# Patient Record
Sex: Male | Born: 1935
Health system: Southern US, Community
[De-identification: ages and names within clinical notes are randomized; demographics above are authoritative.]

## PROBLEM LIST (undated history)

## (undated) DIAGNOSIS — E119 Type 2 diabetes mellitus without complications: Secondary | ICD-10-CM

## (undated) DIAGNOSIS — N400 Enlarged prostate without lower urinary tract symptoms: Secondary | ICD-10-CM

## (undated) DIAGNOSIS — Z8719 Personal history of other diseases of the digestive system: Secondary | ICD-10-CM

## (undated) DIAGNOSIS — K5790 Diverticulosis of intestine, part unspecified, without perforation or abscess without bleeding: Secondary | ICD-10-CM

## (undated) DIAGNOSIS — E1140 Type 2 diabetes mellitus with diabetic neuropathy, unspecified: Secondary | ICD-10-CM

## (undated) DIAGNOSIS — E785 Hyperlipidemia, unspecified: Secondary | ICD-10-CM

## (undated) DIAGNOSIS — T7840XA Allergy, unspecified, initial encounter: Secondary | ICD-10-CM

## (undated) DIAGNOSIS — N189 Chronic kidney disease, unspecified: Secondary | ICD-10-CM

## (undated) DIAGNOSIS — K219 Gastro-esophageal reflux disease without esophagitis: Secondary | ICD-10-CM

## (undated) DIAGNOSIS — K56609 Unspecified intestinal obstruction, unspecified as to partial versus complete obstruction: Secondary | ICD-10-CM

## (undated) DIAGNOSIS — B351 Tinea unguium: Secondary | ICD-10-CM

## (undated) DIAGNOSIS — I10 Essential (primary) hypertension: Secondary | ICD-10-CM

## (undated) DIAGNOSIS — N399 Disorder of urinary system, unspecified: Secondary | ICD-10-CM

## (undated) DIAGNOSIS — K5730 Diverticulosis of large intestine without perforation or abscess without bleeding: Secondary | ICD-10-CM

## (undated) DIAGNOSIS — H409 Unspecified glaucoma: Secondary | ICD-10-CM

## (undated) DIAGNOSIS — R569 Unspecified convulsions: Secondary | ICD-10-CM

## (undated) DIAGNOSIS — K649 Unspecified hemorrhoids: Secondary | ICD-10-CM

## (undated) DIAGNOSIS — E7800 Pure hypercholesterolemia, unspecified: Secondary | ICD-10-CM

## (undated) DIAGNOSIS — I219 Acute myocardial infarction, unspecified: Secondary | ICD-10-CM

## (undated) DIAGNOSIS — E669 Obesity, unspecified: Secondary | ICD-10-CM

## (undated) HISTORY — DX: Pure hypercholesterolemia, unspecified: E78.00

## (undated) HISTORY — PX: HERNIA REPAIR: SHX51

## (undated) HISTORY — DX: Disorder of urinary system, unspecified: N39.9

## (undated) HISTORY — DX: Allergy, unspecified, initial encounter: T78.40XA

## (undated) HISTORY — DX: Type 2 diabetes mellitus without complications: E11.9

## (undated) HISTORY — PX: APPENDECTOMY: SHX54

## (undated) HISTORY — PX: EYE SURGERY: SHX253

## (undated) HISTORY — PX: COLONOSCOPY W/ POLYPECTOMY: SHX1380

## (undated) HISTORY — PX: INGUINAL HERNIA REPAIR: SHX194

## (undated) HISTORY — PX: GLAUCOMA SURGERY: SHX656

## (undated) HISTORY — PX: COLON SURGERY: SHX602

## (undated) HISTORY — PX: HEMORROIDECTOMY: SUR656

## (undated) HISTORY — PX: RETINAL DETACHMENT SURGERY: SHX105

## (undated) HISTORY — DX: Essential (primary) hypertension: I10

## (undated) HISTORY — DX: Diverticulosis of large intestine without perforation or abscess without bleeding: K57.30

## (undated) HISTORY — DX: Unspecified intestinal obstruction, unspecified as to partial versus complete obstruction: K56.609

## (undated) HISTORY — PX: REPLACEMENT TOTAL KNEE: SUR1224

## (undated) HISTORY — PX: TONSILLECTOMY: SUR1361

---

## 1979-02-14 DIAGNOSIS — K5730 Diverticulosis of large intestine without perforation or abscess without bleeding: Secondary | ICD-10-CM

## 1979-02-14 HISTORY — DX: Diverticulosis of large intestine without perforation or abscess without bleeding: K57.30

## 1988-06-15 HISTORY — PX: LARYNX SURGERY: SHX692

## 1989-02-13 DIAGNOSIS — K56609 Unspecified intestinal obstruction, unspecified as to partial versus complete obstruction: Secondary | ICD-10-CM

## 1989-02-13 HISTORY — DX: Unspecified intestinal obstruction, unspecified as to partial versus complete obstruction: K56.609

## 2004-06-27 ENCOUNTER — Ambulatory Visit: Payer: Self-pay

## 2005-07-09 ENCOUNTER — Inpatient Hospital Stay: Payer: Self-pay | Admitting: Gastroenterology

## 2005-08-01 ENCOUNTER — Ambulatory Visit: Payer: Self-pay | Admitting: Gastroenterology

## 2007-02-03 DIAGNOSIS — I10 Essential (primary) hypertension: Secondary | ICD-10-CM | POA: Insufficient documentation

## 2007-02-03 DIAGNOSIS — E1159 Type 2 diabetes mellitus with other circulatory complications: Secondary | ICD-10-CM | POA: Insufficient documentation

## 2007-09-13 ENCOUNTER — Ambulatory Visit: Payer: Self-pay | Admitting: Family Medicine

## 2008-02-07 DIAGNOSIS — K2100 Gastro-esophageal reflux disease with esophagitis, without bleeding: Secondary | ICD-10-CM | POA: Insufficient documentation

## 2008-09-24 ENCOUNTER — Ambulatory Visit: Payer: Self-pay | Admitting: Gastroenterology

## 2010-03-18 DIAGNOSIS — M8580 Other specified disorders of bone density and structure, unspecified site: Secondary | ICD-10-CM | POA: Insufficient documentation

## 2010-03-19 ENCOUNTER — Ambulatory Visit: Payer: Self-pay | Admitting: Family Medicine

## 2011-06-16 HISTORY — PX: OTHER SURGICAL HISTORY: SHX169

## 2011-06-16 HISTORY — PX: URETER SURGERY: SHX823

## 2011-06-16 HISTORY — PX: BLADDER DIVERTICULECTOMY: SHX1235

## 2011-08-31 DIAGNOSIS — M461 Sacroiliitis, not elsewhere classified: Secondary | ICD-10-CM | POA: Diagnosis not present

## 2011-08-31 DIAGNOSIS — IMO0002 Reserved for concepts with insufficient information to code with codable children: Secondary | ICD-10-CM | POA: Diagnosis not present

## 2011-09-07 DIAGNOSIS — N138 Other obstructive and reflux uropathy: Secondary | ICD-10-CM | POA: Diagnosis not present

## 2011-09-07 DIAGNOSIS — N323 Diverticulum of bladder: Secondary | ICD-10-CM | POA: Diagnosis not present

## 2011-09-07 DIAGNOSIS — N401 Enlarged prostate with lower urinary tract symptoms: Secondary | ICD-10-CM | POA: Diagnosis not present

## 2011-09-07 DIAGNOSIS — R338 Other retention of urine: Secondary | ICD-10-CM | POA: Diagnosis not present

## 2011-09-18 ENCOUNTER — Ambulatory Visit: Payer: Self-pay | Admitting: Urology

## 2011-09-18 DIAGNOSIS — R935 Abnormal findings on diagnostic imaging of other abdominal regions, including retroperitoneum: Secondary | ICD-10-CM | POA: Diagnosis not present

## 2011-09-18 DIAGNOSIS — N323 Diverticulum of bladder: Secondary | ICD-10-CM | POA: Diagnosis not present

## 2011-09-18 DIAGNOSIS — N210 Calculus in bladder: Secondary | ICD-10-CM | POA: Diagnosis not present

## 2011-09-21 DIAGNOSIS — N138 Other obstructive and reflux uropathy: Secondary | ICD-10-CM | POA: Diagnosis not present

## 2011-09-21 DIAGNOSIS — R338 Other retention of urine: Secondary | ICD-10-CM | POA: Diagnosis not present

## 2011-09-21 DIAGNOSIS — N323 Diverticulum of bladder: Secondary | ICD-10-CM | POA: Diagnosis not present

## 2011-09-21 DIAGNOSIS — N401 Enlarged prostate with lower urinary tract symptoms: Secondary | ICD-10-CM | POA: Diagnosis not present

## 2011-09-24 ENCOUNTER — Ambulatory Visit: Payer: Self-pay | Admitting: Urology

## 2011-09-24 DIAGNOSIS — Z01810 Encounter for preprocedural cardiovascular examination: Secondary | ICD-10-CM | POA: Diagnosis not present

## 2011-09-24 DIAGNOSIS — I119 Hypertensive heart disease without heart failure: Secondary | ICD-10-CM | POA: Diagnosis not present

## 2011-09-24 DIAGNOSIS — N323 Diverticulum of bladder: Secondary | ICD-10-CM | POA: Diagnosis not present

## 2011-09-24 DIAGNOSIS — Z01812 Encounter for preprocedural laboratory examination: Secondary | ICD-10-CM | POA: Diagnosis not present

## 2011-09-24 LAB — BASIC METABOLIC PANEL
Anion Gap: 10 (ref 7–16)
Chloride: 97 mmol/L — ABNORMAL LOW (ref 98–107)
Co2: 27 mmol/L (ref 21–32)
Creatinine: 0.72 mg/dL (ref 0.60–1.30)
EGFR (African American): >60
Glucose: 474 mg/dL — ABNORMAL HIGH (ref 65–99)

## 2011-11-04 LAB — HM COLONOSCOPY: HM Colonoscopy: ABNORMAL

## 2011-11-10 ENCOUNTER — Ambulatory Visit: Payer: Self-pay | Admitting: Gastroenterology

## 2011-11-10 DIAGNOSIS — Z860100 Personal history of colon polyps, unspecified: Secondary | ICD-10-CM | POA: Diagnosis not present

## 2011-11-10 DIAGNOSIS — E119 Type 2 diabetes mellitus without complications: Secondary | ICD-10-CM | POA: Diagnosis not present

## 2011-11-10 DIAGNOSIS — Z79899 Other long term (current) drug therapy: Secondary | ICD-10-CM | POA: Diagnosis not present

## 2011-11-10 DIAGNOSIS — K5730 Diverticulosis of large intestine without perforation or abscess without bleeding: Secondary | ICD-10-CM | POA: Diagnosis not present

## 2011-11-10 DIAGNOSIS — Z980 Intestinal bypass and anastomosis status: Secondary | ICD-10-CM | POA: Diagnosis not present

## 2011-11-10 DIAGNOSIS — K219 Gastro-esophageal reflux disease without esophagitis: Secondary | ICD-10-CM | POA: Diagnosis not present

## 2011-11-10 DIAGNOSIS — H409 Unspecified glaucoma: Secondary | ICD-10-CM | POA: Diagnosis not present

## 2011-11-10 DIAGNOSIS — Z800 Family history of malignant neoplasm of digestive organs: Secondary | ICD-10-CM | POA: Diagnosis not present

## 2011-11-10 DIAGNOSIS — Z09 Encounter for follow-up examination after completed treatment for conditions other than malignant neoplasm: Secondary | ICD-10-CM | POA: Diagnosis not present

## 2011-11-10 DIAGNOSIS — I10 Essential (primary) hypertension: Secondary | ICD-10-CM | POA: Diagnosis not present

## 2011-11-10 DIAGNOSIS — E7800 Pure hypercholesterolemia, unspecified: Secondary | ICD-10-CM | POA: Diagnosis not present

## 2011-11-20 DIAGNOSIS — IMO0001 Reserved for inherently not codable concepts without codable children: Secondary | ICD-10-CM | POA: Diagnosis not present

## 2011-11-20 DIAGNOSIS — I10 Essential (primary) hypertension: Secondary | ICD-10-CM | POA: Diagnosis not present

## 2011-11-20 DIAGNOSIS — E7800 Pure hypercholesterolemia, unspecified: Secondary | ICD-10-CM | POA: Diagnosis not present

## 2011-12-04 DIAGNOSIS — E119 Type 2 diabetes mellitus without complications: Secondary | ICD-10-CM | POA: Diagnosis not present

## 2012-01-13 DIAGNOSIS — H2700 Aphakia, unspecified eye: Secondary | ICD-10-CM | POA: Diagnosis not present

## 2012-01-13 DIAGNOSIS — H4052X3 Glaucoma secondary to other eye disorders, left eye, severe stage: Secondary | ICD-10-CM | POA: Insufficient documentation

## 2012-01-13 DIAGNOSIS — H4050X0 Glaucoma secondary to other eye disorders, unspecified eye, stage unspecified: Secondary | ICD-10-CM | POA: Diagnosis not present

## 2012-02-08 DIAGNOSIS — R490 Dysphonia: Secondary | ICD-10-CM | POA: Insufficient documentation

## 2012-02-08 DIAGNOSIS — J387 Other diseases of larynx: Secondary | ICD-10-CM | POA: Diagnosis not present

## 2012-02-08 DIAGNOSIS — K219 Gastro-esophageal reflux disease without esophagitis: Secondary | ICD-10-CM | POA: Diagnosis not present

## 2012-02-10 ENCOUNTER — Ambulatory Visit: Payer: Self-pay | Admitting: Urology

## 2012-02-12 DIAGNOSIS — N323 Diverticulum of bladder: Secondary | ICD-10-CM | POA: Insufficient documentation

## 2012-02-16 ENCOUNTER — Inpatient Hospital Stay: Payer: Self-pay | Admitting: Urology

## 2012-02-16 DIAGNOSIS — I10 Essential (primary) hypertension: Secondary | ICD-10-CM | POA: Diagnosis not present

## 2012-02-16 DIAGNOSIS — Z79899 Other long term (current) drug therapy: Secondary | ICD-10-CM | POA: Diagnosis not present

## 2012-02-16 DIAGNOSIS — H409 Unspecified glaucoma: Secondary | ICD-10-CM | POA: Diagnosis present

## 2012-02-16 DIAGNOSIS — K219 Gastro-esophageal reflux disease without esophagitis: Secondary | ICD-10-CM | POA: Diagnosis present

## 2012-02-16 DIAGNOSIS — Z9049 Acquired absence of other specified parts of digestive tract: Secondary | ICD-10-CM | POA: Diagnosis not present

## 2012-02-16 DIAGNOSIS — Z881 Allergy status to other antibiotic agents status: Secondary | ICD-10-CM | POA: Diagnosis not present

## 2012-02-16 DIAGNOSIS — N323 Diverticulum of bladder: Secondary | ICD-10-CM | POA: Diagnosis not present

## 2012-02-16 DIAGNOSIS — Z880 Allergy status to penicillin: Secondary | ICD-10-CM | POA: Diagnosis not present

## 2012-02-16 DIAGNOSIS — E119 Type 2 diabetes mellitus without complications: Secondary | ICD-10-CM | POA: Diagnosis not present

## 2012-02-16 DIAGNOSIS — Z885 Allergy status to narcotic agent status: Secondary | ICD-10-CM | POA: Diagnosis not present

## 2012-02-16 DIAGNOSIS — N400 Enlarged prostate without lower urinary tract symptoms: Secondary | ICD-10-CM | POA: Diagnosis present

## 2012-02-17 LAB — CBC WITH DIFFERENTIAL/PLATELET
Basophil #: 0 10*3/uL (ref 0.0–0.1)
Eosinophil %: 2.3 %
HCT: 35.8 % — ABNORMAL LOW (ref 40.0–52.0)
HGB: 12.2 g/dL — ABNORMAL LOW (ref 13.0–18.0)
Lymphocyte #: 1.1 10*3/uL (ref 1.0–3.6)
MCH: 35 pg — ABNORMAL HIGH (ref 26.0–34.0)
MCV: 103 fL — ABNORMAL HIGH (ref 80–100)
Monocyte #: 0.7 x10 3/mm (ref 0.2–1.0)
Monocyte %: 11 %
Neutrophil #: 4.8 10*3/uL (ref 1.4–6.5)
Neutrophil %: 70.7 %
Platelet: 124 10*3/uL — ABNORMAL LOW (ref 150–440)
RDW: 12.7 % (ref 11.5–14.5)

## 2012-02-17 LAB — MAGNESIUM: Magnesium: 1.6 mg/dL — ABNORMAL LOW

## 2012-02-17 LAB — BASIC METABOLIC PANEL
Calcium, Total: 7.6 mg/dL — ABNORMAL LOW (ref 8.5–10.1)
Chloride: 109 mmol/L — ABNORMAL HIGH (ref 98–107)
Co2: 30 mmol/L (ref 21–32)
Creatinine: 0.82 mg/dL (ref 0.60–1.30)
Potassium: 3.8 mmol/L (ref 3.5–5.1)
Sodium: 143 mmol/L (ref 136–145)

## 2012-02-19 LAB — PATHOLOGY REPORT

## 2012-03-03 DIAGNOSIS — Z23 Encounter for immunization: Secondary | ICD-10-CM | POA: Diagnosis not present

## 2012-03-08 DIAGNOSIS — G5600 Carpal tunnel syndrome, unspecified upper limb: Secondary | ICD-10-CM | POA: Diagnosis not present

## 2012-03-08 DIAGNOSIS — IMO0002 Reserved for concepts with insufficient information to code with codable children: Secondary | ICD-10-CM | POA: Diagnosis not present

## 2012-03-16 DIAGNOSIS — R338 Other retention of urine: Secondary | ICD-10-CM | POA: Diagnosis not present

## 2012-03-16 DIAGNOSIS — N390 Urinary tract infection, site not specified: Secondary | ICD-10-CM | POA: Diagnosis not present

## 2012-03-16 DIAGNOSIS — N138 Other obstructive and reflux uropathy: Secondary | ICD-10-CM | POA: Diagnosis not present

## 2012-03-22 DIAGNOSIS — G5600 Carpal tunnel syndrome, unspecified upper limb: Secondary | ICD-10-CM | POA: Diagnosis not present

## 2012-03-22 DIAGNOSIS — IMO0002 Reserved for concepts with insufficient information to code with codable children: Secondary | ICD-10-CM | POA: Diagnosis not present

## 2012-04-04 DIAGNOSIS — E119 Type 2 diabetes mellitus without complications: Secondary | ICD-10-CM | POA: Diagnosis not present

## 2012-04-04 DIAGNOSIS — T190XXA Foreign body in urethra, initial encounter: Secondary | ICD-10-CM | POA: Diagnosis not present

## 2012-04-04 DIAGNOSIS — N323 Diverticulum of bladder: Secondary | ICD-10-CM | POA: Diagnosis not present

## 2012-04-04 DIAGNOSIS — E7800 Pure hypercholesterolemia, unspecified: Secondary | ICD-10-CM | POA: Diagnosis not present

## 2012-04-04 DIAGNOSIS — N390 Urinary tract infection, site not specified: Secondary | ICD-10-CM | POA: Diagnosis not present

## 2012-04-04 DIAGNOSIS — Z23 Encounter for immunization: Secondary | ICD-10-CM | POA: Diagnosis not present

## 2012-04-08 DIAGNOSIS — Z8669 Personal history of other diseases of the nervous system and sense organs: Secondary | ICD-10-CM | POA: Insufficient documentation

## 2012-04-08 DIAGNOSIS — H31019 Macula scars of posterior pole (postinflammatory) (post-traumatic), unspecified eye: Secondary | ICD-10-CM | POA: Diagnosis not present

## 2012-04-08 DIAGNOSIS — Z961 Presence of intraocular lens: Secondary | ICD-10-CM | POA: Insufficient documentation

## 2012-04-08 DIAGNOSIS — H4011X0 Primary open-angle glaucoma, stage unspecified: Secondary | ICD-10-CM | POA: Diagnosis not present

## 2012-04-08 DIAGNOSIS — H5231 Anisometropia: Secondary | ICD-10-CM | POA: Diagnosis not present

## 2012-04-08 DIAGNOSIS — H2640 Unspecified secondary cataract: Secondary | ICD-10-CM | POA: Diagnosis not present

## 2012-04-08 DIAGNOSIS — H4050X0 Glaucoma secondary to other eye disorders, unspecified eye, stage unspecified: Secondary | ICD-10-CM | POA: Diagnosis not present

## 2012-04-08 DIAGNOSIS — H2700 Aphakia, unspecified eye: Secondary | ICD-10-CM | POA: Diagnosis not present

## 2012-04-08 DIAGNOSIS — H11449 Conjunctival cysts, unspecified eye: Secondary | ICD-10-CM | POA: Diagnosis not present

## 2012-04-08 DIAGNOSIS — H409 Unspecified glaucoma: Secondary | ICD-10-CM | POA: Diagnosis not present

## 2012-04-08 HISTORY — DX: Personal history of other diseases of the nervous system and sense organs: Z86.69

## 2012-04-11 ENCOUNTER — Ambulatory Visit: Payer: Self-pay | Admitting: Specialist

## 2012-04-11 DIAGNOSIS — G5600 Carpal tunnel syndrome, unspecified upper limb: Secondary | ICD-10-CM | POA: Diagnosis not present

## 2012-04-11 DIAGNOSIS — Z01812 Encounter for preprocedural laboratory examination: Secondary | ICD-10-CM | POA: Diagnosis not present

## 2012-04-11 LAB — BASIC METABOLIC PANEL
Anion Gap: 7 (ref 7–16)
BUN: 17 mg/dL (ref 7–18)
Calcium, Total: 8.7 mg/dL (ref 8.5–10.1)
Creatinine: 0.58 mg/dL — ABNORMAL LOW (ref 0.60–1.30)
EGFR (African American): >60
EGFR (Non-African Amer.): >60
Glucose: 87 mg/dL (ref 65–99)
Potassium: 3.7 mmol/L (ref 3.5–5.1)
Sodium: 142 mmol/L (ref 136–145)

## 2012-04-21 ENCOUNTER — Ambulatory Visit: Payer: Self-pay | Admitting: Specialist

## 2012-04-21 DIAGNOSIS — Z885 Allergy status to narcotic agent status: Secondary | ICD-10-CM | POA: Diagnosis not present

## 2012-04-21 DIAGNOSIS — M129 Arthropathy, unspecified: Secondary | ICD-10-CM | POA: Diagnosis not present

## 2012-04-21 DIAGNOSIS — K219 Gastro-esophageal reflux disease without esophagitis: Secondary | ICD-10-CM | POA: Diagnosis not present

## 2012-04-21 DIAGNOSIS — E119 Type 2 diabetes mellitus without complications: Secondary | ICD-10-CM | POA: Diagnosis not present

## 2012-04-21 DIAGNOSIS — H4089 Other specified glaucoma: Secondary | ICD-10-CM | POA: Diagnosis not present

## 2012-04-21 DIAGNOSIS — Z9049 Acquired absence of other specified parts of digestive tract: Secondary | ICD-10-CM | POA: Diagnosis not present

## 2012-04-21 DIAGNOSIS — Z880 Allergy status to penicillin: Secondary | ICD-10-CM | POA: Diagnosis not present

## 2012-04-21 DIAGNOSIS — G5600 Carpal tunnel syndrome, unspecified upper limb: Secondary | ICD-10-CM | POA: Diagnosis not present

## 2012-04-21 DIAGNOSIS — Z79899 Other long term (current) drug therapy: Secondary | ICD-10-CM | POA: Diagnosis not present

## 2012-04-21 DIAGNOSIS — Z888 Allergy status to other drugs, medicaments and biological substances status: Secondary | ICD-10-CM | POA: Diagnosis not present

## 2012-04-21 DIAGNOSIS — I10 Essential (primary) hypertension: Secondary | ICD-10-CM | POA: Diagnosis not present

## 2012-04-21 DIAGNOSIS — D649 Anemia, unspecified: Secondary | ICD-10-CM | POA: Diagnosis not present

## 2012-04-28 ENCOUNTER — Ambulatory Visit: Payer: Self-pay | Admitting: Urology

## 2012-04-28 DIAGNOSIS — N323 Diverticulum of bladder: Secondary | ICD-10-CM | POA: Diagnosis not present

## 2012-04-28 DIAGNOSIS — N401 Enlarged prostate with lower urinary tract symptoms: Secondary | ICD-10-CM | POA: Diagnosis not present

## 2012-04-28 DIAGNOSIS — E119 Type 2 diabetes mellitus without complications: Secondary | ICD-10-CM | POA: Diagnosis not present

## 2012-04-28 DIAGNOSIS — N138 Other obstructive and reflux uropathy: Secondary | ICD-10-CM | POA: Diagnosis not present

## 2012-04-28 DIAGNOSIS — R338 Other retention of urine: Secondary | ICD-10-CM | POA: Diagnosis not present

## 2012-04-28 LAB — CREATININE, SERUM: EGFR (African American): >60

## 2012-04-28 LAB — BUN: BUN: 15 mg/dL (ref 7–18)

## 2012-05-02 DIAGNOSIS — R339 Retention of urine, unspecified: Secondary | ICD-10-CM | POA: Diagnosis not present

## 2012-05-02 DIAGNOSIS — G5600 Carpal tunnel syndrome, unspecified upper limb: Secondary | ICD-10-CM | POA: Diagnosis not present

## 2012-05-05 ENCOUNTER — Ambulatory Visit: Payer: Self-pay | Admitting: Specialist

## 2012-05-05 DIAGNOSIS — Z888 Allergy status to other drugs, medicaments and biological substances status: Secondary | ICD-10-CM | POA: Diagnosis not present

## 2012-05-05 DIAGNOSIS — D649 Anemia, unspecified: Secondary | ICD-10-CM | POA: Diagnosis not present

## 2012-05-05 DIAGNOSIS — Z881 Allergy status to other antibiotic agents status: Secondary | ICD-10-CM | POA: Diagnosis not present

## 2012-05-05 DIAGNOSIS — I10 Essential (primary) hypertension: Secondary | ICD-10-CM | POA: Diagnosis not present

## 2012-05-05 DIAGNOSIS — K219 Gastro-esophageal reflux disease without esophagitis: Secondary | ICD-10-CM | POA: Diagnosis not present

## 2012-05-05 DIAGNOSIS — M129 Arthropathy, unspecified: Secondary | ICD-10-CM | POA: Diagnosis not present

## 2012-05-05 DIAGNOSIS — Z885 Allergy status to narcotic agent status: Secondary | ICD-10-CM | POA: Diagnosis not present

## 2012-05-05 DIAGNOSIS — G5600 Carpal tunnel syndrome, unspecified upper limb: Secondary | ICD-10-CM | POA: Diagnosis not present

## 2012-05-05 DIAGNOSIS — E119 Type 2 diabetes mellitus without complications: Secondary | ICD-10-CM | POA: Diagnosis not present

## 2012-05-05 DIAGNOSIS — Z880 Allergy status to penicillin: Secondary | ICD-10-CM | POA: Diagnosis not present

## 2012-05-05 DIAGNOSIS — H409 Unspecified glaucoma: Secondary | ICD-10-CM | POA: Diagnosis not present

## 2012-05-06 DIAGNOSIS — G5600 Carpal tunnel syndrome, unspecified upper limb: Secondary | ICD-10-CM | POA: Diagnosis not present

## 2012-05-19 DIAGNOSIS — G5600 Carpal tunnel syndrome, unspecified upper limb: Secondary | ICD-10-CM | POA: Diagnosis not present

## 2012-07-15 DIAGNOSIS — H4050X0 Glaucoma secondary to other eye disorders, unspecified eye, stage unspecified: Secondary | ICD-10-CM | POA: Diagnosis not present

## 2012-07-15 DIAGNOSIS — H353190 Nonexudative age-related macular degeneration, unspecified eye, stage unspecified: Secondary | ICD-10-CM | POA: Insufficient documentation

## 2012-07-15 DIAGNOSIS — Z8669 Personal history of other diseases of the nervous system and sense organs: Secondary | ICD-10-CM | POA: Diagnosis not present

## 2012-07-15 DIAGNOSIS — H2700 Aphakia, unspecified eye: Secondary | ICD-10-CM | POA: Diagnosis not present

## 2012-07-15 DIAGNOSIS — H409 Unspecified glaucoma: Secondary | ICD-10-CM | POA: Diagnosis not present

## 2012-08-09 DIAGNOSIS — E7800 Pure hypercholesterolemia, unspecified: Secondary | ICD-10-CM | POA: Diagnosis not present

## 2012-08-09 DIAGNOSIS — I10 Essential (primary) hypertension: Secondary | ICD-10-CM | POA: Diagnosis not present

## 2012-08-09 DIAGNOSIS — E119 Type 2 diabetes mellitus without complications: Secondary | ICD-10-CM | POA: Diagnosis not present

## 2012-08-12 DIAGNOSIS — H3530 Unspecified macular degeneration: Secondary | ICD-10-CM | POA: Diagnosis not present

## 2012-08-12 DIAGNOSIS — Z9889 Other specified postprocedural states: Secondary | ICD-10-CM | POA: Diagnosis not present

## 2012-08-12 DIAGNOSIS — H4420 Degenerative myopia, unspecified eye: Secondary | ICD-10-CM | POA: Diagnosis not present

## 2012-08-12 DIAGNOSIS — Z961 Presence of intraocular lens: Secondary | ICD-10-CM | POA: Diagnosis not present

## 2012-08-12 DIAGNOSIS — H4423 Degenerative myopia, bilateral: Secondary | ICD-10-CM | POA: Insufficient documentation

## 2012-08-12 DIAGNOSIS — H409 Unspecified glaucoma: Secondary | ICD-10-CM | POA: Diagnosis not present

## 2012-08-12 DIAGNOSIS — H2700 Aphakia, unspecified eye: Secondary | ICD-10-CM | POA: Diagnosis not present

## 2012-08-12 DIAGNOSIS — H4011X0 Primary open-angle glaucoma, stage unspecified: Secondary | ICD-10-CM | POA: Diagnosis not present

## 2012-08-24 DIAGNOSIS — N401 Enlarged prostate with lower urinary tract symptoms: Secondary | ICD-10-CM | POA: Diagnosis not present

## 2012-08-24 DIAGNOSIS — N323 Diverticulum of bladder: Secondary | ICD-10-CM | POA: Diagnosis not present

## 2012-08-24 DIAGNOSIS — R338 Other retention of urine: Secondary | ICD-10-CM | POA: Diagnosis not present

## 2012-09-19 DIAGNOSIS — R338 Other retention of urine: Secondary | ICD-10-CM | POA: Diagnosis not present

## 2012-10-14 ENCOUNTER — Ambulatory Visit: Payer: Self-pay | Admitting: Family Medicine

## 2012-10-14 DIAGNOSIS — J84112 Idiopathic pulmonary fibrosis: Secondary | ICD-10-CM | POA: Diagnosis not present

## 2012-10-14 DIAGNOSIS — J8410 Pulmonary fibrosis, unspecified: Secondary | ICD-10-CM | POA: Diagnosis not present

## 2012-10-25 DIAGNOSIS — H4420 Degenerative myopia, unspecified eye: Secondary | ICD-10-CM | POA: Diagnosis not present

## 2012-10-25 DIAGNOSIS — Z961 Presence of intraocular lens: Secondary | ICD-10-CM | POA: Diagnosis not present

## 2012-10-25 DIAGNOSIS — H5231 Anisometropia: Secondary | ICD-10-CM | POA: Diagnosis not present

## 2012-10-25 DIAGNOSIS — Z8669 Personal history of other diseases of the nervous system and sense organs: Secondary | ICD-10-CM | POA: Diagnosis not present

## 2012-10-25 DIAGNOSIS — H2700 Aphakia, unspecified eye: Secondary | ICD-10-CM | POA: Diagnosis not present

## 2012-10-25 DIAGNOSIS — H4050X0 Glaucoma secondary to other eye disorders, unspecified eye, stage unspecified: Secondary | ICD-10-CM | POA: Diagnosis not present

## 2012-11-03 DIAGNOSIS — R338 Other retention of urine: Secondary | ICD-10-CM | POA: Diagnosis not present

## 2012-11-03 DIAGNOSIS — N401 Enlarged prostate with lower urinary tract symptoms: Secondary | ICD-10-CM | POA: Insufficient documentation

## 2012-11-03 DIAGNOSIS — N400 Enlarged prostate without lower urinary tract symptoms: Secondary | ICD-10-CM | POA: Insufficient documentation

## 2012-12-12 DIAGNOSIS — I10 Essential (primary) hypertension: Secondary | ICD-10-CM | POA: Diagnosis not present

## 2012-12-12 DIAGNOSIS — E119 Type 2 diabetes mellitus without complications: Secondary | ICD-10-CM | POA: Diagnosis not present

## 2012-12-12 DIAGNOSIS — E7800 Pure hypercholesterolemia, unspecified: Secondary | ICD-10-CM | POA: Diagnosis not present

## 2013-01-12 ENCOUNTER — Encounter: Payer: Self-pay | Admitting: General Surgery

## 2013-01-12 ENCOUNTER — Ambulatory Visit (INDEPENDENT_AMBULATORY_CARE_PROVIDER_SITE_OTHER): Payer: Medicare Other | Admitting: General Surgery

## 2013-01-12 VITALS — BP 144/82 | HR 58 | Resp 12 | Ht 75.0 in | Wt 231.0 lb

## 2013-01-12 DIAGNOSIS — K439 Ventral hernia without obstruction or gangrene: Secondary | ICD-10-CM

## 2013-01-12 NOTE — Patient Instructions (Addendum)
Hernia Repair with Laparoscope A hernia occurs when an internal organ pushes out through a weak spot in the belly (abdominal) wall muscles. Hernias most commonly occur in the groin and around the navel. Hernias can also occur through a cut by the surgeon (incision) after an abdominal operation. A hernia may be caused by:  Lifting heavy objects.  Prolonged coughing.  Straining to move your bowels. Hernias can often be pushed back into place (reduced). Most hernias tend to get worse over time. Problems occur when abdominal contents get stuck in the opening and the blood supply is blocked or impaired (incarcerated hernia). Because of these risks, you require surgery to repair the hernia. Your hernia will be repaired using a laparoscope. Laparoscopic surgery is a type of minimally invasive surgery. It does not involve making a typical surgical cut (incision) in the skin. A laparoscope is a telescope-like rod and lens system. It is usually connected to a video camera and a light source so your caregiver can clearly see the operative area. The instruments are inserted through  to  inch (5 mm or 10 mm) openings in the skin at specific locations. A working and viewing space is created by blowing a small amount of carbon dioxide gas into the abdominal cavity. The abdomen is essentially blown up like a balloon (insufflated). This elevates the abdominal wall above the internal organs like a dome. The carbon dioxide gas is common to the human body and can be absorbed by tissue and removed by the respiratory system. Once the repair is completed, the small incisions will be closed with either stitches (sutures) or staples (just like a paper stapler only this staple holds the skin together). LET YOUR CAREGIVERS KNOW ABOUT:  Allergies.  Medications taken including herbs, eye drops, over the counter medications, and creams.  Use of steroids (by mouth or creams).  Previous problems with anesthetics or  Novocaine.  Possibility of pregnancy, if this applies.  History of blood clots (thrombophlebitis).  History of bleeding or blood problems.  Previous surgery.  Other health problems. BEFORE THE PROCEDURE  Laparoscopy can be done either in a hospital or out-patient clinic. You may be given a mild sedative to help you relax before the procedure. Once in the operating room, you will be given a general anesthesia to make you sleep (unless you and your caregiver choose a different anesthetic).  AFTER THE PROCEDURE  After the procedure you will be watched in a recovery area. Depending on what type of hernia was repaired, you might be admitted to the hospital or you might go home the same day. With this procedure you may have less pain and scarring. This usually results in a quicker recovery and less risk of infection. HOME CARE INSTRUCTIONS   Bed rest is not required. You may continue your normal activities but avoid heavy lifting (more than 10 pounds) or straining.  Cough gently. If you are a smoker it is best to stop, as even the best hernia repair can break down with the continual strain of coughing.  Avoid driving until given the OK by your surgeon.  There are no dietary restrictions unless given otherwise.  TAKE ALL MEDICATIONS AS DIRECTED.  Only take over-the-counter or prescription medicines for pain, discomfort, or fever as directed by your caregiver. SEEK MEDICAL CARE IF:   There is increasing abdominal pain or pain in your incisions.  There is more bleeding from incisions, other than minimal spotting.  You feel light headed or faint.  You   develop an unexplained fever, chills, and/or an oral temperature above 102 F (38.9 C).  You have redness, swelling, or increasing pain in the wound.  Pus coming from wound.  A foul smell coming from the wound or dressings. SEEK IMMEDIATE MEDICAL CARE IF:   You develop a rash.  You have difficulty breathing.  You have any  allergic problems. MAKE SURE YOU:   Understand these instructions.  Will watch your condition.  Will get help right away if you are not doing well or get worse. Document Released: 06/01/2005 Document Revised: 08/24/2011 Document Reviewed: 05/01/2009 [facility] Patient Information 2014 [place], [place].  Patient has been scheduled for a CT abdomen without contrast (oral contrast only) at [facility] for 01-18-13 at 10:30 am (arrive 10:15 am). Prep: no solids 4 hours prior but patient may have clear liquids up until exam time, pick up prep kit, and take medication list. Patient verbalizes understanding.

## 2013-01-12 NOTE — Progress Notes (Signed)
Patient ID: Jeremy [name], male   DOB: [birth year], 77 y.o.   MRN: [id number]  Chief Complaint  Patient presents with  . Other    ventral hernia    HPI Jeremy [name] is a 77 y.o. male  Here for evaluation of ventral hernia. He had previously had a ventral repair done in the 1990's. This current area is above the repair, and has been there 6 months. He reports no pain, but he can feel the bowel under the skin.  HPI  Past Medical History  Diagnosis Date  . Small bowel obstruction 1990's  . Diabetes mellitus without complication   . Hypertension   . Diverticula, colon 1980's  . High cholesterol   . Urination disorder     Past Surgical History  Procedure Laterality Date  . Glaucoma surgery Left 1990's    [facility]  . Colon surgery Left 1980's  . Colonoscopy w/ polypectomy  1990's    Dr Okey Dupre  . Ureter surgery  2013    Dr Achilles Dunk  . Bladder diverticulectomy  2013  . Hemorroidectomy  1980's    [facility]  . Inguinal hernia repair Bilateral 1960's  . Larynx surgery  1990    3 times  . Hernia repair      [facility], Dr Okey Dupre    Family History  Problem Relation Age of Onset  . Diabetes Mother   . Cataracts Father   . Cataracts Mother   . Glaucoma Father   . Cancer Sister     gallbladder    Social History History  Substance Use Topics  . Smoking status: Never Smoker   . Smokeless tobacco: Never Used  . Alcohol Use: No    Allergies  Allergen Reactions  . Demerol (Meperidine) Other (See Comments)    hypotension  . Penicillins Rash    Current Outpatient Prescriptions  Medication Sig Dispense Refill  . Brimonidine Tartrate-Timolol (COMBIGAN OP) Apply to eye.      . brinzolamide (AZOPT) 1 % ophthalmic suspension 1 drop 2 (two) times daily.      Marland Kitchen esomeprazole (NEXIUM) 20 MG capsule Take 20 mg by mouth daily before breakfast.      . glimepiride (AMARYL) 2 MG tablet Take 2 mg by mouth daily before breakfast.      . metFORMIN (GLUCOPHAGE) 1000 MG tablet Take  1,000 mg by mouth 2 (two) times daily with a meal.      . ranitidine (ZANTAC) 150 MG tablet Take 150 mg by mouth at bedtime.      . rosuvastatin (CRESTOR) 20 MG tablet Take 20 mg by mouth daily.      . saxagliptin HCl (ONGLYZA) 5 MG TABS tablet Take 5 mg by mouth daily.      . tamsulosin (FLOMAX) 0.4 MG CAPS Take 0.4 mg by mouth 2 (two) times daily.       No current facility-administered medications for this visit.    Review of Systems Review of Systems  Constitutional: Negative.   Respiratory: Negative.   Cardiovascular: Negative.     Blood pressure 144/82, pulse 58, resp. rate 12, height 6\' 3"  (1.905 m), weight 231 lb (104.781 kg).  Physical Exam Physical Exam  Constitutional: He is oriented to person, place, and time. He appears well-developed and well-nourished.  Eyes: Conjunctivae are normal. No scleral icterus.  Cardiovascular: Normal rate, regular rhythm and normal heart sounds.   Pulmonary/Chest: Effort normal and breath sounds normal.  Abdominal: Soft. There is no tenderness. A hernia is  present. Hernia confirmed positive in the ventral area.    Neurological: He is alert and oriented to person, place, and time.    Data Reviewed none  Assessment    Ventral hernia. He has had prior repair in lower part with mesh. CT may help delineate extent of hernia.     Plan    Repair of hernia  electively.  Discussed with him.    Patient has been scheduled for a CT abdomen without contrast (oral contrast only) at [facility] for 01-18-13 at 10:30 am (arrive 10:15 am). Prep: no solids 4 hours prior but patient may have clear liquids up until exam time, pick up prep kit, and take medication list. Patient verbalizes understanding.   Kamylle Axelson G 01/13/2013, 6:01 AM

## 2013-01-13 ENCOUNTER — Encounter: Payer: Self-pay | Admitting: General Surgery

## 2013-01-18 ENCOUNTER — Ambulatory Visit: Payer: Self-pay | Admitting: General Surgery

## 2013-01-18 DIAGNOSIS — K469 Unspecified abdominal hernia without obstruction or gangrene: Secondary | ICD-10-CM | POA: Diagnosis not present

## 2013-01-18 DIAGNOSIS — J984 Other disorders of lung: Secondary | ICD-10-CM | POA: Diagnosis not present

## 2013-01-18 DIAGNOSIS — N281 Cyst of kidney, acquired: Secondary | ICD-10-CM | POA: Diagnosis not present

## 2013-01-18 DIAGNOSIS — K439 Ventral hernia without obstruction or gangrene: Secondary | ICD-10-CM | POA: Diagnosis not present

## 2013-01-18 DIAGNOSIS — K449 Diaphragmatic hernia without obstruction or gangrene: Secondary | ICD-10-CM | POA: Diagnosis not present

## 2013-01-26 ENCOUNTER — Telehealth: Payer: Self-pay | Admitting: *Deleted

## 2013-01-26 NOTE — Telephone Encounter (Signed)
Message copied by Nicholes Mango on Thu Jan 26, 2013 10:27 AM ------      Message from: Kieth Brightly      Created: Fri Jan 20, 2013  7:52 AM       CT reviewed and shows 2-3cm fasial defect with fatty herniation. Pt advised by telephone. He will return  sometime in the fall to schedule surgery. Aware to call if he has any pain,tenderness, n/v or other abd symptoms. ------

## 2013-01-26 NOTE — Telephone Encounter (Signed)
Message for patient to call the office.  Patient has been scheduled for surgery on 04-07-13. Pre-admit appointment has been scheduled for 03-14-13 at 9 am (per patient's request to have done prior to 03-15-13). He will also need a pre-op visit scheduled one to two weeks prior to surgery date per Dr. Evette Cristal.

## 2013-01-26 NOTE — Telephone Encounter (Signed)
Patient notified of surgery plans. He will call the office if he has further questions.

## 2013-02-07 DIAGNOSIS — J387 Other diseases of larynx: Secondary | ICD-10-CM | POA: Diagnosis not present

## 2013-02-07 DIAGNOSIS — K219 Gastro-esophageal reflux disease without esophagitis: Secondary | ICD-10-CM | POA: Diagnosis not present

## 2013-03-09 ENCOUNTER — Other Ambulatory Visit: Payer: Self-pay | Admitting: General Surgery

## 2013-03-09 DIAGNOSIS — K432 Incisional hernia without obstruction or gangrene: Secondary | ICD-10-CM

## 2013-03-14 ENCOUNTER — Telehealth: Payer: Self-pay | Admitting: General Surgery

## 2013-03-14 ENCOUNTER — Ambulatory Visit: Payer: Self-pay | Admitting: General Surgery

## 2013-03-14 DIAGNOSIS — Z01812 Encounter for preprocedural laboratory examination: Secondary | ICD-10-CM | POA: Diagnosis not present

## 2013-03-14 DIAGNOSIS — I10 Essential (primary) hypertension: Secondary | ICD-10-CM | POA: Diagnosis not present

## 2013-03-14 DIAGNOSIS — Z01810 Encounter for preprocedural cardiovascular examination: Secondary | ICD-10-CM | POA: Diagnosis not present

## 2013-03-14 DIAGNOSIS — K432 Incisional hernia without obstruction or gangrene: Secondary | ICD-10-CM | POA: Diagnosis not present

## 2013-03-14 LAB — BASIC METABOLIC PANEL
Anion Gap: 6 — ABNORMAL LOW (ref 7–16)
BUN: 17 mg/dL (ref 7–18)
EGFR (African American): >60
EGFR (Non-African Amer.): >60
Glucose: 180 mg/dL — ABNORMAL HIGH (ref 65–99)
Osmolality: 282 (ref 275–301)
Sodium: 138 mmol/L (ref 136–145)

## 2013-03-14 LAB — CBC WITH DIFFERENTIAL/PLATELET
Basophil #: 0 10*3/uL (ref 0.0–0.1)
Eosinophil %: 3.8 %
HGB: 14.1 g/dL (ref 13.0–18.0)
Lymphocyte #: 1.2 10*3/uL (ref 1.0–3.6)
Lymphocyte %: 29.1 %
MCH: 34.8 pg — ABNORMAL HIGH (ref 26.0–34.0)
MCHC: 35.3 g/dL (ref 32.0–36.0)
MCV: 99 fL (ref 80–100)
Monocyte #: 0.4 x10 3/mm (ref 0.2–1.0)
Monocyte %: 10.3 %
Neutrophil #: 2.4 10*3/uL (ref 1.4–6.5)
Neutrophil %: 56.2 %
Platelet: 136 10*3/uL — ABNORMAL LOW (ref 150–440)
RDW: 12.9 % (ref 11.5–14.5)
WBC: 4.3 10*3/uL (ref 3.8–10.6)

## 2013-03-14 NOTE — Telephone Encounter (Signed)
DIANE CALLED FROM PRE-ADMIT.SHE WANTED TO KNOW IF DR Evette Cristal STILL WANTED PT TO HAVE KEFZOL 2GRAMS,SINCE HE IS ALLERGIC TO PENICILLIN.HE INFORMED TO GO AHEAD WITH KEFZOL 2GRAMS.I CALLED DIANE BACK & L/M WITH PAT/MTH

## 2013-03-15 ENCOUNTER — Encounter: Payer: Self-pay | Admitting: General Surgery

## 2013-03-15 DIAGNOSIS — Z23 Encounter for immunization: Secondary | ICD-10-CM | POA: Diagnosis not present

## 2013-03-23 ENCOUNTER — Encounter: Payer: Self-pay | Admitting: General Surgery

## 2013-03-23 ENCOUNTER — Ambulatory Visit (INDEPENDENT_AMBULATORY_CARE_PROVIDER_SITE_OTHER): Payer: Medicare Other | Admitting: General Surgery

## 2013-03-23 VITALS — BP 118/70 | HR 78 | Resp 12 | Ht 75.0 in | Wt 238.0 lb

## 2013-03-23 DIAGNOSIS — K432 Incisional hernia without obstruction or gangrene: Secondary | ICD-10-CM | POA: Insufficient documentation

## 2013-03-23 HISTORY — DX: Incisional hernia without obstruction or gangrene: K43.2

## 2013-03-23 NOTE — Progress Notes (Signed)
Patient ID: Jeremy [name], male   DOB: [birth year], 77 y.o.   MRN: [id number]  Chief Complaint  Patient presents with  . Pre-op Exam    incisional hernia repair    HPI Jeremy [name] is a 77 y.o. male who presents for a pre op evaluation of an incisional hernia. The patient denies any new problems at this time.   HPI  Past Medical History  Diagnosis Date  . Small bowel obstruction 1990's  . Diabetes mellitus without complication   . Hypertension   . Diverticula, colon 1980's  . High cholesterol   . Urination disorder     Past Surgical History  Procedure Laterality Date  . Glaucoma surgery Left 1990's    [facility]  . Colon surgery Left 1980's  . Colonoscopy w/ polypectomy  1990's    Dr Jeremy [name]  . Ureter surgery  2013    Dr Jeremy [name]  . Bladder diverticulectomy  2013  . Hemorroidectomy  1980's    [facility]  . Inguinal hernia repair Bilateral 1960's  . Larynx surgery  1990    3 times  . Hernia repair      [facility], Dr Jeremy [name]    Family History  Problem Relation Age of Onset  . Diabetes Mother   . Cataracts Father   . Cataracts Mother   . Glaucoma Father   . Cancer Sister     gallbladder    Social History History  Substance Use Topics  . Smoking status: Never Smoker   . Smokeless tobacco: Never Used  . Alcohol Use: No    Allergies  Allergen Reactions  . Demerol [Meperidine] Other (See Comments)    hypotension  . Penicillins Rash    Current Outpatient Prescriptions  Medication Sig Dispense Refill  . Brimonidine Tartrate-Timolol (COMBIGAN OP) Apply to eye.      . brinzolamide (AZOPT) 1 % ophthalmic suspension 1 drop 2 (two) times daily.      Marland Kitchen esomeprazole (NEXIUM) 20 MG capsule Take 20 mg by mouth daily before breakfast.      . glimepiride (AMARYL) 2 MG tablet Take 2 mg by mouth daily before breakfast.      . metFORMIN (GLUCOPHAGE) 1000 MG tablet Take 1,000 mg by mouth 2 (two) times daily with a meal.      . ranitidine (ZANTAC) 150 MG tablet Take  150 mg by mouth at bedtime.      . rosuvastatin (CRESTOR) 20 MG tablet Take 20 mg by mouth daily.      . saxagliptin HCl (ONGLYZA) 5 MG TABS tablet Take 5 mg by mouth daily.      . tamsulosin (FLOMAX) 0.4 MG CAPS Take 0.4 mg by mouth 2 (two) times daily.       No current facility-administered medications for this visit.    Review of Systems Review of Systems  Constitutional: Negative.   Respiratory: Negative.   Cardiovascular: Negative.   Gastrointestinal: Negative.     Blood pressure 118/70, pulse 78, resp. rate 12, height 6\' 3"  (1.905 m), weight 238 lb (107.956 kg).  Physical Exam Physical Exam  Constitutional: He is oriented to person, place, and time. He appears well-developed and well-nourished.  Eyes: Conjunctivae are normal. No scleral icterus.  Neck: No thyromegaly present.  Cardiovascular: Normal rate, regular rhythm and normal heart sounds.   No murmur heard. Pulmonary/Chest: Effort normal and breath sounds normal.  Abdominal: Soft. Bowel sounds are normal. There is no tenderness. A hernia is present. Hernia confirmed positive in  the ventral area.    Lymphadenopathy:    He has no cervical adenopathy.  Neurological: He is alert and oriented to person, place, and time.  Skin: Skin is warm and dry.    Data Reviewed Prop labs-bs 180, platelets 136k. Will check prior labs from Jeremy [name] office  Assessment    Incisional hernia. Ok to proceed with lap/ repair as previously discussed.      Plan    Surgery scheduled for 10/24, Dr. Achilles [name] to do TUR at same time.        Aneesah Hernan G 03/23/2013, 6:43 PM

## 2013-03-23 NOTE — Patient Instructions (Signed)
Hernia, Surgical Repair A hernia occurs when an internal organ pushes out through a weak spot in the belly (abdominal) wall muscles. Hernias commonly occur in the groin and around the navel. Hernias often can be pushed back into place (reduced). Most hernias tend to get worse over time. Problems occur when abdominal contents get stuck in the opening (incarcerated hernia). The blood supply gets cut off (strangulated hernia). This is an emergency and needs surgery. Otherwise, hernia repair can be an elective procedure. This means you can schedule this at your convenience when an emergency is not present. Because complications can occur, if you decide to repair the hernia, it is best to do it soon. When it becomes an emergency procedure, there is increased risk of complications after surgery. CAUSES   Heavy lifting.  Obesity.  Prolonged coughing.  Straining to move your bowels.  Hernias can also occur through a cut (incision) by a surgeonafter an abdominal operation. HOME CARE INSTRUCTIONS Before the repair:  Bed rest is not required. You may continue your normal activities, but avoid heavy lifting (more than 10 pounds) or straining. Cough gently. If you are a smoker, it is best to stop. Even the best hernia repair can break down with the continual strain of coughing.  Do not wear anything tight over your hernia. Do not try to keep it in with an outside bandage or truss. These can damage abdominal contents if they are trapped in the hernia sac.  Eat a normal diet. Avoid constipation. Straining over long periods of time to have a bowel movement will increase hernia size. It also can breakdown repairs. If you cannot do this with diet alone, laxatives or stool softeners may be used. PRIOR TO SURGERY, SEEK IMMEDIATE MEDICAL CARE IF: You have problems (symptoms) of a trapped (incarcerated) hernia. Symptoms include:  An oral temperature above 102 F (38.9 C) develops, or as your caregiver  suggests.  Increasing abdominal pain.  Feeling sick to your stomach(nausea) and vomiting.  You stop passing gas or stool.  The hernia is stuck outside the abdomen, looks discolored, feels hard, or is tender.  You have any changes in your bowel habits or in the hernia that is unusual for you. LET YOUR CAREGIVERS KNOW ABOUT THE FOLLOWING:  Allergies.  Medications taken including herbs, eye drops, over the counter medications, and creams.  Use of steroids (by mouth or creams).  Family or personal history of problems with anesthetics or Novocaine.  Possibility of pregnancy, if this applies.  Personal history of blood clots (thrombophlebitis).  Family or personal history of bleeding or blood problems.  Previous surgery.  Other health problems. BEFORE THE PROCEDURE You should be present 1 hour prior to your procedure, or as directed by your caregiver.  AFTER THE PROCEDURE After surgery, you will be taken to the recovery area. A nurse will watch and check your progress there. Once you are awake, stable, and taking fluids well, you will be allowed to go home as long as there are no problems. Once home, an ice pack (wrapped in a light towel) applied to your operative site may help with discomfort. It may also keep the swelling down. Do not lift anything heavier than 10 pounds (4.55 kilograms). Take showers not baths. Do not drive while taking narcotics. Follow instructions as suggested by your caregiver.  SEEK IMMEDIATE MEDICAL CARE IF: After surgery:  There is redness, swelling, or increasing pain in the wound.  There is pus coming from the wound.  There is   drainage from a wound lasting longer than 1 day.  An unexplained oral temperature above 102 F (38.9 C) develops.  You notice a foul smell coming from the wound or dressing.  There is a breaking open of a wound (edged not staying together) after the sutures have been removed.  You notice increasing pain in the shoulders  (shoulder strap areas).  You develop dizzy episodes or fainting while standing.  You develop persistent nausea or vomiting.  You develop a rash.  You have difficulty breathing.  You develop any reaction or side effects to medications given. MAKE SURE YOU:   Understand these instructions.  Will watch your condition.  Will get help right away if you are not doing well or get worse. Document Released: 11/25/2000 Document Revised: 08/24/2011 Document Reviewed: 10/18/2007 [employer] Patient Information 2014 [employer].  

## 2013-03-24 DIAGNOSIS — N401 Enlarged prostate with lower urinary tract symptoms: Secondary | ICD-10-CM | POA: Diagnosis not present

## 2013-03-24 DIAGNOSIS — R338 Other retention of urine: Secondary | ICD-10-CM | POA: Diagnosis not present

## 2013-03-24 DIAGNOSIS — K439 Ventral hernia without obstruction or gangrene: Secondary | ICD-10-CM | POA: Insufficient documentation

## 2013-03-27 ENCOUNTER — Ambulatory Visit: Payer: Medicare Other | Admitting: General Surgery

## 2013-03-29 DIAGNOSIS — H4050X0 Glaucoma secondary to other eye disorders, unspecified eye, stage unspecified: Secondary | ICD-10-CM | POA: Diagnosis not present

## 2013-03-29 DIAGNOSIS — H353190 Nonexudative age-related macular degeneration, unspecified eye, stage unspecified: Secondary | ICD-10-CM | POA: Diagnosis not present

## 2013-03-29 DIAGNOSIS — H409 Unspecified glaucoma: Secondary | ICD-10-CM | POA: Diagnosis not present

## 2013-03-29 DIAGNOSIS — H2700 Aphakia, unspecified eye: Secondary | ICD-10-CM | POA: Diagnosis not present

## 2013-04-04 ENCOUNTER — Ambulatory Visit: Payer: Medicare Other | Admitting: General Surgery

## 2013-04-06 ENCOUNTER — Telehealth: Payer: Self-pay

## 2013-04-06 NOTE — Telephone Encounter (Signed)
Patient's surgery has been rescheduled to 04/13/13 at 2:00 pm at [facility] French Ana informed patient and he is agreeable to the plan. French Ana in OR notified of date change.

## 2013-04-13 ENCOUNTER — Ambulatory Visit: Payer: Self-pay | Admitting: Urology

## 2013-04-13 DIAGNOSIS — Z833 Family history of diabetes mellitus: Secondary | ICD-10-CM | POA: Diagnosis not present

## 2013-04-13 DIAGNOSIS — Z880 Allergy status to penicillin: Secondary | ICD-10-CM | POA: Diagnosis not present

## 2013-04-13 DIAGNOSIS — K660 Peritoneal adhesions (postprocedural) (postinfection): Secondary | ICD-10-CM | POA: Diagnosis not present

## 2013-04-13 DIAGNOSIS — R338 Other retention of urine: Secondary | ICD-10-CM | POA: Diagnosis not present

## 2013-04-13 DIAGNOSIS — N138 Other obstructive and reflux uropathy: Secondary | ICD-10-CM | POA: Diagnosis not present

## 2013-04-13 DIAGNOSIS — K219 Gastro-esophageal reflux disease without esophagitis: Secondary | ICD-10-CM | POA: Diagnosis not present

## 2013-04-13 DIAGNOSIS — M129 Arthropathy, unspecified: Secondary | ICD-10-CM | POA: Diagnosis not present

## 2013-04-13 DIAGNOSIS — Z885 Allergy status to narcotic agent status: Secondary | ICD-10-CM | POA: Diagnosis not present

## 2013-04-13 DIAGNOSIS — Z86010 Personal history of colonic polyps: Secondary | ICD-10-CM | POA: Diagnosis not present

## 2013-04-13 DIAGNOSIS — Z888 Allergy status to other drugs, medicaments and biological substances status: Secondary | ICD-10-CM | POA: Diagnosis not present

## 2013-04-13 DIAGNOSIS — H409 Unspecified glaucoma: Secondary | ICD-10-CM | POA: Diagnosis not present

## 2013-04-13 DIAGNOSIS — Z809 Family history of malignant neoplasm, unspecified: Secondary | ICD-10-CM | POA: Diagnosis not present

## 2013-04-13 DIAGNOSIS — Z79899 Other long term (current) drug therapy: Secondary | ICD-10-CM | POA: Diagnosis not present

## 2013-04-13 DIAGNOSIS — I10 Essential (primary) hypertension: Secondary | ICD-10-CM | POA: Diagnosis not present

## 2013-04-13 DIAGNOSIS — N401 Enlarged prostate with lower urinary tract symptoms: Secondary | ICD-10-CM | POA: Diagnosis not present

## 2013-04-13 DIAGNOSIS — K432 Incisional hernia without obstruction or gangrene: Secondary | ICD-10-CM | POA: Diagnosis not present

## 2013-04-13 DIAGNOSIS — E119 Type 2 diabetes mellitus without complications: Secondary | ICD-10-CM | POA: Diagnosis not present

## 2013-04-13 DIAGNOSIS — K439 Ventral hernia without obstruction or gangrene: Secondary | ICD-10-CM | POA: Diagnosis not present

## 2013-04-13 DIAGNOSIS — Z8249 Family history of ischemic heart disease and other diseases of the circulatory system: Secondary | ICD-10-CM | POA: Diagnosis not present

## 2013-04-14 ENCOUNTER — Encounter: Payer: Self-pay | Admitting: General Surgery

## 2013-04-24 ENCOUNTER — Ambulatory Visit (INDEPENDENT_AMBULATORY_CARE_PROVIDER_SITE_OTHER): Payer: Medicare Other | Admitting: General Surgery

## 2013-04-24 ENCOUNTER — Encounter: Payer: Self-pay | Admitting: General Surgery

## 2013-04-24 VITALS — BP 132/68 | HR 72 | Resp 14 | Ht 75.0 in | Wt 237.0 lb

## 2013-04-24 DIAGNOSIS — K432 Incisional hernia without obstruction or gangrene: Secondary | ICD-10-CM

## 2013-04-24 NOTE — Progress Notes (Signed)
This is a 77 year old male here today for his post op ventral hernia repair done on 04/13/13. This was a laparoscopic repair with use of Physio mesh.  Patient states he is doing well. Port sites healing well. No sign of infection. Abdomen is soft, non tender. Mild ecchymosis noted around port sites.  At hernia site in epigastrium palpable seroma noted in hernia sac. This should resolve in time.

## 2013-04-24 NOTE — Patient Instructions (Addendum)
Patient to return in one month. May increase activity slowly as tolerated. 

## 2013-04-26 DIAGNOSIS — R338 Other retention of urine: Secondary | ICD-10-CM | POA: Diagnosis not present

## 2013-05-04 DIAGNOSIS — I10 Essential (primary) hypertension: Secondary | ICD-10-CM | POA: Diagnosis not present

## 2013-05-04 DIAGNOSIS — E119 Type 2 diabetes mellitus without complications: Secondary | ICD-10-CM | POA: Diagnosis not present

## 2013-05-04 DIAGNOSIS — K439 Ventral hernia without obstruction or gangrene: Secondary | ICD-10-CM | POA: Diagnosis not present

## 2013-05-04 DIAGNOSIS — E7800 Pure hypercholesterolemia, unspecified: Secondary | ICD-10-CM | POA: Diagnosis not present

## 2013-05-05 DIAGNOSIS — E119 Type 2 diabetes mellitus without complications: Secondary | ICD-10-CM | POA: Diagnosis not present

## 2013-05-05 DIAGNOSIS — I10 Essential (primary) hypertension: Secondary | ICD-10-CM | POA: Diagnosis not present

## 2013-05-05 DIAGNOSIS — E7800 Pure hypercholesterolemia, unspecified: Secondary | ICD-10-CM | POA: Diagnosis not present

## 2013-05-16 DIAGNOSIS — E1149 Type 2 diabetes mellitus with other diabetic neurological complication: Secondary | ICD-10-CM | POA: Diagnosis not present

## 2013-05-16 DIAGNOSIS — E1142 Type 2 diabetes mellitus with diabetic polyneuropathy: Secondary | ICD-10-CM | POA: Diagnosis not present

## 2013-05-16 DIAGNOSIS — L851 Acquired keratosis [keratoderma] palmaris et plantaris: Secondary | ICD-10-CM | POA: Diagnosis not present

## 2013-05-16 DIAGNOSIS — B351 Tinea unguium: Secondary | ICD-10-CM | POA: Diagnosis not present

## 2013-05-30 ENCOUNTER — Ambulatory Visit: Payer: Medicare Other | Admitting: General Surgery

## 2013-06-01 ENCOUNTER — Ambulatory Visit (INDEPENDENT_AMBULATORY_CARE_PROVIDER_SITE_OTHER): Payer: Medicare Other | Admitting: General Surgery

## 2013-06-01 ENCOUNTER — Encounter: Payer: Self-pay | Admitting: General Surgery

## 2013-06-01 VITALS — BP 132/78 | HR 76 | Resp 14 | Ht 72.0 in | Wt 241.0 lb

## 2013-06-01 DIAGNOSIS — K432 Incisional hernia without obstruction or gangrene: Secondary | ICD-10-CM

## 2013-06-01 NOTE — Progress Notes (Signed)
This is a 76 year old male here today for his post op incisional hernia repair done on 04/13/13.   The port sites are well healed. Abdomen soft. no reoccurrence of hernia.

## 2013-06-01 NOTE — Patient Instructions (Signed)
Patient knows to call the office for new questions or concerns.

## 2013-06-02 ENCOUNTER — Encounter: Payer: Self-pay | Admitting: General Surgery

## 2013-08-07 DIAGNOSIS — Z0000 Encounter for general adult medical examination without abnormal findings: Secondary | ICD-10-CM | POA: Diagnosis not present

## 2013-08-07 DIAGNOSIS — Z1331 Encounter for screening for depression: Secondary | ICD-10-CM | POA: Diagnosis not present

## 2013-08-07 DIAGNOSIS — Z1211 Encounter for screening for malignant neoplasm of colon: Secondary | ICD-10-CM | POA: Diagnosis not present

## 2013-08-07 DIAGNOSIS — Z23 Encounter for immunization: Secondary | ICD-10-CM | POA: Diagnosis not present

## 2013-08-07 DIAGNOSIS — Z125 Encounter for screening for malignant neoplasm of prostate: Secondary | ICD-10-CM | POA: Diagnosis not present

## 2013-08-15 DIAGNOSIS — IMO0001 Reserved for inherently not codable concepts without codable children: Secondary | ICD-10-CM | POA: Diagnosis not present

## 2013-08-15 DIAGNOSIS — L851 Acquired keratosis [keratoderma] palmaris et plantaris: Secondary | ICD-10-CM | POA: Diagnosis not present

## 2013-08-15 DIAGNOSIS — G909 Disorder of the autonomic nervous system, unspecified: Secondary | ICD-10-CM | POA: Diagnosis not present

## 2013-08-15 DIAGNOSIS — E1149 Type 2 diabetes mellitus with other diabetic neurological complication: Secondary | ICD-10-CM | POA: Diagnosis not present

## 2013-08-15 DIAGNOSIS — B351 Tinea unguium: Secondary | ICD-10-CM | POA: Diagnosis not present

## 2013-09-04 DIAGNOSIS — I10 Essential (primary) hypertension: Secondary | ICD-10-CM | POA: Diagnosis not present

## 2013-09-04 DIAGNOSIS — E119 Type 2 diabetes mellitus without complications: Secondary | ICD-10-CM | POA: Diagnosis not present

## 2013-09-04 DIAGNOSIS — E7800 Pure hypercholesterolemia, unspecified: Secondary | ICD-10-CM | POA: Diagnosis not present

## 2013-09-14 DIAGNOSIS — R338 Other retention of urine: Secondary | ICD-10-CM | POA: Diagnosis not present

## 2013-09-14 DIAGNOSIS — N401 Enlarged prostate with lower urinary tract symptoms: Secondary | ICD-10-CM | POA: Diagnosis not present

## 2013-09-14 DIAGNOSIS — N323 Diverticulum of bladder: Secondary | ICD-10-CM | POA: Diagnosis not present

## 2013-09-27 DIAGNOSIS — H353190 Nonexudative age-related macular degeneration, unspecified eye, stage unspecified: Secondary | ICD-10-CM | POA: Diagnosis not present

## 2013-09-27 DIAGNOSIS — H4050X0 Glaucoma secondary to other eye disorders, unspecified eye, stage unspecified: Secondary | ICD-10-CM | POA: Diagnosis not present

## 2013-09-27 DIAGNOSIS — H3530 Unspecified macular degeneration: Secondary | ICD-10-CM | POA: Diagnosis not present

## 2013-09-27 DIAGNOSIS — H2700 Aphakia, unspecified eye: Secondary | ICD-10-CM | POA: Diagnosis not present

## 2013-11-20 DIAGNOSIS — B351 Tinea unguium: Secondary | ICD-10-CM | POA: Diagnosis not present

## 2013-11-20 DIAGNOSIS — L851 Acquired keratosis [keratoderma] palmaris et plantaris: Secondary | ICD-10-CM | POA: Diagnosis not present

## 2013-11-20 DIAGNOSIS — R609 Edema, unspecified: Secondary | ICD-10-CM | POA: Diagnosis not present

## 2013-11-20 DIAGNOSIS — M19079 Primary osteoarthritis, unspecified ankle and foot: Secondary | ICD-10-CM | POA: Diagnosis not present

## 2013-11-22 DIAGNOSIS — Z961 Presence of intraocular lens: Secondary | ICD-10-CM | POA: Diagnosis not present

## 2013-11-22 DIAGNOSIS — H3530 Unspecified macular degeneration: Secondary | ICD-10-CM | POA: Diagnosis not present

## 2013-11-22 DIAGNOSIS — Z8669 Personal history of other diseases of the nervous system and sense organs: Secondary | ICD-10-CM | POA: Diagnosis not present

## 2013-11-22 DIAGNOSIS — H2700 Aphakia, unspecified eye: Secondary | ICD-10-CM | POA: Diagnosis not present

## 2013-11-22 DIAGNOSIS — H4050X0 Glaucoma secondary to other eye disorders, unspecified eye, stage unspecified: Secondary | ICD-10-CM | POA: Diagnosis not present

## 2013-11-22 DIAGNOSIS — H353190 Nonexudative age-related macular degeneration, unspecified eye, stage unspecified: Secondary | ICD-10-CM | POA: Diagnosis not present

## 2014-01-05 DIAGNOSIS — IMO0001 Reserved for inherently not codable concepts without codable children: Secondary | ICD-10-CM | POA: Diagnosis not present

## 2014-01-16 DIAGNOSIS — A6920 Lyme disease, unspecified: Secondary | ICD-10-CM | POA: Diagnosis not present

## 2014-01-16 DIAGNOSIS — IMO0001 Reserved for inherently not codable concepts without codable children: Secondary | ICD-10-CM | POA: Diagnosis not present

## 2014-01-16 DIAGNOSIS — I10 Essential (primary) hypertension: Secondary | ICD-10-CM | POA: Diagnosis not present

## 2014-01-16 DIAGNOSIS — E7800 Pure hypercholesterolemia, unspecified: Secondary | ICD-10-CM | POA: Diagnosis not present

## 2014-01-18 DIAGNOSIS — E7800 Pure hypercholesterolemia, unspecified: Secondary | ICD-10-CM | POA: Diagnosis not present

## 2014-01-18 DIAGNOSIS — E119 Type 2 diabetes mellitus without complications: Secondary | ICD-10-CM | POA: Diagnosis not present

## 2014-01-18 DIAGNOSIS — A6920 Lyme disease, unspecified: Secondary | ICD-10-CM | POA: Diagnosis not present

## 2014-01-18 LAB — LIPID PANEL
CHOLESTEROL: 130 mg/dL (ref 0–200)
HDL: 40 mg/dL (ref 35–70)
LDL Cholesterol: 44 mg/dL
Triglycerides: 229 mg/dL — AB (ref 40–160)

## 2014-02-07 DIAGNOSIS — J383 Other diseases of vocal cords: Secondary | ICD-10-CM | POA: Insufficient documentation

## 2014-02-07 DIAGNOSIS — J387 Other diseases of larynx: Secondary | ICD-10-CM | POA: Diagnosis not present

## 2014-02-23 DIAGNOSIS — L851 Acquired keratosis [keratoderma] palmaris et plantaris: Secondary | ICD-10-CM | POA: Diagnosis not present

## 2014-02-23 DIAGNOSIS — E1149 Type 2 diabetes mellitus with other diabetic neurological complication: Secondary | ICD-10-CM | POA: Diagnosis not present

## 2014-02-23 DIAGNOSIS — B351 Tinea unguium: Secondary | ICD-10-CM | POA: Diagnosis not present

## 2014-02-23 DIAGNOSIS — E1142 Type 2 diabetes mellitus with diabetic polyneuropathy: Secondary | ICD-10-CM | POA: Diagnosis not present

## 2014-03-22 DIAGNOSIS — M7521 Bicipital tendinitis, right shoulder: Secondary | ICD-10-CM | POA: Diagnosis not present

## 2014-03-22 DIAGNOSIS — M7541 Impingement syndrome of right shoulder: Secondary | ICD-10-CM | POA: Diagnosis not present

## 2014-03-23 DIAGNOSIS — G500 Trigeminal neuralgia: Secondary | ICD-10-CM | POA: Diagnosis not present

## 2014-04-02 ENCOUNTER — Ambulatory Visit: Payer: Self-pay | Admitting: Otolaryngology

## 2014-04-02 DIAGNOSIS — R6884 Jaw pain: Secondary | ICD-10-CM | POA: Diagnosis not present

## 2014-04-02 DIAGNOSIS — R51 Headache: Secondary | ICD-10-CM | POA: Diagnosis not present

## 2014-04-04 DIAGNOSIS — H3530 Unspecified macular degeneration: Secondary | ICD-10-CM | POA: Diagnosis not present

## 2014-04-04 DIAGNOSIS — H4052X3 Glaucoma secondary to other eye disorders, left eye, severe stage: Secondary | ICD-10-CM | POA: Diagnosis not present

## 2014-04-04 DIAGNOSIS — H2702 Aphakia, left eye: Secondary | ICD-10-CM | POA: Diagnosis not present

## 2014-04-05 DIAGNOSIS — Z23 Encounter for immunization: Secondary | ICD-10-CM | POA: Diagnosis not present

## 2014-05-16 DIAGNOSIS — H3530 Unspecified macular degeneration: Secondary | ICD-10-CM | POA: Diagnosis not present

## 2014-05-16 DIAGNOSIS — H2702 Aphakia, left eye: Secondary | ICD-10-CM | POA: Diagnosis not present

## 2014-05-16 DIAGNOSIS — H4052X3 Glaucoma secondary to other eye disorders, left eye, severe stage: Secondary | ICD-10-CM | POA: Diagnosis not present

## 2014-05-24 DIAGNOSIS — I10 Essential (primary) hypertension: Secondary | ICD-10-CM | POA: Diagnosis not present

## 2014-05-24 DIAGNOSIS — E1129 Type 2 diabetes mellitus with other diabetic kidney complication: Secondary | ICD-10-CM | POA: Diagnosis not present

## 2014-05-24 DIAGNOSIS — E1165 Type 2 diabetes mellitus with hyperglycemia: Secondary | ICD-10-CM | POA: Diagnosis not present

## 2014-05-24 DIAGNOSIS — R809 Proteinuria, unspecified: Secondary | ICD-10-CM | POA: Diagnosis not present

## 2014-05-24 DIAGNOSIS — E780 Pure hypercholesterolemia: Secondary | ICD-10-CM | POA: Diagnosis not present

## 2014-05-24 DIAGNOSIS — Z8719 Personal history of other diseases of the digestive system: Secondary | ICD-10-CM | POA: Diagnosis not present

## 2014-05-24 LAB — HEMOGLOBIN A1C: Hgb A1c MFr Bld: 8.3 % — AB (ref 4.0–6.0)

## 2014-05-28 DIAGNOSIS — B351 Tinea unguium: Secondary | ICD-10-CM | POA: Diagnosis not present

## 2014-05-28 DIAGNOSIS — E1142 Type 2 diabetes mellitus with diabetic polyneuropathy: Secondary | ICD-10-CM | POA: Diagnosis not present

## 2014-05-28 DIAGNOSIS — L851 Acquired keratosis [keratoderma] palmaris et plantaris: Secondary | ICD-10-CM | POA: Diagnosis not present

## 2014-07-05 ENCOUNTER — Ambulatory Visit (INDEPENDENT_AMBULATORY_CARE_PROVIDER_SITE_OTHER): Payer: Medicare Other | Admitting: General Surgery

## 2014-07-05 ENCOUNTER — Encounter: Payer: Self-pay | Admitting: General Surgery

## 2014-07-05 VITALS — Ht 72.0 in | Wt 230.0 lb

## 2014-07-05 DIAGNOSIS — L820 Inflamed seborrheic keratosis: Secondary | ICD-10-CM | POA: Diagnosis not present

## 2014-07-05 DIAGNOSIS — L989 Disorder of the skin and subcutaneous tissue, unspecified: Secondary | ICD-10-CM | POA: Diagnosis not present

## 2014-07-05 DIAGNOSIS — L821 Other seborrheic keratosis: Secondary | ICD-10-CM | POA: Diagnosis not present

## 2014-07-05 NOTE — Patient Instructions (Signed)
Keep area clean 

## 2014-07-05 NOTE — Progress Notes (Signed)
Patient ID: Jeremy [name], male   DOB: July 30, 1935, 79 y.o.   MRN: [id number]  Chief Complaint  Patient presents with  . Other    right scalp lesion    HPI Jeremy [name] is a 79 y.o. male.  Here today for evaluation of a lesion at the hair line right scalp. He states the area bleeds occasionally.He also wants his left carotid checked. He hears a "swish" sound early in the mornings for about a week.   HPI  Past Medical History  Diagnosis Date  . Small bowel obstruction 1990's  . Diabetes mellitus without complication   . Hypertension   . Diverticula, colon 1980's  . High cholesterol   . Urination disorder     Past Surgical History  Procedure Laterality Date  . Glaucoma surgery Left [place]  . Colon surgery Left 1980's  . Colonoscopy w/ polypectomy  1990's    Dr Sharlet Salina  . Ureter surgery  2013    Dr Jacqlyn Larsen  . Bladder diverticulectomy  2013  . Hemorroidectomy  1980's    [facility]  . Inguinal hernia repair Bilateral 1960's  . Larynx surgery  1990    3 times  . Hernia repair      [facility], Dr Sharlet Salina    Family History  Problem Relation Age of Onset  . Diabetes Mother   . Cataracts Father   . Cataracts Mother   . Glaucoma Father   . Cancer Sister     gallbladder    Social History History  Substance Use Topics  . Smoking status: Never Smoker   . Smokeless tobacco: Never Used  . Alcohol Use: No    Allergies  Allergen Reactions  . Demerol [Meperidine] Other (See Comments)    hypotension  . Penicillins Rash    Current Outpatient Prescriptions  Medication Sig Dispense Refill  . dorzolamide (TRUSOPT) 2 % ophthalmic solution Place 1 drop into the left eye 2 (two) times daily.    Marland Kitchen esomeprazole (NEXIUM) 20 MG capsule Take 20 mg by mouth daily before breakfast.    . glimepiride (AMARYL) 2 MG tablet Take 2 mg by mouth daily before breakfast.    . metFORMIN (GLUCOPHAGE) 1000 MG tablet Take 1,000 mg by mouth 2 (two) times daily with a meal.     . ranitidine (ZANTAC) 150 MG tablet Take 150 mg by mouth at bedtime.    . rosuvastatin (CRESTOR) 20 MG tablet Take 20 mg by mouth daily.    . saxagliptin HCl (ONGLYZA) 5 MG TABS tablet Take 5 mg by mouth daily.    . tamsulosin (FLOMAX) 0.4 MG CAPS Take 0.4 mg by mouth 2 (two) times daily.     No current facility-administered medications for this visit.    Review of Systems Review of Systems  Constitutional: Negative.   Respiratory: Negative.   Cardiovascular: Negative.   Neurological: Negative for light-headedness and headaches.    Height 6' (1.829 m), weight 230 lb (104.327 kg).  Physical Exam Physical Exam  Constitutional: He is oriented to person, place, and time. He appears well-developed and well-nourished.  Eyes: Conjunctivae are normal. No scleral icterus.  Neck: Neck supple.  Cardiovascular: Normal rate, regular rhythm and normal heart sounds.   Lymphadenopathy:    He has no cervical adenopathy.  Neurological: He is alert and oriented to person, place, and time.  Skin: Skin is warm and dry.     8 mm raised red skin lesion poorly marginated on the  right forehead.    Data Reviewed    Assessment    Suspicious skin lesion right side forehead.     Plan    With consent excision perfprmed       Procedure: 5 ml of 1% xylocaine mixed with 0.5% marcaine instilled. Prepped with chloro Prep. Elliptical excision of the 65mm skin lesion performed with at least 64mm margin. Bleeding controlled with cautery. Skin closed with 5-0 Prolene vertical suture.Dressed with neosporin and gauze.  No immediate problems from procedure.  SANKAR,SEEPLAPUTHUR G 07/06/2014, 3:50 PM

## 2014-07-06 ENCOUNTER — Encounter: Payer: Self-pay | Admitting: General Surgery

## 2014-07-10 ENCOUNTER — Telehealth: Payer: Self-pay | Admitting: *Deleted

## 2014-07-10 NOTE — Telephone Encounter (Signed)
Notified patient as instructed, benign pathology seborrheic keratosis per Dr Jamal Collin, patient pleased. Discussed follow-up appointments, patient agrees

## 2014-07-12 ENCOUNTER — Ambulatory Visit (INDEPENDENT_AMBULATORY_CARE_PROVIDER_SITE_OTHER): Payer: Self-pay | Admitting: *Deleted

## 2014-07-12 DIAGNOSIS — L989 Disorder of the skin and subcutaneous tissue, unspecified: Secondary | ICD-10-CM

## 2014-07-12 NOTE — Patient Instructions (Signed)
Patient to return as needed. The patient is aware to call back for any questions or concerns. 

## 2014-07-12 NOTE — Progress Notes (Signed)
The sutures were removed and steri strips applied. Patient is aware of Path.

## 2014-09-19 DIAGNOSIS — H2702 Aphakia, left eye: Secondary | ICD-10-CM | POA: Diagnosis not present

## 2014-09-19 DIAGNOSIS — H4052X3 Glaucoma secondary to other eye disorders, left eye, severe stage: Secondary | ICD-10-CM | POA: Diagnosis not present

## 2014-09-19 DIAGNOSIS — H3531 Nonexudative age-related macular degeneration: Secondary | ICD-10-CM | POA: Diagnosis not present

## 2014-09-21 DIAGNOSIS — N323 Diverticulum of bladder: Secondary | ICD-10-CM | POA: Diagnosis not present

## 2014-09-21 DIAGNOSIS — Z6828 Body mass index (BMI) 28.0-28.9, adult: Secondary | ICD-10-CM | POA: Diagnosis not present

## 2014-09-21 DIAGNOSIS — N401 Enlarged prostate with lower urinary tract symptoms: Secondary | ICD-10-CM | POA: Diagnosis not present

## 2014-09-21 DIAGNOSIS — R339 Retention of urine, unspecified: Secondary | ICD-10-CM | POA: Diagnosis not present

## 2014-09-26 DIAGNOSIS — B351 Tinea unguium: Secondary | ICD-10-CM | POA: Diagnosis not present

## 2014-09-26 DIAGNOSIS — E1142 Type 2 diabetes mellitus with diabetic polyneuropathy: Secondary | ICD-10-CM | POA: Diagnosis not present

## 2014-09-26 DIAGNOSIS — L851 Acquired keratosis [keratoderma] palmaris et plantaris: Secondary | ICD-10-CM | POA: Diagnosis not present

## 2014-10-02 NOTE — Op Note (Signed)
PATIENT NAME:  Jeremy [name], Jeremy [name] MR#:  [id number] DATE OF BIRTH:  [birth year]  DATE OF PROCEDURE:  02/16/2012  PREOPERATIVE DIAGNOSIS: Bladder diverticulum.   POSTOPERATIVE DIAGNOSIS: Bladder diverticulum.  PROCEDURE: Bladder diverticulectomy, right ureteral reimplantation.   SURGEON: Edrick Oh, M.D.   ANESTHESIA: General endotracheal anesthesia.   INDICATIONS: The patient is a 79 year old gentleman with a long history of bladder outlet obstruction. He has a known large bladder diverticulum which has progressively enlarged making emptying of the bladder much more difficult. He presents for bladder diverticulectomy with possible ureteral reimplantation.   DESCRIPTION OF PROCEDURE: After informed consent was obtained, the patient was taken to the Operating Room and placed in the supine position on the operating room table. He was then prepped and draped in the standard fashion. A midline infraumbilical incision was made from the symphysis to the umbilicus. The incision was continued down to the external fascia. The fascia was then entered. A large amount of scar was encountered related to a previous lower abdominal incision. The superior vesicle space was entered. The peritoneal reflection was entered. The peritoneum was dissected free from the overlying abdominal wall. The bladder was identified with a Foley catheter in place. A 22 French Foley catheter was placed at the beginning of the procedure. The bladder diverticulum was noted to occupy a significant portion of the right pelvis making delineation of the actual bladder more difficult. A lateral incision was made at the approximate site of the Foley catheter balloon. As the bladder was entered, a large amount of urine was drained with some debris noted. The bladder was then opened approaching the dome. Packing was placed into the anterior portion of the bladder. The Bookwalter retractor was placed. A large 2 cm opening bladder diverticulum was noted  off the right lateral wall. A 22 French Foley catheter was inserted with approximately 100 mL placed into the catheter balloon. The ureteral orifice was not able to be visualized. On the right-hand side, a significant portion of the bladder mucosa was pulled into the diverticulum. The left ureteral orifice was easily identified with clear efflux noted. The decision was made to proceed with encircling of the bladder diverticulum. The muscle was dissected off of the diverticulum. Multiple attempts at visualization of the ureter were made during this process. This could not be identified. A plane was then developed lateral to the bladder on the right. It was extended down to the opening of the diverticulum. The opening was completely encircled and brought to the superior aspect. A plane was developed around the diverticulum. A significant amount of adherent tissue was initially encountered once the proper plane was identified surrounding the diverticulum. A peanut was utilized to help facilitate separation of the diverticulum from the surrounding tissue. This was taken down to the base. As the diverticulum was dissected, it was entered in several locations. A moist lap pad was placed into the diverticulum. After removal of the Foley catheter, this made better manipulation of the diverticulum. The diverticulum was taken down to its base. A right angle clamp was utilized to help facilitate separation. Several small vessels were encountered. These were sealed utilizing the Harmonic scalpel. No overall significant bleeding was encountered. The diverticulum was then removed in its entirety. The ureter was never identified during the entire portion of the dissection. The decision was made to identify the ureter at the level of the crossing vessels. A plane was developed in the retroperitoneal space. A significant amount of adherent tissue was encountered from previous surgery.  The iliac artery was easily identified. It was  noted to be very tortuous. The ureter was identified just medial. It was dissected free and dissected as distal as possible. Care was taken to maintain adequate tissue surrounding the ureter. The ureter was then transected. It was oversewn utilizing a 2-0 chromic suture LigaSure. The bladder wall was repaired at the site of the diverticulum in a two layer fashion using 2-0 Vicryl sutures. There was adequate length of the ureter for reimplantation. Due to the site of the diverticulum, the decision was made to place the reanastomosis below the repair site. The ureter was positioned in the retroperitoneum approaching the bladder. A right angle clamp was then utilized to facilitate an opening at the desired location for reimplantation. A 4-0 Vicryl suture was placed into the periureteral tissue. The ureter was brought through the bladder wall into the anterior. The ureter was then spatulated approximately 8 mm. A second 4-0 Vicryl suture was placed at the apex of the ureter. It was secured to the distal most portion of the new bladder opening. The posterior aspect of the spatulation was secured to the more proximal portion of the bladder opening. Reapproximation of the spatulated ureteral edge to the bladder was then performed in the standard fashion. A guidewire was then placed into the ureter. A 7 French x 24 cm double-J ureteral stent was advanced over the guidewire into the upper pole collecting system. The stent was easily identified within the ureter. The bladder was then closed in the anterior aspect utilizing a two layer closure of 2-0 Vicryl sutures. A small opening in the peritoneal compartment was closed utilizing a 2-0 chromic suture. The abdominal wall was then       closed utilizing a #1 Prolene suture in a two-segment fashion. The skin was then closed utilizing staples. A Telfa Tegaderm dressing was applied. The patient tolerated the procedure well. There were no problems or other complications.  Estimated blood loss was approximately 200 mL. ____________________________ Denice Bors. Jacqlyn Larsen, MD bsc:slb D: 02/16/2012 13:09:11 ET T: 02/16/2012 13:26:04 ET JOB#: [id number]  cc: Denice Bors. Jacqlyn Larsen, MD, <Dictator> Denice Bors Makaio Mach MD ELECTRONICALLY SIGNED 02/18/2012 11:54

## 2014-10-02 NOTE — Op Note (Signed)
PATIENT NAME:  Jeremy [name], Jeremy [name] MR#:  [id number] DATE OF BIRTH:  09-Jul-1935  DATE OF PROCEDURE:  04/21/2012  PREOPERATIVE DIAGNOSIS:  Right carpal tunnel syndrome.  POSTOPERATIVE DIAGNOSIS:  Right carpal tunnel syndrome.  OPERATION: Right carpal tunnel release.   SURGEON: Park Breed, M.D.   ANESTHESIA: General LMA.   COMPLICATIONS: None.   DRAINS: None.   OPERATIVE PROCEDURE: The patient was brought to the operating room where he underwent satisfactory general LMA anesthesia in the supine position. The right arm was prepped and draped in sterile fashion. An Esmarch was applied. The tourniquet was inflated to 250 mmHg. Tourniquet time was 14 minutes. A longitudinal incision was made in the palm and dissection carried out sharply through subcutaneous tissue using loupe magnification. The distal aspect of the volar carpal ligament was identified and a Kelly clamp passed beneath this. The volar aspect of the ligament was freed up of soft tissue adhesions. The volar ligament was then divided under direct vision with a mini blade knife over the Kelly clamp. The carpal tunnel scissors were used to finish this proximally. The nerve was pale and flattened. It was freed up from adhesions with a mosquito clamp. The motor branch was seen to arise more volarly than normal but was intact and was spared. The ulnar nerve and artery were freed up in the palm as well. The wound was then irrigated and closed with running 4-0 nylon suture.  0.5% Marcaine was placed in the wound and a dry sterile compression hand dressing was applied. The tourniquet was deflated with good return of blood flow to the hand. The patient was awakened and taken to recovery in good condition.    ____________________________ Park Breed, MD hem:bjt D:  04/21/2012 09:36:10 ET           T: 04/21/2012 12:00:39 ET          JOB#: [id number] Park Breed MD ELECTRONICALLY SIGNED 04/21/2012 18:39

## 2014-10-02 NOTE — Op Note (Signed)
PATIENT NAME:  Jeremy [name], Jeremy [name] MR#:  [id number] DATE OF BIRTH:  [birth year]  DATE OF PROCEDURE:  05/05/2012  PREOPERATIVE DIAGNOSIS: Left carpal tunnel syndrome.   POSTOPERATIVE DIAGNOSIS: Left carpal tunnel syndrome.   OPERATION: Left carpal tunnel release.   SURGEON: Park Breed, M.D.   ANESTHESIA: General LMA.   COMPLICATIONS: None.   DRAINS: None.   DESCRIPTION OF PROCEDURE: The patient was brought to the Operating Room where he underwent satisfactory general LMA anesthesia in the supine position. The left arm was prepped and draped in sterile fashion and an Esmarch was applied. The tourniquet was inflated to 250 mmHg. Tourniquet time was 22 minutes. A longitudinal incision was made in the base of the palm just to the ulnar side of midline. Dissection was carried out bluntly through subcutaneous tissue. The motor branch of the median nerve was seen to exit directly volarly and curl around the distal edge of the volar carpal ligament. This was carefully protected and dissected around. The Kelly clamp was passed beneath the volar carpal ligament which had been stripped of soft tissue adhesions and a mini blade knife was used to release the ligament. Carpal tunnel scissors were used proximally to release the proximal portion. The motor branch was protected throughout. The median nerve was pale and flattened. It was freed up from adhesions with a mosquito clamp. The ulnar nerve and artery were unroofed in the palm as well. When I was satisfied that the nerve was free of any pressure and that there was no damage to the branches, the wound was irrigated and closed with running 4-0 nylon suture. 0.5% Marcaine was placed in the wound and a dry sterile compression hand dressing with volar splint was applied. The      tourniquet was deflated with good return of blood flow to the hand. The patient was awakened and taken to recovery in good condition.  ____________________________ Park Breed, MD hem:ap D: 05/05/2012 08:27:49 ET T: 05/05/2012 10:23:43 ET JOB#: [id number]  cc: Park Breed, MD, <Dictator> Park Breed MD ELECTRONICALLY SIGNED 05/05/2012 12:47

## 2014-10-02 NOTE — Discharge Summary (Signed)
PATIENT NAME:  Jeremy [name], Jeremy [name] MR#:  [id number] DATE OF BIRTH:  1936-05-13  DATE OF ADMISSION:  02/16/2012 DATE OF DISCHARGE:  02/18/2012  PRINCIPLE DIAGNOSIS: Bladder diverticulum.   PROCEDURE: Bladder diverticulectomy with right ureteral reimplantation.   INDICATIONS: The patient is a 79 year old gentleman with a long history of a known bladder diverticulum. He has had progressive worsening of urinary symptoms with progressive growth of the diverticulum. He presented for diverticulectomy.   HOSPITAL COURSE: Dr. Oswaldo Milian was taken to the operating room on 02/16/2012 at which time he underwent a bladder diverticulectomy. The right ureteral orifice was felt to be pulled into the diverticulum as it was never visualized within the bladder. The diverticulum was excised with repair of the bladder defect. The right ureter was reimplanted into the base of the bladder utilizing standard fashion. His postoperative course was without significant problems or complications. He remained afebrile, vital signs stable throughout his hospitalization. His pain was initially controlled on PCA morphine. He developed significant hiccups, however, related to the medication. The morphine was discontinued on postoperative day one. He was placed on Dilaudid for oral pain control. He did not tolerate the medication well due to side effects. He was controlled further with Percocet and Toradol. He was noted to ambulate promptly. He maintained good urine output. The urine was initially moderate blood-tinged with gradual clearing to only mild blood-tinged. The incision site remained clean, dry, and intact. He was ambulating without difficulty. He was tolerating a general diet. He did have return promptly of bowel function. He continued to do well with good pain control. He was discharged on the morning of postoperative day two with instructions to follow-up in 7 to 10 days for Foley catheter removal and staple removal. He was instructed  to do no heavy lifting or straining for at least six weeks. He was discharged on Percocet one every 4 to 6 hours as needed for pain. He was also discharged on Cipro 500 mg for a seven day course. He is to, otherwise, restart his other medications. He is to notify us if there are any further problems or questions in the interim.  ____________________________ Denice Bors Jacqlyn Larsen, MD bsc:drc D: 02/18/2012 21:59:44 ET T: 02/21/2012 15:44:54 ET JOB#: [id number] cc: Denice Bors. Jacqlyn Larsen, MD, <Dictator> Denice Bors Batina Dougan MD ELECTRONICALLY SIGNED 02/23/2012 7:08

## 2014-10-05 NOTE — Op Note (Signed)
PATIENT NAME:  Jeremy [name], Jeremy [name] MR#:  [id number] DATE OF BIRTH:  Dec 24, 1935  DATE OF PROCEDURE:  04/14/2013  PRINCIPAL DIAGNOSES: Benign prostatic hypertrophy, bladder outlet obstruction, chronic urinary retention.   POSTOPERATIVE DIAGNOSES: Benign prostatic hypertrophy, bladder outlet obstruction, chronic urinary retention.   PROCEDURE: KTP laser transurethral resection of the prostate.   SURGEON: Edrick Oh, M.D.   ANESTHESIA: General endotracheal anesthesia.   INDICATIONS: The patient is a 79 year old gentleman with a long history of bladder outlet obstruction and large bladder diverticulum. He underwent bladder diverticulectomy several years prior with continued high bladder residuals. He has a relatively small prostate but high-riding bladder neck consistent with persistent bladder outlet obstruction. He presents today for ventral hernia repair as well as laser prostatectomy.   DESCRIPTION OF PROCEDURE: After informed consent was obtained, the patient was taken to the operating room and placed in the supine position on the operating table. He underwent the ventral hernia repair under the direction of Dr. Jamal Collin. This will be dictated under a separate operative note.   Upon completion of the ventral hernia repair, he was repositioned into the dorsal lithotomy position. He was then reprepped and draped in the usual standard fashion. The saline resectoscope sheath was placed under direct vision with no urethral abnormalities noted. Upon entering the prostatic fossa, the verumontanum was easily identified. Minimal to moderate bilobar hypertrophy was noted with a high-riding bladder neck. The prostate fossa was noted to be relatively short. Upon entering the bladder, 2+ trabeculation was noted throughout. The left ureteral orifice was easily identified. No abnormalities were noted. The right ureteral orifice was identified as well as the reimplant orifice on the right posterior bladder wall. The site  of the previous diverticulectomy demonstrates no evidence of recurrence. The bladder was noted to be fairly full at the time of evaluation. The bladder was drained.   The KTP laser fiber was then placed through the scope. An incision was made at the level of the bladder neck to the verumontanum utilizing the KTP laser fiber. There was a significant distance of at least 1 to 1.5 cm from the level of the bladder neck to the bladder base. The lateral lobe tissue was then taken down utilizing the laser fiber. The initial watts was set at 80. It was increased to 120 watts after the bulk of the channel was developed. Some anterior tissue was present. This was also taken down utilizing the laser. One small area of bleeding was encountered near the left apex. This was cauterized utilizing the laser fiber. No other bleeding was encountered.   Once a more-than-adequate channel was developed, re-examination of the bladder base was undertaken with no abnormalities appreciated. The ureteral orifices were once again identified. The bladder was drained. The resectoscope was removed. An 18-French Foley catheter was placed with moderate blood return. This was irrigated for several moments with some persistent blood. The 18-French 10 mL balloon Foley catheter was removed. An 18-French Foley catheter with a 30 mL balloon was inserted without difficulty utilizing the catheter guide and 60 mL was placed into the balloon. Pressure was applied to the prostate. Irrigation was performed through  the catheter with complete resolution of the bleeding. The catheter was placed to gravity drainage. The patient was awakened from general endotracheal anesthesia and was taken to the recovery room in stable condition. There were no problems or complications. The patient tolerated the procedure well.   ESTIMATED BLOOD LOSS: Less than 100 mL.  ____________________________ Denice Bors. Jacqlyn Larsen, MD bsc:np  D: 04/14/2013 16:37:35 ET T: 04/14/2013  16:50:43 ET JOB#: [id number]  cc: Denice Bors. Jacqlyn Larsen, MD, <Dictator> Denice Bors Lequisha Cammack MD ELECTRONICALLY SIGNED 04/17/2013 9:56

## 2014-10-05 NOTE — Op Note (Signed)
PATIENT NAME:  Jeremy [name], Jeremy [name] MR#:  [id number] DATE OF BIRTH:  02-Sep-1935  DATE OF PROCEDURE:  04/13/2013  PREOPERATIVE DIAGNOSIS: Incisional hernia.   POSTOPERATIVE DIAGNOSIS: Incisional hernia with adhesions of omentum.   OPERATION PERFORMED: Laparoscopy, partial omentectomy and repair of incisional hernia with Physiomesh.   SURGEON: S.G. Jamal Collin, MD  ANESTHESIA: General.   COMPLICATIONS: None.   ESTIMATED BLOOD LOSS: Less than 25 mL.   DRAINS: None.   DESCRIPTION OF PROCEDURE: The patient was put to sleep in the supine position on the operating table. The abdomen was prepped and draped out as a sterile field. Timeout procedure was performed. The initial entry was made in the left upper quadrant just below the costal margin. A small incision was made, and a Veress needle with the InnerDyne sleeve was positioned in the peritoneal cavity and verified with the hanging drop method. Pneumoperitoneum was obtained, followed by placement of a 10 mm port. An angled scope was then introduced and showed the patient had omental adhesion to the mid epigastric region, which is where the hernia was located. The patient also, inferior to this, had small bowel adhesions in the right lower quadrant and lower abdominal area. Along the left side, 2 additional ports were placed, one 12 mm and one 5 mm port. Initially, the adhesions of the omentum to the anterior abdominal wall were taken down. Some of these were filmy adhesions that were taken down easily, and then at the site of the hernia, there was some dense adhesion of the omentum to the peritoneal surface around the hernia. With careful exposure and traction and cautery, these adhesions were taken down. The herniated portion of the omentum was then gently retracted back, and some of the adhesions of this to the peritoneal sac were taken down. This was a sizable portion of the omentum that was incarcerated into the hernia and appeared to be somewhat scarred and  less than viable. Accordingly, this portion of the omentum that was incarcerated was removed. Using Endo GIA white loads, this was taken down. The excised omentum was then placed in a retrieval bag and brought out through the 12-mm port site. Following this, the hernial opening was identified, and this appeared to be about a 2 to 2.5 cm sized circular opening in the mid epigastric region. Some of the remaining adhesions of the falciform ligament were all taken down to allow for placement of a mesh with a 5 cm margin all around. A 15 x 10 mm oval Physiomesh was brought up to the field. It was then placed within the peritoneal cavity and placed up against the anterior abdominal wall. SecureStrap was then used to tack it down all the way circumferentially. Four sutures of 0 Prolene were then placed through tiny stab incisions superior, inferior and two lateral to secure the graft in place. The repair was noted to be adequate, with good coverage of the fascial defect. The small bowel adhesions which were somewhat inferior and to the right in the abdomen were left alone since they did not appear to be causing any problems. The pneumoperitoneum was released, and the ports were removed, and the skin incisions were closed with subcuticular 4-0 Vicryl, covered with Dermabond. The tiny stab incisions for the Prolene stitches were also closed with Dermalon. At this point, the procedure was turned over to Dr. Edrick Oh, who performed a TUR for his prostate. This portion will be dictated by him.   ____________________________ S.Robinette Haines, MD sgs:lb D: 04/14/2013 09:01:26  ET T: 04/14/2013 09:15:38 ET JOB#: [id number]  cc: S.G. Jamal Collin, MD, <Dictator> [facility] Robinette Haines MD ELECTRONICALLY SIGNED 04/17/2013 19:02

## 2014-11-14 ENCOUNTER — Encounter: Payer: Self-pay | Admitting: Family Medicine

## 2014-11-16 ENCOUNTER — Other Ambulatory Visit: Payer: Self-pay | Admitting: Gastroenterology

## 2014-11-16 DIAGNOSIS — Z86010 Personal history of colonic polyps: Secondary | ICD-10-CM | POA: Diagnosis not present

## 2014-11-16 DIAGNOSIS — Z800 Family history of malignant neoplasm of digestive organs: Secondary | ICD-10-CM | POA: Diagnosis not present

## 2014-11-23 ENCOUNTER — Ambulatory Visit
Admission: RE | Admit: 2014-11-23 | Discharge: 2014-11-23 | Disposition: A | Discharge status: Home / Self Care | Payer: Medicare Other | Source: Ambulatory Visit | Attending: Gastroenterology | Admitting: Gastroenterology

## 2014-11-23 DIAGNOSIS — Z800 Family history of malignant neoplasm of digestive organs: Secondary | ICD-10-CM | POA: Insufficient documentation

## 2014-11-23 DIAGNOSIS — Z8551 Personal history of malignant neoplasm of bladder: Secondary | ICD-10-CM | POA: Diagnosis not present

## 2014-12-20 ENCOUNTER — Encounter: Payer: Self-pay | Admitting: *Deleted

## 2014-12-21 ENCOUNTER — Ambulatory Visit: Payer: Medicare Other | Admitting: Anesthesiology

## 2014-12-21 ENCOUNTER — Ambulatory Visit
Admission: RE | Admit: 2014-12-21 | Discharge: 2014-12-21 | Disposition: A | Discharge status: Home / Self Care | Payer: Medicare Other | Source: Ambulatory Visit | Attending: Gastroenterology | Admitting: Gastroenterology

## 2014-12-21 ENCOUNTER — Encounter: Payer: Self-pay | Admitting: Anesthesiology

## 2014-12-21 ENCOUNTER — Encounter
Admission: RE | Disposition: A | Discharge status: Home / Self Care | Payer: Self-pay | Source: Ambulatory Visit | Attending: Gastroenterology

## 2014-12-21 DIAGNOSIS — Z79899 Other long term (current) drug therapy: Secondary | ICD-10-CM | POA: Insufficient documentation

## 2014-12-21 DIAGNOSIS — Z09 Encounter for follow-up examination after completed treatment for conditions other than malignant neoplasm: Secondary | ICD-10-CM | POA: Insufficient documentation

## 2014-12-21 DIAGNOSIS — E785 Hyperlipidemia, unspecified: Secondary | ICD-10-CM | POA: Diagnosis not present

## 2014-12-21 DIAGNOSIS — I10 Essential (primary) hypertension: Secondary | ICD-10-CM | POA: Diagnosis not present

## 2014-12-21 DIAGNOSIS — Z980 Intestinal bypass and anastomosis status: Secondary | ICD-10-CM | POA: Insufficient documentation

## 2014-12-21 DIAGNOSIS — K649 Unspecified hemorrhoids: Secondary | ICD-10-CM | POA: Diagnosis not present

## 2014-12-21 DIAGNOSIS — H409 Unspecified glaucoma: Secondary | ICD-10-CM | POA: Insufficient documentation

## 2014-12-21 DIAGNOSIS — Z86010 Personal history of colonic polyps: Secondary | ICD-10-CM | POA: Diagnosis not present

## 2014-12-21 DIAGNOSIS — K5730 Diverticulosis of large intestine without perforation or abscess without bleeding: Secondary | ICD-10-CM | POA: Diagnosis not present

## 2014-12-21 DIAGNOSIS — Z1211 Encounter for screening for malignant neoplasm of colon: Secondary | ICD-10-CM | POA: Diagnosis not present

## 2014-12-21 DIAGNOSIS — K449 Diaphragmatic hernia without obstruction or gangrene: Secondary | ICD-10-CM | POA: Insufficient documentation

## 2014-12-21 DIAGNOSIS — Z800 Family history of malignant neoplasm of digestive organs: Secondary | ICD-10-CM | POA: Diagnosis not present

## 2014-12-21 DIAGNOSIS — D126 Benign neoplasm of colon, unspecified: Secondary | ICD-10-CM | POA: Diagnosis not present

## 2014-12-21 DIAGNOSIS — Z880 Allergy status to penicillin: Secondary | ICD-10-CM | POA: Diagnosis not present

## 2014-12-21 DIAGNOSIS — E1140 Type 2 diabetes mellitus with diabetic neuropathy, unspecified: Secondary | ICD-10-CM | POA: Insufficient documentation

## 2014-12-21 DIAGNOSIS — K5790 Diverticulosis of intestine, part unspecified, without perforation or abscess without bleeding: Secondary | ICD-10-CM | POA: Diagnosis not present

## 2014-12-21 DIAGNOSIS — E780 Pure hypercholesterolemia: Secondary | ICD-10-CM | POA: Insufficient documentation

## 2014-12-21 DIAGNOSIS — Z888 Allergy status to other drugs, medicaments and biological substances status: Secondary | ICD-10-CM | POA: Insufficient documentation

## 2014-12-21 HISTORY — DX: Diverticulosis of intestine, part unspecified, without perforation or abscess without bleeding: K57.90

## 2014-12-21 HISTORY — DX: Unspecified hemorrhoids: K64.9

## 2014-12-21 HISTORY — DX: Personal history of other diseases of the digestive system: Z87.19

## 2014-12-21 HISTORY — DX: Unspecified glaucoma: H40.9

## 2014-12-21 HISTORY — DX: Type 2 diabetes mellitus with diabetic neuropathy, unspecified: E11.40

## 2014-12-21 HISTORY — PX: COLONOSCOPY WITH PROPOFOL: SHX5780

## 2014-12-21 HISTORY — DX: Tinea unguium: B35.1

## 2014-12-21 HISTORY — DX: Hyperlipidemia, unspecified: E78.5

## 2014-12-21 LAB — GLUCOSE, CAPILLARY: Glucose-Capillary: 194 mg/dL — ABNORMAL HIGH (ref 65–99)

## 2014-12-21 SURGERY — COLONOSCOPY WITH PROPOFOL
Anesthesia: General

## 2014-12-21 MED ORDER — PROPOFOL INFUSION 10 MG/ML OPTIME
INTRAVENOUS | Status: DC | PRN
  Administered 2014-12-21: 120 ug/kg/min via INTRAVENOUS

## 2014-12-21 MED ORDER — PROPOFOL 10 MG/ML IV BOLUS
INTRAVENOUS | Status: DC | PRN
  Administered 2014-12-21: 50 mg via INTRAVENOUS

## 2014-12-21 MED ORDER — SODIUM CHLORIDE 0.9 % IV SOLN
INTRAVENOUS | Status: DC
  Administered 2014-12-21: 1000 mL via INTRAVENOUS

## 2014-12-21 MED ORDER — FENTANYL CITRATE (PF) 100 MCG/2ML IJ SOLN
INTRAMUSCULAR | Status: DC | PRN
  Administered 2014-12-21 (×2): 25 ug via INTRAVENOUS

## 2014-12-21 NOTE — Anesthesia Postprocedure Evaluation (Signed)
  Anesthesia Post-op Note  Patient: Jeremy [name]  Procedure(s) Performed: Procedure(s): COLONOSCOPY WITH PROPOFOL (N/A)  Anesthesia type:General  Patient location: PACU  Post pain: Pain level controlled  Post assessment: Post-op Vital signs reviewed, Patient's Cardiovascular Status Stable, Respiratory Function Stable, Patent Airway and No signs of Nausea or vomiting  Post vital signs: Reviewed and stable  Last Vitals:  Filed Vitals:   12/21/14 1030  BP: 112/86  Pulse: 62  Temp:   Resp: 20    Level of consciousness: awake, alert  and patient cooperative  Complications: No apparent anesthesia complications

## 2014-12-21 NOTE — H&P (Signed)
Outpatient short stay form Pre-procedure 12/21/2014 9:25 AM Lollie Sails MD  Primary Physician: Dr. Evans Lance  Reason for visit:  Colonoscopy  History of present illness:  Personal history colon polyps. Patient is a 79 year old male with a personal history of colon polyps. He had a segmental colectomy of the sigmoid colon due to a large polyp noted in 2000. Last colonoscopy was in 2013. There was no polyps at that time. He tolerated his prep well. Takes no aspirin or blood thinning products.    Current facility-administered medications:  .  0.9 %  sodium chloride infusion, , Intravenous, Continuous, Lollie Sails, MD, Last Rate: 20 mL/hr at 12/21/14 0844, 1,000 mL at 12/21/14 0844  Prescriptions prior to admission  Medication Sig Dispense Refill Last Dose  . bisoprolol-hydrochlorothiazide (ZIAC) 10-6.25 MG per tablet Take 1 tablet by mouth daily.   12/21/2014 at 700amnown time  . canagliflozin (INVOKANA) 300 MG TABS tablet Take 300 mg by mouth daily before breakfast.   Past Week at 700am time  . dorzolamide (TRUSOPT) 2 % ophthalmic solution Place 1 drop into the left eye 2 (two) times daily.   12/21/2014 at 700amn time  . esomeprazole (NEXIUM) 20 MG capsule Take 20 mg by mouth daily before breakfast.   12/21/2014 at 700amtime  . finasteride (PROSCAR) 5 MG tablet Take 5 mg by mouth daily.   12/21/2014 at 700am  . glimepiride (AMARYL) 2 MG tablet Take 4 mg by mouth daily before breakfast.    Past Week at Unknown time  . loratadine (CLARITIN) 10 MG tablet Take 10 mg by mouth daily.   Past Week at Unknown time  . meloxicam (MOBIC) 15 MG tablet Take 15 mg by mouth daily.   Past Week at Unknown time  . metFORMIN (GLUCOPHAGE) 1000 MG tablet Take 1,000 mg by mouth 2 (two) times daily with a meal.   Past Week at Unknown time  . ranitidine (ZANTAC) 150 MG tablet Take 150 mg by mouth at bedtime.   12/21/2014 at Unknown time  . rosuvastatin (CRESTOR) 20 MG tablet Take 20 mg by mouth daily.    12/20/2014 at 37mn  . saxagliptin HCl (ONGLYZA) 5 MG TABS tablet Take 5 mg by mouth daily.   Past Week at Unknown time  . valsartan (DIOVAN) 160 MG tablet Take 160 mg by mouth daily.   12/21/2014 at 700amnknown time  . tamsulosin (FLOMAX) 0.4 MG CAPS Take 0.4 mg by mouth 2 (two) times daily.   Completed Course at Unknown time     Allergies  Allergen Reactions  . Actos [Pioglitazone] Other (See Comments)  . Demerol [Meperidine] Other (See Comments)    hypotension  . Dilaudid [Hydromorphone Hcl]   . Penicillins Rash     Past Medical History  Diagnosis Date  . Small bowel obstruction 1990's  . Diabetes mellitus without complication   . Hypertension   . Diverticula, colon 1980's  . High cholesterol   . Urination disorder   . History of hiatal hernia   . Hyperlipidemia   . Glaucoma   . Diabetic neuropathy   . Onychomycosis   . Diverticulosis   . Hemorrhoids     Review of systems:      Physical Exam    Heart and lungs: Regular rate and rhythm without rub or gallop lungs bilaterally clear    HEENT: Normocephalic atraumatic eyes are anicteric    Other:     Pertinant exam for procedure: Soft nontender nondistended bowel sounds positive normoactive  Planned proceedures: Colonoscopy and indicated procedures. I have discussed the risks benefits and complications of procedures to include not limited to bleeding, infection, perforation and the risk of sedation and the patient wishes to proceed.    Lollie Sails, MD Gastroenterology 12/21/2014  9:25 AM

## 2014-12-21 NOTE — Anesthesia Preprocedure Evaluation (Signed)
Anesthesia Evaluation  Patient identified by MRN, date of birth, ID band Patient awake    Reviewed: Allergy & Precautions, H&P , NPO status , Patient's Chart, lab work & pertinent test results, reviewed documented beta blocker date and time   Airway Mallampati: II  TM Distance: >3 FB Neck ROM: full    Dental no notable dental hx. (+) Teeth Intact   Pulmonary neg pulmonary ROS,  breath sounds clear to auscultation  Pulmonary exam normal       Cardiovascular hypertension, - Past MI Normal cardiovascular examRhythm:regular Rate:Normal     Neuro/Psych negative neurological ROS  negative psych ROS   GI/Hepatic Neg liver ROS, hiatal hernia,   Endo/Other  diabetes, Well Controlled, Type 2  Renal/GU negative Renal ROS  negative genitourinary   Musculoskeletal   Abdominal   Peds  Hematology negative hematology ROS (+)   Anesthesia Other Findings Past Medical History:   Small bowel obstruction                         1990's       Diabetes mellitus without complication                       Hypertension                                                 Diverticula, colon                              1980's       High cholesterol                                             Urination disorder                                           History of hiatal hernia                                     Hyperlipidemia                                               Glaucoma                                                     Diabetic neuropathy                                          Onychomycosis  Diverticulosis                                               Hemorrhoids                                                  Reproductive/Obstetrics negative OB ROS                             Anesthesia Physical Anesthesia Plan  ASA: III  Anesthesia Plan: General    Post-op Pain Management:    Induction:   Airway Management Planned:   Additional Equipment:   Intra-op Plan:   Post-operative Plan:   Informed Consent: I have reviewed the patients History and Physical, chart, labs and discussed the procedure including the risks, benefits and alternatives for the proposed anesthesia with the patient or authorized representative who has indicated his/her understanding and acceptance.   Dental Advisory Given  Plan Discussed with: Anesthesiologist, CRNA and Surgeon  Anesthesia Plan Comments:         Anesthesia Quick Evaluation

## 2014-12-21 NOTE — Transfer of Care (Signed)
Immediate Anesthesia Transfer of Care Note  Patient: Jeremy [name]  Procedure(s) Performed: Procedure(s): COLONOSCOPY WITH PROPOFOL (N/A)  Patient Location: PACU and Endoscopy Unit  Anesthesia Type:General  Level of Consciousness: awake, alert  and oriented  Airway & Oxygen Therapy: Patient Spontanous Breathing and Patient connected to nasal cannula oxygen  Post-op Assessment: Report given to RN and Post -op Vital signs reviewed and stable  Post vital signs: Reviewed and stable  Last Vitals:  Filed Vitals:   12/21/14 1003  BP:   Pulse:   Temp: 36 C  Resp:     Complications: No apparent anesthesia complications

## 2014-12-21 NOTE — Anesthesia Procedure Notes (Signed)
Date/Time: 12/21/2014 9:41 AM Performed by: Kennon Holter Oxygen Delivery Method: Nasal cannula Preoxygenation: Pre-oxygenation with 100% oxygen Intubation Type: IV induction

## 2014-12-21 NOTE — Op Note (Signed)
[facility] Gastroenterology Patient Name: Jeremy [name] Procedure Date: 12/21/2014 9:18 AM MRN: [id number] Account #: [id number] Date of Birth: August 29, 1935 Admit Type: Outpatient Age: 79 Room: [facility] ENDO ROOM 3 Gender: Male Note Status: Finalized Procedure:         Colonoscopy Indications:       Family history of colon cancer in a first-degree relative,                     Personal history of colonic polyps Providers:         Lollie Sails, MD Referring MD:      Ashok Norris, MD (Referring MD) Medicines:         Monitored Anesthesia Care Complications:     No immediate complications. Procedure:         Pre-Anesthesia Assessment:                    - ASA Grade Assessment: III - A patient with severe                     systemic disease.                    After obtaining informed consent, the colonoscope was                     passed under direct vision. Throughout the procedure, the                     patient's blood pressure, pulse, and oxygen saturations                     were monitored continuously. The Colonoscope was                     introduced through the anus and advanced to the the cecum,                     identified by appendiceal orifice and ileocecal valve. The                     colonoscopy was performed without difficulty. Findings:      There was evidence of a prior functional end-to-end colo-colonic       anastomosis at 26 cm proximal to the anus. This was patent. This was       characterized by healthy appearing mucosa.      Multiple small and large-mouthed diverticula were found in the entire       colon.      The digital rectal exam was normal. Impression:        - Patent functional end-to-end colo-colonic anastomosis,                     characterized by healthy appearing mucosa.                    - Diverticulosis in the entire examined colon.                    - No specimens collected. Recommendation:    - Repeat  colonoscopy in 3 - 5 years for surveillance. Procedure Code(s): --- Professional ---                    [phone], Colonoscopy, flexible; diagnostic, including  collection of specimen(s) by brushing or washing, when                     performed (separate procedure) Diagnosis Code(s): --- Professional ---                    V16.0, Family history of malignant neoplasm of                     gastrointestinal tract                    V12.72, Personal history of colonic polyps                    V45.3, Intestinal bypass or anastomosis status                    562.10, Diverticulosis of colon (without mention of                     hemorrhage) CPT copyright 2014 [employer]. All rights reserved. The codes documented in this report are preliminary and upon coder review may  be revised to meet current compliance requirements. Lollie Sails, MD 12/21/2014 10:02:12 AM This report has been signed electronically. Number of Addenda: 0 Note Initiated On: 12/21/2014 9:18 AM Scope Withdrawal Time: 0 hours 9 minutes 18 seconds  Total Procedure Duration: 0 hours 13 minutes 1 second       [facility]

## 2014-12-24 ENCOUNTER — Encounter: Payer: Self-pay | Admitting: Gastroenterology

## 2014-12-26 DIAGNOSIS — E1142 Type 2 diabetes mellitus with diabetic polyneuropathy: Secondary | ICD-10-CM | POA: Diagnosis not present

## 2014-12-26 DIAGNOSIS — L851 Acquired keratosis [keratoderma] palmaris et plantaris: Secondary | ICD-10-CM | POA: Diagnosis not present

## 2014-12-26 DIAGNOSIS — B351 Tinea unguium: Secondary | ICD-10-CM | POA: Diagnosis not present

## 2014-12-31 ENCOUNTER — Ambulatory Visit: Payer: Self-pay | Admitting: Family Medicine

## 2015-01-08 ENCOUNTER — Ambulatory Visit (INDEPENDENT_AMBULATORY_CARE_PROVIDER_SITE_OTHER): Payer: Medicare Other | Admitting: Family Medicine

## 2015-01-08 ENCOUNTER — Encounter: Payer: Self-pay | Admitting: Family Medicine

## 2015-01-08 VITALS — BP 112/64 | HR 70 | Temp 98.1°F | Resp 16 | Ht 76.0 in | Wt 222.4 lb

## 2015-01-08 DIAGNOSIS — IMO0002 Reserved for concepts with insufficient information to code with codable children: Secondary | ICD-10-CM | POA: Insufficient documentation

## 2015-01-08 DIAGNOSIS — R809 Proteinuria, unspecified: Secondary | ICD-10-CM | POA: Diagnosis not present

## 2015-01-08 DIAGNOSIS — E1165 Type 2 diabetes mellitus with hyperglycemia: Secondary | ICD-10-CM | POA: Diagnosis not present

## 2015-01-08 DIAGNOSIS — E1140 Type 2 diabetes mellitus with diabetic neuropathy, unspecified: Secondary | ICD-10-CM | POA: Diagnosis not present

## 2015-01-08 DIAGNOSIS — N181 Chronic kidney disease, stage 1: Secondary | ICD-10-CM | POA: Diagnosis not present

## 2015-01-08 DIAGNOSIS — I10 Essential (primary) hypertension: Secondary | ICD-10-CM

## 2015-01-08 DIAGNOSIS — E1129 Type 2 diabetes mellitus with other diabetic kidney complication: Secondary | ICD-10-CM | POA: Diagnosis not present

## 2015-01-08 DIAGNOSIS — E669 Obesity, unspecified: Secondary | ICD-10-CM

## 2015-01-08 LAB — GLUCOSE, POCT (MANUAL RESULT ENTRY): POC Glucose: 212 mg/dl — AB (ref 70–99)

## 2015-01-08 LAB — POCT GLYCOSYLATED HEMOGLOBIN (HGB A1C): HEMOGLOBIN A1C: 8.8

## 2015-01-08 MED ORDER — EXENATIDE ER 2 MG ~~LOC~~ PEN: 2.0000 mg | PEN_INJECTOR | SUBCUTANEOUS | Status: DC

## 2015-01-08 NOTE — Progress Notes (Signed)
Name: Jeremy [name]   MRN: [id number]    DOB: March 11, 1936   Date:01/08/2015       Progress Note  Subjective  Chief Complaint  Chief Complaint  Patient presents with  . Diabetes  . Hypertension  . Hyperlipidemia    Diabetes He presents for his follow-up diabetic visit. He has type 2 diabetes mellitus. His disease course has been worsening. There are no hypoglycemic associated symptoms. Pertinent negatives for hypoglycemia include no dizziness, headaches, nervousness/anxiousness, seizures or tremors. Associated symptoms include blurred vision, polydipsia and visual change. Pertinent negatives for diabetes include no chest pain, no weakness and no weight loss. Symptoms are worsening. Diabetic complications include nephropathy. Risk factors for coronary artery disease include diabetes mellitus, dyslipidemia, hypertension, male sex, obesity, sedentary lifestyle and stress. Current diabetic treatment includes oral agent (triple therapy). He is compliant with treatment all of the time. His weight is increasing steadily. He is following a generally unhealthy diet. Prior visit with dietitian: Refuses. He rarely participates in exercise. His home blood glucose trend is increasing steadily. (Refuses to check)  Hypertension This is a chronic problem. The current episode started more than 1 year ago. The problem is unchanged. The problem is controlled. Associated symptoms include blurred vision. Pertinent negatives include no chest pain, headaches, neck pain, orthopnea, palpitations or shortness of breath. Risk factors for coronary artery disease include diabetes mellitus, dyslipidemia, male gender, obesity and sedentary lifestyle. Past treatments include ACE inhibitors, diuretics and angiotensin blockers. The current treatment provides moderate improvement. Compliance problems include diet and exercise.  Hypertensive end-organ damage includes kidney disease.  Hyperlipidemia This is a chronic problem. The  current episode started more than 1 year ago. The problem is controlled. Recent lipid tests were reviewed and are normal. Exacerbating diseases include obesity. Factors aggravating his hyperlipidemia include fatty foods. Pertinent negatives include no chest pain, focal weakness, myalgias or shortness of breath. Current antihyperlipidemic treatment includes statins. The current treatment provides moderate improvement of lipids. Compliance problems include adherence to diet.    Obesity  Patient full well knows  diabetic diet but has not been following it nor has he been exercising with any regularity.   Past Medical History  Diagnosis Date  . Small bowel obstruction 1990's  . Diabetes mellitus without complication   . Hypertension   . Diverticula, colon 1980's  . High cholesterol   . Urination disorder   . History of hiatal hernia   . Hyperlipidemia   . Glaucoma   . Diabetic neuropathy   . Onychomycosis   . Diverticulosis   . Hemorrhoids     History  Substance Use Topics  . Smoking status: Never Smoker   . Smokeless tobacco: Never Used  . Alcohol Use: No     Current outpatient prescriptions:  .  olopatadine (PATANOL) 0.1 % ophthalmic solution, PATANOL, 0.1% (Ophthalmic Solution)  INSTILL 1 DROP INTO BOTH EYES TWICE DAILY AS NEEDED AT 6-8 HOUR INTERVALS. for 30 days  Quantity: 5.00;  Refills: 11   Ordered :22-Jul-2010  Ashok Norris MD;  Started 20-Sep-2008 Active Comments: Medication taken as needed. , Disp: , Rfl:  .  timolol (TIMOPTIC-XR) 0.25 % ophthalmic gel-forming, , Disp: , Rfl:  .  bisoprolol-hydrochlorothiazide (ZIAC) 10-6.25 MG per tablet, Take 1 tablet by mouth daily., Disp: , Rfl:  .  canagliflozin (INVOKANA) 300 MG TABS tablet, Take 300 mg by mouth daily before breakfast., Disp: , Rfl:  .  dorzolamide (TRUSOPT) 2 % ophthalmic solution, Place 1 drop into the left eye  2 (two) times daily., Disp: , Rfl:  .  esomeprazole (NEXIUM) 20 MG capsule, Take 20 mg by mouth daily  before breakfast., Disp: , Rfl:  .  finasteride (PROSCAR) 5 MG tablet, Take 5 mg by mouth daily., Disp: , Rfl:  .  glimepiride (AMARYL) 2 MG tablet, Take 4 mg by mouth daily before breakfast. , Disp: , Rfl:  .  loratadine (CLARITIN) 10 MG tablet, Take 10 mg by mouth daily., Disp: , Rfl:  .  meloxicam (MOBIC) 15 MG tablet, Take 15 mg by mouth daily., Disp: , Rfl:  .  metFORMIN (GLUCOPHAGE) 1000 MG tablet, Take 1,000 mg by mouth 2 (two) times daily with a meal., Disp: , Rfl:  .  ranitidine (ZANTAC) 150 MG tablet, Take 150 mg by mouth at bedtime., Disp: , Rfl:  .  rosuvastatin (CRESTOR) 20 MG tablet, Take 20 mg by mouth daily., Disp: , Rfl:  .  saxagliptin HCl (ONGLYZA) 5 MG TABS tablet, Take 5 mg by mouth daily., Disp: , Rfl:  .  tamsulosin (FLOMAX) 0.4 MG CAPS, Take 0.4 mg by mouth 2 (two) times daily., Disp: , Rfl:  .  valsartan (DIOVAN) 160 MG tablet, Take 160 mg by mouth daily., Disp: , Rfl:   Allergies  Allergen Reactions  . Actos [Pioglitazone] Other (See Comments)  . Demerol [Meperidine] Other (See Comments)    hypotension  . Dilaudid [Hydromorphone Hcl]   . Penicillins Rash    Review of Systems  Constitutional: Negative for fever, chills and weight loss.  HENT: Negative for congestion, hearing loss, sore throat and tinnitus.   Eyes: Positive for blurred vision. Negative for double vision and redness.       Glaucoma  Respiratory: Negative for cough, hemoptysis and shortness of breath.   Cardiovascular: Positive for leg swelling. Negative for chest pain, palpitations, orthopnea and claudication.  Gastrointestinal: Negative for heartburn, nausea, vomiting, diarrhea, constipation and blood in stool.  Genitourinary: Positive for urgency and frequency. Negative for dysuria and hematuria.  Musculoskeletal: Positive for joint pain. Negative for myalgias, back pain, falls and neck pain.  Skin: Negative for itching.  Neurological: Negative for dizziness, tingling, tremors, focal weakness,  seizures, loss of consciousness, weakness and headaches.  Endo/Heme/Allergies: Positive for polydipsia. Does not bruise/bleed easily.  Psychiatric/Behavioral: Negative for depression and substance abuse. The patient is not nervous/anxious and does not have insomnia.      Objective  Filed Vitals:   01/08/15 0936  BP: 112/64  Pulse: 70  Temp: 98.1 F (36.7 C)  Resp: 16  Height: 6\' 4"  (1.93 m)  Weight: 222 lb 6 oz (100.869 kg)  SpO2: 96%     Physical Exam  Constitutional: He is oriented to person, place, and time.  Obese  HENT:  Head: Normocephalic.  Eyes: EOM are normal. Pupils are equal, round, and reactive to light.  Neck: Normal range of motion. Neck supple. No thyromegaly present.  Cardiovascular: Normal rate, regular rhythm, normal heart sounds and intact distal pulses.   No murmur heard. Pulmonary/Chest: Effort normal and breath sounds normal. No respiratory distress. He has no wheezes.  Abdominal: Soft. Bowel sounds are normal.  Musculoskeletal: Normal range of motion. He exhibits edema.  Lymphadenopathy:    He has no cervical adenopathy.  Neurological: He is alert and oriented to person, place, and time. No cranial nerve deficit. Gait normal. Coordination normal.  Skin: Skin is warm and dry. No rash noted.  Psychiatric: Mood, memory, affect and judgment normal.      Assessment &  Plan  1. Uncontrolled type 2 diabetes with renal manifestation Start Bydureon 2 mg percutaneously once weekly with first given in the office today - POCT HgB A1C - POCT Glucose (CBG) - Exenatide ER (BYDUREON) 2 MG PEN; Inject 2 mg into the skin once a week.  Dispense: 4 each; Refill: 11  2. Type 2 diabetes, uncontrolled, with neuropathy Again encourage diabetic control   3. Chronic kidney disease (CKD), stage I encourage diabetes control and medication compliance and dietary compliance  4. Obesity Obesity handout  5. Abnormal presence of protein in urine Better glucose control  well-controlled  6. Essential hypertension Well-controlled

## 2015-01-08 NOTE — Patient Instructions (Signed)
Start once weekly Bydurion

## 2015-01-26 ENCOUNTER — Other Ambulatory Visit: Payer: Self-pay | Admitting: Family Medicine

## 2015-02-04 DIAGNOSIS — K229 Disease of esophagus, unspecified: Secondary | ICD-10-CM | POA: Diagnosis not present

## 2015-02-04 DIAGNOSIS — Z8709 Personal history of other diseases of the respiratory system: Secondary | ICD-10-CM | POA: Diagnosis not present

## 2015-02-04 DIAGNOSIS — Z48813 Encounter for surgical aftercare following surgery on the respiratory system: Secondary | ICD-10-CM | POA: Diagnosis not present

## 2015-02-04 DIAGNOSIS — J383 Other diseases of vocal cords: Secondary | ICD-10-CM | POA: Diagnosis not present

## 2015-02-08 DIAGNOSIS — H2702 Aphakia, left eye: Secondary | ICD-10-CM | POA: Diagnosis not present

## 2015-02-08 DIAGNOSIS — Z8669 Personal history of other diseases of the nervous system and sense organs: Secondary | ICD-10-CM | POA: Diagnosis not present

## 2015-02-08 DIAGNOSIS — H3531 Nonexudative age-related macular degeneration: Secondary | ICD-10-CM | POA: Diagnosis not present

## 2015-02-08 DIAGNOSIS — Z961 Presence of intraocular lens: Secondary | ICD-10-CM | POA: Diagnosis not present

## 2015-02-08 DIAGNOSIS — H4052X3 Glaucoma secondary to other eye disorders, left eye, severe stage: Secondary | ICD-10-CM | POA: Diagnosis not present

## 2015-02-13 ENCOUNTER — Encounter: Payer: Self-pay | Admitting: Family Medicine

## 2015-02-14 ENCOUNTER — Encounter: Payer: Self-pay | Admitting: *Deleted

## 2015-02-15 ENCOUNTER — Other Ambulatory Visit: Payer: Self-pay | Admitting: Gastroenterology

## 2015-02-15 ENCOUNTER — Encounter
Admission: RE | Disposition: A | Discharge status: Home / Self Care | Payer: Self-pay | Source: Ambulatory Visit | Attending: Gastroenterology

## 2015-02-15 ENCOUNTER — Ambulatory Visit
Admission: RE | Admit: 2015-02-15 | Discharge: 2015-02-15 | Disposition: A | Discharge status: Home / Self Care | Payer: Medicare Other | Source: Ambulatory Visit | Attending: Gastroenterology | Admitting: Gastroenterology

## 2015-02-15 ENCOUNTER — Ambulatory Visit: Payer: Medicare Other | Admitting: Anesthesiology

## 2015-02-15 ENCOUNTER — Encounter: Payer: Self-pay | Admitting: Anesthesiology

## 2015-02-15 DIAGNOSIS — H409 Unspecified glaucoma: Secondary | ICD-10-CM | POA: Diagnosis not present

## 2015-02-15 DIAGNOSIS — I8500 Esophageal varices without bleeding: Secondary | ICD-10-CM | POA: Insufficient documentation

## 2015-02-15 DIAGNOSIS — E785 Hyperlipidemia, unspecified: Secondary | ICD-10-CM | POA: Diagnosis not present

## 2015-02-15 DIAGNOSIS — R1310 Dysphagia, unspecified: Secondary | ICD-10-CM | POA: Insufficient documentation

## 2015-02-15 DIAGNOSIS — E1140 Type 2 diabetes mellitus with diabetic neuropathy, unspecified: Secondary | ICD-10-CM | POA: Diagnosis not present

## 2015-02-15 DIAGNOSIS — K2970 Gastritis, unspecified, without bleeding: Secondary | ICD-10-CM | POA: Insufficient documentation

## 2015-02-15 DIAGNOSIS — Z79899 Other long term (current) drug therapy: Secondary | ICD-10-CM | POA: Insufficient documentation

## 2015-02-15 DIAGNOSIS — I10 Essential (primary) hypertension: Secondary | ICD-10-CM | POA: Diagnosis not present

## 2015-02-15 DIAGNOSIS — Z880 Allergy status to penicillin: Secondary | ICD-10-CM | POA: Insufficient documentation

## 2015-02-15 DIAGNOSIS — K449 Diaphragmatic hernia without obstruction or gangrene: Secondary | ICD-10-CM | POA: Diagnosis not present

## 2015-02-15 DIAGNOSIS — K229 Disease of esophagus, unspecified: Secondary | ICD-10-CM | POA: Diagnosis present

## 2015-02-15 DIAGNOSIS — E164 Increased secretion of gastrin: Secondary | ICD-10-CM | POA: Diagnosis not present

## 2015-02-15 DIAGNOSIS — K3189 Other diseases of stomach and duodenum: Secondary | ICD-10-CM | POA: Diagnosis not present

## 2015-02-15 DIAGNOSIS — Z885 Allergy status to narcotic agent status: Secondary | ICD-10-CM | POA: Diagnosis not present

## 2015-02-15 DIAGNOSIS — K2270 Barrett's esophagus without dysplasia: Secondary | ICD-10-CM | POA: Diagnosis not present

## 2015-02-15 DIAGNOSIS — K2950 Unspecified chronic gastritis without bleeding: Secondary | ICD-10-CM | POA: Diagnosis not present

## 2015-02-15 DIAGNOSIS — E780 Pure hypercholesterolemia: Secondary | ICD-10-CM | POA: Diagnosis not present

## 2015-02-15 DIAGNOSIS — K766 Portal hypertension: Secondary | ICD-10-CM

## 2015-02-15 HISTORY — PX: ESOPHAGOGASTRODUODENOSCOPY (EGD) WITH PROPOFOL: SHX5813

## 2015-02-15 LAB — GLUCOSE, CAPILLARY: GLUCOSE-CAPILLARY: 168 mg/dL — AB (ref 65–99)

## 2015-02-15 SURGERY — ESOPHAGOGASTRODUODENOSCOPY (EGD) WITH PROPOFOL
Anesthesia: General

## 2015-02-15 MED ORDER — PROPOFOL INFUSION 10 MG/ML OPTIME
INTRAVENOUS | Status: DC | PRN
Start: 2015-02-15 — End: 2015-02-15
  Administered 2015-02-15: 120 ug/kg/min via INTRAVENOUS

## 2015-02-15 MED ORDER — LIDOCAINE HCL (CARDIAC) 20 MG/ML IV SOLN
INTRAVENOUS | Status: DC | PRN
Start: 2015-02-15 — End: 2015-02-15
  Administered 2015-02-15: 50 mg via INTRAVENOUS

## 2015-02-15 MED ORDER — SODIUM CHLORIDE 0.9 % IV SOLN
INTRAVENOUS | Status: DC
  Administered 2015-02-15: 1000 mL via INTRAVENOUS

## 2015-02-15 MED ORDER — SODIUM CHLORIDE 0.9 % IV SOLN: INTRAVENOUS | Status: DC

## 2015-02-15 MED ORDER — PROPOFOL 10 MG/ML IV BOLUS
INTRAVENOUS | Status: DC | PRN
  Administered 2015-02-15 (×2): 50 mg via INTRAVENOUS

## 2015-02-15 NOTE — H&P (Signed)
Outpatient short stay form Pre-procedure 02/15/2015 10:57 AM Jeremy Sails MD  Primary Physician: Dr. Lucita Lora  Reason for visit:  EGD  History of present illness:  Patient is a 79 year old male presenting for an EGD today. He recently underwent colonoscopy due to his personal history of colon cancer. 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 At that time the scope was taken to the bottom of the esophagus and an atypical-appearing lesion was noted. He is advised to have an EGD. He takes no aspirin or attending products. Take meloxicam 15 mg daily. He is also taking Nexium 20 mg daily.    Current facility-administered medications:  .  0.9 %  sodium chloride infusion, , Intravenous, Continuous, Jeremy Sails, MD, Last Rate: 20 mL/hr at 02/15/15 0936, 1,000 mL at 02/15/15 6712  Prescriptions prior to admission  Medication Sig Dispense Refill Last Dose  . bisoprolol-hydrochlorothiazide (ZIAC) 10-6.25 MG per tablet Take 1 tablet by mouth daily.   02/15/2015 at 0600  . canagliflozin (INVOKANA) 300 MG TABS tablet Take 300 mg by mouth daily before breakfast.   02/14/2015 at Unknown time  . dorzolamide (TRUSOPT) 2 % ophthalmic solution Place 1 drop into the left eye 2 (two) times daily.   02/14/2015 at Unknown time  . esomeprazole (NEXIUM) 20 MG capsule Take 20 mg by mouth daily before breakfast.   02/14/2015 at Unknown time  . Exenatide ER (BYDUREON) 2 MG PEN Inject 2 mg into the skin once a week. 4 each 11 02/14/2015 at Unknown time  . finasteride (PROSCAR) 5 MG tablet Take 5 mg by mouth daily.   02/15/2015 at 0600  . glimepiride (AMARYL) 2 MG tablet Take 4 mg by mouth daily before breakfast.    02/14/2015 at Unknown time  . loratadine (CLARITIN) 10 MG tablet Take 10 mg by mouth daily.   02/14/2015 at Unknown time  . meloxicam (MOBIC) 15 MG tablet Take 15 mg by mouth daily.   02/14/2015 at Unknown time  . metFORMIN  (GLUCOPHAGE) 1000 MG tablet TAKE ONE TABLET BY MOUTH TWICE DAILY 180 tablet 0 02/14/2015 at Unknown time  . olopatadine (PATANOL) 0.1 % ophthalmic solution PATANOL, 0.1% (Ophthalmic Solution)  INSTILL 1 DROP INTO BOTH EYES TWICE DAILY AS NEEDED AT 6-8 HOUR INTERVALS. for 30 days  Quantity: 5.00;  Refills: 11   Ordered :22-Jul-2010  Ashok Norris MD;  Started 20-Sep-2008 Active Comments: Medication taken as needed.    02/14/2015 at Unknown time  . ranitidine (ZANTAC) 150 MG tablet Take 150 mg by mouth at bedtime.   02/15/2015 at 0600  . rosuvastatin (CRESTOR) 20 MG tablet Take 20 mg by mouth daily.   02/15/2015 at 0600  . saxagliptin HCl (ONGLYZA) 5 MG TABS tablet Take 5 mg by mouth daily.   02/14/2015 at Unknown time  . tamsulosin (FLOMAX) 0.4 MG CAPS Take 0.4 mg by mouth 2 (two) times daily.   02/14/2015 at Unknown time  . timolol (TIMOPTIC-XR) 0.25 % ophthalmic gel-forming    02/14/2015 at Unknown time  . valsartan (DIOVAN) 160 MG tablet Take 160 mg by mouth daily.   02/14/2015 at 0600     Allergies  Allergen Reactions  . Actos [Pioglitazone] Other (See Comments)  . Demerol [Meperidine] Other (See Comments)    hypotension  . Dilaudid [Hydromorphone Hcl]   . Penicillins Rash     Past Medical History  Diagnosis Date  . Small bowel  obstruction 1990's  . Diabetes mellitus without complication   . Hypertension   . Diverticula, colon 1980's  . High cholesterol   . Urination disorder   . History of hiatal hernia   . Hyperlipidemia   . Glaucoma   . Diabetic neuropathy   . Onychomycosis   . Diverticulosis   . Hemorrhoids     Review of systems:      Physical Exam    Heart and lungs: Regular rate and rhythm without rub or gallop, lungs are bilaterally clear    HEENT: Normocephalic atraumatic eyes are anicteric    Other:     Pertinant exam for procedure: Soft nontender nondistended bowel sounds positive normoactive    Planned proceedures: EGD and indicated procedures I have  discussed the risks benefits and complications of procedures to include not limited to bleeding, infection, perforation and the risk of sedation and the patient wishes to proceed.    Jeremy Sails, MD Gastroenterology 02/15/2015  10:57 AM

## 2015-02-15 NOTE — Transfer of Care (Signed)
Immediate Anesthesia Transfer of Care Note  Patient: Jeremy [name]  Procedure(s) Performed: Procedure(s): ESOPHAGOGASTRODUODENOSCOPY (EGD) WITH PROPOFOL (N/A)  Patient Location: Endoscopy Unit  Anesthesia Type:General  Level of Consciousness: awake  Airway & Oxygen Therapy: Patient Spontanous Breathing and Patient connected to nasal cannula oxygen  Post-op Assessment: Report given to RN  Post vital signs: Reviewed  Last Vitals:  Filed Vitals:   02/15/15 1130  BP: 104/77  Pulse: 77  Temp: 36 C  Resp: 16    Complications: No apparent anesthesia complications

## 2015-02-15 NOTE — Op Note (Signed)
[facility] Gastroenterology Patient Name: Jeremy [name] Procedure Date: 02/15/2015 10:51 AM MRN: [id number] Account #: [id number] Date of Birth: [birth year] Admit Type: Outpatient Age: 79 Room: [facility] ENDO ROOM 3 Gender: Male Note Status: Finalized Procedure:         Upper GI endoscopy Indications:       Diagnostic procedure, abnormal transnasal esophagoscopy Providers:         Lollie Sails, MD Referring MD:      Ashok Norris, MD (Referring MD) Medicines:         Monitored Anesthesia Care Complications:     No immediate complications. Procedure:         Pre-Anesthesia Assessment:                    - ASA Grade Assessment: III - A patient with severe                     systemic disease.                    After obtaining informed consent, the endoscope was passed                     under direct vision. Throughout the procedure, the                     patient's blood pressure, pulse, and oxygen saturations                     were monitored continuously. The Olympus GIF-160 endoscope                     (S#. [id number]) was introduced through the mouth, and                     advanced to the fourth part of duodenum. The upper GI                     endoscopy was accomplished without difficulty. The patient                     tolerated the procedure well. Findings:      Grade I varices were found in the lower third of the esophagus and at       the gastroesophageal junction. They were 3 mm in largest diameter. There       were no red wale marks.      A small hiatus hernia was found. The Z-line was a variable distance from       incisors; the hiatal hernia was sliding.      Mild portal hypertensive gastropathy versus gastritis was found in the       gastric body.      Patchy minimal inflammation characterized by erythema was found in the       gastric antrum. Biopsies were taken with a cold forceps for histology.       Biopsies were taken with a cold  forceps for Helicobacter pylori testing.      The cardia and gastric fundus were normal on retroflexion otherwise.      The examined duodenum was normal.      -of note, all biopsy sites had normal hemostasis Impression:        - Grade I esophageal varices. possible esophageal venous  lake noted at the [place] junction.                    - Small hiatus hernia.                    - Portal hypertensive gastropathy versus gastritis.                    - Gastritis. Biopsied.                    - Normal examined duodenum. Recommendation:    - Perform an abdominal ultrasound with dopplers of the                     portal splenic and hepatic veins at appointment to be                     scheduled.                    - Return to GI clinic in 3 weeks. Procedure Code(s): --- Professional ---                    [phone], Esophagogastroduodenoscopy, flexible, transoral;                     with biopsy, single or multiple Diagnosis Code(s): --- Professional ---                    456.1, Esophageal varices without mention of bleeding                    572.3, Portal hypertension                    537.89, Other specified disorders of stomach and duodenum                    535.50, Unspecified gastritis and gastroduodenitis,                     without mention of hemorrhage                    553.3, Diaphragmatic hernia without mention of obstruction                     or gangrene CPT copyright 2014 American [employer]. All rights reserved. The codes documented in this report are preliminary and upon coder review may  be revised to meet current compliance requirements. Lollie Sails, MD 02/15/2015 11:36:00 AM This report has been signed electronically. Number of Addenda: 0 Note Initiated On: 02/15/2015 10:51 AM      [facility]

## 2015-02-15 NOTE — Anesthesia Preprocedure Evaluation (Signed)
Anesthesia Evaluation  Patient identified by MRN, date of birth, ID band Patient awake    Reviewed: Allergy & Precautions, NPO status , Patient's Chart, lab work & pertinent test results  Airway Mallampati: II       Dental   Pulmonary neg pulmonary ROS,  breath sounds clear to auscultation  Pulmonary exam normal       Cardiovascular hypertension, Pt. on medications Normal cardiovascular exam    Neuro/Psych Diabetic neuropathy negative psych ROS   GI/Hepatic hiatal hernia, GERD-  Medicated and Controlled,diverticulosis   Endo/Other  diabetes, Well Controlled, Type 2, Oral Hypoglycemic Agents  Renal/GU Renal InsufficiencyRenal disease  negative genitourinary   Musculoskeletal negative musculoskeletal ROS (+)   Abdominal   Peds negative pediatric ROS (+)  Hematology negative hematology ROS (+)   Anesthesia Other Findings   Reproductive/Obstetrics                             Anesthesia Physical Anesthesia Plan  ASA: III  Anesthesia Plan: General   Post-op Pain Management:    Induction: Intravenous  Airway Management Planned: Nasal Cannula  Additional Equipment:   Intra-op Plan:   Post-operative Plan:   Informed Consent: I have reviewed the patients History and Physical, chart, labs and discussed the procedure including the risks, benefits and alternatives for the proposed anesthesia with the patient or authorized representative who has indicated his/her understanding and acceptance.   Dental advisory given  Plan Discussed with: CRNA and Surgeon  Anesthesia Plan Comments:         Anesthesia Quick Evaluation

## 2015-02-19 NOTE — Anesthesia Postprocedure Evaluation (Signed)
  Anesthesia Post-op Note  Patient: Jeremy [name]  Procedure(s) Performed: Procedure(s): ESOPHAGOGASTRODUODENOSCOPY (EGD) WITH PROPOFOL (N/A)  Anesthesia type:General  Patient location: PACU  Post pain: Pain level controlled  Post assessment: Post-op Vital signs reviewed, Patient's Cardiovascular Status Stable, Respiratory Function Stable, Patent Airway and No signs of Nausea or vomiting  Post vital signs: Reviewed and stable  Last Vitals:  Filed Vitals:   02/15/15 1200  BP: 118/63  Pulse: 65  Temp:   Resp: 16    Level of consciousness: awake, alert  and patient cooperative  Complications: No apparent anesthesia complications

## 2015-02-20 DIAGNOSIS — H11823 Conjunctivochalasis, bilateral: Secondary | ICD-10-CM | POA: Diagnosis not present

## 2015-02-20 DIAGNOSIS — Z79899 Other long term (current) drug therapy: Secondary | ICD-10-CM | POA: Diagnosis not present

## 2015-02-20 DIAGNOSIS — H4052X3 Glaucoma secondary to other eye disorders, left eye, severe stage: Secondary | ICD-10-CM | POA: Diagnosis not present

## 2015-02-20 LAB — SURGICAL PATHOLOGY

## 2015-02-22 ENCOUNTER — Ambulatory Visit
Admission: RE | Admit: 2015-02-22 | Discharge: 2015-02-22 | Disposition: A | Discharge status: Home / Self Care | Payer: Medicare Other | Source: Ambulatory Visit | Attending: Gastroenterology | Admitting: Gastroenterology

## 2015-02-22 DIAGNOSIS — K766 Portal hypertension: Secondary | ICD-10-CM | POA: Diagnosis not present

## 2015-02-22 DIAGNOSIS — N281 Cyst of kidney, acquired: Secondary | ICD-10-CM | POA: Diagnosis not present

## 2015-02-22 DIAGNOSIS — K868 Other specified diseases of pancreas: Secondary | ICD-10-CM | POA: Diagnosis not present

## 2015-02-22 DIAGNOSIS — K449 Diaphragmatic hernia without obstruction or gangrene: Secondary | ICD-10-CM | POA: Diagnosis not present

## 2015-02-26 ENCOUNTER — Telehealth: Payer: Self-pay | Admitting: Family Medicine

## 2015-02-26 NOTE — Telephone Encounter (Signed)
Patient states that a form was faxed over on 02-26-15 for him to be able to get [facility]. They will not refill it until it is signed and faxed.

## 2015-02-28 NOTE — Telephone Encounter (Signed)
Paperwork faxed to pharmacy 

## 2015-03-06 DIAGNOSIS — I8500 Esophageal varices without bleeding: Secondary | ICD-10-CM | POA: Diagnosis not present

## 2015-03-28 ENCOUNTER — Ambulatory Visit (INDEPENDENT_AMBULATORY_CARE_PROVIDER_SITE_OTHER): Payer: Medicare Other

## 2015-03-28 DIAGNOSIS — Z23 Encounter for immunization: Secondary | ICD-10-CM | POA: Diagnosis not present

## 2015-03-29 ENCOUNTER — Other Ambulatory Visit: Payer: Self-pay | Admitting: Family Medicine

## 2015-04-05 DIAGNOSIS — H4423 Degenerative myopia, bilateral: Secondary | ICD-10-CM | POA: Diagnosis not present

## 2015-04-05 DIAGNOSIS — H4052X3 Glaucoma secondary to other eye disorders, left eye, severe stage: Secondary | ICD-10-CM | POA: Diagnosis not present

## 2015-04-05 DIAGNOSIS — H2702 Aphakia, left eye: Secondary | ICD-10-CM | POA: Diagnosis not present

## 2015-04-10 ENCOUNTER — Other Ambulatory Visit: Payer: Self-pay | Admitting: Gastroenterology

## 2015-04-10 DIAGNOSIS — I8500 Esophageal varices without bleeding: Secondary | ICD-10-CM

## 2015-04-10 DIAGNOSIS — R7989 Other specified abnormal findings of blood chemistry: Secondary | ICD-10-CM

## 2015-04-10 DIAGNOSIS — R945 Abnormal results of liver function studies: Secondary | ICD-10-CM

## 2015-04-10 DIAGNOSIS — K2950 Unspecified chronic gastritis without bleeding: Secondary | ICD-10-CM | POA: Diagnosis not present

## 2015-04-10 DIAGNOSIS — K2970 Gastritis, unspecified, without bleeding: Secondary | ICD-10-CM

## 2015-04-15 DIAGNOSIS — L851 Acquired keratosis [keratoderma] palmaris et plantaris: Secondary | ICD-10-CM | POA: Diagnosis not present

## 2015-04-15 DIAGNOSIS — E1142 Type 2 diabetes mellitus with diabetic polyneuropathy: Secondary | ICD-10-CM | POA: Diagnosis not present

## 2015-04-15 DIAGNOSIS — B351 Tinea unguium: Secondary | ICD-10-CM | POA: Diagnosis not present

## 2015-04-16 ENCOUNTER — Ambulatory Visit
Admission: RE | Admit: 2015-04-16 | Discharge: 2015-04-16 | Disposition: A | Discharge status: Home / Self Care | Payer: Medicare Other | Source: Ambulatory Visit | Attending: Gastroenterology | Admitting: Gastroenterology

## 2015-04-16 DIAGNOSIS — K2970 Gastritis, unspecified, without bleeding: Secondary | ICD-10-CM | POA: Diagnosis not present

## 2015-04-16 DIAGNOSIS — Z01812 Encounter for preprocedural laboratory examination: Secondary | ICD-10-CM | POA: Diagnosis not present

## 2015-04-16 DIAGNOSIS — Z9889 Other specified postprocedural states: Secondary | ICD-10-CM | POA: Insufficient documentation

## 2015-04-16 DIAGNOSIS — R918 Other nonspecific abnormal finding of lung field: Secondary | ICD-10-CM | POA: Diagnosis not present

## 2015-04-16 DIAGNOSIS — K449 Diaphragmatic hernia without obstruction or gangrene: Secondary | ICD-10-CM | POA: Insufficient documentation

## 2015-04-16 DIAGNOSIS — I864 Gastric varices: Secondary | ICD-10-CM | POA: Insufficient documentation

## 2015-04-16 DIAGNOSIS — R7989 Other specified abnormal findings of blood chemistry: Secondary | ICD-10-CM | POA: Diagnosis present

## 2015-04-16 DIAGNOSIS — K769 Liver disease, unspecified: Secondary | ICD-10-CM | POA: Insufficient documentation

## 2015-04-16 LAB — POCT I-STAT CREATININE: CREATININE: 0.8 mg/dL (ref 0.61–1.24)

## 2015-04-16 MED ORDER — IOHEXOL 300 MG/ML  SOLN
100.0000 mL | Freq: Once | INTRAMUSCULAR | Status: AC | PRN
  Administered 2015-04-16: 100 mL via INTRAVENOUS

## 2015-04-25 ENCOUNTER — Other Ambulatory Visit: Payer: Self-pay | Admitting: Family Medicine

## 2015-05-15 ENCOUNTER — Ambulatory Visit
Admission: RE | Admit: 2015-05-15 | Discharge: 2015-05-15 | Disposition: A | Discharge status: Home / Self Care | Payer: Medicare Other | Source: Ambulatory Visit | Attending: Family Medicine | Admitting: Family Medicine

## 2015-05-15 ENCOUNTER — Encounter: Payer: Self-pay | Admitting: Family Medicine

## 2015-05-15 ENCOUNTER — Telehealth: Payer: Self-pay | Admitting: Emergency Medicine

## 2015-05-15 ENCOUNTER — Ambulatory Visit (INDEPENDENT_AMBULATORY_CARE_PROVIDER_SITE_OTHER): Payer: Medicare Other | Admitting: Family Medicine

## 2015-05-15 VITALS — BP 124/70 | HR 85 | Temp 99.2°F | Resp 18 | Ht 76.0 in | Wt 221.1 lb

## 2015-05-15 DIAGNOSIS — J40 Bronchitis, not specified as acute or chronic: Secondary | ICD-10-CM | POA: Diagnosis not present

## 2015-05-15 DIAGNOSIS — R9389 Abnormal findings on diagnostic imaging of other specified body structures: Secondary | ICD-10-CM | POA: Insufficient documentation

## 2015-05-15 DIAGNOSIS — R938 Abnormal findings on diagnostic imaging of other specified body structures: Secondary | ICD-10-CM

## 2015-05-15 DIAGNOSIS — J0100 Acute maxillary sinusitis, unspecified: Secondary | ICD-10-CM | POA: Diagnosis not present

## 2015-05-15 DIAGNOSIS — R935 Abnormal findings on diagnostic imaging of other abdominal regions, including retroperitoneum: Secondary | ICD-10-CM | POA: Diagnosis not present

## 2015-05-15 DIAGNOSIS — R05 Cough: Secondary | ICD-10-CM | POA: Diagnosis not present

## 2015-05-15 MED ORDER — HYDROCOD POLST-CPM POLST ER 10-8 MG/5ML PO SUER: 5.0000 mL | Freq: Every evening | ORAL | Status: DC | PRN

## 2015-05-15 MED ORDER — LEVOFLOXACIN 500 MG PO TABS: 500.0000 mg | ORAL_TABLET | Freq: Every day | ORAL | Status: DC

## 2015-05-15 NOTE — Telephone Encounter (Signed)
Patient notified of CXR results. Patient to call back in 2 weeks for new order to be sent

## 2015-05-15 NOTE — Progress Notes (Signed)
Name: Jeremy [name]   MRN: [id number]    DOB: 06-08-1936   Date:05/15/2015       Progress Note  Subjective  Chief Complaint  Chief Complaint  Patient presents with  . URI    cough, congested, SOB, low grade fever, sweats for 10 days    HPI  Sinusitis  Patient presents with greater than 7 day history of nasal congestion and drainage which is purulent in color. There is tenderness over the sinuses. There has been fever to 99.2 along with some associated chills on occasion. Usage of over-the-counter medications is not been affected. There is also accompanying cough productive of purulent sputum.  Bronchitis  Patient presents with a greater than 1 week history of cough productive of purulent sputum. The cough is irritating and keep the patient awake at night. There has no fever or chills.  Over-the-counter meds And completely effective.    Diabetes  Patient has a 5 year history of diabetes. Is currently on a regimen of metformin and glimepiride along with Onglyza. Blood sugars since his infection are running  Abnormal CT scan  During workup with his gastroenterologist patient had a CT scan of the abdomen performed. There were nonspecific echogenic areas noted in the liver. On the endoscopy was noted to have some mild varices. The CT scan showed changes that were perhaps suggestive of early cirrhosis. The patient has never been a consumer of alcohol and the CT did not suggest [place].  Discussed with patient and his wife that these are probably only incidental findings. However they warrant repeat in the future if the GI or hematology systems just any other workup prior to that  Past Medical History  Diagnosis Date  . Small bowel obstruction ([place]) 1990's  . Diabetes mellitus without complication ([place])   . Hypertension   . Diverticula, colon 1980's  . High cholesterol   . Urination disorder   . History of hiatal hernia   . Hyperlipidemia   . Glaucoma   . Diabetic neuropathy ([place])    . Onychomycosis   . Diverticulosis   . Hemorrhoids     Social History  Substance Use Topics  . Smoking status: Never Smoker   . Smokeless tobacco: Never Used  . Alcohol Use: No     Current outpatient prescriptions:  .  brimonidine (ALPHAGAN P) 0.1 % SOLN, , Disp: , Rfl:  .  bisoprolol-hydrochlorothiazide (ZIAC) 10-6.25 MG tablet, TAKE ONE TABLET BY MOUTH TWICE DAILY AS NEEDED, Disp: 180 tablet, Rfl: 0 .  canagliflozin (INVOKANA) 300 MG TABS tablet, Take 300 mg by mouth daily before breakfast., Disp: , Rfl:  .  dorzolamide (TRUSOPT) 2 % ophthalmic solution, Place 1 drop into the left eye 2 (two) times daily., Disp: , Rfl:  .  esomeprazole (NEXIUM) 20 MG capsule, Take 20 mg by mouth daily before breakfast., Disp: , Rfl:  .  Exenatide ER (BYDUREON) 2 MG PEN, Inject 2 mg into the skin once a week., Disp: 4 each, Rfl: 11 .  finasteride (PROSCAR) 5 MG tablet, Take 5 mg by mouth daily., Disp: , Rfl:  .  glimepiride (AMARYL) 2 MG tablet, Take 4 mg by mouth daily before breakfast. , Disp: , Rfl:  .  loratadine (CLARITIN) 10 MG tablet, Take 10 mg by mouth daily., Disp: , Rfl:  .  meloxicam (MOBIC) 15 MG tablet, Take 15 mg by mouth daily., Disp: , Rfl:  .  metFORMIN (GLUCOPHAGE) 1000 MG tablet, TAKE ONE TABLET BY MOUTH TWICE DAILY, Disp:  180 tablet, Rfl: 0 .  olopatadine (PATANOL) 0.1 % ophthalmic solution, PATANOL, 0.1% (Ophthalmic Solution)  INSTILL 1 DROP INTO BOTH EYES TWICE DAILY AS NEEDED AT 6-8 HOUR INTERVALS. for 30 days  Quantity: 5.00;  Refills: 11   Ordered :22-Jul-2010  Ashok Norris MD;  Started 20-Sep-2008 Active Comments: Medication taken as needed. , Disp: , Rfl:  .  ranitidine (ZANTAC) 150 MG tablet, Take 150 mg by mouth at bedtime., Disp: , Rfl:  .  rosuvastatin (CRESTOR) 20 MG tablet, Take 20 mg by mouth daily., Disp: , Rfl:  .  saxagliptin HCl (ONGLYZA) 5 MG TABS tablet, Take 5 mg by mouth daily., Disp: , Rfl:  .  tamsulosin (FLOMAX) 0.4 MG CAPS, Take 0.4 mg by mouth 2 (two)  times daily., Disp: , Rfl:  .  timolol (TIMOPTIC-XR) 0.25 % ophthalmic gel-forming, , Disp: , Rfl:  .  valsartan (DIOVAN) 160 MG tablet, Take 160 mg by mouth daily., Disp: , Rfl:   Allergies  Allergen Reactions  . Actos [Pioglitazone] Other (See Comments)  . Demerol [Meperidine] Other (See Comments)    hypotension  . Dilaudid [Hydromorphone Hcl]   . Penicillins Rash    Review of Systems  Constitutional: Negative for fever, chills and weight loss.  HENT: Positive for congestion. Negative for hearing loss, sore throat and tinnitus.   Eyes: Negative for blurred vision, double vision and redness.  Respiratory: Positive for cough and sputum production. Negative for hemoptysis and shortness of breath.   Cardiovascular: Negative for chest pain, palpitations, orthopnea, claudication and leg swelling.       Chest wall pain from coughing  Gastrointestinal: Negative for heartburn, nausea, vomiting, diarrhea, constipation and blood in stool.  Genitourinary: Negative for dysuria, urgency, frequency and hematuria.  Musculoskeletal: Negative for myalgias, back pain, joint pain, falls and neck pain.  Skin: Negative for itching.  Neurological: Negative for dizziness, tingling, tremors, focal weakness, seizures, loss of consciousness, weakness and headaches.  Endo/Heme/Allergies: Does not bruise/bleed easily.  Psychiatric/Behavioral: Negative for depression and substance abuse. The patient is not nervous/anxious and does not have insomnia.      Objective  Filed Vitals:   05/15/15 0915  BP: 124/70  Pulse: 85  Temp: 99.2 F (37.3 C)  TempSrc: Oral  Resp: 18  Height: 6\' 4"  (1.93 m)  Weight: 221 lb 1.6 oz (100.29 kg)  SpO2: 94%     Physical Exam  Constitutional: He is oriented to person, place, and time.  Obese male with chronic cough  HENT:  Head: Normocephalic.  Eyes: EOM are normal. Pupils are equal, round, and reactive to light.  Neck: Normal range of motion. Neck supple. No  thyromegaly present.  Cardiovascular: Normal rate, regular rhythm and normal heart sounds.   No murmur heard. Pulmonary/Chest: Effort normal. No respiratory distress. He has no wheezes.  Few scattered rhonchi Mild costochondral tenderness to palpation particularly on the left  Abdominal: Soft. Bowel sounds are normal.  Musculoskeletal: Normal range of motion. He exhibits no edema.  Lymphadenopathy:    He has no cervical adenopathy.  Neurological: He is alert and oriented to person, place, and time. No cranial nerve deficit. Gait normal. Coordination normal.  Skin: Skin is warm and dry. No rash noted.  Psychiatric: Affect and judgment normal.      Assessment & Plan  1. Subacute maxillary sinusitis  - levofloxacin (LEVAQUIN) 500 MG tablet; Take 1 tablet (500 mg total) by mouth daily.  Dispense: 7 tablet; Refill: 0  2. Bronchitis  - levofloxacin (  LEVAQUIN) 500 MG tablet; Take 1 tablet (500 mg total) by mouth daily.  Dispense: 7 tablet; Refill: 0 - chlorpheniramine-HYDROcodone (TUSSIONEX PENNKINETIC ER) 10-8 MG/5ML SUER; Take 5 mLs by mouth at bedtime as needed for cough.  Dispense: 140 mL; Refill: 0 - DG Chest 2 View; Future  3. Abnormal CT of the chest History discussion with his GI. Would at the minimum consider repeat in 6 months  4. Abnormal abdominal CT scan History discussion with his GI. Would at the minimum consider repeat in 6 months

## 2015-05-21 ENCOUNTER — Ambulatory Visit: Payer: Medicare Other | Admitting: Family Medicine

## 2015-05-30 ENCOUNTER — Ambulatory Visit (INDEPENDENT_AMBULATORY_CARE_PROVIDER_SITE_OTHER): Payer: Medicare Other | Admitting: Family Medicine

## 2015-05-30 ENCOUNTER — Encounter: Payer: Self-pay | Admitting: Family Medicine

## 2015-05-30 VITALS — BP 122/68 | HR 65 | Temp 97.8°F | Resp 18 | Ht 76.0 in | Wt 227.1 lb

## 2015-05-30 DIAGNOSIS — K635 Polyp of colon: Secondary | ICD-10-CM

## 2015-05-30 DIAGNOSIS — H353190 Nonexudative age-related macular degeneration, unspecified eye, stage unspecified: Secondary | ICD-10-CM | POA: Diagnosis not present

## 2015-05-30 DIAGNOSIS — N182 Chronic kidney disease, stage 2 (mild): Secondary | ICD-10-CM

## 2015-05-30 DIAGNOSIS — J387 Other diseases of larynx: Secondary | ICD-10-CM | POA: Diagnosis not present

## 2015-05-30 DIAGNOSIS — Z87438 Personal history of other diseases of male genital organs: Secondary | ICD-10-CM | POA: Diagnosis not present

## 2015-05-30 DIAGNOSIS — E1169 Type 2 diabetes mellitus with other specified complication: Secondary | ICD-10-CM

## 2015-05-30 DIAGNOSIS — E1165 Type 2 diabetes mellitus with hyperglycemia: Secondary | ICD-10-CM

## 2015-05-30 DIAGNOSIS — J383 Other diseases of vocal cords: Secondary | ICD-10-CM

## 2015-05-30 DIAGNOSIS — I10 Essential (primary) hypertension: Secondary | ICD-10-CM

## 2015-05-30 DIAGNOSIS — K2100 Gastro-esophageal reflux disease with esophagitis, without bleeding: Secondary | ICD-10-CM

## 2015-05-30 DIAGNOSIS — E669 Obesity, unspecified: Secondary | ICD-10-CM | POA: Diagnosis not present

## 2015-05-30 DIAGNOSIS — Z8719 Personal history of other diseases of the digestive system: Secondary | ICD-10-CM | POA: Diagnosis not present

## 2015-05-30 DIAGNOSIS — H409 Unspecified glaucoma: Secondary | ICD-10-CM

## 2015-05-30 DIAGNOSIS — K219 Gastro-esophageal reflux disease without esophagitis: Secondary | ICD-10-CM

## 2015-05-30 DIAGNOSIS — E785 Hyperlipidemia, unspecified: Secondary | ICD-10-CM | POA: Insufficient documentation

## 2015-05-30 DIAGNOSIS — E1122 Type 2 diabetes mellitus with diabetic chronic kidney disease: Secondary | ICD-10-CM | POA: Diagnosis not present

## 2015-05-30 DIAGNOSIS — IMO0002 Reserved for concepts with insufficient information to code with codable children: Secondary | ICD-10-CM

## 2015-05-30 LAB — POCT GLYCOSYLATED HEMOGLOBIN (HGB A1C): Hemoglobin A1C: 7.8

## 2015-05-30 LAB — GLUCOSE, POCT (MANUAL RESULT ENTRY): POC Glucose: 157 mg/dl — AB (ref 70–99)

## 2015-05-30 NOTE — Progress Notes (Signed)
Name: Jeremy [name]   MRN: [id number]    DOB: September 23, 1935   Date:05/30/2015       Progress Note  Subjective  Chief Complaint  Chief Complaint  Patient presents with  . Hypertension    pt here for 4 month follow up  . Hyperlipidemia  . Diabetes    HPI  Diabetes  Patient presents for follow-up of diabetes which is present for over 5 years. Is currently on a regimen of by durian 2 mg weekly in the, 300 mg per day and glimepiride 2 mg daily and metformin thousand milligrams twice a day . Patient states he is not compliant with their diet and exercise. There's been no hypoglycemic episodes and there no polyuria polydipsia polyphagia. His average fasting glucoses been in the low around - with a high around - . There is no end organ disease.  Last diabetic eye exam was earlier this year.   Last visit with dietitian was last year. Last microalbumin was obtained                                                                                                                                                                                                                      .   Hypertension   Patient presents for follow-up of hypertension. It has been present for over 5 years.  Patient states that there is compliance with medical regimen which consists of Diovan 160 mg daily Ziac 10-6 0.25 mg daily . There is no end organ disease. Cardiac risk factors include hypertension hyperlipidemia and diabetes.  Exercise regimen consist of minimal walking .  Diet consist of some salt restriction .  Hyperlipidemia  Patient has a history of hyperlipidemia for over 5 years.  Current medical regimen consist of Crestor 20 mg daily at bedtime .  Compliance is good .  Diet and exercise are currently followed poorly .  Risk factors for cardiovascular disease include hyperlipidemia obesity sedentary lifestyle diabetes .   There have been no side effects from the medication.    Obesity  Patient has a long-standing  history of obesity for over 5 years. He has been referred to dietitian in the past and encouraged exercise but has done neither with consistency. Since last visit here he has gained an additional 6 pounds.  Past Medical History  Diagnosis Date  . Small bowel obstruction ([place]) 1990's  . Diabetes mellitus without complication ([place])   . Hypertension   . Diverticula, colon 1980's  .  High cholesterol   . Urination disorder   . History of hiatal hernia   . Hyperlipidemia   . Glaucoma   . Diabetic neuropathy ([place])   . Onychomycosis   . Diverticulosis   . Hemorrhoids     Social History  Substance Use Topics  . Smoking status: Never Smoker   . Smokeless tobacco: Never Used  . Alcohol Use: No     Current outpatient prescriptions:  .  dorzolamide-timolol (COSOPT) 22.3-6.8 MG/ML ophthalmic solution, 1 drop., Disp: , Rfl:  .  Alcaftadine (LASTACAFT) 0.25 % SOLN, Apply to eye., Disp: , Rfl:  .  bisoprolol-hydrochlorothiazide (ZIAC) 10-6.25 MG tablet, TAKE ONE TABLET BY MOUTH TWICE DAILY AS NEEDED, Disp: 180 tablet, Rfl: 0 .  brimonidine (ALPHAGAN P) 0.1 % SOLN, , Disp: , Rfl:  .  canagliflozin (INVOKANA) 300 MG TABS tablet, Take 300 mg by mouth daily before breakfast., Disp: , Rfl:  .  chlorpheniramine-HYDROcodone (TUSSIONEX PENNKINETIC ER) 10-8 MG/5ML SUER, Take 5 mLs by mouth at bedtime as needed for cough., Disp: 140 mL, Rfl: 0 .  dorzolamide (TRUSOPT) 2 % ophthalmic solution, Place 1 drop into the left eye 2 (two) times daily., Disp: , Rfl:  .  esomeprazole (NEXIUM) 20 MG capsule, Take 20 mg by mouth daily before breakfast., Disp: , Rfl:  .  Exenatide ER (BYDUREON) 2 MG PEN, Inject 2 mg into the skin once a week., Disp: 4 each, Rfl: 11 .  finasteride (PROSCAR) 5 MG tablet, Take 5 mg by mouth daily., Disp: , Rfl:  .  glimepiride (AMARYL) 2 MG tablet, Take 4 mg by mouth daily before breakfast. , Disp: , Rfl:  .  ketotifen (ZADITOR) 0.025 % ophthalmic solution, 1 drop., Disp: , Rfl:  .   levofloxacin (LEVAQUIN) 500 MG tablet, Take 1 tablet (500 mg total) by mouth daily., Disp: 7 tablet, Rfl: 0 .  loratadine (CLARITIN) 10 MG tablet, Take 10 mg by mouth daily., Disp: , Rfl:  .  meloxicam (MOBIC) 15 MG tablet, Take 15 mg by mouth daily., Disp: , Rfl:  .  metFORMIN (GLUCOPHAGE) 1000 MG tablet, TAKE ONE TABLET BY MOUTH TWICE DAILY, Disp: 180 tablet, Rfl: 0 .  olopatadine (PATANOL) 0.1 % ophthalmic solution, PATANOL, 0.1% (Ophthalmic Solution)  INSTILL 1 DROP INTO BOTH EYES TWICE DAILY AS NEEDED AT 6-8 HOUR INTERVALS. for 30 days  Quantity: 5.00;  Refills: 11   Ordered :22-Jul-2010  Ashok Norris MD;  Started 20-Sep-2008 Active Comments: Medication taken as needed. , Disp: , Rfl:  .  ranitidine (ZANTAC) 150 MG tablet, Take 150 mg by mouth at bedtime., Disp: , Rfl:  .  rosuvastatin (CRESTOR) 20 MG tablet, Take 20 mg by mouth daily., Disp: , Rfl:  .  saxagliptin HCl (ONGLYZA) 5 MG TABS tablet, Take 5 mg by mouth daily., Disp: , Rfl:  .  tamsulosin (FLOMAX) 0.4 MG CAPS, Take 0.4 mg by mouth 2 (two) times daily., Disp: , Rfl:  .  timolol (TIMOPTIC-XR) 0.25 % ophthalmic gel-forming, , Disp: , Rfl:  .  valsartan (DIOVAN) 160 MG tablet, Take 160 mg by mouth daily., Disp: , Rfl:   Allergies  Allergen Reactions  . Actos [Pioglitazone] Other (See Comments)  . Demerol [Meperidine] Other (See Comments)    hypotension  . Dilaudid [Hydromorphone Hcl]   . Penicillins Rash    Review of Systems  Constitutional: Negative for fever, chills and weight loss.  HENT: Negative for congestion, hearing loss, sore throat and tinnitus.   Eyes: Positive for blurred  vision. Negative for double vision and redness.  Respiratory: Negative for cough, hemoptysis and shortness of breath.   Cardiovascular: Positive for leg swelling. Negative for chest pain, palpitations, orthopnea and claudication.  Gastrointestinal: Negative for heartburn, nausea, vomiting, diarrhea, constipation and blood in stool.   Genitourinary: Positive for urgency and frequency. Negative for dysuria and hematuria.  Musculoskeletal: Positive for joint pain. Negative for myalgias, back pain, falls and neck pain.  Skin: Negative for itching.  Neurological: Negative for dizziness, tingling, tremors, focal weakness, seizures, loss of consciousness, weakness and headaches.  Endo/Heme/Allergies: Does not bruise/bleed easily.  Psychiatric/Behavioral: Negative for depression and substance abuse. The patient is not nervous/anxious and does not have insomnia.      Objective  Filed Vitals:   05/30/15 1351  BP: 122/68  Pulse: 65  Temp: 97.8 F (36.6 C)  Resp: 18  Height: 6\' 4"  (1.93 m)  Weight: 227 lb 1 oz (102.995 kg)  SpO2: 96%     Physical Exam  Constitutional: He is oriented to person, place, and time.  Obese and in no acute distress  HENT:  Head: Normocephalic.  Eyes: EOM are normal. Pupils are equal, round, and reactive to light.  Neck: Normal range of motion. Neck supple. No thyromegaly present.  Cardiovascular: Normal rate, regular rhythm, normal heart sounds and intact distal pulses.   No murmur heard. Pulmonary/Chest: Effort normal and breath sounds normal. No respiratory distress. He has no wheezes.  Abdominal: Soft. Bowel sounds are normal.  Musculoskeletal: Normal range of motion. He exhibits no edema.  Lymphadenopathy:    He has no cervical adenopathy.  Neurological: He is alert and oriented to person, place, and time. No cranial nerve deficit. Gait normal. Coordination normal.  Skin: Skin is warm and dry. No rash noted.  Psychiatric: Affect and judgment normal.      Assessment & Plan  1. Type 2 diabetes mellitus with other specified complication (HCC) Better controlled - POCT HgB A1C - POCT Glucose (CBG) - POCT UA - Microalbumin  2. Uncontrolled type 2 diabetes mellitus with stage 2 chronic kidney disease, without long-term current use of insulin ([place]) As above  3. Benign essential  HTN Well-controlled  4. Hyperlipidemia Continue statin  5. Laryngopharyngeal reflux Per ENT and is stable  6. Reflux esophagitis Per ENT and GI are stable  7. Obesity Again encouraged diet and exercise  8. Nonexudative age-related macular degeneration Stable per ophthalmologist  9. Glaucoma Stable per ophthalmologist - ketotifen (ZADITOR) 0.025 % ophthalmic solution; 1 drop. - dorzolamide-timolol (COSOPT) 22.3-6.8 MG/ML ophthalmic solution; 1 drop. - Alcaftadine (LASTACAFT) 0.25 % SOLN; Apply to eye.  21. H/O male genital system disorder Per urologist is stable per GI   11. History of digestive disease Per GI  12. Colon polyp Per GI  13. Atrophy of vocal cord Per ENT

## 2015-05-31 DIAGNOSIS — E119 Type 2 diabetes mellitus without complications: Secondary | ICD-10-CM | POA: Insufficient documentation

## 2015-06-17 ENCOUNTER — Other Ambulatory Visit: Payer: Self-pay | Admitting: Family Medicine

## 2015-07-01 ENCOUNTER — Other Ambulatory Visit: Payer: Self-pay | Admitting: Family Medicine

## 2015-07-17 DIAGNOSIS — E1142 Type 2 diabetes mellitus with diabetic polyneuropathy: Secondary | ICD-10-CM | POA: Diagnosis not present

## 2015-07-17 DIAGNOSIS — B351 Tinea unguium: Secondary | ICD-10-CM | POA: Diagnosis not present

## 2015-07-17 DIAGNOSIS — L851 Acquired keratosis [keratoderma] palmaris et plantaris: Secondary | ICD-10-CM | POA: Diagnosis not present

## 2015-07-21 ENCOUNTER — Other Ambulatory Visit: Payer: Self-pay | Admitting: Family Medicine

## 2015-08-07 ENCOUNTER — Other Ambulatory Visit: Payer: Self-pay | Admitting: Gastroenterology

## 2015-08-07 DIAGNOSIS — K7460 Unspecified cirrhosis of liver: Secondary | ICD-10-CM | POA: Diagnosis not present

## 2015-08-07 DIAGNOSIS — R945 Abnormal results of liver function studies: Secondary | ICD-10-CM

## 2015-08-07 DIAGNOSIS — R188 Other ascites: Principal | ICD-10-CM

## 2015-08-07 DIAGNOSIS — K769 Liver disease, unspecified: Secondary | ICD-10-CM

## 2015-08-07 DIAGNOSIS — I8500 Esophageal varices without bleeding: Secondary | ICD-10-CM

## 2015-08-07 DIAGNOSIS — R7989 Other specified abnormal findings of blood chemistry: Secondary | ICD-10-CM

## 2015-08-09 DIAGNOSIS — Z961 Presence of intraocular lens: Secondary | ICD-10-CM | POA: Diagnosis not present

## 2015-08-09 DIAGNOSIS — H4052X3 Glaucoma secondary to other eye disorders, left eye, severe stage: Secondary | ICD-10-CM | POA: Diagnosis not present

## 2015-08-09 DIAGNOSIS — H11823 Conjunctivochalasis, bilateral: Secondary | ICD-10-CM | POA: Diagnosis not present

## 2015-08-16 ENCOUNTER — Ambulatory Visit: Payer: Medicare Other

## 2015-08-28 ENCOUNTER — Encounter: Payer: Self-pay | Admitting: Family Medicine

## 2015-08-28 ENCOUNTER — Ambulatory Visit (INDEPENDENT_AMBULATORY_CARE_PROVIDER_SITE_OTHER): Payer: Medicare Other | Admitting: Family Medicine

## 2015-08-28 ENCOUNTER — Ambulatory Visit: Payer: Medicare Other

## 2015-08-28 VITALS — BP 124/76 | HR 68 | Temp 98.0°F | Resp 16 | Ht 76.0 in | Wt 223.9 lb

## 2015-08-28 DIAGNOSIS — G453 Amaurosis fugax: Secondary | ICD-10-CM

## 2015-08-28 DIAGNOSIS — E1165 Type 2 diabetes mellitus with hyperglycemia: Secondary | ICD-10-CM

## 2015-08-28 DIAGNOSIS — E1140 Type 2 diabetes mellitus with diabetic neuropathy, unspecified: Secondary | ICD-10-CM | POA: Diagnosis not present

## 2015-08-28 DIAGNOSIS — IMO0002 Reserved for concepts with insufficient information to code with codable children: Secondary | ICD-10-CM

## 2015-08-28 DIAGNOSIS — H539 Unspecified visual disturbance: Secondary | ICD-10-CM | POA: Insufficient documentation

## 2015-08-28 DIAGNOSIS — R2990 Unspecified symptoms and signs involving the nervous system: Secondary | ICD-10-CM | POA: Insufficient documentation
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
## 2015-08-29 LAB — POCT GLYCOSYLATED HEMOGLOBIN (HGB A1C): HEMOGLOBIN A1C: 13

## 2015-08-29 LAB — COMPREHENSIVE METABOLIC PANEL
ALT: 16 IU/L (ref 0–44)
AST: 14 IU/L (ref 0–40)
Albumin/Globulin Ratio: 2.3 — ABNORMAL HIGH (ref 1.2–2.2)
Albumin: 4.5 g/dL (ref 3.5–4.8)
Alkaline Phosphatase: 43 IU/L (ref 39–117)
BILIRUBIN TOTAL: 0.5 mg/dL (ref 0.0–1.2)
BUN/Creatinine Ratio: 28 — ABNORMAL HIGH (ref 10–22)
BUN: 22 mg/dL (ref 8–27)
CALCIUM: 9.6 mg/dL (ref 8.6–10.2)
CHLORIDE: 95 mmol/L — AB (ref 96–106)
CO2: 26 mmol/L (ref 18–29)
Creatinine, Ser: 0.8 mg/dL (ref 0.76–1.27)
GFR calc non Af Amer: 86 mL/min/{1.73_m2} (ref >59)
GFR, EST AFRICAN AMERICAN: 100 mL/min/{1.73_m2} (ref >59)
GLUCOSE: 385 mg/dL — AB (ref 65–99)
Globulin, Total: 2 g/dL (ref 1.5–4.5)
POTASSIUM: 4.4 mmol/L (ref 3.5–5.2)
Sodium: 137 mmol/L (ref 134–144)
TOTAL PROTEIN: 6.5 g/dL (ref 6.0–8.5)

## 2015-08-29 LAB — CBC WITH DIFFERENTIAL/PLATELET
BASOS ABS: 0 10*3/uL (ref 0.0–0.2)
Basos: 0 %
EOS (ABSOLUTE): 0.5 10*3/uL — AB (ref 0.0–0.4)
Eos: 10 %
HEMOGLOBIN: 15 g/dL (ref 12.6–17.7)
Hematocrit: 43.7 % (ref 37.5–51.0)
IMMATURE GRANS (ABS): 0 10*3/uL (ref 0.0–0.1)
Immature Granulocytes: 0 %
LYMPHS: 25 %
Lymphocytes Absolute: 1.2 10*3/uL (ref 0.7–3.1)
MCH: 33 pg (ref 26.6–33.0)
MCHC: 34.3 g/dL (ref 31.5–35.7)
MCV: 96 fL (ref 79–97)
MONOCYTES: 13 %
Monocytes Absolute: 0.6 10*3/uL (ref 0.1–0.9)
NEUTROS PCT: 52 %
Neutrophils Absolute: 2.4 10*3/uL (ref 1.4–7.0)
PLATELETS: 144 10*3/uL — AB (ref 150–379)
RBC: 4.55 x10E6/uL (ref 4.14–5.80)
RDW: 12.9 % (ref 12.3–15.4)
WBC: 4.7 10*3/uL (ref 3.4–10.8)

## 2015-08-30 ENCOUNTER — Ambulatory Visit: Payer: Medicare Other

## 2015-08-30 ENCOUNTER — Ambulatory Visit
Admission: RE | Admit: 2015-08-30 | Discharge: 2015-08-30 | Disposition: A | Discharge status: Home / Self Care | Payer: Medicare Other | Source: Ambulatory Visit | Attending: Gastroenterology | Admitting: Gastroenterology

## 2015-08-30 DIAGNOSIS — K7460 Unspecified cirrhosis of liver: Secondary | ICD-10-CM | POA: Diagnosis not present

## 2015-08-30 DIAGNOSIS — R937 Abnormal findings on diagnostic imaging of other parts of musculoskeletal system: Secondary | ICD-10-CM | POA: Diagnosis not present

## 2015-08-30 DIAGNOSIS — R188 Other ascites: Secondary | ICD-10-CM

## 2015-08-30 DIAGNOSIS — K8689 Other specified diseases of pancreas: Secondary | ICD-10-CM | POA: Diagnosis not present

## 2015-08-30 MED ORDER — GADOBENATE DIMEGLUMINE 529 MG/ML IV SOLN
20.0000 mL | Freq: Once | INTRAVENOUS | Status: AC | PRN
  Administered 2015-08-30: 20 mL via INTRAVENOUS

## 2015-09-02 ENCOUNTER — Ambulatory Visit
Admission: RE | Admit: 2015-09-02 | Discharge: 2015-09-02 | Disposition: A | Discharge status: Home / Self Care | Payer: Medicare Other | Source: Ambulatory Visit | Attending: Gastroenterology | Admitting: Gastroenterology

## 2015-09-02 DIAGNOSIS — R188 Other ascites: Secondary | ICD-10-CM

## 2015-09-02 DIAGNOSIS — K7460 Unspecified cirrhosis of liver: Secondary | ICD-10-CM | POA: Diagnosis not present

## 2015-09-03 ENCOUNTER — Ambulatory Visit
Admission: RE | Admit: 2015-09-03 | Discharge: 2015-09-03 | Disposition: A | Discharge status: Home / Self Care | Payer: Medicare Other | Source: Ambulatory Visit | Attending: Family Medicine | Admitting: Family Medicine

## 2015-09-03 DIAGNOSIS — Z8673 Personal history of transient ischemic attack (TIA), and cerebral infarction without residual deficits: Secondary | ICD-10-CM | POA: Diagnosis not present

## 2015-09-03 DIAGNOSIS — G453 Amaurosis fugax: Secondary | ICD-10-CM | POA: Diagnosis not present

## 2015-09-03 DIAGNOSIS — H538 Other visual disturbances: Secondary | ICD-10-CM | POA: Diagnosis not present

## 2015-09-05 ENCOUNTER — Other Ambulatory Visit: Payer: Self-pay | Admitting: Family Medicine

## 2015-09-05 DIAGNOSIS — R2990 Unspecified symptoms and signs involving the nervous system: Secondary | ICD-10-CM

## 2015-09-15 ENCOUNTER — Other Ambulatory Visit: Payer: Self-pay | Admitting: Family Medicine

## 2015-09-17 DIAGNOSIS — Z794 Long term (current) use of insulin: Secondary | ICD-10-CM | POA: Diagnosis not present

## 2015-09-17 DIAGNOSIS — H539 Unspecified visual disturbance: Secondary | ICD-10-CM | POA: Diagnosis not present

## 2015-09-17 DIAGNOSIS — Z8673 Personal history of transient ischemic attack (TIA), and cerebral infarction without residual deficits: Secondary | ICD-10-CM

## 2015-09-17 DIAGNOSIS — E1165 Type 2 diabetes mellitus with hyperglycemia: Secondary | ICD-10-CM | POA: Diagnosis not present

## 2015-09-17 HISTORY — DX: Personal history of transient ischemic attack (TIA), and cerebral infarction without residual deficits: Z86.73

## 2015-09-19 ENCOUNTER — Other Ambulatory Visit: Payer: Self-pay | Admitting: Neurology

## 2015-09-19 DIAGNOSIS — H539 Unspecified visual disturbance: Secondary | ICD-10-CM

## 2015-09-19 DIAGNOSIS — Z8673 Personal history of transient ischemic attack (TIA), and cerebral infarction without residual deficits: Secondary | ICD-10-CM

## 2015-09-24 ENCOUNTER — Ambulatory Visit
Admission: RE | Admit: 2015-09-24 | Discharge: 2015-09-24 | Disposition: A | Discharge status: Home / Self Care | Payer: Medicare Other | Source: Ambulatory Visit | Attending: Neurology | Admitting: Neurology

## 2015-09-24 DIAGNOSIS — H539 Unspecified visual disturbance: Secondary | ICD-10-CM | POA: Insufficient documentation

## 2015-09-24 DIAGNOSIS — H538 Other visual disturbances: Secondary | ICD-10-CM | POA: Diagnosis not present

## 2015-09-24 DIAGNOSIS — Z8673 Personal history of transient ischemic attack (TIA), and cerebral infarction without residual deficits: Secondary | ICD-10-CM | POA: Diagnosis not present

## 2015-09-29 ENCOUNTER — Other Ambulatory Visit: Payer: Self-pay | Admitting: Family Medicine

## 2015-09-30 ENCOUNTER — Ambulatory Visit: Payer: Medicare Other | Admitting: Family Medicine

## 2015-10-04 DIAGNOSIS — R569 Unspecified convulsions: Secondary | ICD-10-CM | POA: Diagnosis not present

## 2015-10-04 DIAGNOSIS — H539 Unspecified visual disturbance: Secondary | ICD-10-CM | POA: Diagnosis not present

## 2015-10-09 DIAGNOSIS — K7460 Unspecified cirrhosis of liver: Secondary | ICD-10-CM | POA: Diagnosis not present

## 2015-10-11 DIAGNOSIS — N401 Enlarged prostate with lower urinary tract symptoms: Secondary | ICD-10-CM | POA: Diagnosis not present

## 2015-10-11 DIAGNOSIS — N138 Other obstructive and reflux uropathy: Secondary | ICD-10-CM | POA: Diagnosis not present

## 2015-10-11 DIAGNOSIS — N323 Diverticulum of bladder: Secondary | ICD-10-CM | POA: Diagnosis not present

## 2015-10-11 DIAGNOSIS — Z6827 Body mass index (BMI) 27.0-27.9, adult: Secondary | ICD-10-CM | POA: Diagnosis not present

## 2015-10-11 DIAGNOSIS — R339 Retention of urine, unspecified: Secondary | ICD-10-CM | POA: Insufficient documentation

## 2015-10-16 DIAGNOSIS — E1142 Type 2 diabetes mellitus with diabetic polyneuropathy: Secondary | ICD-10-CM | POA: Diagnosis not present

## 2015-10-16 DIAGNOSIS — B351 Tinea unguium: Secondary | ICD-10-CM | POA: Diagnosis not present

## 2015-10-21 DIAGNOSIS — E1165 Type 2 diabetes mellitus with hyperglycemia: Secondary | ICD-10-CM | POA: Diagnosis not present

## 2015-10-21 DIAGNOSIS — K7460 Unspecified cirrhosis of liver: Secondary | ICD-10-CM | POA: Diagnosis not present

## 2015-10-27 ENCOUNTER — Other Ambulatory Visit: Payer: Self-pay | Admitting: Family Medicine

## 2015-10-31 ENCOUNTER — Telehealth: Payer: Self-pay | Admitting: Family Medicine

## 2015-10-31 NOTE — Telephone Encounter (Signed)
PT IS AT PHARM AND SAYS HE NEEDS  METFORMIN 90 DAY SUPPLY REFILLED.  Pharm is [facility] on [address]. HE WAS NOT BEING VERY PATIENT AND WAS SAYING THAT WE CALLED THE WRONG THING IN BUT WHEN SPEAKING WITH A TECH PERSON THEY SAID THAT IT JUST HAS NOT BEEN CALLED IN FOR A REFILL

## 2015-11-19 DIAGNOSIS — E1165 Type 2 diabetes mellitus with hyperglycemia: Secondary | ICD-10-CM | POA: Diagnosis not present

## 2015-11-19 DIAGNOSIS — Z79899 Other long term (current) drug therapy: Secondary | ICD-10-CM | POA: Diagnosis not present

## 2015-12-05 ENCOUNTER — Ambulatory Visit (INDEPENDENT_AMBULATORY_CARE_PROVIDER_SITE_OTHER): Payer: Medicare Other | Admitting: Family Medicine

## 2015-12-05 VITALS — BP 118/76 | HR 70 | Temp 98.9°F | Resp 18 | Ht 76.0 in | Wt 220.0 lb

## 2015-12-05 DIAGNOSIS — E1122 Type 2 diabetes mellitus with diabetic chronic kidney disease: Secondary | ICD-10-CM | POA: Diagnosis not present

## 2015-12-05 DIAGNOSIS — E1165 Type 2 diabetes mellitus with hyperglycemia: Secondary | ICD-10-CM | POA: Diagnosis not present

## 2015-12-05 DIAGNOSIS — J387 Other diseases of larynx: Secondary | ICD-10-CM

## 2015-12-05 DIAGNOSIS — K219 Gastro-esophageal reflux disease without esophagitis: Secondary | ICD-10-CM

## 2015-12-05 DIAGNOSIS — Z794 Long term (current) use of insulin: Secondary | ICD-10-CM

## 2015-12-05 DIAGNOSIS — R2681 Unsteadiness on feet: Secondary | ICD-10-CM

## 2015-12-05 DIAGNOSIS — IMO0001 Reserved for inherently not codable concepts without codable children: Secondary | ICD-10-CM

## 2015-12-05 DIAGNOSIS — E559 Vitamin D deficiency, unspecified: Secondary | ICD-10-CM | POA: Insufficient documentation

## 2015-12-05 DIAGNOSIS — E785 Hyperlipidemia, unspecified: Secondary | ICD-10-CM

## 2015-12-05 DIAGNOSIS — N182 Chronic kidney disease, stage 2 (mild): Secondary | ICD-10-CM | POA: Diagnosis not present

## 2015-12-05 DIAGNOSIS — I10 Essential (primary) hypertension: Secondary | ICD-10-CM | POA: Diagnosis not present

## 2015-12-05 LAB — VITAMIN B12: Vitamin B-12: 1030 pg/mL (ref 200–1100)

## 2015-12-05 LAB — LIPID PANEL
Cholesterol: 106 mg/dL — ABNORMAL LOW (ref 125–200)
HDL: 42 mg/dL (ref >=40)
LDL CALC: 14 mg/dL (ref <130)
Total CHOL/HDL Ratio: 2.5 Ratio (ref <=5.0)
Triglycerides: 249 mg/dL — ABNORMAL HIGH (ref <150)
VLDL: 50 mg/dL — ABNORMAL HIGH (ref <30)

## 2015-12-05 LAB — GLUCOSE, POCT (MANUAL RESULT ENTRY): POC Glucose: 150 mg/dl — AB (ref 70–99)

## 2015-12-05 LAB — POCT GLYCOSYLATED HEMOGLOBIN (HGB A1C): HEMOGLOBIN A1C: 12.9

## 2015-12-05 LAB — TSH: TSH: 1.42 mIU/L (ref 0.40–4.50)

## 2015-12-06 LAB — VITAMIN D 25 HYDROXY (VIT D DEFICIENCY, FRACTURES): VIT D 25 HYDROXY: 61 ng/mL (ref 30–100)

## 2015-12-07 ENCOUNTER — Encounter: Payer: Self-pay | Admitting: Family Medicine

## 2015-12-07 ENCOUNTER — Other Ambulatory Visit: Payer: Self-pay | Admitting: Family Medicine

## 2015-12-07 MED ORDER — BISOPROLOL-HYDROCHLOROTHIAZIDE 10-6.25 MG PO TABS: 1.0000 | ORAL_TABLET | Freq: Every day | ORAL | Status: DC

## 2015-12-07 NOTE — Progress Notes (Signed)
Date:  12/05/2015   Name:  Jeremy [name]   DOB:  November 16, 1935   MRN:  [id number]  PCP:  Ashok Norris, MD    Chief Complaint: Diabetes and Medication Refill   History of Present Illness:  This is a 80 y.o. male seen for three month f/u. T2DM on insulin, Bydureon, Invokana, and metformin, followed by endo HTN on Ziac and Diovan, BPH on Proscar and Flomax, HLD on Crestor, takes Mobic and Zantac prn only. On Nexium daily for hx esophageal granulomas. On vit D supplement in past. No acute concerns.  Review of Systems:  Review of Systems  Constitutional: Negative for fever and fatigue.  Respiratory: Negative for cough and shortness of breath.   Cardiovascular: Negative for chest pain and leg swelling.  Endocrine: Negative for polyuria.  Genitourinary: Negative for difficulty urinating.  Neurological: Negative for syncope and light-headedness.    Patient Active Problem List   Diagnosis Date Noted  . Vitamin D deficiency 12/05/2015  . Visual disturbance 08/28/2015  . Vision changes 08/28/2015  . Transient vision disturbance of both eyes 08/28/2015  . Stroke-like symptoms 08/28/2015  . Type 2 diabetes mellitus ([place]) 05/31/2015  . Hyperlipidemia 05/30/2015  . Abnormal CT of the chest 05/15/2015  . Abnormal abdominal CT scan 05/15/2015  . H/O male genital system disorder 01/08/2015  . Chronic kidney disease (CKD), stage I 01/08/2015  . Type II diabetes mellitus with renal manifestations, uncontrolled ([place]) 01/08/2015  . History of digestive disease 01/08/2015  . Abnormal presence of protein in urine 01/08/2015  . Uncontrolled type 2 diabetes with renal manifestation ([place]) 01/08/2015  . Type 2 diabetes, uncontrolled, with neuropathy ([place]) 01/08/2015  . Obesity 01/08/2015  . Colon polyp 11/16/2014  . Atrophy of vocal cord 02/07/2014  . Ventral hernia 03/24/2013  . Incisional hernia, without obstruction or gangrene 03/23/2013  . Bladder retention 09/22/2012  . Degenerative  disorder of eye 08/12/2012  . Nonexudative age-related macular degeneration 07/15/2012  . Anisometropia 04/08/2012  . H/O RD (retinal detachment) 04/08/2012  . Pseudoaphakia 04/08/2012  . Bladder diverticulum 02/12/2012  . Laryngopharyngeal reflux 02/08/2012  . Difficulty speaking 02/08/2012  . Absence of lens 01/13/2012  . Glaucoma 03/18/2010  . Osteopenia 03/18/2010  . Reflux esophagitis 02/07/2008  . Benign essential HTN 02/03/2007    Prior to Admission medications   Medication Sig Start Date End Date Taking? Authorizing Provider  Alcaftadine (LASTACAFT) 0.25 % SOLN Apply to eye.    Historical Provider, MD  bisoprolol-hydrochlorothiazide (ZIAC) 10-6.25 MG tablet TAKE ONE TABLET BY MOUTH TWICE DAILY AS NEEDED Patient not taking: Reported on 12/05/2015 09/30/15   Ashok Norris, MD  brimonidine (ALPHAGAN P) 0.1 % SOLN  02/20/15   Historical Provider, MD  dorzolamide-timolol (COSOPT) 22.3-6.8 MG/ML ophthalmic solution 1 drop. 04/04/14   Historical Provider, MD  esomeprazole (NEXIUM) 20 MG capsule Take 20 mg by mouth daily before breakfast.    Historical Provider, MD  Exenatide ER (BYDUREON) 2 MG PEN Inject 2 mg into the skin once a week. 01/08/15   Ashok Norris, MD  finasteride (PROSCAR) 5 MG tablet Take 5 mg by mouth daily.    Historical Provider, MD  INVOKANA 300 MG TABS tablet TAKE ONE TABLET BY MOUTH ONCE DAILY 09/30/15   Ashok Norris, MD  ketotifen (ZADITOR) 0.025 % ophthalmic solution 1 drop.    Historical Provider, MD  meloxicam (MOBIC) 15 MG tablet Take 15 mg by mouth daily as needed.    Historical Provider, MD  metFORMIN (GLUCOPHAGE) 1000 MG tablet TAKE  ONE TABLET BY MOUTH TWICE DAILY 10/31/15   Ashok Norris, MD  olopatadine (PATANOL) 0.1 % ophthalmic solution PATANOL, 0.1% (Ophthalmic Solution)  INSTILL 1 DROP INTO BOTH EYES TWICE DAILY AS NEEDED AT 6-8 HOUR INTERVALS. for 30 days  Quantity: 5.00;  Refills: 11   Ordered :22-Jul-2010  Ashok Norris MD;  Started  20-Sep-2008 Active Comments: Medication taken as needed.  09/20/08   Historical Provider, MD  ranitidine (ZANTAC) 150 MG tablet Take 150 mg by mouth daily as needed.    Historical Provider, MD  rosuvastatin (CRESTOR) 20 MG tablet Take 20 mg by mouth daily.    Historical Provider, MD  saxagliptin HCl (ONGLYZA) 5 MG TABS tablet Take 5 mg by mouth daily.    Historical Provider, MD  tamsulosin (FLOMAX) 0.4 MG CAPS Take 0.4 mg by mouth 2 (two) times daily.    Historical Provider, MD  timolol (TIMOPTIC-XR) 0.25 % ophthalmic gel-forming  02/03/07   Historical Provider, MD  valsartan (DIOVAN) 160 MG tablet Take 160 mg by mouth daily.    Historical Provider, MD    Allergies  Allergen Reactions  . Demerol [Meperidine] Other (See Comments) and Anaphylaxis    hypotension  . Actos [Pioglitazone] Other (See Comments)  . Dilaudid [Hydromorphone Hcl]   . Dapagliflozin Rash  . Penicillins Rash    Past Surgical History  Procedure Laterality Date  . Glaucoma surgery Left [place]  . Colon surgery Left 1980's  . Colonoscopy w/ polypectomy  1990's    Dr Sharlet Salina  . Ureter surgery  2013    Dr Jacqlyn Larsen  . Bladder diverticulectomy  2013  . Hemorroidectomy  1980's    [facility]  . Inguinal hernia repair Bilateral 1960's  . Larynx surgery  1990    3 times  . Hernia repair      [facility], Dr Sharlet Salina  . Appendectomy    . Tonsillectomy    . Eye surgery    . Retinal detachment surgery Right   . Colonoscopy with propofol N/A 12/21/2014    Procedure: COLONOSCOPY WITH PROPOFOL;  Surgeon: Lollie Sails, MD;  Location: [facility] ENDOSCOPY;  Service: Endoscopy;  Laterality: N/A;  . Esophagogastroduodenoscopy (egd) with propofol N/A 02/15/2015    Procedure: ESOPHAGOGASTRODUODENOSCOPY (EGD) WITH PROPOFOL;  Surgeon: Lollie Sails, MD;  Location: [facility] ENDOSCOPY;  Service: Endoscopy;  Laterality: N/A;    Social History  Substance Use Topics  . Smoking status: Never Smoker   . Smokeless tobacco: Never  Used  . Alcohol Use: No    Family History  Problem Relation Age of Onset  . Diabetes Mother   . Cataracts Father   . Cataracts Mother   . Glaucoma Father   . Cancer Sister     gallbladder    Medication list has been reviewed and updated.  Physical Examination: BP 118/76 mmHg  Pulse 70  Temp(Src) 98.9 F (37.2 C)  Resp 18  Ht 6\' 4"  (1.93 m)  Wt 220 lb (99.791 kg)  BMI 26.79 kg/m2  SpO2 96%  Physical Exam  Constitutional: He appears well-developed and well-nourished.  Cardiovascular: Normal rate, regular rhythm and normal heart sounds.   Pulmonary/Chest: Effort normal and breath sounds normal.  Musculoskeletal:  1+ BLE edema  Neurological: He is alert.  Gait slightly wide-based  Skin: Skin is warm and dry.  Psychiatric: He has a normal mood and affect. His behavior is normal.  Nursing note and vitals reviewed.   Assessment and Plan:  1. Uncontrolled type 2  diabetes mellitus without complication, with long-term current use of insulin (HCC) A1c 12.9%, on multiple PO/SQ meds, endo titrating insulin - POCT HgB A1C - POCT Glucose (CBG) - TSH  2. Benign essential HTN Well controlled on Ziac/Diovan  3. Laryngopharyngeal reflux Continue Nexium daily  4. Hyperlipidemia On Crestor - Lipid Profile  5. Gait instability - B12  6. Vitamin D deficiency - Vitamin D (25 hydroxy)  Return in about 6 months (around 06/05/2016).  Satira Anis. [facility]  12/07/2015

## 2015-12-11 ENCOUNTER — Ambulatory Visit: Payer: Medicare Other

## 2015-12-12 ENCOUNTER — Other Ambulatory Visit: Payer: Self-pay | Admitting: Gastroenterology

## 2015-12-12 DIAGNOSIS — R911 Solitary pulmonary nodule: Secondary | ICD-10-CM

## 2015-12-12 DIAGNOSIS — I8500 Esophageal varices without bleeding: Secondary | ICD-10-CM

## 2015-12-12 DIAGNOSIS — K7460 Unspecified cirrhosis of liver: Secondary | ICD-10-CM

## 2015-12-13 ENCOUNTER — Ambulatory Visit
Admission: RE | Admit: 2015-12-13 | Discharge: 2015-12-13 | Disposition: A | Discharge status: Home / Self Care | Payer: Medicare Other | Source: Ambulatory Visit | Attending: Gastroenterology | Admitting: Gastroenterology

## 2015-12-13 DIAGNOSIS — R911 Solitary pulmonary nodule: Secondary | ICD-10-CM | POA: Diagnosis not present

## 2015-12-13 DIAGNOSIS — I8500 Esophageal varices without bleeding: Secondary | ICD-10-CM | POA: Diagnosis not present

## 2015-12-13 DIAGNOSIS — I700 Atherosclerosis of aorta: Secondary | ICD-10-CM | POA: Insufficient documentation

## 2015-12-13 DIAGNOSIS — K7460 Unspecified cirrhosis of liver: Secondary | ICD-10-CM | POA: Diagnosis not present

## 2015-12-13 DIAGNOSIS — I7789 Other specified disorders of arteries and arterioles: Secondary | ICD-10-CM | POA: Diagnosis not present

## 2015-12-13 DIAGNOSIS — K449 Diaphragmatic hernia without obstruction or gangrene: Secondary | ICD-10-CM | POA: Diagnosis not present

## 2015-12-13 DIAGNOSIS — R918 Other nonspecific abnormal finding of lung field: Secondary | ICD-10-CM | POA: Diagnosis not present

## 2015-12-13 LAB — POCT I-STAT CREATININE: Creatinine, Ser: 0.7 mg/dL (ref 0.61–1.24)

## 2015-12-13 MED ORDER — IOPAMIDOL (ISOVUE-300) INJECTION 61%
75.0000 mL | Freq: Once | INTRAVENOUS | Status: AC | PRN
  Administered 2015-12-13: 75 mL via INTRAVENOUS

## 2015-12-21 ENCOUNTER — Other Ambulatory Visit: Payer: Self-pay | Admitting: Family Medicine

## 2015-12-23 ENCOUNTER — Other Ambulatory Visit: Payer: Self-pay | Admitting: Family Medicine

## 2015-12-23 NOTE — Telephone Encounter (Signed)
Pt needs refill on Ziac and his diabetes meds to be sent to [address]. Pt normally gets 3 month supply

## 2015-12-24 NOTE — Telephone Encounter (Signed)
Pt scheduled  

## 2015-12-27 ENCOUNTER — Ambulatory Visit (INDEPENDENT_AMBULATORY_CARE_PROVIDER_SITE_OTHER): Payer: Medicare Other | Admitting: Family Medicine

## 2015-12-27 ENCOUNTER — Encounter: Payer: Self-pay | Admitting: Family Medicine

## 2015-12-27 VITALS — BP 118/70 | HR 70 | Temp 98.3°F | Resp 18 | Ht 76.0 in | Wt 225.2 lb

## 2015-12-27 DIAGNOSIS — E119 Type 2 diabetes mellitus without complications: Secondary | ICD-10-CM | POA: Diagnosis not present

## 2015-12-27 DIAGNOSIS — I10 Essential (primary) hypertension: Secondary | ICD-10-CM

## 2015-12-27 DIAGNOSIS — Z794 Long term (current) use of insulin: Secondary | ICD-10-CM | POA: Diagnosis not present

## 2015-12-27 MED ORDER — BISOPROLOL-HYDROCHLOROTHIAZIDE 10-6.25 MG PO TABS: 1.0000 | ORAL_TABLET | Freq: Every day | ORAL | Status: DC

## 2015-12-27 MED ORDER — EXENATIDE ER 2 MG ~~LOC~~ PEN: 2.0000 mg | PEN_INJECTOR | SUBCUTANEOUS | Status: DC

## 2015-12-27 NOTE — Progress Notes (Signed)
Name: Jeremy [name]   MRN: [id number]    DOB: [birth year]   Date:12/27/2015       Progress Note  Subjective  Chief Complaint  Chief Complaint  Patient presents with  . Medication Refill  . Discuss Labs    Hypertension This is a chronic problem. The problem is controlled. Pertinent negatives include no chest pain, headaches, malaise/fatigue or palpitations. Past treatments include beta blockers and diuretics. There is no history of kidney disease, CAD/MI, CVA or heart failure.  Diabetes He presents for his follow-up diabetic visit. He has type 2 diabetes mellitus. His disease course has been worsening. Pertinent negatives for hypoglycemia include no headaches. Pertinent negatives for diabetes include no chest pain. Symptoms are worsening (Now being followed by Endocrinology.). Pertinent negatives for diabetic complications include no CVA, heart disease or nephropathy. Current diabetic treatment includes intensive insulin program and oral agent (triple therapy). His breakfast blood glucose range is generally 140-180 mg/dl. An ACE inhibitor/angiotensin II receptor blocker is being taken.     Past Medical History  Diagnosis Date  . Small bowel obstruction ([place]) 1990's  . Diabetes mellitus without complication ([place])   . Hypertension   . Diverticula, colon 1980's  . High cholesterol   . Urination disorder   . History of hiatal hernia   . Hyperlipidemia   . Glaucoma   . Diabetic neuropathy ([place])   . Onychomycosis   . Diverticulosis   . Hemorrhoids     Past Surgical History  Procedure Laterality Date  . Glaucoma surgery Left [place]  . Colon surgery Left 1980's  . Colonoscopy w/ polypectomy  1990's    Dr Sharlet Salina  . Ureter surgery  2013    Dr Jacqlyn Larsen  . Bladder diverticulectomy  2013  . Hemorroidectomy  1980's    [facility]  . Inguinal hernia repair Bilateral 1960's  . Larynx surgery  1990    3 times  . Hernia repair      [facility], Dr Sharlet Salina  . Appendectomy     . Tonsillectomy    . Eye surgery    . Retinal detachment surgery Right   . Colonoscopy with propofol N/A 12/21/2014    Procedure: COLONOSCOPY WITH PROPOFOL;  Surgeon: Lollie Sails, MD;  Location: [facility] ENDOSCOPY;  Service: Endoscopy;  Laterality: N/A;  . Esophagogastroduodenoscopy (egd) with propofol N/A 02/15/2015    Procedure: ESOPHAGOGASTRODUODENOSCOPY (EGD) WITH PROPOFOL;  Surgeon: Lollie Sails, MD;  Location: [facility] ENDOSCOPY;  Service: Endoscopy;  Laterality: N/A;    Family History  Problem Relation Age of Onset  . Diabetes Mother   . Cataracts Father   . Cataracts Mother   . Glaucoma Father   . Cancer Sister     gallbladder    Social History   Social History  . Marital Status: Married    Spouse Name: N/A  . Number of Children: N/A  . Years of Education: N/A   Occupational History  . Not on file.   Social History Main Topics  . Smoking status: Never Smoker   . Smokeless tobacco: Never Used  . Alcohol Use: No  . Drug Use: No  . Sexual Activity: Not on file   Other Topics Concern  . Not on file   Social History Narrative     Current outpatient prescriptions:  .  Alcaftadine (LASTACAFT) 0.25 % SOLN, Apply to eye., Disp: , Rfl:  .  bisoprolol-hydrochlorothiazide (ZIAC) 10-6.25 MG tablet, Take 1 tablet by mouth daily., Disp: 180  tablet, Rfl: 0 .  brimonidine (ALPHAGAN P) 0.1 % SOLN, , Disp: , Rfl:  .  dorzolamide-timolol (COSOPT) 22.3-6.8 MG/ML ophthalmic solution, 1 drop., Disp: , Rfl:  .  esomeprazole (NEXIUM) 20 MG capsule, Take 20 mg by mouth daily before breakfast., Disp: , Rfl:  .  Exenatide ER (BYDUREON) 2 MG PEN, Inject 2 mg into the skin once a week., Disp: 4 each, Rfl: 11 .  finasteride (PROSCAR) 5 MG tablet, Take 5 mg by mouth daily., Disp: , Rfl:  .  INVOKANA 300 MG TABS tablet, TAKE ONE TABLET BY MOUTH ONCE DAILY, Disp: 90 tablet, Rfl: 0 .  ketotifen (ZADITOR) 0.025 % ophthalmic solution, 1 drop., Disp: , Rfl:  .  meloxicam (MOBIC) 15 MG  tablet, Take 15 mg by mouth daily as needed., Disp: , Rfl:  .  metFORMIN (GLUCOPHAGE) 1000 MG tablet, TAKE ONE TABLET BY MOUTH TWICE DAILY, Disp: 180 tablet, Rfl: 0 .  olopatadine (PATANOL) 0.1 % ophthalmic solution, PATANOL, 0.1% (Ophthalmic Solution)  INSTILL 1 DROP INTO BOTH EYES TWICE DAILY AS NEEDED AT 6-8 HOUR INTERVALS. for 30 days  Quantity: 5.00;  Refills: 11   Ordered :22-Jul-2010  Ashok Norris MD;  Started 20-Sep-2008 Active Comments: Medication taken as needed. , Disp: , Rfl:  .  ranitidine (ZANTAC) 150 MG tablet, Take 150 mg by mouth daily as needed., Disp: , Rfl:  .  tamsulosin (FLOMAX) 0.4 MG CAPS, Take 0.4 mg by mouth 2 (two) times daily., Disp: , Rfl:  .  timolol (TIMOPTIC-XR) 0.25 % ophthalmic gel-forming, , Disp: , Rfl:  .  valsartan (DIOVAN) 160 MG tablet, Take 160 mg by mouth daily., Disp: , Rfl:   Allergies  Allergen Reactions  . Demerol [Meperidine] Other (See Comments) and Anaphylaxis    hypotension  . Actos [Pioglitazone] Other (See Comments)  . Dilaudid [Hydromorphone Hcl]   . Dapagliflozin Rash  . Penicillins Rash     Review of Systems  Constitutional: Negative for malaise/fatigue.  Cardiovascular: Negative for chest pain and palpitations.  Gastrointestinal: Negative for abdominal pain.  Neurological: Negative for headaches.    Objective  Filed Vitals:   12/27/15 1010  BP: 118/70  Pulse: 70  Temp: 98.3 F (36.8 C)  TempSrc: Oral  Resp: 18  Height: 6\' 4"  (1.93 m)  Weight: 225 lb 3.2 oz (102.15 kg)  SpO2: 93%    Physical Exam  Constitutional: He is oriented to person, place, and time and well-developed, well-nourished, and in no distress.  HENT:  Head: Normocephalic and atraumatic.  Cardiovascular: Normal rate and regular rhythm.   No murmur heard. Pulmonary/Chest: Effort normal and breath sounds normal. He has no wheezes.  Abdominal: Soft. Bowel sounds are normal.  Neurological: He is alert and oriented to person, place, and time.   Psychiatric: Mood, memory, affect and judgment normal.  Nursing note and vitals reviewed.       Assessment & Plan  1. Type 2 diabetes mellitus without complication, with long-term current use of insulin (HCC) Now being managed by endocrinology, refill for Bydureon on provided - Exenatide ER (BYDUREON) 2 MG PEN; Inject 2 mg into the skin once a week.  Dispense: 4 each; Refill: 2  2. Benign essential HTN BP stable and controlled on antihypertensive therapy - bisoprolol-hydrochlorothiazide (ZIAC) 10-6.25 MG tablet; Take 1 tablet by mouth daily.  Dispense: 90 tablet; Refill: 0   Halen Antenucci Asad A. [facility] 12/27/2015 10:16 AM

## 2015-12-30 ENCOUNTER — Other Ambulatory Visit: Payer: Self-pay | Admitting: Family Medicine

## 2015-12-30 ENCOUNTER — Telehealth: Payer: Self-pay | Admitting: Family Medicine

## 2015-12-30 NOTE — Telephone Encounter (Signed)
Prescription for by Almyra Free on was transmitted to her pharmacy on Friday, July 14.

## 2015-12-30 NOTE — Telephone Encounter (Signed)
Patient was prescribed bydureon. He is suppose to take this medication on Sundays and he missed his dose. The pharmacy sent something over pertaining to the medication. He is asking that you please take care of the prescription today.

## 2016-01-01 DIAGNOSIS — H4052X3 Glaucoma secondary to other eye disorders, left eye, severe stage: Secondary | ICD-10-CM | POA: Diagnosis not present

## 2016-01-01 DIAGNOSIS — H11823 Conjunctivochalasis, bilateral: Secondary | ICD-10-CM | POA: Diagnosis not present

## 2016-01-01 DIAGNOSIS — Z961 Presence of intraocular lens: Secondary | ICD-10-CM | POA: Diagnosis not present

## 2016-01-20 ENCOUNTER — Other Ambulatory Visit: Payer: Self-pay | Admitting: Family Medicine

## 2016-01-22 DIAGNOSIS — B351 Tinea unguium: Secondary | ICD-10-CM | POA: Diagnosis not present

## 2016-01-22 DIAGNOSIS — E1142 Type 2 diabetes mellitus with diabetic polyneuropathy: Secondary | ICD-10-CM | POA: Diagnosis not present

## 2016-01-22 DIAGNOSIS — L851 Acquired keratosis [keratoderma] palmaris et plantaris: Secondary | ICD-10-CM | POA: Diagnosis not present

## 2016-02-17 ENCOUNTER — Other Ambulatory Visit: Payer: Self-pay | Admitting: Family Medicine

## 2016-02-17 DIAGNOSIS — I10 Essential (primary) hypertension: Secondary | ICD-10-CM

## 2016-02-19 ENCOUNTER — Telehealth: Payer: Self-pay | Admitting: Family Medicine

## 2016-02-19 DIAGNOSIS — J387 Other diseases of larynx: Secondary | ICD-10-CM | POA: Diagnosis not present

## 2016-02-19 DIAGNOSIS — J384 Edema of larynx: Secondary | ICD-10-CM | POA: Diagnosis not present

## 2016-02-19 MED ORDER — VALSARTAN 160 MG PO TABS: 160.0000 mg | ORAL_TABLET | Freq: Every day | ORAL | 0 refills | Status: DC

## 2016-02-19 NOTE — Telephone Encounter (Signed)
Prescription for Diovan is sent to patient's pharmacy

## 2016-02-19 NOTE — Addendum Note (Signed)
Addended byManuella Ghazi, Cheikh Bramble A A on: 02/19/2016 07:06 PM   Modules accepted: Orders

## 2016-02-20 NOTE — Telephone Encounter (Signed)
DONE

## 2016-02-20 NOTE — Telephone Encounter (Signed)
Spoke to pharmacy medication is ready for pick up

## 2016-02-20 NOTE — Telephone Encounter (Signed)
Patient verbally informed °

## 2016-02-21 DIAGNOSIS — S76319A Strain of muscle, fascia and tendon of the posterior muscle group at thigh level, unspecified thigh, initial encounter: Secondary | ICD-10-CM | POA: Insufficient documentation

## 2016-02-21 DIAGNOSIS — S76312A Strain of muscle, fascia and tendon of the posterior muscle group at thigh level, left thigh, initial encounter: Secondary | ICD-10-CM | POA: Diagnosis not present

## 2016-03-11 ENCOUNTER — Ambulatory Visit: Payer: Medicare Other

## 2016-03-12 ENCOUNTER — Ambulatory Visit (INDEPENDENT_AMBULATORY_CARE_PROVIDER_SITE_OTHER): Payer: Medicare Other

## 2016-03-12 DIAGNOSIS — Z23 Encounter for immunization: Secondary | ICD-10-CM

## 2016-03-18 DIAGNOSIS — H539 Unspecified visual disturbance: Secondary | ICD-10-CM | POA: Diagnosis not present

## 2016-03-18 DIAGNOSIS — E1165 Type 2 diabetes mellitus with hyperglycemia: Secondary | ICD-10-CM | POA: Diagnosis not present

## 2016-03-20 ENCOUNTER — Telehealth: Payer: Self-pay | Admitting: Family Medicine

## 2016-03-20 DIAGNOSIS — Z794 Long term (current) use of insulin: Principal | ICD-10-CM

## 2016-03-20 DIAGNOSIS — E119 Type 2 diabetes mellitus without complications: Secondary | ICD-10-CM

## 2016-03-26 MED ORDER — EXENATIDE ER 2 MG ~~LOC~~ PEN: 2.0000 mg | PEN_INJECTOR | SUBCUTANEOUS | 2 refills | Status: DC

## 2016-03-26 NOTE — Telephone Encounter (Signed)
Pt has appointment for tomorrow 03/27/16 he is asking that you refill bydureon today because he will not have any for in the morning. Please send to [facility] [address]

## 2016-03-26 NOTE — Addendum Note (Signed)
Addended byManuella Ghazi, Presly Steinruck A A on: 03/26/2016 07:29 PM   Modules accepted: Orders

## 2016-03-27 ENCOUNTER — Ambulatory Visit (INDEPENDENT_AMBULATORY_CARE_PROVIDER_SITE_OTHER): Payer: Medicare Other | Admitting: Family Medicine

## 2016-03-27 ENCOUNTER — Encounter: Payer: Self-pay | Admitting: Family Medicine

## 2016-03-27 VITALS — BP 120/75 | HR 67 | Temp 97.9°F | Resp 16 | Ht 76.0 in | Wt 225.0 lb

## 2016-03-27 DIAGNOSIS — I10 Essential (primary) hypertension: Secondary | ICD-10-CM

## 2016-03-27 DIAGNOSIS — E1165 Type 2 diabetes mellitus with hyperglycemia: Secondary | ICD-10-CM | POA: Diagnosis not present

## 2016-03-27 DIAGNOSIS — E785 Hyperlipidemia, unspecified: Secondary | ICD-10-CM

## 2016-03-27 DIAGNOSIS — E1140 Type 2 diabetes mellitus with diabetic neuropathy, unspecified: Secondary | ICD-10-CM

## 2016-03-27 DIAGNOSIS — IMO0002 Reserved for concepts with insufficient information to code with codable children: Secondary | ICD-10-CM

## 2016-03-27 DIAGNOSIS — R600 Localized edema: Secondary | ICD-10-CM | POA: Diagnosis not present

## 2016-03-27 LAB — POCT UA - MICROALBUMIN: MICROALBUMIN (UR) POC: 20 mg/L

## 2016-03-27 LAB — COMPLETE METABOLIC PANEL WITH GFR
ALT: 20 U/L (ref 9–46)
AST: 17 U/L (ref 10–35)
Albumin: 4.4 g/dL (ref 3.6–5.1)
Alkaline Phosphatase: 32 U/L — ABNORMAL LOW (ref 40–115)
BILIRUBIN TOTAL: 0.7 mg/dL (ref 0.2–1.2)
BUN: 22 mg/dL (ref 7–25)
CALCIUM: 9.6 mg/dL (ref 8.6–10.3)
CO2: 29 mmol/L (ref 20–31)
CREATININE: 0.79 mg/dL (ref 0.70–1.18)
Chloride: 101 mmol/L (ref 98–110)
GFR, EST NON AFRICAN AMERICAN: 87 mL/min (ref >=60)
Glucose, Bld: 173 mg/dL — ABNORMAL HIGH (ref 65–99)
Potassium: 3.9 mmol/L (ref 3.5–5.3)
Sodium: 140 mmol/L (ref 135–146)
TOTAL PROTEIN: 6.8 g/dL (ref 6.1–8.1)

## 2016-03-27 LAB — POCT GLYCOSYLATED HEMOGLOBIN (HGB A1C): HEMOGLOBIN A1C: 8.4

## 2016-03-27 LAB — GLUCOSE, POCT (MANUAL RESULT ENTRY): POC Glucose: 167 mg/dl — AB (ref 70–99)

## 2016-03-27 MED ORDER — ROSUVASTATIN CALCIUM 10 MG PO TABS: 10.0000 mg | ORAL_TABLET | Freq: Every day | ORAL | 0 refills | Status: DC

## 2016-03-27 MED ORDER — METFORMIN HCL 1000 MG PO TABS: 1000.0000 mg | ORAL_TABLET | Freq: Two times a day (BID) | ORAL | 1 refills | Status: DC

## 2016-03-27 MED ORDER — HYDROCHLOROTHIAZIDE 25 MG PO TABS: 25.0000 mg | ORAL_TABLET | Freq: Every day | ORAL | 3 refills | Status: DC

## 2016-03-27 MED ORDER — BISOPROLOL-HYDROCHLOROTHIAZIDE 10-6.25 MG PO TABS: 1.0000 | ORAL_TABLET | Freq: Every day | ORAL | 0 refills | Status: DC

## 2016-03-27 NOTE — Telephone Encounter (Signed)
Left voice mail informing patient prescription is at the pharmacy.

## 2016-03-27 NOTE — Progress Notes (Signed)
Name: Jeremy [name]   MRN: [id number]    DOB: 1935/11/03   Date:03/27/2016       Progress Note  Subjective  Chief Complaint  Chief Complaint  Patient presents with  . Follow-up    3 mo    Diabetes  He presents for his follow-up diabetic visit. He has type 2 diabetes mellitus. His disease course has been improving. There are no hypoglycemic associated symptoms. Pertinent negatives for hypoglycemia include no headaches or nervousness/anxiousness. Pertinent negatives for diabetes include no blurred vision, no chest pain, no fatigue, no polydipsia and no polyuria. Current diabetic treatment includes intensive insulin program and oral agent (monotherapy) (Now being seen by Endocrinology, on Basal insulin.). His breakfast blood glucose range is generally 140-180 mg/dl. An ACE inhibitor/angiotensin II receptor blocker is being taken.  Hypertension  This is a chronic problem. The problem is unchanged. The problem is controlled. Associated symptoms include peripheral edema (taking HCTZ 25 mg daily prescribed by Neurologist, edema is improving.). Pertinent negatives include no blurred vision, chest pain, headaches, palpitations or shortness of breath. Past treatments include beta blockers.  Hyperlipidemia  This is a chronic problem. The problem is controlled. Pertinent negatives include no chest pain, leg pain, myalgias or shortness of breath. Current antihyperlipidemic treatment includes statins.      Past Medical History:  Diagnosis Date  . Diabetes mellitus without complication ([place])   . Diabetic neuropathy ([place])   . Diverticula, colon 1980's  . Diverticulosis   . Glaucoma   . Hemorrhoids   . High cholesterol   . History of hiatal hernia   . Hyperlipidemia   . Hypertension   . Onychomycosis   . Small bowel obstruction 1990's  . Urination disorder     Past Surgical History:  Procedure Laterality Date  . APPENDECTOMY    . BLADDER DIVERTICULECTOMY  2013  . COLON SURGERY Left  1980's  . COLONOSCOPY W/ POLYPECTOMY  1990's   Dr Sharlet Salina  . COLONOSCOPY WITH PROPOFOL N/A 12/21/2014   Procedure: COLONOSCOPY WITH PROPOFOL;  Surgeon: Lollie Sails, MD;  Location: [facility] ENDOSCOPY;  Service: Endoscopy;  Laterality: N/A;  . ESOPHAGOGASTRODUODENOSCOPY (EGD) WITH PROPOFOL N/A 02/15/2015   Procedure: ESOPHAGOGASTRODUODENOSCOPY (EGD) WITH PROPOFOL;  Surgeon: Lollie Sails, MD;  Location: [facility] ENDOSCOPY;  Service: Endoscopy;  Laterality: N/A;  . EYE SURGERY    . GLAUCOMA SURGERY Left [place]     [facility], Dr Sharlet Salina  . INGUINAL HERNIA REPAIR Bilateral 1960's  . LARYNX SURGERY  1990   3 times  . RETINAL DETACHMENT SURGERY Right   . TONSILLECTOMY    . URETER SURGERY  2013   Dr Jacqlyn Larsen    Family History  Problem Relation Age of Onset  . Diabetes Mother   . Cataracts Mother   . Cataracts Father   . Glaucoma Father   . Cancer Sister     gallbladder    Social History   Social History  . Marital status: Married    Spouse name: N/A  . Number of children: N/A  . Years of education: N/A   Occupational History  . Not on file.   Social History Main Topics  . Smoking status: Never Smoker  . Smokeless tobacco: Never Used  . Alcohol use No  . Drug use: No  . Sexual activity: No   Other Topics Concern  . Not on file   Social History Narrative  .  No narrative on file     Current Outpatient Prescriptions:  .  Alcaftadine (LASTACAFT) 0.25 % SOLN, Apply to eye., Disp: , Rfl:  .  bisoprolol-hydrochlorothiazide (ZIAC) 10-6.25 MG tablet, Take 1 tablet by mouth daily., Disp: 90 tablet, Rfl: 0 .  brimonidine (ALPHAGAN P) 0.1 % SOLN, , Disp: , Rfl:  .  CRESTOR 20 MG tablet, TAKE ONE TABLET BY MOUTH AT BEDTIME, Disp: 90 tablet, Rfl: 0 .  dorzolamide-timolol (COSOPT) 22.3-6.8 MG/ML ophthalmic solution, 1 drop., Disp: , Rfl:  .  esomeprazole (NEXIUM) 20 MG capsule, Take 20 mg by mouth daily before  breakfast., Disp: , Rfl:  .  Exenatide ER (BYDUREON) 2 MG PEN, Inject 2 mg into the skin once a week., Disp: 4 each, Rfl: 2 .  finasteride (PROSCAR) 5 MG tablet, Take 5 mg by mouth daily., Disp: , Rfl:  .  INVOKANA 300 MG TABS tablet, TAKE ONE TABLET BY MOUTH ONCE DAILY, Disp: 90 tablet, Rfl: 0 .  ketotifen (ZADITOR) 0.025 % ophthalmic solution, 1 drop., Disp: , Rfl:  .  meloxicam (MOBIC) 15 MG tablet, Take 15 mg by mouth daily as needed., Disp: , Rfl:  .  metFORMIN (GLUCOPHAGE) 1000 MG tablet, TAKE ONE TABLET BY MOUTH TWICE DAILY, Disp: 180 tablet, Rfl: 0 .  olopatadine (PATANOL) 0.1 % ophthalmic solution, PATANOL, 0.1% (Ophthalmic Solution)  INSTILL 1 DROP INTO BOTH EYES TWICE DAILY AS NEEDED AT 6-8 HOUR INTERVALS. for 30 days  Quantity: 5.00;  Refills: 11   Ordered :22-Jul-2010  Ashok Norris MD;  Started 20-Sep-2008 Active Comments: Medication taken as needed. , Disp: , Rfl:  .  ranitidine (ZANTAC) 150 MG tablet, Take 150 mg by mouth daily as needed., Disp: , Rfl:  .  tamsulosin (FLOMAX) 0.4 MG CAPS, Take 0.4 mg by mouth 2 (two) times daily., Disp: , Rfl:  .  timolol (TIMOPTIC-XR) 0.25 % ophthalmic gel-forming, , Disp: , Rfl:  .  valsartan (DIOVAN) 160 MG tablet, Take 1 tablet (160 mg total) by mouth daily., Disp: 90 tablet, Rfl: 0  Allergies  Allergen Reactions  . Demerol [Meperidine] Other (See Comments) and Anaphylaxis    hypotension  . Actos [Pioglitazone] Other (See Comments) and Hives  . Dilaudid [Hydromorphone Hcl]   . Hydromorphone     Other reaction(s): Unknown MAKES BLOOD PRESSURE BOTTOM OUT  . Dapagliflozin Rash  . Penicillins Rash     Review of Systems  Constitutional: Negative for chills, fatigue and fever.  Eyes: Negative for blurred vision.  Respiratory: Negative for shortness of breath.   Cardiovascular: Positive for leg swelling. Negative for chest pain and palpitations.  Musculoskeletal: Negative for myalgias.  Neurological: Negative for headaches.   Endo/Heme/Allergies: Negative for polydipsia.  Psychiatric/Behavioral: Negative for depression. The patient is not nervous/anxious.      Objective  Vitals:   03/27/16 1011  BP: 120/75  Pulse: 67  Resp: 16  Temp: 97.9 F (36.6 C)  TempSrc: Oral  SpO2: 96%  Weight: 225 lb (102.1 kg)  Height: 6\' 4"  (1.93 m)    Physical Exam  Constitutional: He is oriented to person, place, and time and well-developed, well-nourished, and in no distress.  HENT:  Head: Normocephalic and atraumatic.  Cardiovascular: Normal rate, regular rhythm, S1 normal, S2 normal and normal heart sounds.   No murmur heard. Pulmonary/Chest: Effort normal and breath sounds normal. No respiratory distress. He has no wheezes. He has no rhonchi.  Abdominal: Soft. Bowel sounds are normal. There is no tenderness.  Musculoskeletal:  Right ankle: He exhibits swelling.       Left ankle: He exhibits swelling.  Trace pitting edema bilateral Lower extremities.  Neurological: He is alert and oriented to person, place, and time.  Psychiatric: Mood, memory, affect and judgment normal.  Nursing note and vitals reviewed.    Assessment & Plan  1. Benign essential HTN BP stable on present therapy - bisoprolol-hydrochlorothiazide (ZIAC) 10-6.25 MG tablet; Take 1 tablet by mouth daily.  Dispense: 90 tablet; Refill: 0  2. Bilateral lower extremity edema Improving, continue on HCTZ 25 mg daily - hydrochlorothiazide (HYDRODIURIL) 25 MG tablet; Take 1 tablet (25 mg total) by mouth daily.  Dispense: 90 tablet; Refill: 3  3. Type 2 diabetes, uncontrolled, with neuropathy (HCC) A1c improving, currently at 8.4%, he is now being seen by endocrinology. Refill for metformin provided - metFORMIN (GLUCOPHAGE) 1000 MG tablet; Take 1 tablet (1,000 mg total) by mouth 2 (two) times daily.  Dispense: 180 tablet; Refill: 1 - POCT HgB A1C - POCT Glucose (CBG) - POCT UA - Microalbumin  4. Hyperlipidemia, unspecified hyperlipidemia  type Decrease Crestor to 10 mg at bedtime. - rosuvastatin (CRESTOR) 10 MG tablet; Take 1 tablet (10 mg total) by mouth at bedtime.  Dispense: 90 tablet; Refill: 0 - COMPLETE METABOLIC PANEL WITH GFR  Rube Sanchez [facility] 03/27/2016 10:25 AM

## 2016-03-28 IMAGING — CR DG CHEST 2V
1 series · 2 of 2 positions shown · non-contrast
Comparison: PA and lateral chest x-ray October 14, 2012

CLINICAL DATA: One week of productive cough especially when supine,
history of diabetes, nonsmoker.

EXAM:
CHEST  2 VIEW

[Series 1: dg chest 2 view · 0.14mm/px · 2 of 2 slices shown]
[im 1/2]
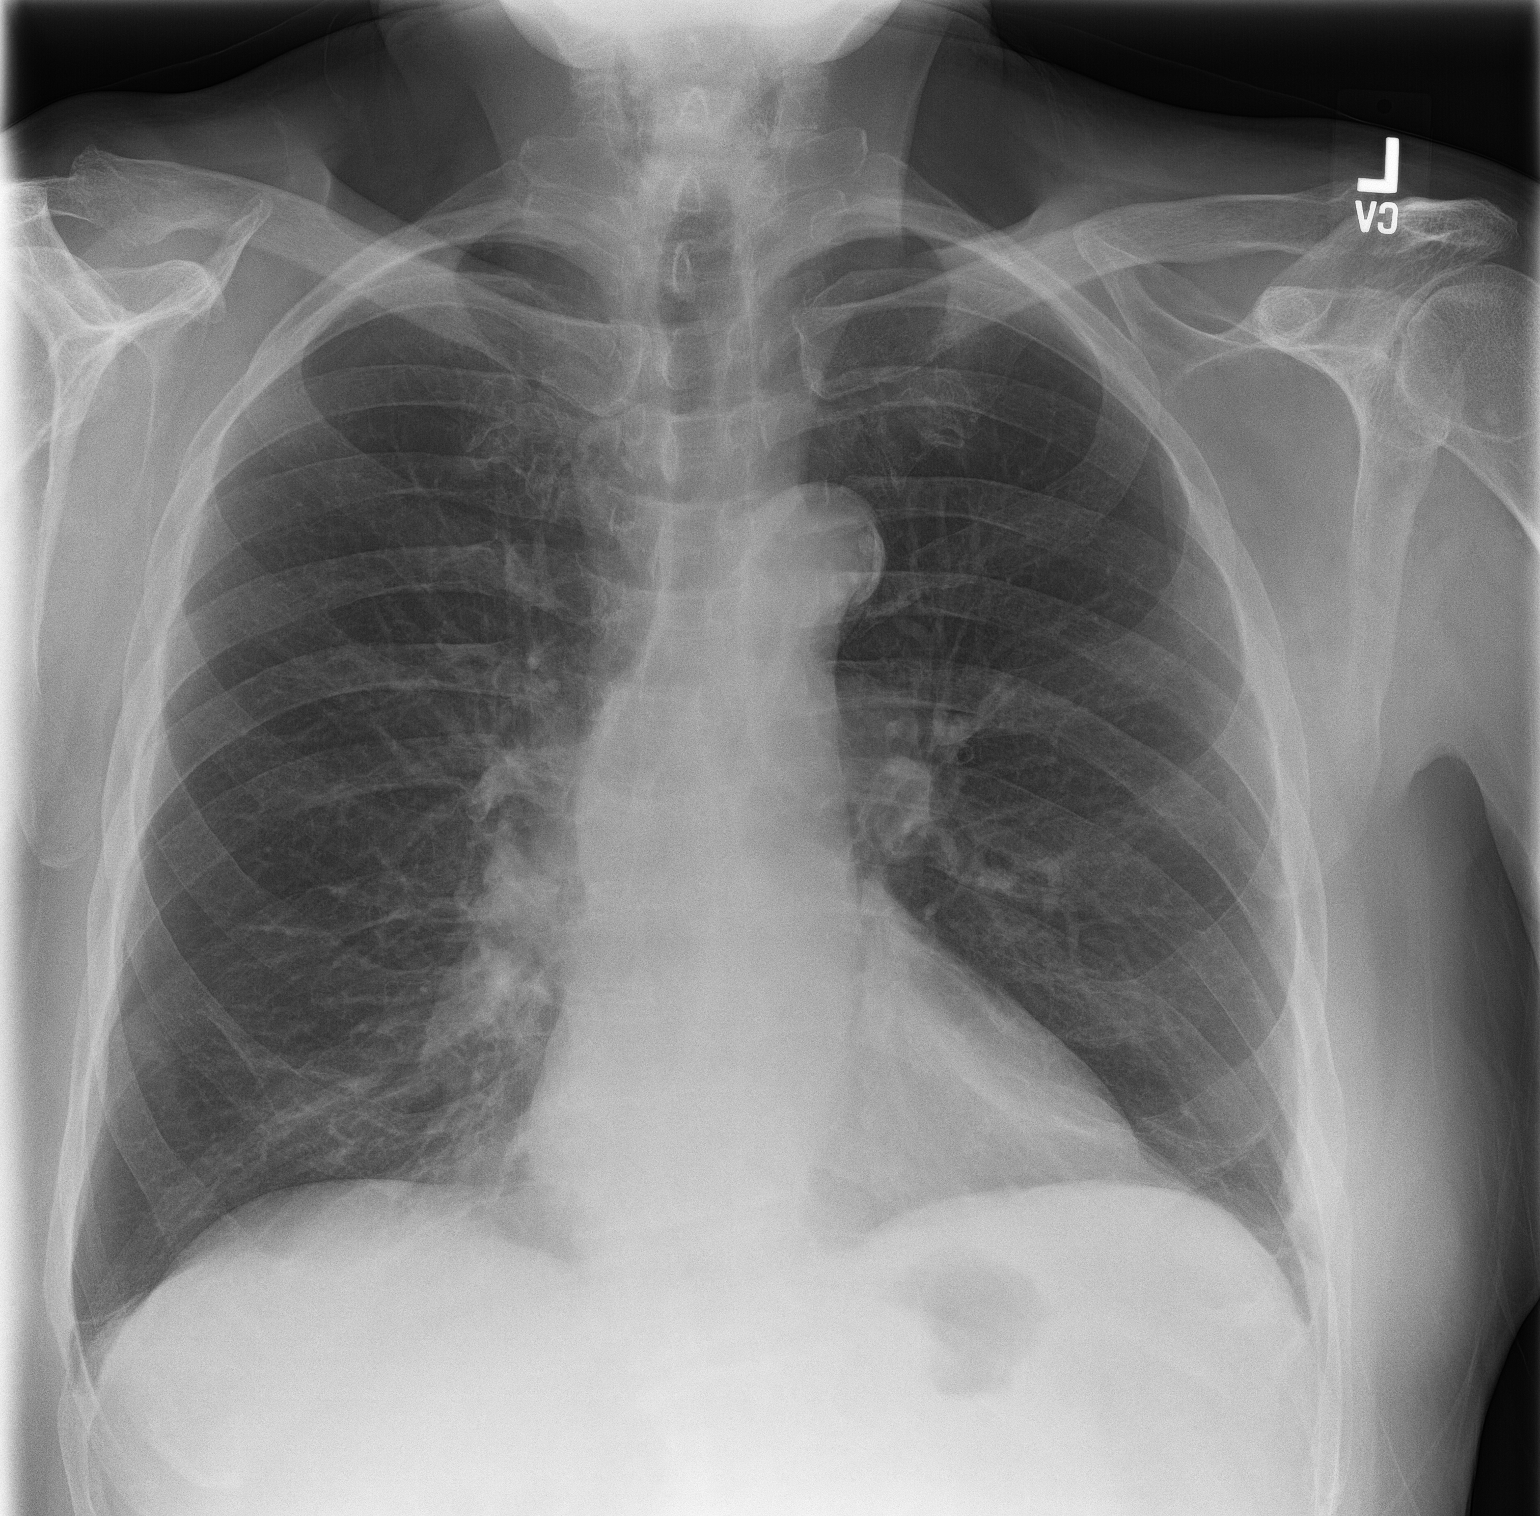
[im 2/2]
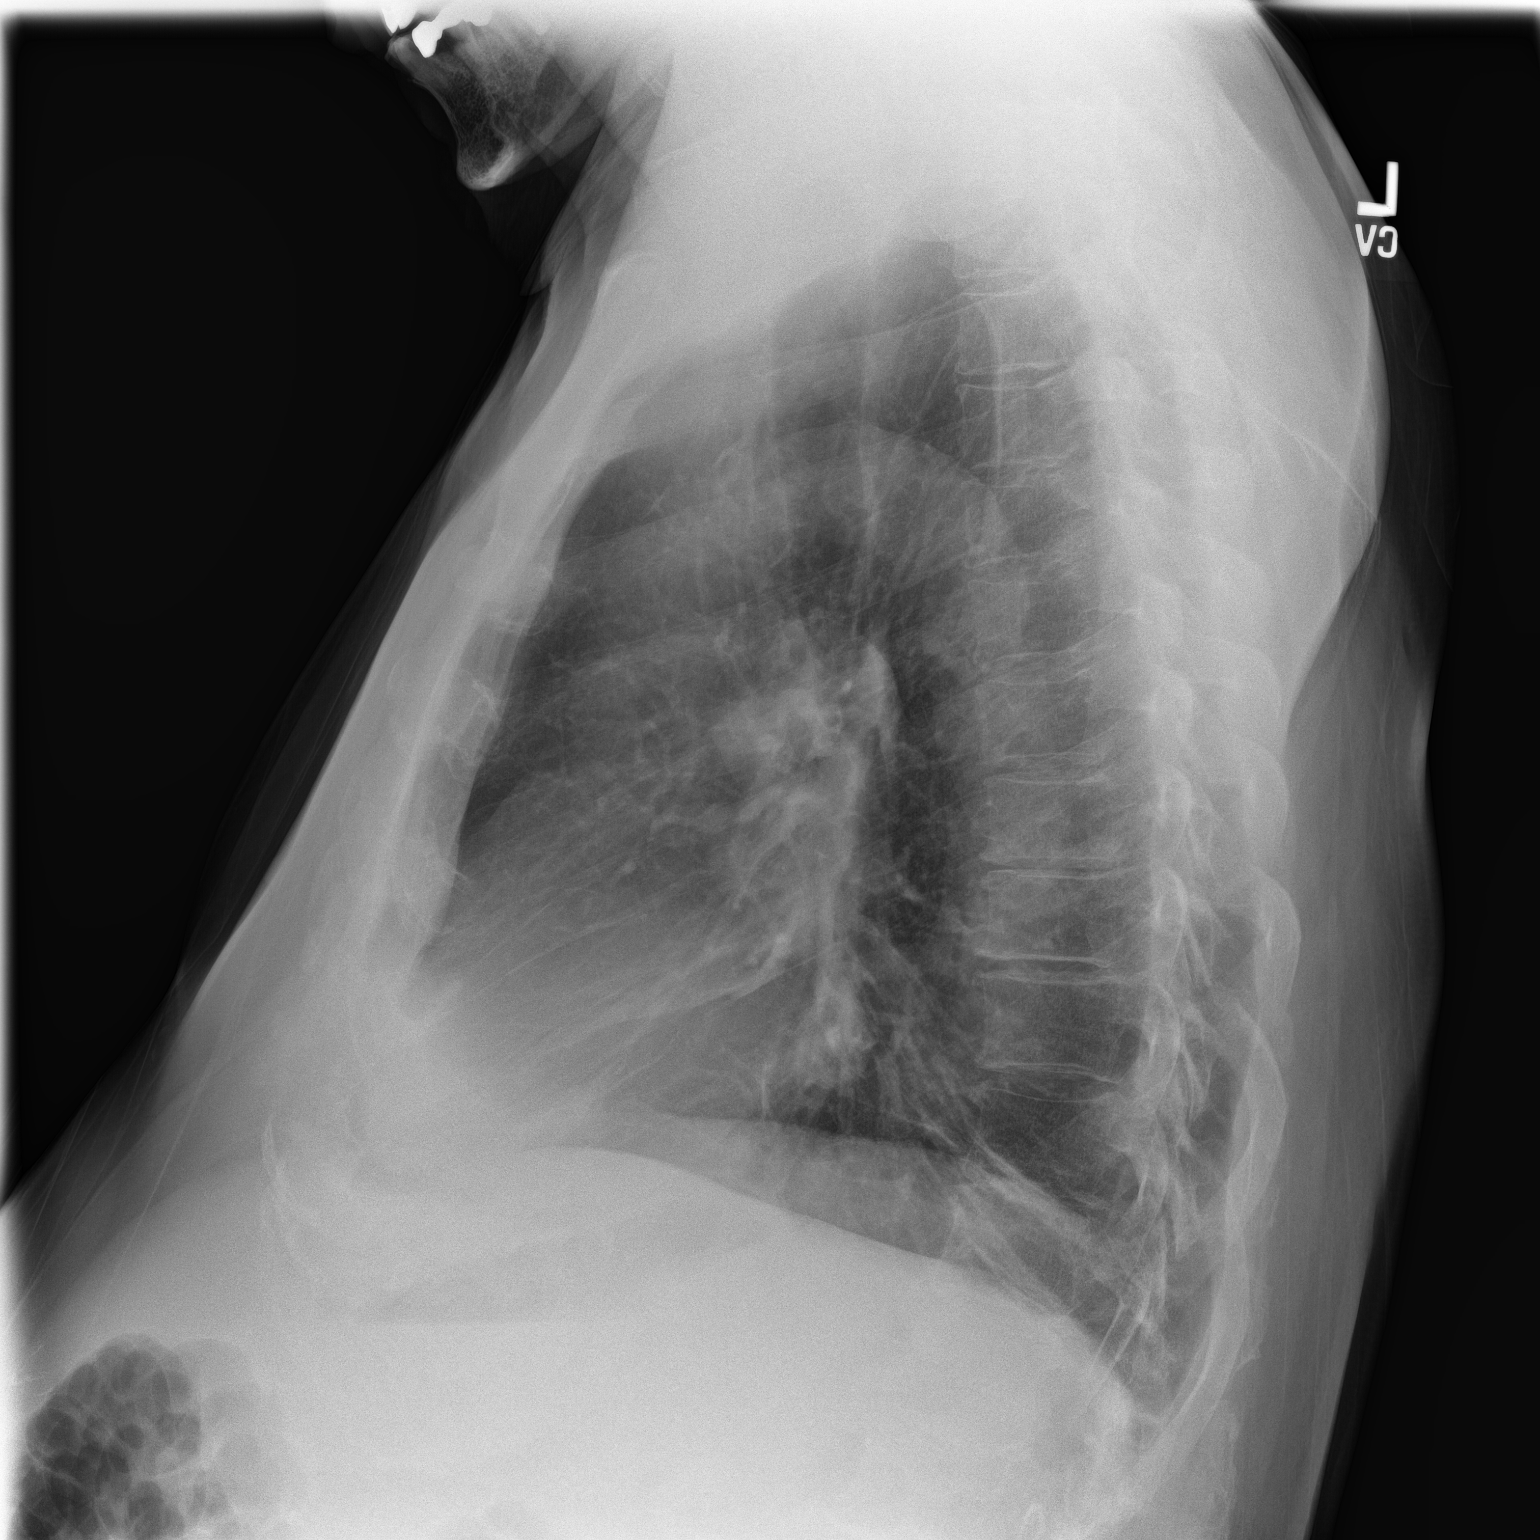

[2 of 2 positions shown; findings below may reference images not displayed]

FINDINGS: The lungs are adequately inflated. There is no focal infiltrate.
There is no pleural effusion. There are stable coarse interstitial
markings in the retrocardiac region bilaterally. The heart and
pulmonary vascularity are normal. The mediastinum is normal in
width. There is mild tortuosity of the descending thoracic aorta.
There is stable mild S-shaped thoracolumbar scoliosis.
IMPRESSION: There is no alveolar pneumonia. Stable coarse retrocardiac lung
markings may reflect bronchiectasis. Superimposed infection cannot
be excluded radiographically.

Follow-up radiographs following anticipated antibiotic therapy are
recommended unless the patient's symptoms completely clear.

## 2016-04-02 DIAGNOSIS — K7460 Unspecified cirrhosis of liver: Secondary | ICD-10-CM | POA: Diagnosis not present

## 2016-04-02 IMAGING — US US LIVER ELASTOGRAPHY
1 series · 13 of 14 positions shown · non-contrast
Comparison: MRI abdomen dated 08/30/2015

CLINICAL DATA: Cirrhosis, ascites

EXAM:
ULTRASOUND HEPATIC ELASTOGRAPHY
TECHNIQUE: Ultrasound elastography evaluation of the liver was performed. A
region of interest was placed in the right lobe of the liver.
Following application of a compressive sonographic pulse, shear
waves were detected in the adjacent hepatic tissue and the shear
wave velocity was calculated. Multiple assessments were performed at
the selected site. Median shear wave velocity is correlated to a
Metavir fibrosis score.

[Series 1: us liver elastography · 0.25mm/px · 13 of 14 slices shown]
[im 1/14]
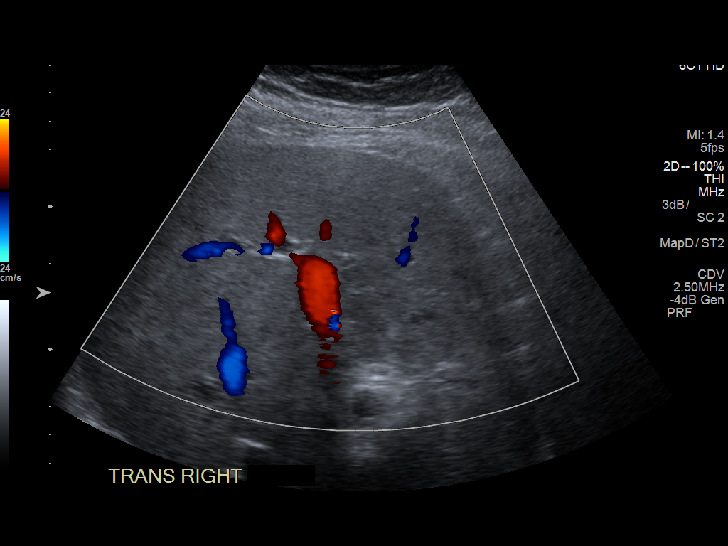
[im 2/14]
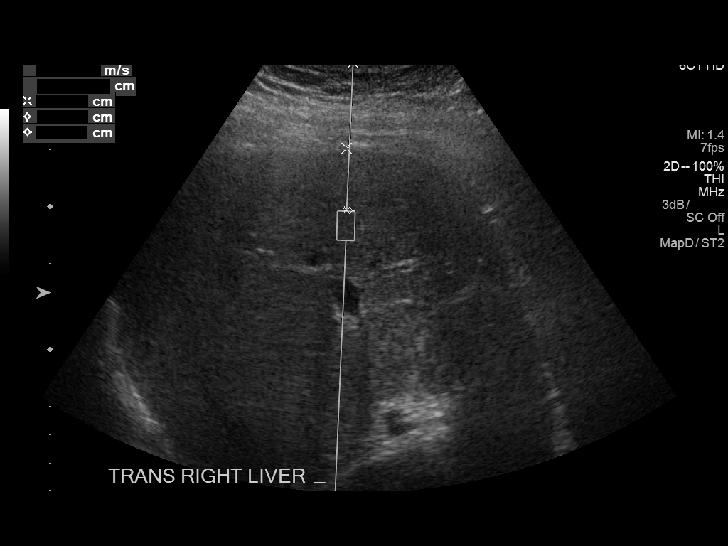
[im 3/14]
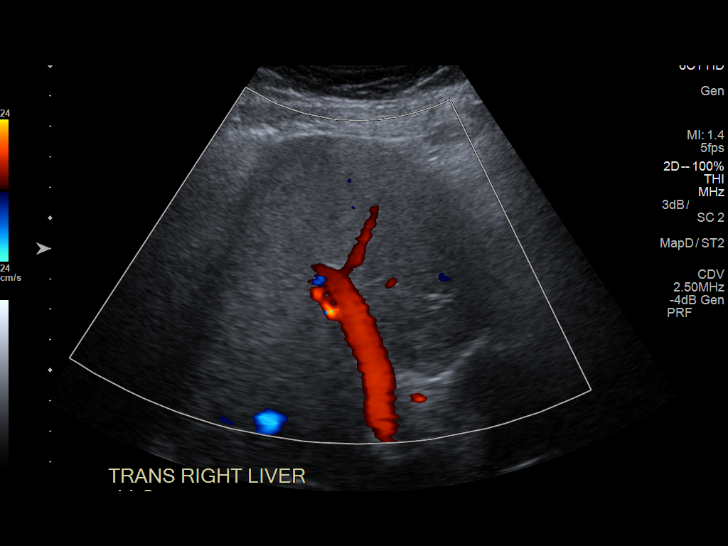
[im 4/14]
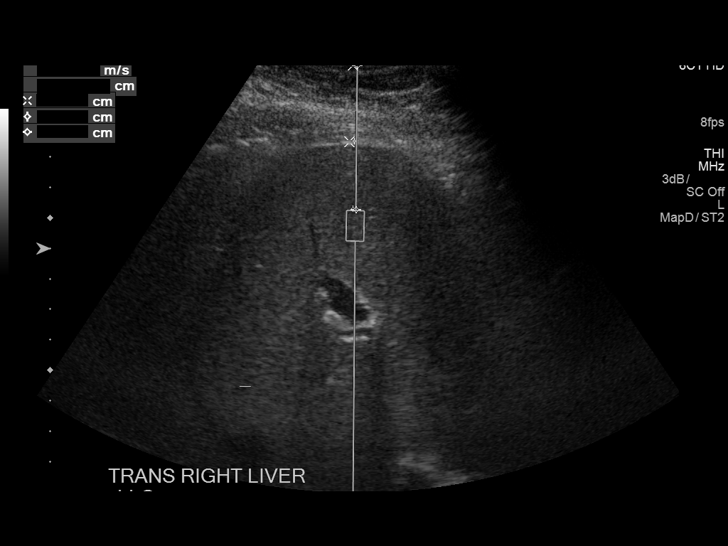
[im 5/14]
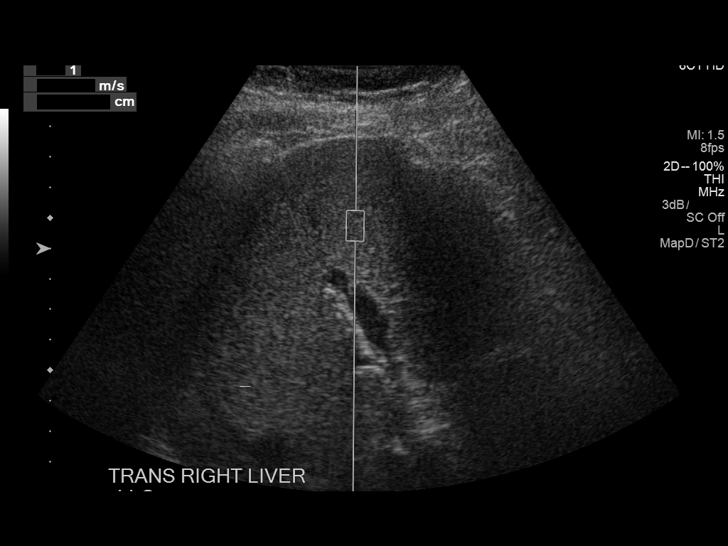
[im 6/14]
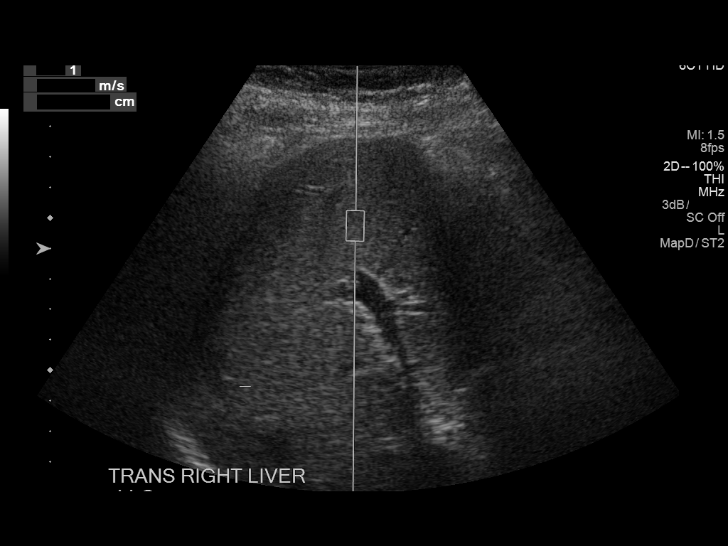
[im 8/14]
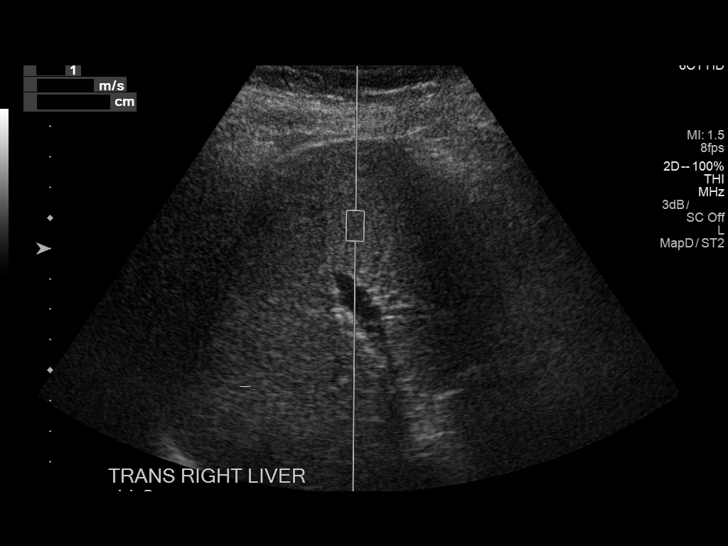
[im 9/14]
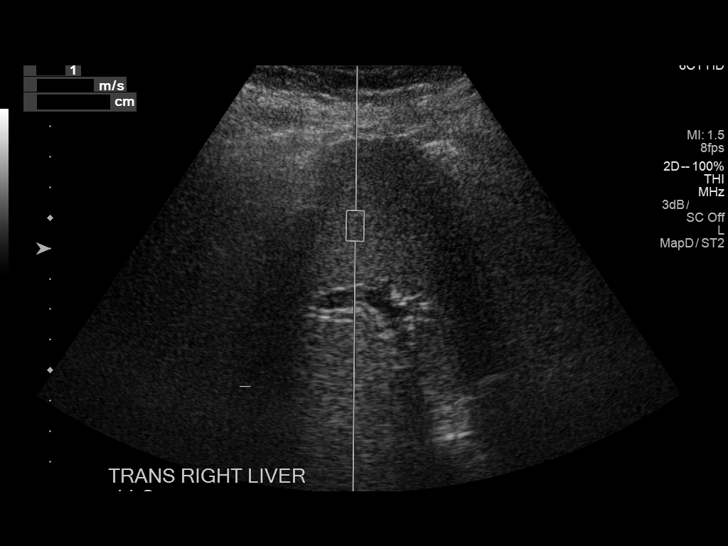
[im 10/14]
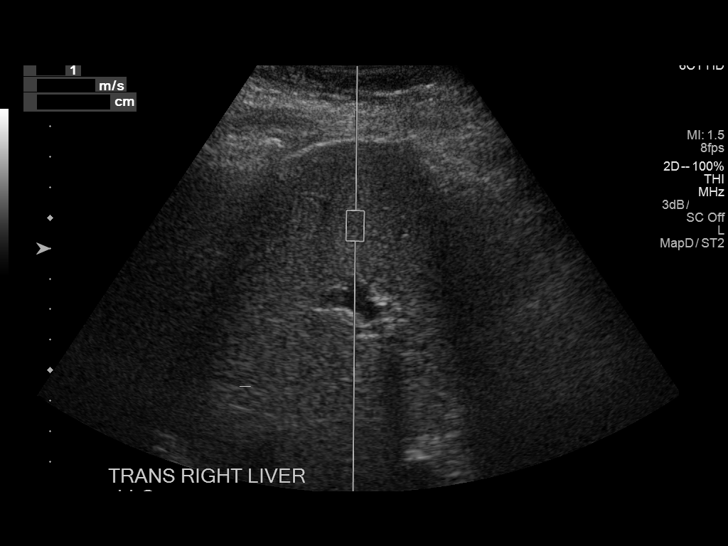
[im 11/14]
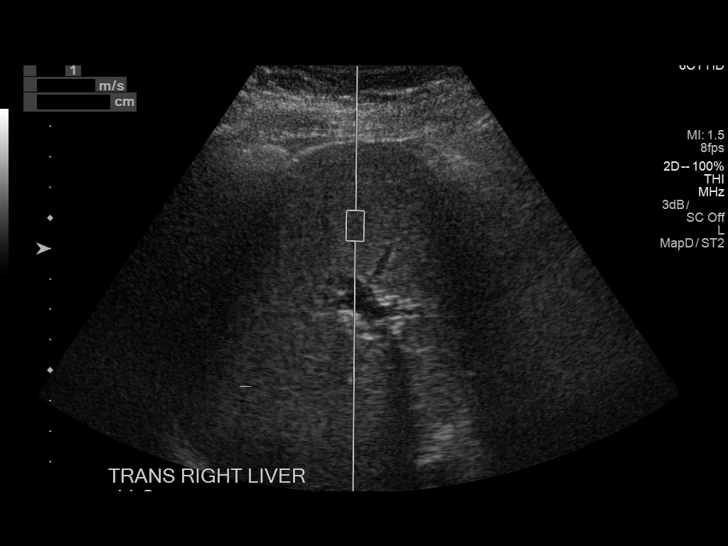
[im 12/14]
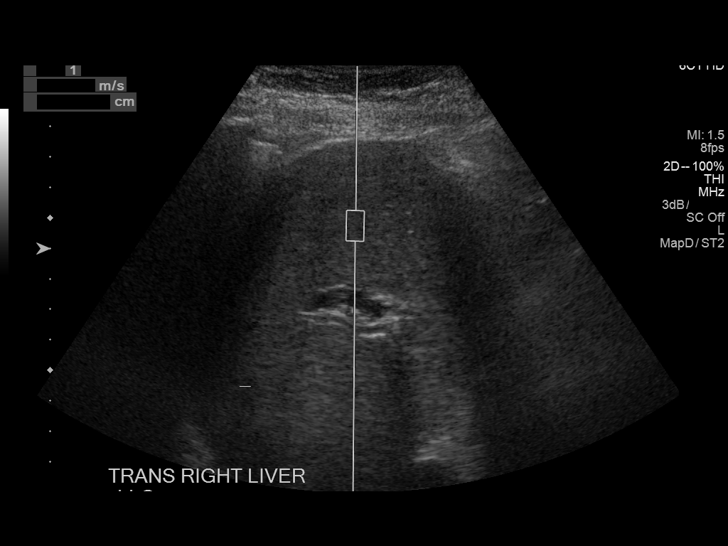
[im 13/14]
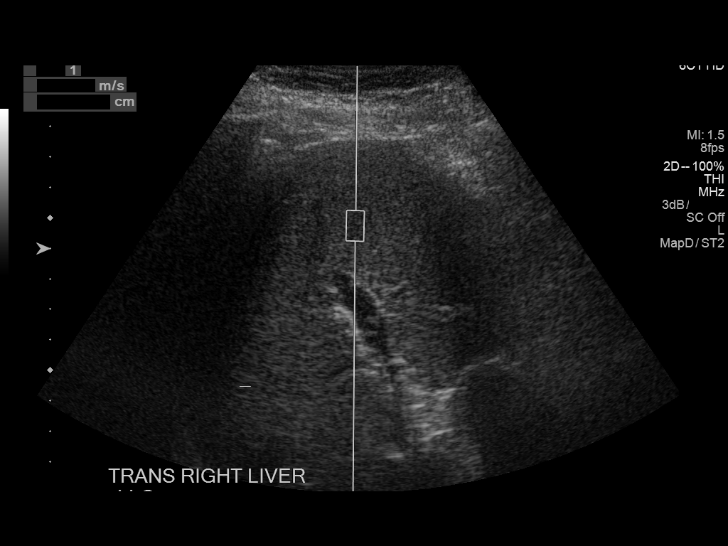
[im 14/14]
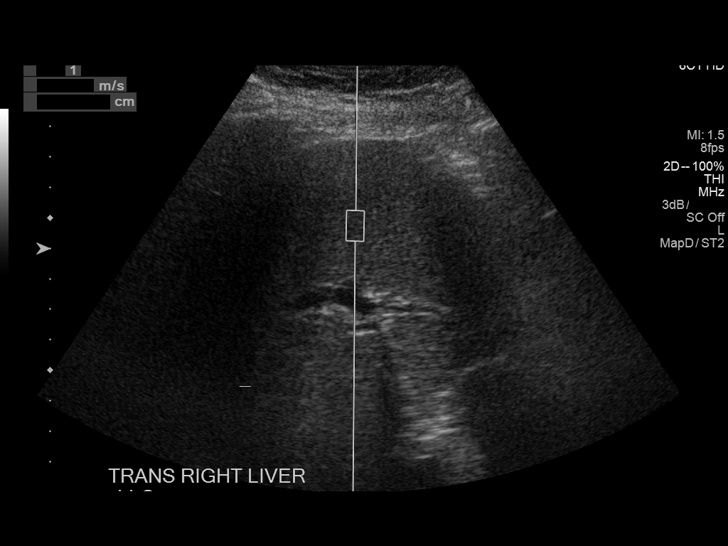

[13 of 14 positions shown; findings below may reference images not displayed]

FINDINGS: Device: Siemens Helix VTQ

Patient position: Oblique

Transducer [id number]

Number of measurements: 10

Hepatic segment:  8

Median velocity:   1.06  m/sec

IQR:

IQR/Median velocity ratio:

Corresponding Metavir fibrosis score:  F0/F1

Risk of fibrosis: Minimal

Limitations of exam: None

Pertinent findings noted on other imaging exams:  None

Please note that abnormal shear wave velocities may also be
identified in clinical settings other than with hepatic fibrosis,
such as: acute hepatitis, elevated right heart and central venous
pressures including use of beta blockers, Andriey disease
([place]), infiltrative processes such as
mastocytosis/amyloidosis/infiltrative tumor, extrahepatic
cholestasis, in the post-prandial state, and liver transplantation.
Correlation with patient history, laboratory data, and clinical
condition recommended.
IMPRESSION: Median hepatic shear wave velocity is calculated at 1.06 m/sec.

Corresponding Metavir fibrosis score is  F0/F1.

Risk of fibrosis is Minimal.

Follow-up: None required

## 2016-04-03 ENCOUNTER — Other Ambulatory Visit: Payer: Self-pay | Admitting: Family Medicine

## 2016-04-29 DIAGNOSIS — B351 Tinea unguium: Secondary | ICD-10-CM | POA: Diagnosis not present

## 2016-04-29 DIAGNOSIS — L851 Acquired keratosis [keratoderma] palmaris et plantaris: Secondary | ICD-10-CM | POA: Diagnosis not present

## 2016-04-29 DIAGNOSIS — E1142 Type 2 diabetes mellitus with diabetic polyneuropathy: Secondary | ICD-10-CM | POA: Diagnosis not present

## 2016-05-16 ENCOUNTER — Other Ambulatory Visit: Payer: Self-pay | Admitting: Family Medicine

## 2016-05-16 DIAGNOSIS — I10 Essential (primary) hypertension: Secondary | ICD-10-CM

## 2016-06-04 ENCOUNTER — Ambulatory Visit: Payer: Medicare Other | Admitting: Family Medicine

## 2016-06-10 DIAGNOSIS — H02834 Dermatochalasis of left upper eyelid: Secondary | ICD-10-CM | POA: Diagnosis not present

## 2016-06-10 DIAGNOSIS — Z885 Allergy status to narcotic agent status: Secondary | ICD-10-CM | POA: Diagnosis not present

## 2016-06-10 DIAGNOSIS — Z961 Presence of intraocular lens: Secondary | ICD-10-CM | POA: Diagnosis not present

## 2016-06-10 DIAGNOSIS — Z880 Allergy status to penicillin: Secondary | ICD-10-CM | POA: Diagnosis not present

## 2016-06-10 DIAGNOSIS — E119 Type 2 diabetes mellitus without complications: Secondary | ICD-10-CM | POA: Diagnosis not present

## 2016-06-10 DIAGNOSIS — Z8669 Personal history of other diseases of the nervous system and sense organs: Secondary | ICD-10-CM | POA: Diagnosis not present

## 2016-06-10 DIAGNOSIS — H2702 Aphakia, left eye: Secondary | ICD-10-CM | POA: Diagnosis not present

## 2016-06-10 DIAGNOSIS — H4052X3 Glaucoma secondary to other eye disorders, left eye, severe stage: Secondary | ICD-10-CM | POA: Diagnosis not present

## 2016-06-10 DIAGNOSIS — Z794 Long term (current) use of insulin: Secondary | ICD-10-CM | POA: Diagnosis not present

## 2016-06-10 DIAGNOSIS — Z888 Allergy status to other drugs, medicaments and biological substances status: Secondary | ICD-10-CM | POA: Diagnosis not present

## 2016-06-10 DIAGNOSIS — H02831 Dermatochalasis of right upper eyelid: Secondary | ICD-10-CM | POA: Diagnosis not present

## 2016-06-10 DIAGNOSIS — H4423 Degenerative myopia, bilateral: Secondary | ICD-10-CM | POA: Diagnosis not present

## 2016-06-12 ENCOUNTER — Other Ambulatory Visit: Payer: Self-pay | Admitting: Family Medicine

## 2016-06-12 DIAGNOSIS — E119 Type 2 diabetes mellitus without complications: Secondary | ICD-10-CM

## 2016-06-12 DIAGNOSIS — Z794 Long term (current) use of insulin: Principal | ICD-10-CM

## 2016-06-17 DIAGNOSIS — Z961 Presence of intraocular lens: Secondary | ICD-10-CM | POA: Diagnosis not present

## 2016-06-17 DIAGNOSIS — H4052X3 Glaucoma secondary to other eye disorders, left eye, severe stage: Secondary | ICD-10-CM | POA: Diagnosis not present

## 2016-06-18 ENCOUNTER — Telehealth: Payer: Self-pay | Admitting: Emergency Medicine

## 2016-06-18 ENCOUNTER — Encounter: Payer: Self-pay | Admitting: Family Medicine

## 2016-06-18 ENCOUNTER — Other Ambulatory Visit: Payer: Self-pay | Admitting: Family Medicine

## 2016-06-18 DIAGNOSIS — W5501XA Bitten by cat, initial encounter: Principal | ICD-10-CM

## 2016-06-18 DIAGNOSIS — S61258A Open bite of other finger without damage to nail, initial encounter: Secondary | ICD-10-CM | POA: Insufficient documentation

## 2016-06-18 MED ORDER — MOXIFLOXACIN HCL 400 MG PO TABS: 400.0000 mg | ORAL_TABLET | Freq: Every day | ORAL | 0 refills | Status: DC

## 2016-06-18 NOTE — Telephone Encounter (Signed)
Prescription for moxifloxacin has been sent to patient's pharmacy

## 2016-06-18 NOTE — Telephone Encounter (Signed)
Patient called and stated he was examining a sick cat  And was bit. Can a antibiotic be called into [facility]

## 2016-06-22 ENCOUNTER — Other Ambulatory Visit: Payer: Self-pay | Admitting: Family Medicine

## 2016-06-22 DIAGNOSIS — E785 Hyperlipidemia, unspecified: Secondary | ICD-10-CM

## 2016-06-22 DIAGNOSIS — Z79899 Other long term (current) drug therapy: Secondary | ICD-10-CM | POA: Diagnosis not present

## 2016-06-22 DIAGNOSIS — E1165 Type 2 diabetes mellitus with hyperglycemia: Secondary | ICD-10-CM | POA: Diagnosis not present

## 2016-06-23 ENCOUNTER — Encounter: Payer: Self-pay | Admitting: Family Medicine

## 2016-06-23 ENCOUNTER — Ambulatory Visit (INDEPENDENT_AMBULATORY_CARE_PROVIDER_SITE_OTHER): Payer: Medicare Other | Admitting: Family Medicine

## 2016-06-23 VITALS — BP 122/71 | HR 61 | Temp 97.5°F | Resp 15 | Ht 76.0 in | Wt 230.8 lb

## 2016-06-23 DIAGNOSIS — R600 Localized edema: Secondary | ICD-10-CM

## 2016-06-23 DIAGNOSIS — E785 Hyperlipidemia, unspecified: Secondary | ICD-10-CM | POA: Diagnosis not present

## 2016-06-23 DIAGNOSIS — I10 Essential (primary) hypertension: Secondary | ICD-10-CM | POA: Diagnosis not present

## 2016-06-23 MED ORDER — ROSUVASTATIN CALCIUM 10 MG PO TABS: 10.0000 mg | ORAL_TABLET | Freq: Every day | ORAL | 0 refills | Status: DC

## 2016-06-23 MED ORDER — HYDROCHLOROTHIAZIDE 25 MG PO TABS: 25.0000 mg | ORAL_TABLET | Freq: Every day | ORAL | 0 refills | Status: DC

## 2016-06-23 MED ORDER — BISOPROLOL-HYDROCHLOROTHIAZIDE 10-6.25 MG PO TABS: 1.0000 | ORAL_TABLET | Freq: Every day | ORAL | 0 refills | Status: DC

## 2016-06-23 NOTE — Progress Notes (Signed)
Name: Jeremy [name]   MRN: [id number]    DOB: Aug 02, 1935   Date:06/23/2016       Progress Note  Subjective  Chief Complaint  Chief Complaint  Patient presents with  . Follow-up    6 mo    Hypertension  This is a chronic problem. The problem is unchanged. The problem is controlled. Associated symptoms include peripheral edema (taking HCTZ 25 mg daily prescribed by Neurologist, edema is improving.). Pertinent negatives include no palpitations or shortness of breath. Past treatments include beta blockers and diuretics. There is no history of kidney disease, CAD/MI or CVA.  Hyperlipidemia  This is a chronic problem. The problem is controlled. Pertinent negatives include no leg pain, myalgias or shortness of breath. Current antihyperlipidemic treatment includes statins.      Past Medical History:  Diagnosis Date  . Diabetes mellitus without complication ([place])   . Diabetic neuropathy ([place])   . Diverticula, colon 1980's  . Diverticulosis   . Glaucoma   . Hemorrhoids   . High cholesterol   . History of hiatal hernia   . Hyperlipidemia   . Hypertension   . Onychomycosis   . Small bowel obstruction 1990's  . Urination disorder     Past Surgical History:  Procedure Laterality Date  . APPENDECTOMY    . BLADDER DIVERTICULECTOMY  2013  . COLON SURGERY Left 1980's  . COLONOSCOPY W/ POLYPECTOMY  1990's   Dr Sharlet Salina  . COLONOSCOPY WITH PROPOFOL N/A 12/21/2014   Procedure: COLONOSCOPY WITH PROPOFOL;  Surgeon: Lollie Sails, MD;  Location[facility] ENDOSCOPY;  Service: Endoscopy;  Laterality: N/A;  . ESOPHAGOGASTRODUODENOSCOPY (EGD) WITH PROPOFOL N/A 02/15/2015   Procedure: ESOPHAGOGASTRODUODENOSCOPY (EGD) WITH PROPOFOL;  Surgeon: Lollie Sails, MD;  Location: [facility] ENDOSCOPY;  Service: Endoscopy;  Laterality: N/A;  . EYE SURGERY    . GLAUCOMA SURGERY Left [place]     [facility], Dr Sharlet Salina  . INGUINAL HERNIA  REPAIR Bilateral 1960's  . LARYNX SURGERY  1990   3 times  . RETINAL DETACHMENT SURGERY Right   . TONSILLECTOMY    . URETER SURGERY  2013   Dr Jacqlyn Larsen    Family History  Problem Relation Age of Onset  . Diabetes Mother   . Cataracts Mother   . Cataracts Father   . Glaucoma Father   . Cancer Sister     gallbladder    Social History   Social History  . Marital status: Married    Spouse name: N/A  . Number of children: N/A  . Years of education: N/A   Occupational History  . Not on file.   Social History Main Topics  . Smoking status: Never Smoker  . Smokeless tobacco: Never Used  . Alcohol use No  . Drug use: No  . Sexual activity: No   Other Topics Concern  . Not on file   Social History Narrative  . No narrative on file     Current Outpatient Prescriptions:  .  Alcaftadine (LASTACAFT) 0.25 % SOLN, Apply to eye., Disp: , Rfl:  .  bisoprolol-hydrochlorothiazide (ZIAC) 10-6.25 MG tablet, Take 1 tablet by mouth daily., Disp: 90 tablet, Rfl: 0 .  brimonidine (ALPHAGAN P) 0.1 % SOLN, , Disp: , Rfl:  .  BYDUREON 2 MG PEN, INJECT 2 MG SUBCUTANEOUSLY ONCE A WEEK., Disp: 4 each, Rfl: 2 .  dorzolamide-timolol (COSOPT) 22.3-6.8 MG/ML ophthalmic solution, 1 drop., Disp: ,  Rfl:  .  esomeprazole (NEXIUM) 20 MG capsule, Take 20 mg by mouth daily before breakfast., Disp: , Rfl:  .  finasteride (PROSCAR) 5 MG tablet, Take 5 mg by mouth daily., Disp: , Rfl:  .  hydrochlorothiazide (HYDRODIURIL) 25 MG tablet, Take 1 tablet (25 mg total) by mouth daily., Disp: 90 tablet, Rfl: 3 .  INVOKANA 300 MG TABS tablet, TAKE ONE TABLET BY MOUTH ONCE DAILY, Disp: 90 tablet, Rfl: 0 .  ketotifen (ZADITOR) 0.025 % ophthalmic solution, 1 drop., Disp: , Rfl:  .  meloxicam (MOBIC) 15 MG tablet, Take 15 mg by mouth daily as needed., Disp: , Rfl:  .  metFORMIN (GLUCOPHAGE) 1000 MG tablet, Take 1 tablet (1,000 mg total) by mouth 2 (two) times daily., Disp: 180 tablet, Rfl: 1 .  moxifloxacin (AVELOX) 400  MG tablet, Take 1 tablet (400 mg total) by mouth daily at 8 pm., Disp: 10 tablet, Rfl: 0 .  olopatadine (PATANOL) 0.1 % ophthalmic solution, PATANOL, 0.1% (Ophthalmic Solution)  INSTILL 1 DROP INTO BOTH EYES TWICE DAILY AS NEEDED AT 6-8 HOUR INTERVALS. for 30 days  Quantity: 5.00;  Refills: 11   Ordered :22-Jul-2010  Ashok Norris MD;  Started 20-Sep-2008 Active Comments: Medication taken as needed. , Disp: , Rfl:  .  ranitidine (ZANTAC) 150 MG tablet, Take 150 mg by mouth daily as needed., Disp: , Rfl:  .  rosuvastatin (CRESTOR) 10 MG tablet, TAKE ONE TABLET BY MOUTH AT BEDTIME, Disp: 90 tablet, Rfl: 0 .  tamsulosin (FLOMAX) 0.4 MG CAPS, Take 0.4 mg by mouth 2 (two) times daily., Disp: , Rfl:  .  timolol (TIMOPTIC-XR) 0.25 % ophthalmic gel-forming, , Disp: , Rfl:  .  valsartan (DIOVAN) 160 MG tablet, TAKE ONE TABLET BY MOUTH ONCE DAILY, Disp: 90 tablet, Rfl: 0  Allergies  Allergen Reactions  . Demerol [Meperidine] Other (See Comments) and Anaphylaxis    hypotension  . Actos [Pioglitazone] Other (See Comments) and Hives  . Dilaudid [Hydromorphone Hcl]   . Hydromorphone     Other reaction(s): Unknown MAKES BLOOD PRESSURE BOTTOM OUT  . Dapagliflozin Rash  . Penicillins Rash    Review of Systems  Respiratory: Negative for shortness of breath.   Cardiovascular: Negative for palpitations.  Musculoskeletal: Negative for myalgias.      Objective  Vitals:   06/23/16 1330  BP: 122/71  Pulse: 61  Resp: 15  Temp: 97.5 F (36.4 C)  TempSrc: Oral  SpO2: 96%  Weight: 230 lb 12.8 oz (104.7 kg)  Height: 6\' 4"  (1.93 m)    Physical Exam  Constitutional: He is oriented to person, place, and time and well-developed, well-nourished, and in no distress.  HENT:  Head: Normocephalic and atraumatic.  Cardiovascular: Normal rate, regular rhythm and normal heart sounds.   No murmur heard. Pulmonary/Chest: Effort normal and breath sounds normal. He has no wheezes.  Abdominal: Soft. Bowel  sounds are normal.  Neurological: He is alert and oriented to person, place, and time.  Psychiatric: Mood, memory, affect and judgment normal.  Nursing note and vitals reviewed.     Assessment & Plan  1. Hyperlipidemia, unspecified hyperlipidemia type - rosuvastatin (CRESTOR) 10 MG tablet; Take 1 tablet (10 mg total) by mouth at bedtime.  Dispense: 90 tablet; Refill: 0 - Lipid Profile  2. Bilateral lower extremity edema  - hydrochlorothiazide (HYDRODIURIL) 25 MG tablet; Take 1 tablet (25 mg total) by mouth daily.  Dispense: 90 tablet; Refill: 0  3. Benign essential HTN  - bisoprolol-hydrochlorothiazide (ZIAC) 10-6.25  MG tablet; Take 1 tablet by mouth daily.  Dispense: 90 tablet; Refill: 0    Sereniti Wan [facility]ad [facility] 06/23/2016 1:39 PM

## 2016-06-24 LAB — LIPID PANEL
CHOLESTEROL: 112 mg/dL (ref <200)
HDL: 43 mg/dL (ref >40)
LDL Cholesterol: 10 mg/dL (ref <100)
Total CHOL/HDL Ratio: 2.6 Ratio (ref <5.0)
Triglycerides: 296 mg/dL — ABNORMAL HIGH (ref <150)
VLDL: 59 mg/dL — ABNORMAL HIGH (ref <30)

## 2016-06-25 ENCOUNTER — Other Ambulatory Visit: Payer: Self-pay | Admitting: Gastroenterology

## 2016-06-25 DIAGNOSIS — D369 Benign neoplasm, unspecified site: Secondary | ICD-10-CM | POA: Diagnosis not present

## 2016-06-25 DIAGNOSIS — K7460 Unspecified cirrhosis of liver: Secondary | ICD-10-CM

## 2016-07-02 ENCOUNTER — Ambulatory Visit: Payer: Self-pay

## 2016-07-03 ENCOUNTER — Ambulatory Visit (INDEPENDENT_AMBULATORY_CARE_PROVIDER_SITE_OTHER): Payer: Medicare Other | Admitting: *Deleted

## 2016-07-03 DIAGNOSIS — L989 Disorder of the skin and subcutaneous tissue, unspecified: Secondary | ICD-10-CM

## 2016-07-03 NOTE — Patient Instructions (Signed)
Return as needed

## 2016-07-03 NOTE — Progress Notes (Signed)
Patient came in today for a wound check.  The wound is clean, with no signs of infection noted.The sutures were removed.  

## 2016-07-15 DIAGNOSIS — I10 Essential (primary) hypertension: Secondary | ICD-10-CM | POA: Diagnosis not present

## 2016-07-15 DIAGNOSIS — R600 Localized edema: Secondary | ICD-10-CM | POA: Diagnosis not present

## 2016-07-17 IMAGING — CT CT HEAD W/O CM
1 series · 16 of 29 positions shown, 20 images · non-contrast
Comparison: 04/02/2014

CLINICAL DATA: Visual disturbances

EXAM:
CT HEAD WITHOUT CONTRAST
TECHNIQUE: Contiguous axial images were obtained from the base of the skull
through the vertex without intravenous contrast.

[Series 2: head wo · axial · 0.43mm/px · z∈[-50,+80]mm · 16 of 29 slices shown, 20 images]
[im 2/29  brain]
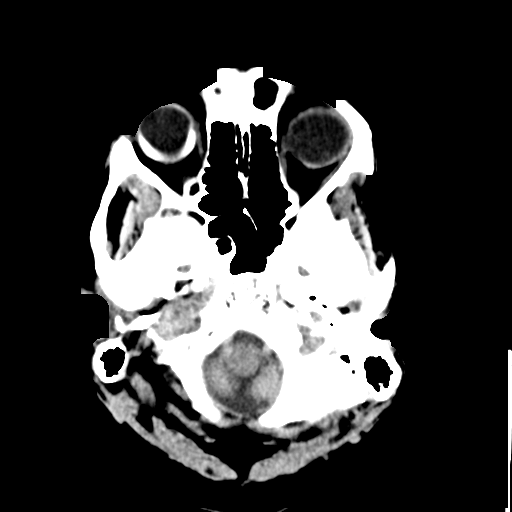
[im 2/29  bone]
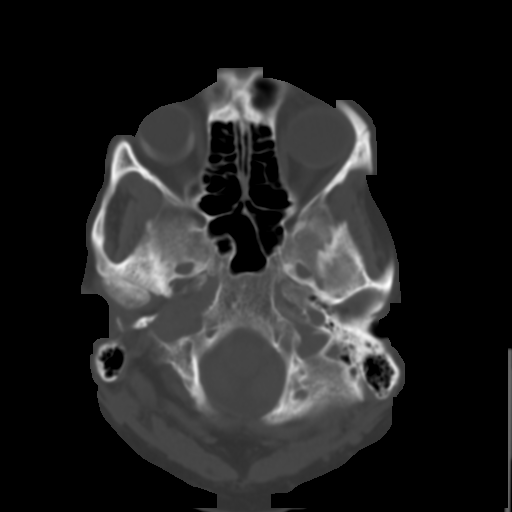
[im 4/29  brain]
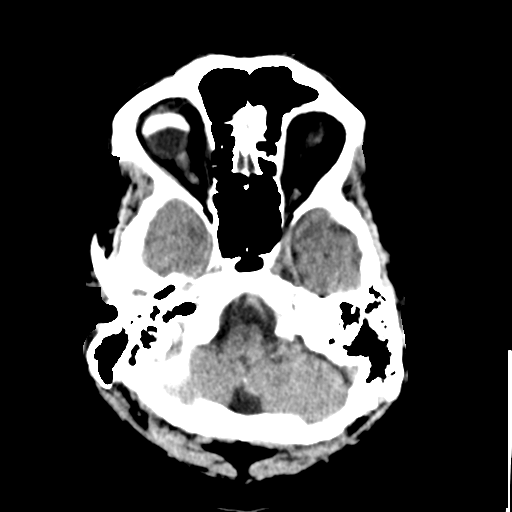
[im 6/29  brain]
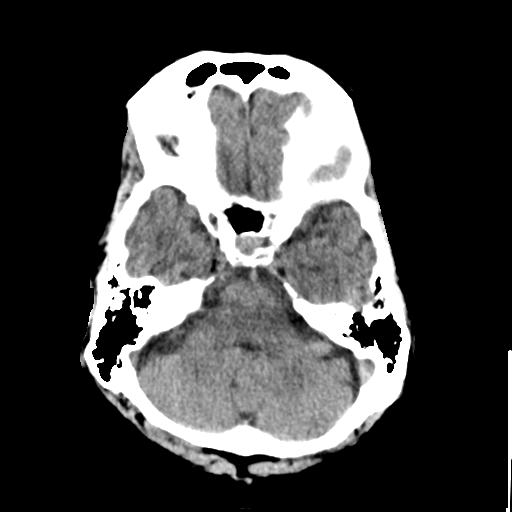
[im 7/29  brain]
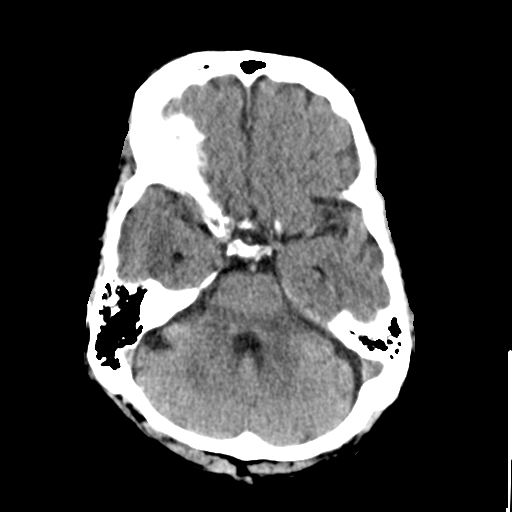
[im 9/29  brain]
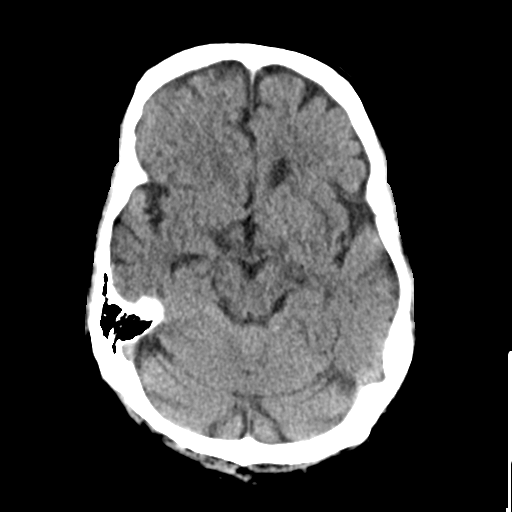
[im 9/29  bone]
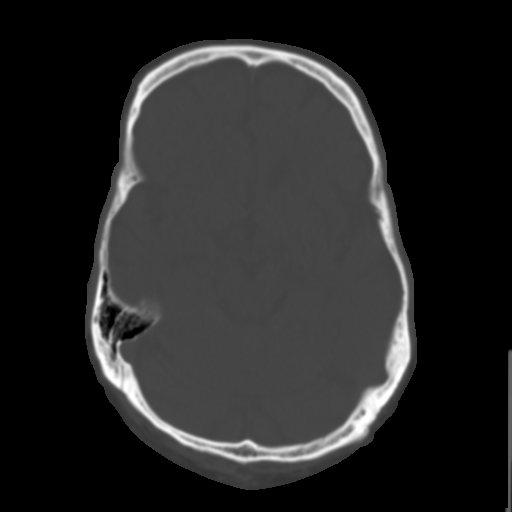
[im 11/29  brain]
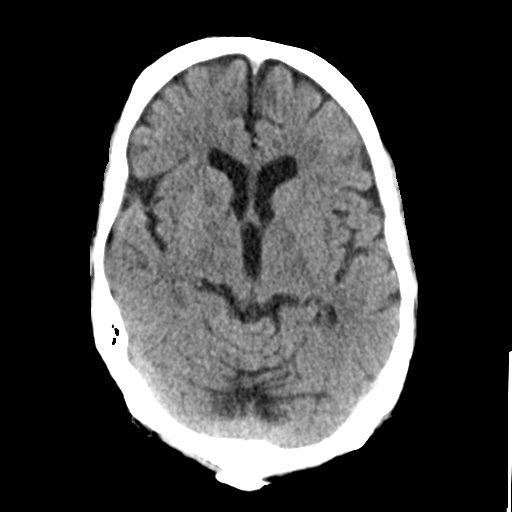
[im 12/29  brain]
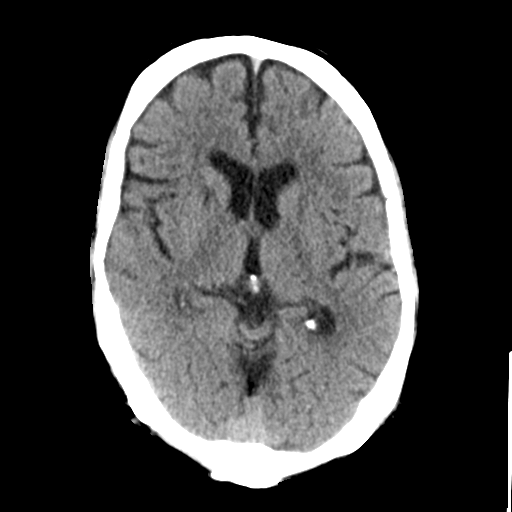
[im 14/29  brain]
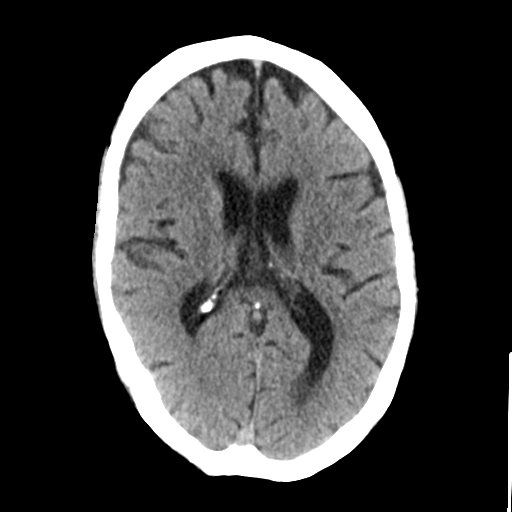
[im 16/29  brain]
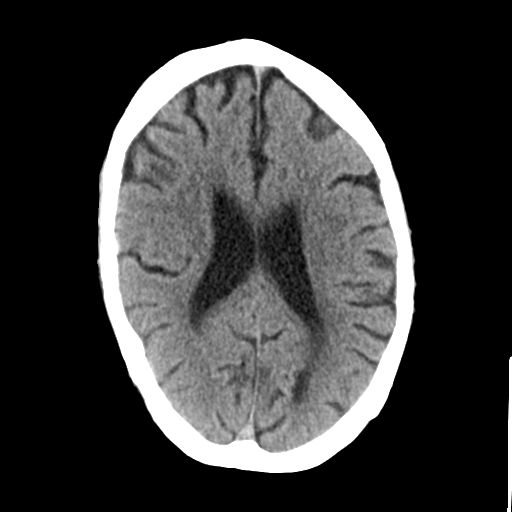
[im 16/29  bone]
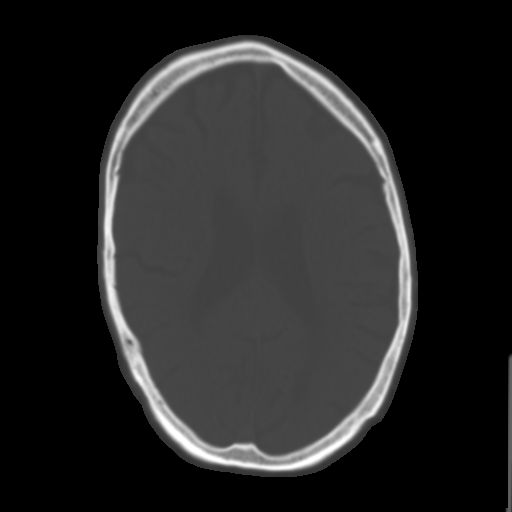
[im 18/29  brain]
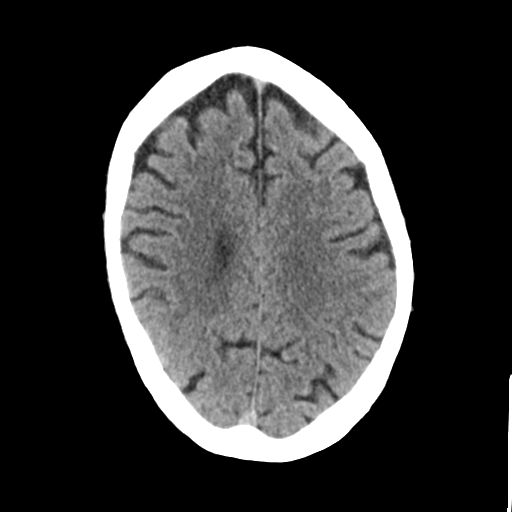
[im 19/29  brain]
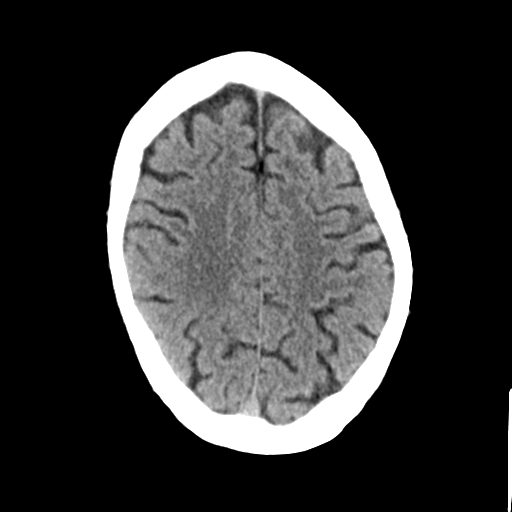
[im 21/29  brain]
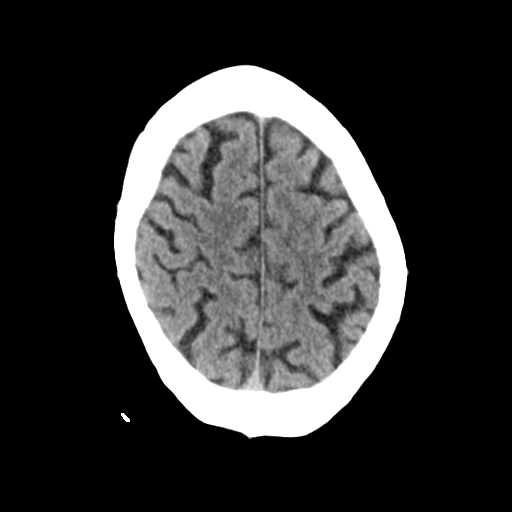
[im 23/29  brain]
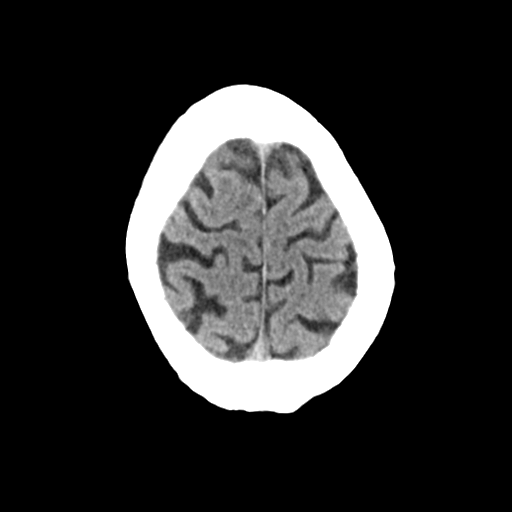
[im 23/29  bone]
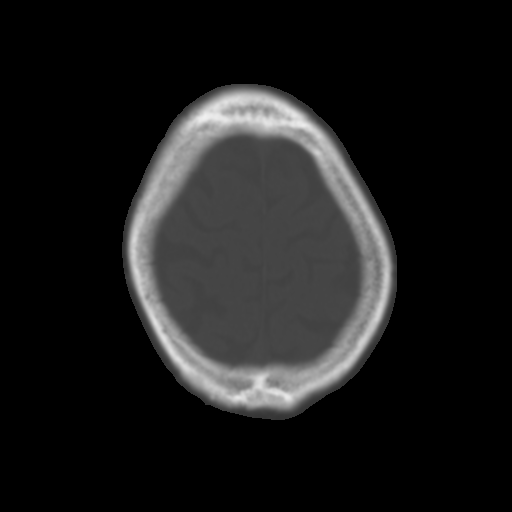
[im 24/29  brain]
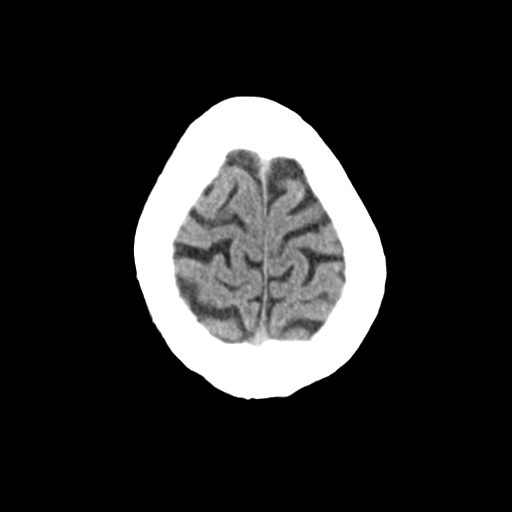
[im 26/29  brain]
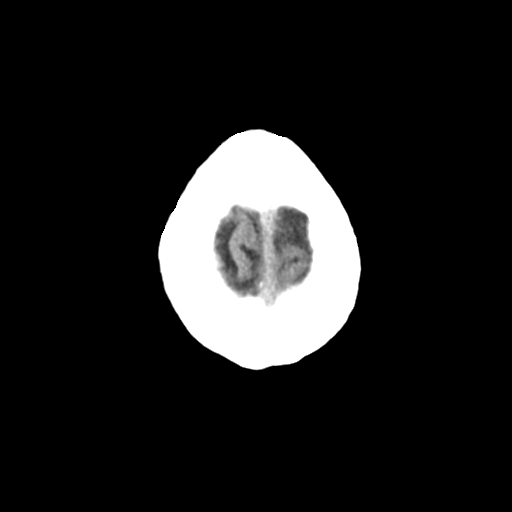
[im 28/29  brain]
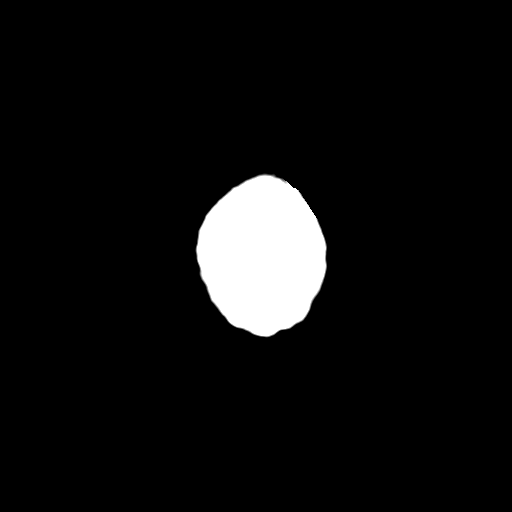

[16 of 29 positions shown; findings below may reference images not displayed]

FINDINGS: Bony calvarium is intact. No gross soft tissue abnormality is noted.
Mild atrophic changes are noted. Small area decreased attenuation is
noted consistent with a lacunar infarct in the region of the
anterior limb of the internal capsule on the right. When compared
with a prior MRI this is stable. No findings to suggest acute
hemorrhage, acute infarction or space-occupying mass lesion are
noted.
IMPRESSION: Old lacunar infarct.  No acute abnormality noted.

## 2016-07-27 DIAGNOSIS — E1165 Type 2 diabetes mellitus with hyperglycemia: Secondary | ICD-10-CM | POA: Diagnosis not present

## 2016-07-27 DIAGNOSIS — R600 Localized edema: Secondary | ICD-10-CM | POA: Diagnosis not present

## 2016-08-03 DIAGNOSIS — B351 Tinea unguium: Secondary | ICD-10-CM | POA: Diagnosis not present

## 2016-08-03 DIAGNOSIS — L851 Acquired keratosis [keratoderma] palmaris et plantaris: Secondary | ICD-10-CM | POA: Diagnosis not present

## 2016-08-03 DIAGNOSIS — E1165 Type 2 diabetes mellitus with hyperglycemia: Secondary | ICD-10-CM | POA: Diagnosis not present

## 2016-08-03 DIAGNOSIS — E1142 Type 2 diabetes mellitus with diabetic polyneuropathy: Secondary | ICD-10-CM | POA: Diagnosis not present

## 2016-08-06 DIAGNOSIS — E1140 Type 2 diabetes mellitus with diabetic neuropathy, unspecified: Secondary | ICD-10-CM | POA: Diagnosis not present

## 2016-08-06 DIAGNOSIS — E1165 Type 2 diabetes mellitus with hyperglycemia: Secondary | ICD-10-CM | POA: Diagnosis not present

## 2016-08-06 DIAGNOSIS — I10 Essential (primary) hypertension: Secondary | ICD-10-CM | POA: Diagnosis not present

## 2016-08-06 DIAGNOSIS — Z8673 Personal history of transient ischemic attack (TIA), and cerebral infarction without residual deficits: Secondary | ICD-10-CM | POA: Diagnosis not present

## 2016-08-06 DIAGNOSIS — E785 Hyperlipidemia, unspecified: Secondary | ICD-10-CM | POA: Diagnosis not present

## 2016-08-12 ENCOUNTER — Other Ambulatory Visit: Payer: Self-pay | Admitting: Family Medicine

## 2016-08-12 DIAGNOSIS — I10 Essential (primary) hypertension: Secondary | ICD-10-CM

## 2016-08-17 ENCOUNTER — Telehealth: Payer: Self-pay | Admitting: Family Medicine

## 2016-08-17 NOTE — Telephone Encounter (Signed)
Called Pt to schedule AWV with [facility] - knb °

## 2016-08-19 ENCOUNTER — Other Ambulatory Visit: Payer: Self-pay | Admitting: Gastroenterology

## 2016-08-19 ENCOUNTER — Ambulatory Visit
Admission: RE | Admit: 2016-08-19 | Discharge: 2016-08-19 | Disposition: A | Discharge status: Home / Self Care | Payer: Medicare Other | Source: Ambulatory Visit | Attending: Gastroenterology | Admitting: Gastroenterology

## 2016-08-19 DIAGNOSIS — K7460 Unspecified cirrhosis of liver: Secondary | ICD-10-CM

## 2016-08-21 ENCOUNTER — Ambulatory Visit
Admission: RE | Admit: 2016-08-21 | Discharge: 2016-08-21 | Disposition: A | Discharge status: Home / Self Care | Payer: Medicare Other | Source: Ambulatory Visit | Attending: Gastroenterology | Admitting: Gastroenterology

## 2016-08-21 DIAGNOSIS — K7460 Unspecified cirrhosis of liver: Secondary | ICD-10-CM | POA: Diagnosis not present

## 2016-08-31 DIAGNOSIS — M25562 Pain in left knee: Secondary | ICD-10-CM | POA: Diagnosis not present

## 2016-09-02 ENCOUNTER — Telehealth: Payer: Self-pay

## 2016-09-02 NOTE — Telephone Encounter (Signed)
Left message for pt to scheudle AWV, ANR

## 2016-09-21 ENCOUNTER — Encounter: Payer: Self-pay | Admitting: Family Medicine

## 2016-09-21 ENCOUNTER — Other Ambulatory Visit: Payer: Self-pay | Admitting: Family Medicine

## 2016-09-21 ENCOUNTER — Ambulatory Visit (INDEPENDENT_AMBULATORY_CARE_PROVIDER_SITE_OTHER): Payer: Medicare Other | Admitting: Family Medicine

## 2016-09-21 DIAGNOSIS — I10 Essential (primary) hypertension: Secondary | ICD-10-CM | POA: Diagnosis not present

## 2016-09-21 DIAGNOSIS — E782 Mixed hyperlipidemia: Secondary | ICD-10-CM

## 2016-09-21 DIAGNOSIS — R600 Localized edema: Secondary | ICD-10-CM

## 2016-09-21 MED ORDER — ROSUVASTATIN CALCIUM 10 MG PO TABS: 10.0000 mg | ORAL_TABLET | Freq: Every day | ORAL | 0 refills | Status: DC

## 2016-09-21 MED ORDER — VALSARTAN 160 MG PO TABS: 160.0000 mg | ORAL_TABLET | Freq: Every day | ORAL | 0 refills | Status: DC

## 2016-09-21 MED ORDER — BISOPROLOL-HYDROCHLOROTHIAZIDE 10-6.25 MG PO TABS: 1.0000 | ORAL_TABLET | Freq: Every day | ORAL | 0 refills | Status: DC

## 2016-09-21 MED ORDER — HYDROCHLOROTHIAZIDE 25 MG PO TABS
25.0000 mg | ORAL_TABLET | Freq: Every day | ORAL | 0 refills | Status: DC
Start: 2016-09-21 — End: 2017-01-04

## 2016-09-21 NOTE — Progress Notes (Signed)
Name: Jeremy [name]   MRN: [id number]    DOB: 09/05/1935   Date:09/21/2016       Progress Note  Subjective  Chief Complaint  Chief Complaint  Patient presents with  . Follow-up    3 mo  . Medication Refill    Hypertension  This is a chronic problem. The problem is unchanged. The problem is controlled. Associated symptoms include peripheral edema. Pertinent negatives include no blurred vision, chest pain, headaches, orthopnea, palpitations or shortness of breath. Past treatments include beta blockers, diuretics and angiotensin blockers. There is no history of kidney disease, CAD/MI or CVA.  Hyperlipidemia  This is a chronic problem. The problem is controlled. Exacerbating diseases include diabetes and obesity. Pertinent negatives include no chest pain, leg pain or shortness of breath. Current antihyperlipidemic treatment includes statins. There are no compliance problems.  Risk factors for coronary artery disease include diabetes mellitus, dyslipidemia and male sex.   Lower Extremity Edema:  Pt. Has bilateral lower extremity edema, rated as 1+, worse in evening, he takes HCTZ 25 mg daily and is gone by the following morning. He has Dr. Saralyn Pilar perform an echocardiogram which was normal.   Past Medical History:  Diagnosis Date  . Diabetes mellitus without complication ([place])   . Diabetic neuropathy ([place])   . Diverticula, colon 1980's  . Diverticulosis   . Glaucoma   . Hemorrhoids   . High cholesterol   . History of hiatal hernia   . Hyperlipidemia   . Hypertension   . Onychomycosis   . Small bowel obstruction 1990's  . Urination disorder     Past Surgical History:  Procedure Laterality Date  . APPENDECTOMY    . BLADDER DIVERTICULECTOMY  2013  . COLON SURGERY Left 1980's  . COLONOSCOPY W/ POLYPECTOMY  1990's   Dr Sharlet Salina  . COLONOSCOPY WITH PROPOFOL N/A 12/21/2014   Procedure: COLONOSCOPY WITH PROPOFOL;  Surgeon: Lollie Sails, MD;  Location: [facility] ENDOSCOPY;  Service:  Endoscopy;  Laterality: N/A;  . ESOPHAGOGASTRODUODENOSCOPY (EGD) WITH PROPOFOL N/A 02/15/2015   Procedure: ESOPHAGOGASTRODUODENOSCOPY (EGD) WITH PROPOFOL;  Surgeon: Lollie Sails, MD;  Location: [facility] ENDOSCOPY;  Service: Endoscopy;  Laterality: N/A;  . EYE SURGERY    . GLAUCOMA SURGERY Left [place]     [place], Dr Sharlet Salina  . INGUINAL HERNIA REPAIR Bilateral 1960's  . LARYNX SURGERY  1990   3 times  . RETINAL DETACHMENT SURGERY Right   . TONSILLECTOMY    . URETER SURGERY  2013   Dr Jacqlyn Larsen    Family History  Problem Relation Age of Onset  . Diabetes Mother   . Cataracts Mother   . Cataracts Father   . Glaucoma Father   . Cancer Sister     gallbladder    Social History   Social History  . Marital status: Married    Spouse name: N/A  . Number of children: N/A  . Years of education: N/A   Occupational History  . Not on file.   Social History Main Topics  . Smoking status: Never Smoker  . Smokeless tobacco: Never Used  . Alcohol use No  . Drug use: No  . Sexual activity: No   Other Topics Concern  . Not on file   Social History Narrative  . No narrative on file     Current Outpatient Prescriptions:  .  Alcaftadine (LASTACAFT) 0.25 % SOLN, Apply to  eye., Disp: , Rfl:  .  bisoprolol-hydrochlorothiazide (ZIAC) 10-6.25 MG tablet, TAKE ONE TABLET BY MOUTH ONCE DAILY, Disp: 90 tablet, Rfl: 0 .  brimonidine (ALPHAGAN P) 0.1 % SOLN, , Disp: , Rfl:  .  BYDUREON 2 MG PEN, INJECT 2 MG SUBCUTANEOUSLY ONCE A WEEK., Disp: 4 each, Rfl: 2 .  dorzolamide-timolol (COSOPT) 22.3-6.8 MG/ML ophthalmic solution, 1 drop., Disp: , Rfl:  .  esomeprazole (NEXIUM) 20 MG capsule, Take 20 mg by mouth daily before breakfast., Disp: , Rfl:  .  finasteride (PROSCAR) 5 MG tablet, Take 5 mg by mouth daily., Disp: , Rfl:  .  hydrochlorothiazide (HYDRODIURIL) 25 MG tablet, Take 1 tablet (25 mg total) by mouth daily., Disp: 90  tablet, Rfl: 0 .  INVOKANA 300 MG TABS tablet, TAKE ONE TABLET BY MOUTH ONCE DAILY, Disp: 90 tablet, Rfl: 0 .  ketotifen (ZADITOR) 0.025 % ophthalmic solution, 1 drop., Disp: , Rfl:  .  meloxicam (MOBIC) 15 MG tablet, Take 15 mg by mouth daily as needed., Disp: , Rfl:  .  metFORMIN (GLUCOPHAGE) 1000 MG tablet, Take 1 tablet (1,000 mg total) by mouth 2 (two) times daily., Disp: 180 tablet, Rfl: 1 .  moxifloxacin (AVELOX) 400 MG tablet, Take 1 tablet (400 mg total) by mouth daily at 8 pm., Disp: 10 tablet, Rfl: 0 .  olopatadine (PATANOL) 0.1 % ophthalmic solution, PATANOL, 0.1% (Ophthalmic Solution)  INSTILL 1 DROP INTO BOTH EYES TWICE DAILY AS NEEDED AT 6-8 HOUR INTERVALS. for 30 days  Quantity: 5.00;  Refills: 11   Ordered :22-Jul-2010  Ashok Norris MD;  Started 20-Sep-2008 Active Comments: Medication taken as needed. , Disp: , Rfl:  .  ranitidine (ZANTAC) 150 MG tablet, Take 150 mg by mouth daily as needed., Disp: , Rfl:  .  rosuvastatin (CRESTOR) 10 MG tablet, Take 1 tablet (10 mg total) by mouth at bedtime., Disp: 90 tablet, Rfl: 0 .  tamsulosin (FLOMAX) 0.4 MG CAPS, Take 0.4 mg by mouth 2 (two) times daily., Disp: , Rfl:  .  timolol (TIMOPTIC-XR) 0.25 % ophthalmic gel-forming, , Disp: , Rfl:  .  valsartan (DIOVAN) 160 MG tablet, TAKE ONE TABLET BY MOUTH ONCE DAILY, Disp: 90 tablet, Rfl: 0  Allergies  Allergen Reactions  . Demerol [Meperidine] Other (See Comments) and Anaphylaxis    MAKES BLOOD PRESSURE BOTTOM OUT hypotension  . Sulfa Antibiotics Other (See Comments)    MAKES BLOOD PRESSURE BOTTOM OUT  . Dilaudid [Hydromorphone Hcl]   . Hydromorphone     Other reaction(s): Unknown MAKES BLOOD PRESSURE BOTTOM OUT  . Actos [Pioglitazone] Other (See Comments) and Hives  . Dapagliflozin Rash  . Penicillins Rash     Review of Systems  Eyes: Negative for blurred vision.  Respiratory: Negative for shortness of breath.   Cardiovascular: Negative for chest pain, palpitations and  orthopnea.  Neurological: Negative for headaches.     Objective  Vitals:   09/21/16 1323  BP: 120/70  Pulse: 65  Resp: 16  Temp: 97.7 F (36.5 C)  TempSrc: Oral  SpO2: 95%  Weight: 243 lb 12.8 oz (110.6 kg)  Height: 6\' 4"  (1.93 m)    Physical Exam  Constitutional: He is oriented to person, place, and time and well-developed, well-nourished, and in no distress.  HENT:  Head: Normocephalic and atraumatic.  Cardiovascular: Normal rate, regular rhythm and normal heart sounds.   No murmur heard. Pulmonary/Chest: Effort normal and breath sounds normal. He has no wheezes.  Abdominal: Soft. Bowel sounds are normal. There is  no tenderness.  Musculoskeletal: He exhibits edema (1+ pitting edema bilateral lower extremities).  Neurological: He is alert and oriented to person, place, and time.  Psychiatric: Mood, memory, affect and judgment normal.  Nursing note and vitals reviewed.      Recent Results (from the past 2160 hour(s))  Lipid Profile     Status: Abnormal   Collection Time: 06/23/16  1:56 PM  Result Value Ref Range   Cholesterol 112 <200 mg/dL   Triglycerides 296 (H) <150 mg/dL   HDL 43 >40 mg/dL   Total CHOL/HDL Ratio 2.6 <5.0 Ratio   VLDL 59 (H) <30 mg/dL   LDL Cholesterol 10 <100 mg/dL     Assessment & Plan  1. Benign essential HTN BP stable on present and hypertensive therapy - valsartan (DIOVAN) 160 MG tablet; Take 1 tablet (160 mg total) by mouth daily.  Dispense: 90 tablet; Refill: 0 - bisoprolol-hydrochlorothiazide (ZIAC) 10-6.25 MG tablet; Take 1 tablet by mouth daily.  Dispense: 90 tablet; Refill: 0  2. Mixed hyperlipidemia  - rosuvastatin (CRESTOR) 10 MG tablet; Take 1 tablet (10 mg total) by mouth at bedtime.  Dispense: 90 tablet; Refill: 0 - Lipid panel - COMPLETE METABOLIC PANEL WITH GFR  3. Bilateral lower extremity edema Dependent edema, takes hydrochlorothiazide 25 mg daily, refills provided - hydrochlorothiazide (HYDRODIURIL) 25 MG  tablet; Take 1 tablet (25 mg total) by mouth daily.  Dispense: 90 tablet; Refill: 0   Malasha Kleppe [facility] 09/21/2016 1:30 PM

## 2016-09-29 ENCOUNTER — Other Ambulatory Visit: Payer: Self-pay | Admitting: Family Medicine

## 2016-09-29 ENCOUNTER — Ambulatory Visit
Admission: RE | Admit: 2016-09-29 | Discharge: 2016-09-29 | Disposition: A | Discharge status: Home / Self Care | Payer: Medicare Other | Source: Ambulatory Visit | Attending: Family Medicine | Admitting: Family Medicine

## 2016-09-29 DIAGNOSIS — E782 Mixed hyperlipidemia: Secondary | ICD-10-CM | POA: Diagnosis not present

## 2016-09-29 DIAGNOSIS — Z794 Long term (current) use of insulin: Secondary | ICD-10-CM | POA: Diagnosis not present

## 2016-09-29 DIAGNOSIS — E1165 Type 2 diabetes mellitus with hyperglycemia: Secondary | ICD-10-CM | POA: Diagnosis not present

## 2016-09-29 DIAGNOSIS — M79601 Pain in right arm: Secondary | ICD-10-CM | POA: Diagnosis not present

## 2016-09-29 DIAGNOSIS — Z79899 Other long term (current) drug therapy: Secondary | ICD-10-CM | POA: Diagnosis not present

## 2016-09-29 DIAGNOSIS — M19011 Primary osteoarthritis, right shoulder: Secondary | ICD-10-CM | POA: Diagnosis not present

## 2016-09-29 NOTE — Progress Notes (Unsigned)
Right shoulder pain radiating down the right arm, no neck pain. Will order X rays of right shoulder, upper and lower arm.

## 2016-09-30 LAB — COMPLETE METABOLIC PANEL WITH GFR
ALK PHOS: 30 U/L — AB (ref 40–115)
ALT: 19 U/L (ref 9–46)
AST: 21 U/L (ref 10–35)
Albumin: 4.4 g/dL (ref 3.6–5.1)
BUN: 25 mg/dL (ref 7–25)
CALCIUM: 9.7 mg/dL (ref 8.6–10.3)
CO2: 28 mmol/L (ref 20–31)
Chloride: 105 mmol/L (ref 98–110)
Creat: 0.84 mg/dL (ref 0.70–1.18)
GFR, EST NON AFRICAN AMERICAN: 84 mL/min (ref >=60)
GFR, Est African American: >89 mL/min (ref >=60)
GLUCOSE: 98 mg/dL (ref 65–99)
POTASSIUM: 3.9 mmol/L (ref 3.5–5.3)
SODIUM: 142 mmol/L (ref 135–146)
Total Bilirubin: 0.7 mg/dL (ref 0.2–1.2)
Total Protein: 6.6 g/dL (ref 6.1–8.1)

## 2016-09-30 LAB — LIPID PANEL
CHOL/HDL RATIO: 2.6 ratio (ref <5.0)
Cholesterol: 97 mg/dL (ref <200)
HDL: 38 mg/dL — AB (ref >40)
LDL Cholesterol: 25 mg/dL (ref <100)
Triglycerides: 172 mg/dL — ABNORMAL HIGH (ref <150)
VLDL: 34 mg/dL — ABNORMAL HIGH (ref <30)

## 2016-10-15 DIAGNOSIS — H401122 Primary open-angle glaucoma, left eye, moderate stage: Secondary | ICD-10-CM | POA: Diagnosis not present

## 2016-10-21 ENCOUNTER — Encounter: Payer: Self-pay | Admitting: Family Medicine

## 2016-10-22 DIAGNOSIS — M7541 Impingement syndrome of right shoulder: Secondary | ICD-10-CM | POA: Diagnosis not present

## 2016-11-02 DIAGNOSIS — L851 Acquired keratosis [keratoderma] palmaris et plantaris: Secondary | ICD-10-CM | POA: Diagnosis not present

## 2016-11-02 DIAGNOSIS — B351 Tinea unguium: Secondary | ICD-10-CM | POA: Diagnosis not present

## 2016-11-02 DIAGNOSIS — E1142 Type 2 diabetes mellitus with diabetic polyneuropathy: Secondary | ICD-10-CM | POA: Diagnosis not present

## 2016-11-02 DIAGNOSIS — Z794 Long term (current) use of insulin: Secondary | ICD-10-CM | POA: Diagnosis not present

## 2016-11-02 DIAGNOSIS — E1165 Type 2 diabetes mellitus with hyperglycemia: Secondary | ICD-10-CM | POA: Diagnosis not present

## 2016-11-12 DIAGNOSIS — Z794 Long term (current) use of insulin: Secondary | ICD-10-CM | POA: Diagnosis not present

## 2016-11-12 DIAGNOSIS — E1165 Type 2 diabetes mellitus with hyperglycemia: Secondary | ICD-10-CM | POA: Diagnosis not present

## 2016-11-13 ENCOUNTER — Telehealth: Payer: Self-pay | Admitting: Family Medicine

## 2016-11-17 DIAGNOSIS — N401 Enlarged prostate with lower urinary tract symptoms: Secondary | ICD-10-CM | POA: Diagnosis not present

## 2016-11-17 DIAGNOSIS — N138 Other obstructive and reflux uropathy: Secondary | ICD-10-CM | POA: Diagnosis not present

## 2016-11-17 DIAGNOSIS — Z6829 Body mass index (BMI) 29.0-29.9, adult: Secondary | ICD-10-CM | POA: Diagnosis not present

## 2016-11-17 DIAGNOSIS — N323 Diverticulum of bladder: Secondary | ICD-10-CM | POA: Diagnosis not present

## 2016-11-17 DIAGNOSIS — R339 Retention of urine, unspecified: Secondary | ICD-10-CM | POA: Diagnosis not present

## 2016-12-21 ENCOUNTER — Ambulatory Visit: Payer: Medicare Other | Admitting: Family Medicine

## 2016-12-28 ENCOUNTER — Ambulatory Visit: Payer: Medicare Other | Admitting: Family Medicine

## 2017-01-04 ENCOUNTER — Ambulatory Visit (INDEPENDENT_AMBULATORY_CARE_PROVIDER_SITE_OTHER): Payer: Medicare Other | Admitting: Family Medicine

## 2017-01-04 ENCOUNTER — Encounter: Payer: Self-pay | Admitting: Family Medicine

## 2017-01-04 VITALS — BP 122/68 | HR 68 | Temp 98.3°F | Resp 16 | Ht 76.0 in | Wt 228.1 lb

## 2017-01-04 DIAGNOSIS — R600 Localized edema: Secondary | ICD-10-CM

## 2017-01-04 DIAGNOSIS — I10 Essential (primary) hypertension: Secondary | ICD-10-CM

## 2017-01-04 DIAGNOSIS — E782 Mixed hyperlipidemia: Secondary | ICD-10-CM

## 2017-01-04 MED ORDER — HYDROCHLOROTHIAZIDE 25 MG PO TABS: 25.0000 mg | ORAL_TABLET | Freq: Every day | ORAL | 0 refills | Status: DC

## 2017-01-04 MED ORDER — ROSUVASTATIN CALCIUM 10 MG PO TABS: 10.0000 mg | ORAL_TABLET | Freq: Every day | ORAL | 0 refills | Status: DC

## 2017-01-04 MED ORDER — BISOPROLOL-HYDROCHLOROTHIAZIDE 10-6.25 MG PO TABS: 1.0000 | ORAL_TABLET | Freq: Every day | ORAL | 0 refills | Status: DC

## 2017-01-04 MED ORDER — LOSARTAN POTASSIUM 50 MG PO TABS: 50.0000 mg | ORAL_TABLET | Freq: Every day | ORAL | 3 refills | Status: DC

## 2017-01-04 NOTE — Progress Notes (Signed)
Name: Jeremy [name]   MRN: [id number]    DOB: April 07, 1936   Date:01/04/2017       Progress Note  Subjective  Chief Complaint  Chief Complaint  Patient presents with  . Follow-up    3 mo  . Medication Management    please prescribe Finasteride per Urologist    Hypertension  This is a chronic problem. The problem is unchanged. The problem is controlled. Pertinent negatives include no blurred vision, chest pain, headaches, orthopnea, palpitations or shortness of breath. Past treatments include beta blockers, diuretics and angiotensin blockers. There is no history of kidney disease, CAD/MI or CVA.  Hyperlipidemia  This is a chronic problem. The problem is uncontrolled. Recent lipid tests were reviewed and are high (elevated Triglycerides.). Pertinent negatives include no chest pain, leg pain, myalgias or shortness of breath. Current antihyperlipidemic treatment includes statins.     Past Medical History:  Diagnosis Date  . Diabetes mellitus without complication ([place])   . Diabetic neuropathy ([place])   . Diverticula, colon 1980's  . Diverticulosis   . Glaucoma   . Hemorrhoids   . High cholesterol   . History of hiatal hernia   . Hyperlipidemia   . Hypertension   . Onychomycosis   . Small bowel obstruction ([place]) 1990's  . Urination disorder     Past Surgical History:  Procedure Laterality Date  . APPENDECTOMY    . BLADDER DIVERTICULECTOMY  2013  . COLON SURGERY Left 1980's  . COLONOSCOPY W/ POLYPECTOMY  1990's   Dr Sharlet Salina  . COLONOSCOPY WITH PROPOFOL N/A 12/21/2014   Procedure: COLONOSCOPY WITH PROPOFOL;  Surgeon: Lollie Sails, MD;  Location: [facility] ENDOSCOPY;  Service: Endoscopy;  Laterality: N/A;  . ESOPHAGOGASTRODUODENOSCOPY (EGD) WITH PROPOFOL N/A 02/15/2015   Procedure: ESOPHAGOGASTRODUODENOSCOPY (EGD) WITH PROPOFOL;  Surgeon: Lollie Sails, MD;  Location: [facility] ENDOSCOPY;  Service: Endoscopy;  Laterality: N/A;  . EYE SURGERY    . GLAUCOMA SURGERY Left [place]     [place], Dr Sharlet Salina  . INGUINAL HERNIA REPAIR Bilateral 1960's  . LARYNX SURGERY  1990   3 times  . RETINAL DETACHMENT SURGERY Right   . TONSILLECTOMY    . URETER SURGERY  2013   Dr Jacqlyn Larsen    Family History  Problem Relation Age of Onset  . Diabetes Mother   . Cataracts Mother   . Cataracts Father   . Glaucoma Father   . Cancer Sister        gallbladder    Social History   Social History  . Marital status: Married    Spouse name: N/A  . Number of children: N/A  . Years of education: N/A   Occupational History  . Not on file.   Social History Main Topics  . Smoking status: Never Smoker  . Smokeless tobacco: Never Used  . Alcohol use No  . Drug use: No  . Sexual activity: No   Other Topics Concern  . Not on file   Social History Narrative  . No narrative on file     Current Outpatient Prescriptions:  .  Alcaftadine (LASTACAFT) 0.25 % SOLN, Apply to eye., Disp: , Rfl:  .  bisoprolol-hydrochlorothiazide (ZIAC) 10-6.25 MG tablet, Take 1 tablet by mouth daily., Disp: 90 tablet, Rfl: 0 .  brimonidine (ALPHAGAN P) 0.1 % SOLN, , Disp: , Rfl:  .  BYDUREON 2 MG PEN, INJECT 2 MG SUBCUTANEOUSLY ONCE  A WEEK., Disp: 4 each, Rfl: 2 .  dorzolamide-timolol (COSOPT) 22.3-6.8 MG/ML ophthalmic solution, 1 drop., Disp: , Rfl:  .  finasteride (PROSCAR) 5 MG tablet, Take 5 mg by mouth daily., Disp: , Rfl:  .  hydrochlorothiazide (HYDRODIURIL) 25 MG tablet, Take 1 tablet (25 mg total) by mouth daily., Disp: 90 tablet, Rfl: 0 .  INVOKANA 300 MG TABS tablet, TAKE ONE TABLET BY MOUTH ONCE DAILY, Disp: 90 tablet, Rfl: 0 .  meloxicam (MOBIC) 15 MG tablet, Take 15 mg by mouth daily as needed., Disp: , Rfl:  .  moxifloxacin (AVELOX) 400 MG tablet, Take 1 tablet (400 mg total) by mouth daily at 8 pm., Disp: 10 tablet, Rfl: 0 .  ranitidine (ZANTAC) 150 MG tablet, Take 150 mg by mouth daily as needed., Disp: , Rfl:  .   rosuvastatin (CRESTOR) 10 MG tablet, Take 1 tablet (10 mg total) by mouth at bedtime., Disp: 90 tablet, Rfl: 0 .  valsartan (DIOVAN) 160 MG tablet, Take 1 tablet (160 mg total) by mouth daily., Disp: 90 tablet, Rfl: 0 .  esomeprazole (NEXIUM) 20 MG capsule, Take 20 mg by mouth daily before breakfast., Disp: , Rfl:  .  ketotifen (ZADITOR) 0.025 % ophthalmic solution, 1 drop., Disp: , Rfl:  .  metFORMIN (GLUCOPHAGE) 1000 MG tablet, Take 1 tablet (1,000 mg total) by mouth 2 (two) times daily. (Patient not taking: Reported on 01/04/2017), Disp: 180 tablet, Rfl: 1 .  timolol (TIMOPTIC-XR) 0.25 % ophthalmic gel-forming, , Disp: , Rfl:   Allergies  Allergen Reactions  . Demerol [Meperidine] Other (See Comments) and Anaphylaxis    MAKES BLOOD PRESSURE BOTTOM OUT hypotension  . Sulfa Antibiotics Other (See Comments)    MAKES BLOOD PRESSURE BOTTOM OUT  . Dilaudid [Hydromorphone Hcl]   . Hydromorphone     Other reaction(s): Unknown MAKES BLOOD PRESSURE BOTTOM OUT  . Actos [Pioglitazone] Other (See Comments) and Hives  . Dapagliflozin Rash  . Penicillins Rash     Review of Systems  Eyes: Negative for blurred vision.  Respiratory: Negative for shortness of breath.   Cardiovascular: Negative for chest pain, palpitations and orthopnea.  Musculoskeletal: Negative for myalgias.  Neurological: Negative for headaches.     Objective  Vitals:   01/04/17 1407  BP: 122/68  Pulse: 68  Resp: 16  Temp: 98.3 F (36.8 C)  TempSrc: Oral  SpO2: 94%  Weight: 228 lb 1.6 oz (103.5 kg)  Height: 6\' 4"  (1.93 m)    Physical Exam  Constitutional: He is oriented to person, place, and time and well-developed, well-nourished, and in no distress.  HENT:  Head: Normocephalic and atraumatic.  Cardiovascular: Normal rate, regular rhythm and normal heart sounds.   No murmur heard. Pulmonary/Chest: Effort normal and breath sounds normal. He has no wheezes.  Abdominal: Soft. Bowel sounds are normal. There is  no tenderness.  Musculoskeletal: He exhibits edema (trace pitting edema  RLE, 1+ pitting edema LLE).  Neurological: He is alert and oriented to person, place, and time.  Psychiatric: Mood, memory, affect and judgment normal.  Nursing note and vitals reviewed.   Assessment & Plan  1. Benign essential HTN BP stable on present antihypertensive treatment - losartan (COZAAR) 50 MG tablet; Take 1 tablet (50 mg total) by mouth daily.  Dispense: 90 tablet; Refill: 3 - bisoprolol-hydrochlorothiazide (ZIAC) 10-6.25 MG tablet; Take 1 tablet by mouth daily.  Dispense: 90 tablet; Refill: 0  2. Mixed hyperlipidemia  - rosuvastatin (CRESTOR) 10 MG tablet; Take 1 tablet (10 mg  total) by mouth at bedtime.  Dispense: 90 tablet; Refill: 0 - Lipid panel  3. Bilateral lower extremity edema  - hydrochlorothiazide (HYDRODIURIL) 25 MG tablet; Take 1 tablet (25 mg total) by mouth daily.  Dispense: 90 tablet; Refill: 0    Billy Turvey [facility] 01/04/2017 2:48 PM

## 2017-01-08 DIAGNOSIS — E782 Mixed hyperlipidemia: Secondary | ICD-10-CM | POA: Diagnosis not present

## 2017-01-09 LAB — LIPID PANEL
CHOL/HDL RATIO: 2.6 ratio (ref <5.0)
CHOLESTEROL: 100 mg/dL (ref <200)
HDL: 38 mg/dL — AB (ref >40)
LDL Cholesterol: 25 mg/dL (ref <100)
TRIGLYCERIDES: 187 mg/dL — AB (ref <150)
VLDL: 37 mg/dL — AB (ref <30)

## 2017-02-02 DIAGNOSIS — E1142 Type 2 diabetes mellitus with diabetic polyneuropathy: Secondary | ICD-10-CM | POA: Diagnosis not present

## 2017-02-02 DIAGNOSIS — B351 Tinea unguium: Secondary | ICD-10-CM | POA: Diagnosis not present

## 2017-02-02 DIAGNOSIS — L851 Acquired keratosis [keratoderma] palmaris et plantaris: Secondary | ICD-10-CM | POA: Diagnosis not present

## 2017-02-04 DIAGNOSIS — I10 Essential (primary) hypertension: Secondary | ICD-10-CM | POA: Diagnosis not present

## 2017-02-04 DIAGNOSIS — E1165 Type 2 diabetes mellitus with hyperglycemia: Secondary | ICD-10-CM | POA: Diagnosis not present

## 2017-02-22 ENCOUNTER — Ambulatory Visit (INDEPENDENT_AMBULATORY_CARE_PROVIDER_SITE_OTHER): Payer: Medicare Other

## 2017-02-22 VITALS — BP 138/78 | HR 64 | Temp 98.4°F | Ht 76.0 in | Wt 232.2 lb

## 2017-02-22 DIAGNOSIS — Z0000 Encounter for general adult medical examination without abnormal findings: Secondary | ICD-10-CM

## 2017-02-22 DIAGNOSIS — Z23 Encounter for immunization: Secondary | ICD-10-CM | POA: Diagnosis not present

## 2017-02-22 NOTE — Progress Notes (Signed)
Subjective:   Jeremy [name] is a 81 y.o. male who presents for Medicare Annual/Subsequent preventive examination.  Review of Systems:  N/A  Cardiac Risk Factors include: advanced age (>60men, >57 women);dyslipidemia;diabetes mellitus;hypertension;male gender     Objective:    Vitals: BP 138/78 (BP Location: Left Arm)   Pulse 64   Temp 98.4 F (36.9 C) (Oral)   Ht 6\' 4"  (1.93 m)   Wt 232 lb 3.2 oz (105.3 kg)   BMI 28.26 kg/m   Body mass index is 28.26 kg/m.  Tobacco History  Smoking Status  . Never Smoker  Smokeless Tobacco  . Never Used     Counseling given: Not Answered   Past Medical History:  Diagnosis Date  . Diabetes mellitus without complication ([place])   . Diabetic neuropathy ([place])   . Diverticula, colon 1980's  . Diverticulosis   . Glaucoma   . Hemorrhoids   . High cholesterol   . History of hiatal hernia   . Hyperlipidemia   . Hypertension   . Onychomycosis   . Small bowel obstruction ([place]) 1990's  . Urination disorder    Past Surgical History:  Procedure Laterality Date  . APPENDECTOMY    . BLADDER DIVERTICULECTOMY  2013  . COLON SURGERY Left 1980's  . COLONOSCOPY W/ POLYPECTOMY  1990's   Dr Sharlet Salina  . COLONOSCOPY WITH PROPOFOL N/A 12/21/2014   Procedure: COLONOSCOPY WITH PROPOFOL;  Surgeon: Lollie Sails, MD;  Location: [facility] ENDOSCOPY;  Service: Endoscopy;  Laterality: N/A;  . ESOPHAGOGASTRODUODENOSCOPY (EGD) WITH PROPOFOL N/A 02/15/2015   Procedure: ESOPHAGOGASTRODUODENOSCOPY (EGD) WITH PROPOFOL;  Surgeon: Lollie Sails, MD;  Location: [facility] ENDOSCOPY;  Service: Endoscopy;  Laterality: N/A;  . EYE SURGERY    . GLAUCOMA SURGERY Left [place]     [place], Dr Sharlet Salina  . INGUINAL HERNIA REPAIR Bilateral 1960's  . LARYNX SURGERY  1990   3 times  . RETINAL DETACHMENT SURGERY Right   . TONSILLECTOMY    . URETER SURGERY  2013   Dr Jacqlyn Larsen   Family History  Problem  Relation Age of Onset  . Diabetes Mother   . Cataracts Mother   . Cataracts Father   . Glaucoma Father   . Cancer Sister        gallbladder   History  Sexual Activity  . Sexual activity: No    Outpatient Encounter Prescriptions as of 02/22/2017  Medication Sig  . bisoprolol-hydrochlorothiazide (ZIAC) 10-6.25 MG tablet Take 1 tablet by mouth daily.  . brimonidine (ALPHAGAN P) 0.1 % SOLN   . BYDUREON 2 MG PEN INJECT 2 MG SUBCUTANEOUSLY ONCE A WEEK.  . dorzolamide-timolol (COSOPT) 22.3-6.8 MG/ML ophthalmic solution 1 drop.  . finasteride (PROSCAR) 5 MG tablet Take 5 mg by mouth daily.  . hydrochlorothiazide (HYDRODIURIL) 25 MG tablet Take 1 tablet (25 mg total) by mouth daily.  . INVOKANA 300 MG TABS tablet TAKE ONE TABLET BY MOUTH ONCE DAILY  . losartan (COZAAR) 50 MG tablet Take 1 tablet (50 mg total) by mouth daily.  . meloxicam (MOBIC) 15 MG tablet Take 15 mg by mouth daily as needed.  . metFORMIN (GLUCOPHAGE) 1000 MG tablet Take 1 tablet (1,000 mg total) by mouth 2 (two) times daily.  . ranitidine (ZANTAC) 150 MG tablet Take 150 mg by mouth daily as needed.  . rosuvastatin (CRESTOR) 10 MG tablet Take 1 tablet (10 mg total) by mouth at  bedtime.  . timolol (TIMOPTIC-XR) 0.25 % ophthalmic gel-forming   . [DISCONTINUED] Alcaftadine (LASTACAFT) 0.25 % SOLN Apply to eye.  . [DISCONTINUED] esomeprazole (NEXIUM) 20 MG capsule Take 20 mg by mouth daily before breakfast.  . [DISCONTINUED] ketotifen (ZADITOR) 0.025 % ophthalmic solution 1 drop.  . [DISCONTINUED] moxifloxacin (AVELOX) 400 MG tablet Take 1 tablet (400 mg total) by mouth daily at 8 pm.   No facility-administered encounter medications on file as of 02/22/2017.     Activities of Daily Living In your present state of health, do you have any difficulty performing the following activities: 02/22/2017 01/04/2017  Hearing? N N  Vision? N Y  Comment - glasses  Difficulty concentrating or making decisions? N N  Walking or climbing  stairs? N N  Dressing or bathing? N N  Doing errands, shopping? N N  Preparing Food and eating ? N -  Using the Toilet? N -  In the past six months, have you accidently leaked urine? N -  Do you have problems with loss of bowel control? N -  Managing your Medications? N -  Managing your Finances? N -  Housekeeping or managing your Housekeeping? N -  Some recent data might be hidden    Patient Care Team: Roselee Nova, MD as PCP - General (Family Medicine) Christene Lye, MD (General Surgery) Requested, Self   Assessment:     Exercise Activities and Dietary recommendations Current Exercise Habits: The patient does not participate in regular exercise at present (only gardening), Exercise limited by: None identified  Goals    . Increase water intake          Recommend increasing water intake to 6 glasses of water a day.       Fall Risk Fall Risk  02/22/2017 01/04/2017 09/21/2016 06/23/2016 03/27/2016  Falls in the past year? No No No No No   Depression Screen PHQ 2/9 Scores 02/22/2017 01/04/2017 09/21/2016 06/23/2016  PHQ - 2 Score 0 0 0 0    Cognitive Function: Pt declined screening today.         Immunization History  Administered Date(s) Administered  . Influenza, High Dose Seasonal PF 03/12/2016, 02/22/2017  . Influenza,inj,Quad PF,6+ Mos 03/28/2015  . Pneumococcal Conjugate-13 08/07/2013, 09/04/2013  . Pneumococcal Polysaccharide-23 04/04/2012  . Tdap 04/04/2012  . Zoster 09/17/2010   Screening Tests Health Maintenance  Topic Date Due  . FOOT EXAM  03/31/1946  . HEMOGLOBIN A1C  09/25/2016  . OPHTHALMOLOGY EXAM  10/15/2017  . TETANUS/TDAP  04/04/2022  . INFLUENZA VACCINE  Completed  . PNA vac Low Risk Adult  Completed      Plan:  I have personally reviewed and addressed the Medicare Annual Wellness questionnaire and have noted the following in the patient's chart:  A. Medical and social history B. Use of alcohol, tobacco or illicit drugs   C. Current medications and supplements D. Functional ability and status E.  Nutritional status F.  Physical activity G. Advance directives H. List of other physicians I.  Hospitalizations, surgeries, and ER visits in previous 12 months J.  [place] such as hearing and vision if needed, cognitive and depression L. Referrals and appointments - none  In addition, I have reviewed and discussed with patient certain preventive protocols, quality metrics, and best practice recommendations. A written personalized care plan for preventive services as well as general preventive health recommendations were provided to patient.  See attached scanned questionnaire for additional information.   Signed,  Alyson Ingles  Kebin Maye, LPN Nurse Health Advisor   MD Recommendations: Pt needs a diabetic foot exam and his Hgb A1c checked at next OV.

## 2017-02-22 NOTE — Patient Instructions (Signed)
[name] , Thank you for taking time to come for your Medicare Wellness Visit. I appreciate your ongoing commitment to your health goals. Please review the following plan we discussed and let me know if I can assist you in the future.   Screening recommendations/referrals: Colonoscopy: up to date Recommended yearly ophthalmology/optometry visit for glaucoma screening and checkup Recommended yearly dental visit for hygiene and checkup  Vaccinations: Influenza vaccine: completed today Pneumococcal vaccine: completed series Tdap vaccine: up to date Shingles vaccine: completed 09/17/10  Advanced directives: Please bring a copy of your POA (Power of [place]) and/or Living Will to your next appointment.   Conditions/risks identified: Recommend increasing water intake to 6 glasses of water a day.   Next appointment: 04/07/17  Preventive Care 65 Years and Older, Male Preventive care refers to lifestyle choices and visits with your health care provider that can promote health and wellness. What does preventive care include?  A yearly physical exam. This is also called an annual well check.  Dental exams once or twice a year.  Routine eye exams. Ask your health care provider how often you should have your eyes checked.  Personal lifestyle choices, including:  Daily care of your teeth and gums.  Regular physical activity.  Eating a healthy diet.  Avoiding tobacco and drug use.  Limiting alcohol use.  Practicing safe sex.  Taking low doses of aspirin every day.  Taking vitamin and mineral supplements as recommended by your health care provider. What happens during an annual well check? The services and screenings done by your health care provider during your annual well check will depend on your age, overall health, lifestyle risk factors, and family history of disease. Counseling  Your health care provider may ask you questions about your:  Alcohol use.  Tobacco  use.  Drug use.  Emotional well-being.  Home and relationship well-being.  Sexual activity.  Eating habits.  History of falls.  Memory and ability to understand (cognition).  Work and work Statistician. Screening  You may have the following tests or measurements:  Height, weight, and BMI.  Blood pressure.  Lipid and cholesterol levels. These may be checked every 5 years, or more frequently if you are over 12 years old.  Skin check.  Lung cancer screening. You may have this screening every year starting at age 85 if you have a 30-pack-year history of smoking and currently smoke or have quit within the past 15 years.  Fecal occult blood test (FOBT) of the stool. You may have this test every year starting at age 10.  Flexible sigmoidoscopy or colonoscopy. You may have a sigmoidoscopy every 5 years or a colonoscopy every 10 years starting at age 55.  Prostate cancer screening. Recommendations will vary depending on your family history and other risks.  Hepatitis C blood test.  Hepatitis B blood test.  Sexually transmitted disease (STD) testing.  Diabetes screening. This is done by checking your blood sugar (glucose) after you have not eaten for a while (fasting). You may have this done every 1-3 years.  Abdominal aortic aneurysm (AAA) screening. You may need this if you are a current or former smoker.  Osteoporosis. You may be screened starting at age 51 if you are at high risk. Talk with your health care provider about your test results, treatment options, and if necessary, the need for more tests. Vaccines  Your health care provider may recommend certain vaccines, such as:  Influenza vaccine. This is recommended every year.  Tetanus, diphtheria,  and acellular pertussis (Tdap, Td) vaccine. You may need a Td booster every 10 years.  Zoster vaccine. You may need this after age 81.  Pneumococcal 13-valent conjugate (PCV13) vaccine. One dose is recommended after age  29.  Pneumococcal polysaccharide (PPSV23) vaccine. One dose is recommended after age 35. Talk to your health care provider about which screenings and vaccines you need and how often you need them. This information is not intended to replace advice given to you by your health care provider. Make sure you discuss any questions you have with your health care provider. Document Released: 06/28/2015 Document Revised: 02/19/2016 Document Reviewed: 04/02/2015 Elsevier Interactive Patient Education  2017 [employer] Prevention in the Home Falls can cause injuries. They can happen to people of all ages. There are many things you can do to make your home safe and to help prevent falls. What can I do on the outside of my home?  Regularly fix the edges of walkways and driveways and fix any cracks.  Remove anything that might make you trip as you walk through a door, such as a raised step or threshold.  Trim any bushes or trees on the path to your home.  Use bright outdoor lighting.  Clear any walking paths of anything that might make someone trip, such as rocks or tools.  Regularly check to see if handrails are loose or broken. Make sure that both sides of any steps have handrails.  Any raised decks and porches should have guardrails on the edges.  Have any leaves, snow, or ice cleared regularly.  Use sand or salt on walking paths during winter.  Clean up any spills in your garage right away. This includes oil or grease spills. What can I do in the bathroom?  Use night lights.  Install grab bars by the toilet and in the tub and shower. Do not use towel bars as grab bars.  Use non-skid mats or decals in the tub or shower.  If you need to sit down in the shower, use a plastic, non-slip stool.  Keep the floor dry. Clean up any water that spills on the floor as soon as it happens.  Remove soap buildup in the tub or shower regularly.  Attach bath mats securely with double-sided  non-slip rug tape.  Do not have throw rugs and other things on the floor that can make you trip. What can I do in the bedroom?  Use night lights.  Make sure that you have a light by your bed that is easy to reach.  Do not use any sheets or blankets that are too big for your bed. They should not hang down onto the floor.  Have a firm chair that has side arms. You can use this for support while you get dressed.  Do not have throw rugs and other things on the floor that can make you trip. What can I do in the kitchen?  Clean up any spills right away.  Avoid walking on wet floors.  Keep items that you use a lot in easy-to-reach places.  If you need to reach something above you, use a strong step stool that has a grab bar.  Keep electrical cords out of the way.  Do not use floor polish or wax that makes floors slippery. If you must use wax, use non-skid floor wax.  Do not have throw rugs and other things on the floor that can make you trip. What can I do with my  stairs?  Do not leave any items on the stairs.  Make sure that there are handrails on both sides of the stairs and use them. Fix handrails that are broken or loose. Make sure that handrails are as long as the stairways.  Check any carpeting to make sure that it is firmly attached to the stairs. Fix any carpet that is loose or worn.  Avoid having throw rugs at the top or bottom of the stairs. If you do have throw rugs, attach them to the floor with carpet tape.  Make sure that you have a light switch at the top of the stairs and the bottom of the stairs. If you do not have them, ask someone to add them for you. What else can I do to help prevent falls?  Wear shoes that:  Do not have high heels.  Have rubber bottoms.  Are comfortable and fit you well.  Are closed at the toe. Do not wear sandals.  If you use a stepladder:  Make sure that it is fully opened. Do not climb a closed stepladder.  Make sure that both  sides of the stepladder are locked into place.  Ask someone to hold it for you, if possible.  Clearly mark and make sure that you can see:  Any grab bars or handrails.  First and last steps.  Where the edge of each step is.  Use tools that help you move around (mobility aids) if they are needed. These include:  Canes.  Walkers.  Scooters.  Crutches.  Turn on the lights when you go into a dark area. Replace any light bulbs as soon as they burn out.  Set up your furniture so you have a clear path. Avoid moving your furniture around.  If any of your floors are uneven, fix them.  If there are any pets around you, be aware of where they are.  Review your medicines with your doctor. Some medicines can make you feel dizzy. This can increase your chance of falling. Ask your doctor what other things that you can do to help prevent falls. This information is not intended to replace advice given to you by your health care provider. Make sure you discuss any questions you have with your health care provider. Document Released: 03/28/2009 Document Revised: 11/07/2015 Document Reviewed: 07/06/2014 Elsevier Interactive Patient Education  2017 [employer].

## 2017-03-04 DIAGNOSIS — E1165 Type 2 diabetes mellitus with hyperglycemia: Secondary | ICD-10-CM | POA: Diagnosis not present

## 2017-03-04 DIAGNOSIS — Z794 Long term (current) use of insulin: Secondary | ICD-10-CM | POA: Diagnosis not present

## 2017-03-04 LAB — HEMOGLOBIN A1C: Hemoglobin A1C: 6.6

## 2017-03-04 LAB — BASIC METABOLIC PANEL: GLUCOSE: 143

## 2017-03-11 DIAGNOSIS — Z794 Long term (current) use of insulin: Secondary | ICD-10-CM | POA: Diagnosis not present

## 2017-03-11 DIAGNOSIS — E785 Hyperlipidemia, unspecified: Secondary | ICD-10-CM | POA: Diagnosis not present

## 2017-03-11 DIAGNOSIS — I10 Essential (primary) hypertension: Secondary | ICD-10-CM | POA: Diagnosis not present

## 2017-03-11 DIAGNOSIS — E1159 Type 2 diabetes mellitus with other circulatory complications: Secondary | ICD-10-CM | POA: Diagnosis not present

## 2017-03-11 DIAGNOSIS — E1169 Type 2 diabetes mellitus with other specified complication: Secondary | ICD-10-CM | POA: Diagnosis not present

## 2017-03-11 DIAGNOSIS — E119 Type 2 diabetes mellitus without complications: Secondary | ICD-10-CM | POA: Diagnosis not present

## 2017-03-14 DIAGNOSIS — E1169 Type 2 diabetes mellitus with other specified complication: Secondary | ICD-10-CM | POA: Insufficient documentation

## 2017-03-14 DIAGNOSIS — E785 Hyperlipidemia, unspecified: Secondary | ICD-10-CM

## 2017-03-15 NOTE — Telephone Encounter (Signed)
AWV completed °

## 2017-04-07 ENCOUNTER — Ambulatory Visit: Payer: Medicare Other | Admitting: Family Medicine

## 2017-04-08 ENCOUNTER — Encounter: Payer: Self-pay | Admitting: Family Medicine

## 2017-04-08 ENCOUNTER — Ambulatory Visit (INDEPENDENT_AMBULATORY_CARE_PROVIDER_SITE_OTHER): Payer: Medicare Other | Admitting: Family Medicine

## 2017-04-08 VITALS — BP 128/76 | HR 64 | Temp 98.3°F | Resp 14 | Ht 76.0 in | Wt 231.1 lb

## 2017-04-08 DIAGNOSIS — I10 Essential (primary) hypertension: Secondary | ICD-10-CM

## 2017-04-08 DIAGNOSIS — E782 Mixed hyperlipidemia: Secondary | ICD-10-CM | POA: Diagnosis not present

## 2017-04-08 DIAGNOSIS — R600 Localized edema: Secondary | ICD-10-CM | POA: Diagnosis not present

## 2017-04-08 MED ORDER — ROSUVASTATIN CALCIUM 10 MG PO TABS: 10.0000 mg | ORAL_TABLET | Freq: Every day | ORAL | 0 refills | Status: DC

## 2017-04-08 MED ORDER — HYDROCHLOROTHIAZIDE 25 MG PO TABS: 25.0000 mg | ORAL_TABLET | Freq: Every day | ORAL | 0 refills | Status: DC

## 2017-04-08 NOTE — Progress Notes (Signed)
Name: Jeremy [name]   MRN: [id number]    DOB: 1935/10/22   Date:04/08/2017       Progress Note  Subjective  Chief Complaint  Chief Complaint  Patient presents with  . Hyperlipidemia    f/u  . Hypertension    f/u    Hyperlipidemia  This is a chronic problem. The problem is uncontrolled. Recent lipid tests were reviewed and are high (elevated TG). Pertinent negatives include no chest pain, leg pain, myalgias or shortness of breath. Current antihyperlipidemic treatment includes statins. There are no compliance problems.  Risk factors for coronary artery disease include diabetes mellitus and dyslipidemia.  Hypertension  This is a chronic problem. The problem is unchanged. The problem is controlled. Pertinent negatives include no blurred vision, chest pain, headaches, palpitations or shortness of breath. Past treatments include angiotensin blockers, diuretics and beta blockers.    Past Medical History:  Diagnosis Date  . Diabetes mellitus without complication ([place])   . Diabetic neuropathy ([place])   . Diverticula, colon 1980's  . Diverticulosis   . Glaucoma   . Hemorrhoids   . High cholesterol   . History of hiatal hernia   . Hyperlipidemia   . Hypertension   . Onychomycosis   . Small bowel obstruction ([place]) 1990's  . Urination disorder     Past Surgical History:  Procedure Laterality Date  . APPENDECTOMY    . BLADDER DIVERTICULECTOMY  2013  . COLON SURGERY Left 1980's  . COLONOSCOPY W/ POLYPECTOMY  1990's   Dr Sharlet Salina  . COLONOSCOPY WITH PROPOFOL N/A 12/21/2014   Procedure: COLONOSCOPY WITH PROPOFOL;  Surgeon: Lollie Sails, MD;  Location: [facility] ENDOSCOPY;  Service: Endoscopy;  Laterality: N/A;  . ESOPHAGOGASTRODUODENOSCOPY (EGD) WITH PROPOFOL N/A 02/15/2015   Procedure: ESOPHAGOGASTRODUODENOSCOPY (EGD) WITH PROPOFOL;  Surgeon: Lollie Sails, MD;  Location[facility] END[facility]COPY;  Service: Endoscopy;  Laterality: N/A;  . EYE SURGERY    . GLAUCOMA SURGERY Left [place]     [place], Dr Sharlet Salina  . INGUINAL HERNIA REPAIR Bilateral 1960's  . LARYNX SURGERY  1990   3 times  . RETINAL DETACHMENT SURGERY Right   . TONSILLECTOMY    . URETER SURGERY  2013   Dr Jacqlyn Larsen    Family History  Problem Relation Age of Onset  . Diabetes Mother   . Cataracts Mother   . Cataracts Father   . Glaucoma Father   . Cancer Sister        gallbladder    Social History   Social History  . Marital status: Married    Spouse name: N/A  . Number of children: N/A  . Years of education: N/A   Occupational History  . Not on file.   Social History Main Topics  . Smoking status: Never Smoker  . Smokeless tobacco: Never Used  . Alcohol use No  . Drug use: No  . Sexual activity: No   Other Topics Concern  . Not on file   Social History Narrative  . No narrative on file     Current Outpatient Prescriptions:  .  bisoprolol-hydrochlorothiazide (ZIAC) 10-6.25 MG tablet, Take 1 tablet by mouth daily., Disp: 90 tablet, Rfl: 0 .  brimonidine (ALPHAGAN P) 0.1 % SOLN, , Disp: , Rfl:  .  BYDUREON 2 MG PEN, INJECT 2 MG SUBCUTANEOUSLY ONCE A WEEK., Disp: 4 each, Rfl: 2 .  dorzolamide-timolol (COSOPT) 22.3-6.8 MG/ML ophthalmic  solution, 1 drop., Disp: , Rfl:  .  finasteride (PROSCAR) 5 MG tablet, Take 5 mg by mouth daily., Disp: , Rfl:  .  hydrochlorothiazide (HYDRODIURIL) 25 MG tablet, Take 1 tablet (25 mg total) by mouth daily., Disp: 90 tablet, Rfl: 0 .  Insulin Glargine (LANTUS SOLOSTAR) 100 UNIT/ML Solostar Pen, Inject 90 Units into the skin daily., Disp: , Rfl:  .  INVOKANA 300 MG TABS tablet, TAKE ONE TABLET BY MOUTH ONCE DAILY, Disp: 90 tablet, Rfl: 0 .  losartan (COZAAR) 50 MG tablet, Take 1 tablet (50 mg total) by mouth daily., Disp: 90 tablet, Rfl: 3 .  meloxicam (MOBIC) 15 MG tablet, Take 15 mg by mouth daily as needed., Disp: , Rfl:  .  metFORMIN (GLUCOPHAGE) 1000 MG tablet, Take 1 tablet (1,000 mg  total) by mouth 2 (two) times daily., Disp: 180 tablet, Rfl: 1 .  ranitidine (ZANTAC) 150 MG tablet, Take 150 mg by mouth daily as needed., Disp: , Rfl:  .  rosuvastatin (CRESTOR) 10 MG tablet, Take 1 tablet (10 mg total) by mouth at bedtime., Disp: 90 tablet, Rfl: 0 .  timolol (TIMOPTIC-XR) 0.25 % ophthalmic gel-forming, , Disp: , Rfl:   Allergies  Allergen Reactions  . Demerol [Meperidine] Other (See Comments) and Anaphylaxis    MAKES BLOOD PRESSURE BOTTOM OUT hypotension  . Sulfa Antibiotics Other (See Comments)    MAKES BLOOD PRESSURE BOTTOM OUT  . Dilaudid [Hydromorphone Hcl]   . Hydromorphone     Other reaction(s): Unknown MAKES BLOOD PRESSURE BOTTOM OUT  . Actos [Pioglitazone] Other (See Comments) and Hives  . Dapagliflozin Rash  . Penicillins Rash     Review of Systems  Eyes: Negative for blurred vision.  Respiratory: Negative for shortness of breath.   Cardiovascular: Negative for chest pain and palpitations.  Musculoskeletal: Negative for myalgias.  Neurological: Negative for headaches.     Objective  Vitals:   04/08/17 1453  BP: 128/76  Pulse: 64  Resp: 14  Temp: 98.3 F (36.8 C)  TempSrc: Oral  SpO2: 94%  Weight: 231 lb 1.6 oz (104.8 kg)  Height: 6\' 4"  (1.93 m)    Physical Exam  Constitutional: He is oriented to person, place, and time and well-developed, well-nourished, and in no distress.  HENT:  Head: Normocephalic and atraumatic.  Cardiovascular: Normal rate, regular rhythm and normal heart sounds.   No murmur heard. Pulmonary/Chest: Effort normal and breath sounds normal. He has no wheezes.  Musculoskeletal: He exhibits no edema.  Neurological: He is alert and oriented to person, place, and time.  Psychiatric: Mood, memory, affect and judgment normal.  Nursing note and vitals reviewed.     Recent Results (from the past 2160 hour(s))  Basic metabolic panel     Status: None   Collection Time: 03/04/17 12:00 AM  Result Value Ref Range    Glucose 143   Hemoglobin A1c     Status: None   Collection Time: 03/04/17 12:00 AM  Result Value Ref Range   Hemoglobin A1C 6.6      Assessment & Plan  1. Bilateral lower extremity edema Stable on hydrochlorothiazide taken every day, check potassium level - hydrochlorothiazide (HYDRODIURIL) 25 MG tablet; Take 1 tablet (25 mg total) by mouth daily.  Dispense: 90 tablet; Refill: 0  2. Mixed hyperlipidemia FLP overall at goal, continue on statin - COMPLETE METABOLIC PANEL WITH GFR - Lipid panel - rosuvastatin (CRESTOR) 10 MG tablet; Take 1 tablet (10 mg total) by mouth at bedtime.  Dispense: 90  tablet; Refill: 0  3. Benign essential HTN BP stable on present anti- hypertensive treatment   Delontae Lamm Asad A. [facility] 04/08/2017 3:04 PM

## 2017-04-15 DIAGNOSIS — H401122 Primary open-angle glaucoma, left eye, moderate stage: Secondary | ICD-10-CM | POA: Diagnosis not present

## 2017-05-10 DIAGNOSIS — B351 Tinea unguium: Secondary | ICD-10-CM | POA: Diagnosis not present

## 2017-05-10 DIAGNOSIS — L851 Acquired keratosis [keratoderma] palmaris et plantaris: Secondary | ICD-10-CM | POA: Diagnosis not present

## 2017-05-10 DIAGNOSIS — E1142 Type 2 diabetes mellitus with diabetic polyneuropathy: Secondary | ICD-10-CM | POA: Diagnosis not present

## 2017-05-28 ENCOUNTER — Other Ambulatory Visit: Payer: Self-pay | Admitting: Family Medicine

## 2017-05-28 NOTE — Telephone Encounter (Signed)
Copied from [place]. Topic: Quick Communication - Rx Refill/Question >> May 28, 2017 11:42 AM Marin Olp L wrote: Has the patient contacted their pharmacy? No. (new req) (Agent: If no, request that the patient contact the pharmacy for the refill.) Preferred Pharmacy (with phone number or street name): [facility], [place] Agent: Please be advised that RX refills may take up to 3 business days. We ask that you follow-up with your pharmacy. finasteride (PROSCAR) 5 MG tablet

## 2017-05-28 NOTE — Telephone Encounter (Signed)
Refill for Proscar.

## 2017-06-01 ENCOUNTER — Other Ambulatory Visit: Payer: Self-pay

## 2017-06-02 ENCOUNTER — Other Ambulatory Visit: Payer: Self-pay

## 2017-06-02 NOTE — Telephone Encounter (Signed)
Spoke to the pt and he stated that his urologist informed him no further evaluation  is needed and his PCP should be able to continue to mange his Proscar. Looked at Dr. Jacqlyn Larsen at [facility] notes and saw that he and he mention the option to follow-up with him as needed due to the pt continued to do well since his last evaluation. pt last visit with Dr. Jacqlyn Larsen was 11/17/2016. Dr. Jacqlyn Larsen prescibed a year worth of medication on 06/09/2016.

## 2017-06-03 MED ORDER — FINASTERIDE 5 MG PO TABS: 5.0000 mg | ORAL_TABLET | Freq: Every day | ORAL | 0 refills | Status: DC

## 2017-06-10 ENCOUNTER — Ambulatory Visit
Admission: RE | Admit: 2017-06-10 | Discharge: 2017-06-10 | Disposition: A | Discharge status: Home / Self Care | Payer: Medicare Other | Source: Ambulatory Visit | Attending: Gastroenterology | Admitting: Gastroenterology

## 2017-06-10 ENCOUNTER — Encounter
Admission: RE | Disposition: A | Discharge status: Home / Self Care | Payer: Self-pay | Source: Ambulatory Visit | Attending: Gastroenterology

## 2017-06-10 ENCOUNTER — Ambulatory Visit: Payer: Medicare Other | Admitting: Anesthesiology

## 2017-06-10 ENCOUNTER — Encounter: Payer: Self-pay | Admitting: *Deleted

## 2017-06-10 DIAGNOSIS — E782 Mixed hyperlipidemia: Secondary | ICD-10-CM | POA: Diagnosis not present

## 2017-06-10 DIAGNOSIS — K3189 Other diseases of stomach and duodenum: Secondary | ICD-10-CM | POA: Diagnosis not present

## 2017-06-10 DIAGNOSIS — Z8719 Personal history of other diseases of the digestive system: Secondary | ICD-10-CM | POA: Insufficient documentation

## 2017-06-10 DIAGNOSIS — Z882 Allergy status to sulfonamides status: Secondary | ICD-10-CM | POA: Diagnosis not present

## 2017-06-10 DIAGNOSIS — N181 Chronic kidney disease, stage 1: Secondary | ICD-10-CM | POA: Diagnosis not present

## 2017-06-10 DIAGNOSIS — Z79899 Other long term (current) drug therapy: Secondary | ICD-10-CM | POA: Diagnosis not present

## 2017-06-10 DIAGNOSIS — E1139 Type 2 diabetes mellitus with other diabetic ophthalmic complication: Secondary | ICD-10-CM | POA: Diagnosis not present

## 2017-06-10 DIAGNOSIS — Z888 Allergy status to other drugs, medicaments and biological substances status: Secondary | ICD-10-CM | POA: Diagnosis not present

## 2017-06-10 DIAGNOSIS — K449 Diaphragmatic hernia without obstruction or gangrene: Secondary | ICD-10-CM | POA: Insufficient documentation

## 2017-06-10 DIAGNOSIS — Z880 Allergy status to penicillin: Secondary | ICD-10-CM | POA: Diagnosis not present

## 2017-06-10 DIAGNOSIS — E7800 Pure hypercholesterolemia, unspecified: Secondary | ICD-10-CM | POA: Insufficient documentation

## 2017-06-10 DIAGNOSIS — I129 Hypertensive chronic kidney disease with stage 1 through stage 4 chronic kidney disease, or unspecified chronic kidney disease: Secondary | ICD-10-CM | POA: Diagnosis not present

## 2017-06-10 DIAGNOSIS — I10 Essential (primary) hypertension: Secondary | ICD-10-CM | POA: Insufficient documentation

## 2017-06-10 DIAGNOSIS — Z794 Long term (current) use of insulin: Secondary | ICD-10-CM | POA: Diagnosis not present

## 2017-06-10 DIAGNOSIS — E1040 Type 1 diabetes mellitus with diabetic neuropathy, unspecified: Secondary | ICD-10-CM | POA: Diagnosis not present

## 2017-06-10 DIAGNOSIS — Z885 Allergy status to narcotic agent status: Secondary | ICD-10-CM | POA: Diagnosis not present

## 2017-06-10 DIAGNOSIS — K2950 Unspecified chronic gastritis without bleeding: Secondary | ICD-10-CM | POA: Diagnosis not present

## 2017-06-10 DIAGNOSIS — E1022 Type 1 diabetes mellitus with diabetic chronic kidney disease: Secondary | ICD-10-CM | POA: Diagnosis not present

## 2017-06-10 DIAGNOSIS — E1140 Type 2 diabetes mellitus with diabetic neuropathy, unspecified: Secondary | ICD-10-CM | POA: Diagnosis not present

## 2017-06-10 DIAGNOSIS — K259 Gastric ulcer, unspecified as acute or chronic, without hemorrhage or perforation: Secondary | ICD-10-CM | POA: Diagnosis not present

## 2017-06-10 DIAGNOSIS — K2210 Ulcer of esophagus without bleeding: Secondary | ICD-10-CM | POA: Insufficient documentation

## 2017-06-10 DIAGNOSIS — K7460 Unspecified cirrhosis of liver: Secondary | ICD-10-CM | POA: Insufficient documentation

## 2017-06-10 DIAGNOSIS — K2960 Other gastritis without bleeding: Secondary | ICD-10-CM | POA: Diagnosis not present

## 2017-06-10 DIAGNOSIS — K2980 Duodenitis without bleeding: Secondary | ICD-10-CM | POA: Insufficient documentation

## 2017-06-10 DIAGNOSIS — H42 Glaucoma in diseases classified elsewhere: Secondary | ICD-10-CM | POA: Diagnosis not present

## 2017-06-10 DIAGNOSIS — K208 Other esophagitis: Secondary | ICD-10-CM | POA: Diagnosis not present

## 2017-06-10 DIAGNOSIS — K2940 Chronic atrophic gastritis without bleeding: Secondary | ICD-10-CM | POA: Diagnosis not present

## 2017-06-10 HISTORY — PX: ESOPHAGOGASTRODUODENOSCOPY (EGD) WITH PROPOFOL: SHX5813

## 2017-06-10 LAB — COMPLETE METABOLIC PANEL WITH GFR
AG Ratio: 2.4 (calc) (ref 1.0–2.5)
ALBUMIN MSPROF: 4.3 g/dL (ref 3.6–5.1)
ALKALINE PHOSPHATASE (APISO): 33 U/L — AB (ref 40–115)
ALT: 18 U/L (ref 9–46)
AST: 16 U/L (ref 10–35)
BILIRUBIN TOTAL: 0.7 mg/dL (ref 0.2–1.2)
BUN: 18 mg/dL (ref 7–25)
CHLORIDE: 104 mmol/L (ref 98–110)
CO2: 32 mmol/L (ref 20–32)
CREATININE: 0.75 mg/dL (ref 0.70–1.18)
Calcium: 9.1 mg/dL (ref 8.6–10.3)
GFR, EST AFRICAN AMERICAN: 101 mL/min/{1.73_m2} (ref >=60)
GFR, Est Non African American: 87 mL/min/{1.73_m2} (ref >=60)
GLUCOSE: 74 mg/dL (ref 65–99)
Globulin: 1.8 g/dL (calc) — ABNORMAL LOW (ref 1.9–3.7)
Potassium: 4 mmol/L (ref 3.5–5.3)
Sodium: 142 mmol/L (ref 135–146)
TOTAL PROTEIN: 6.1 g/dL (ref 6.1–8.1)

## 2017-06-10 LAB — CBC WITH DIFFERENTIAL/PLATELET
Basophils Absolute: 0 10*3/uL (ref 0–0.1)
Basophils Relative: 1 %
EOS PCT: 7 %
Eosinophils Absolute: 0.3 10*3/uL (ref 0–0.7)
HCT: 45.8 % (ref 40.0–52.0)
Hemoglobin: 15.5 g/dL (ref 13.0–18.0)
LYMPHS ABS: 1.5 10*3/uL (ref 1.0–3.6)
LYMPHS PCT: 31 %
MCH: 33 pg (ref 26.0–34.0)
MCHC: 33.9 g/dL (ref 32.0–36.0)
MCV: 97.3 fL (ref 80.0–100.0)
MONO ABS: 0.5 10*3/uL (ref 0.2–1.0)
MONOS PCT: 11 %
Neutro Abs: 2.5 10*3/uL (ref 1.4–6.5)
Neutrophils Relative %: 50 %
PLATELETS: 139 10*3/uL — AB (ref 150–440)
RBC: 4.7 MIL/uL (ref 4.40–5.90)
RDW: 13.3 % (ref 11.5–14.5)
WBC: 4.9 10*3/uL (ref 3.8–10.6)

## 2017-06-10 LAB — LIPID PANEL
CHOL/HDL RATIO: 2.3 (calc) (ref <5.0)
CHOLESTEROL: 98 mg/dL (ref <200)
HDL: 43 mg/dL (ref >40)
LDL CHOLESTEROL (CALC): 32 mg/dL
Non-HDL Cholesterol (Calc): 55 mg/dL (calc) (ref <130)
TRIGLYCERIDES: 152 mg/dL — AB (ref <150)

## 2017-06-10 LAB — PROTIME-INR
INR: 0.91
Prothrombin Time: 12.2 seconds (ref 11.4–15.2)

## 2017-06-10 LAB — GLUCOSE, CAPILLARY: Glucose-Capillary: 78 mg/dL (ref 65–99)

## 2017-06-10 SURGERY — ESOPHAGOGASTRODUODENOSCOPY (EGD) WITH PROPOFOL
Anesthesia: General

## 2017-06-10 MED ORDER — LIDOCAINE HCL (PF) 2 % IJ SOLN
INTRAMUSCULAR | Status: AC
  Filled 2017-06-10: qty 10

## 2017-06-10 MED ORDER — PROPOFOL 500 MG/50ML IV EMUL
INTRAVENOUS | Status: DC | PRN
  Administered 2017-06-10: 120 ug/kg/min via INTRAVENOUS

## 2017-06-10 MED ORDER — PROPOFOL 10 MG/ML IV BOLUS
INTRAVENOUS | Status: DC | PRN
  Administered 2017-06-10: 30 mg via INTRAVENOUS

## 2017-06-10 MED ORDER — FENTANYL CITRATE (PF) 100 MCG/2ML IJ SOLN
INTRAMUSCULAR | Status: AC
  Filled 2017-06-10: qty 2

## 2017-06-10 MED ORDER — FENTANYL CITRATE (PF) 100 MCG/2ML IJ SOLN
INTRAMUSCULAR | Status: DC | PRN
  Administered 2017-06-10 (×2): 50 ug via INTRAVENOUS

## 2017-06-10 MED ORDER — PROPOFOL 500 MG/50ML IV EMUL
INTRAVENOUS | Status: AC
  Filled 2017-06-10: qty 50

## 2017-06-10 MED ORDER — SODIUM CHLORIDE 0.9 % IV SOLN
INTRAVENOUS | Status: DC
  Administered 2017-06-10: 07:00:00 via INTRAVENOUS

## 2017-06-10 NOTE — Anesthesia Preprocedure Evaluation (Addendum)
Anesthesia Evaluation  Patient identified by MRN, date of birth, ID band Patient awake    Reviewed: Allergy & Precautions, NPO status , Patient's Chart, lab work & pertinent test results  Airway Mallampati: II       Dental  (+) Teeth Intact   Pulmonary neg pulmonary ROS,    breath sounds clear to auscultation       Cardiovascular Exercise Tolerance: Good hypertension, Pt. on medications and Pt. on home beta blockers  Rhythm:Regular     Neuro/Psych negative neurological ROS     GI/Hepatic Neg liver ROS, hiatal hernia,   Endo/Other  diabetes, Type 1, Insulin Dependent  Renal/GU Renal InsufficiencyRenal disease     Musculoskeletal negative musculoskeletal ROS (+)   Abdominal (+) + obese,   Peds negative pediatric ROS (+)  Hematology negative hematology ROS (+)   Anesthesia Other Findings   Reproductive/Obstetrics                            Anesthesia Physical Anesthesia Plan  ASA: III  Anesthesia Plan: General   Post-op Pain Management:    Induction: Intravenous  PONV Risk Score and Plan:   Airway Management Planned: Natural Airway and Nasal Cannula  Additional Equipment:   Intra-op Plan:   Post-operative Plan:   Informed Consent: I have reviewed the patients History and Physical, chart, labs and discussed the procedure including the risks, benefits and alternatives for the proposed anesthesia with the patient or authorized representative who has indicated his/her understanding and acceptance.     Plan Discussed with: Surgeon  Anesthesia Plan Comments:         Anesthesia Quick Evaluation

## 2017-06-10 NOTE — Anesthesia Post-op Follow-up Note (Signed)
Anesthesia QCDR form completed.        

## 2017-06-10 NOTE — Op Note (Signed)
[facility] Gastroenterology Patient Name: Jeremy [name] Procedure Date: 06/10/2017 6:51 AM MRN: [id number] Account #: [id number] Date of Birth: 05-Mar-1936 Admit Type: Outpatient Age: 81 Room: [facility] ENDO ROOM 1 Gender: Male Note Status: Finalized Procedure:            Upper GI endoscopy Indications:          Cirrhosis rule out esophageal varices Providers:            Lollie Sails, MD Referring MD:         Otila Back. Manuella Ghazi (Referring MD) Medicines:            Monitored Anesthesia Care Complications:        No immediate complications. Procedure:            Pre-Anesthesia Assessment:                       - ASA Grade Assessment: III - A patient with severe                        systemic disease.                       After obtaining informed consent, the endoscope was                        passed under direct vision. Throughout the procedure,                        the patient's blood pressure, pulse, and oxygen                        saturations were monitored continuously. The Endoscope                        was introduced through the mouth, and advanced to the                        third part of duodenum. The upper GI endoscopy was                        accomplished without difficulty. The patient tolerated                        the procedure well. Findings:      multiple prominant veins/vwenous lakes noted from the lower upper third       of the esophagus to the distal third of the esophagus. There are no red       wale marks or other indications of bleeding.      LA Grade C (one or more mucosal breaks continuous between tops of 2 or       more mucosal folds, less than 75% circumference) esophagitis with no       bleeding was found, with erosions and evidence of ulceration. Biopsies       were taken with a cold forceps for histology.      A small hiatal hernia was found. The Z-line was a variable distance from       incisors; the hiatal hernia was  sliding.      Diffuse mild inflammation characterized by congestion (edema), erosions       and erythema was  found in the prepyloric region of the stomach. Biopsies       were taken with a cold forceps for histology. Biopsies were taken with a       cold forceps for Helicobacter pylori testing.      The cardia and gastric fundus were normal on retroflexion otherwise.      Patchy minimal inflammation characterized by erythema was found in the       duodenal bulb.      The exam of the duodenum was otherwise normal. Impression:           - LA Grade C erosive esophagitis. Biopsied.                       - Small hiatal hernia.                       - Erosive gastritis. Biopsied.                       - Duodenitis. Recommendation:       - Use Protonix (pantoprazole) 40 mg PO BID for 4 weeks.                       - Use Protonix (pantoprazole) 40 mg PO daily daily.                       - Return to GI clinic in 1 week.                       - May be necessary to repeat scope in 6-8 weeks to                        check healing depending on biopsy result. Procedure Code(s):    --- Professional ---                       [phone], Esophagogastroduodenoscopy, flexible, transoral;                        with biopsy, single or multiple Diagnosis Code(s):    --- Professional ---                       K20.8, Other esophagitis                       K44.9, Diaphragmatic hernia without obstruction or                        gangrene                       K29.60, Other gastritis without bleeding                       K29.80, Duodenitis without bleeding                       K74.60, Unspecified cirrhosis of liver CPT copyright 2016 American Medical Association. All rights reserved. The codes documented in this report are preliminary and upon coder review may  be revised to meet current compliance requirements. Lollie Sails, MD 06/10/2017 8:44:34 AM This report has been signed electronically. Number of  Addenda:  0 Note Initiated On: 06/10/2017 6:51 AM      [facility]

## 2017-06-10 NOTE — Transfer of Care (Signed)
Immediate Anesthesia Transfer of Care Note  Patient: Jeremy [name]  Procedure(s) Performed: ESOPHAGOGASTRODUODENOSCOPY (EGD) WITH PROPOFOL (N/A )  Patient Location: PACU  Anesthesia Type:General  Level of Consciousness: awake  Airway & Oxygen Therapy: Patient Spontanous Breathing and Patient connected to nasal cannula oxygen  Post-op Assessment: Report given to RN and Post -op Vital signs reviewed and stable  Post vital signs: Reviewed  Last Vitals:  Vitals:   06/10/17 0730  BP: (!) 140/99  Pulse: 61  Resp: 18  Temp: (!) 35.6 C  SpO2: 97%    Last Pain:  Vitals:   06/10/17 0730  TempSrc: Tympanic         Complications: No apparent anesthesia complications

## 2017-06-10 NOTE — Anesthesia Postprocedure Evaluation (Signed)
Anesthesia Post Note  Patient: Jeremy [name]  Procedure(s) Performed: ESOPHAGOGASTRODUODENOSCOPY (EGD) WITH PROPOFOL (N/A )  Patient location during evaluation: PACU Anesthesia Type: General Level of consciousness: awake Pain management: pain level controlled Vital Signs Assessment: post-procedure vital signs reviewed and stable Respiratory status: spontaneous breathing Cardiovascular status: stable Anesthetic complications: no     Last Vitals:  Vitals:   06/10/17 0850 06/10/17 0900  BP: (!) 97/56 111/65  Pulse: (!) 52 (!) 50  Resp: 12 14  Temp:    SpO2: 94% 95%    Last Pain:  Vitals:   06/10/17 0840  TempSrc: Tympanic                 VAN STAVEREN,Sitlali Koerner

## 2017-06-10 NOTE — H&P (Signed)
Outpatient short stay form Pre-procedure 06/10/2017 7:42 AM Jeremy Sails MD  Primary Physician: Keith Rake M.D.  Reason for visit:  EGD  History of present illness:  Patient is a 81 year old male presenting today as above. He has a history of noncirrhotic esophageal varices with questionable early fibrosis/cirrhosis of the liver. He is presenting today for routine EGD. In review of his chart he has not had a CBC or ProTime 14 years and we will recheck that before his procedures today. He takes no aspirin or blood to any agents. He is been doing well overall.    Current Facility-Administered Medications:  .  0.9 %  sodium chloride infusion, , Intravenous, Continuous, Jeremy Sails, MD, Last Rate: 20 mL/hr at 06/10/17 7672  Medications Prior to Admission  Medication Sig Dispense Refill Last Dose  . bisoprolol-hydrochlorothiazide (ZIAC) 10-6.25 MG tablet Take 1 tablet by mouth daily. 90 tablet 0 06/10/2017 at Unknown time  . brimonidine (ALPHAGAN P) 0.1 % SOLN    06/10/2017 at Unknown time  . dorzolamide-timolol (COSOPT) 22.3-6.8 MG/ML ophthalmic solution 1 drop.   06/09/2017 at Unknown time  . finasteride (PROSCAR) 5 MG tablet Take 1 tablet (5 mg total) by mouth daily. 90 tablet 0 Past Week at Unknown time  . hydrochlorothiazide (HYDRODIURIL) 25 MG tablet Take 1 tablet (25 mg total) by mouth daily. 90 tablet 0 06/09/2017 at Unknown time  . Insulin Glargine (LANTUS SOLOSTAR) 100 UNIT/ML Solostar Pen Inject 90 Units into the skin daily.   06/09/2017 at Unknown time  . INVOKANA 300 MG TABS tablet TAKE ONE TABLET BY MOUTH ONCE DAILY 90 tablet 0 06/10/2017 at Unknown time  . losartan (COZAAR) 50 MG tablet Take 1 tablet (50 mg total) by mouth daily. 90 tablet 3 06/10/2017 at Unknown time  . meloxicam (MOBIC) 15 MG tablet Take 15 mg by mouth daily as needed.   Past Month at Unknown time  . metFORMIN (GLUCOPHAGE) 1000 MG tablet Take 1 tablet (1,000 mg total) by mouth 2 (two) times daily. 180  tablet 1 06/10/2017 at Unknown time  . ranitidine (ZANTAC) 150 MG tablet Take 150 mg by mouth daily as needed.   06/10/2017 at Unknown time  . rosuvastatin (CRESTOR) 10 MG tablet Take 1 tablet (10 mg total) by mouth at bedtime. 90 tablet 0 06/09/2017 at Unknown time  . timolol (TIMOPTIC-XR) 0.25 % ophthalmic gel-forming    06/10/2017 at Unknown time  . BYDUREON 2 MG PEN INJECT 2 MG SUBCUTANEOUSLY ONCE A WEEK. 4 each 2 Taking     Allergies  Allergen Reactions  . Demerol [Meperidine] Other (See Comments) and Anaphylaxis    MAKES BLOOD PRESSURE BOTTOM OUT hypotension  . Sulfa Antibiotics Other (See Comments)    MAKES BLOOD PRESSURE BOTTOM OUT  . Dilaudid [Hydromorphone Hcl]   . Hydromorphone     Other reaction(s): Unknown MAKES BLOOD PRESSURE BOTTOM OUT  . Actos [Pioglitazone] Other (See Comments) and Hives  . Dapagliflozin Rash  . Penicillins Rash     Past Medical History:  Diagnosis Date  . Diabetes mellitus without complication ([place])   . Diabetic neuropathy ([place])   . Diverticula, colon 1980's  . Diverticulosis   . Glaucoma   . Hemorrhoids   . High cholesterol   . History of hiatal hernia   . Hyperlipidemia   . Hypertension   . Onychomycosis   . Small bowel obstruction ([place]) 1990's  . Urination disorder     Review of systems:      Physical  Exam    Heart and lungs: Regular rate and rhythm without rub or gallop, lungs are bilaterally clear.    HEENT: Normocephalic atraumatic eyes are anicteric    Other:     Pertinant exam for procedure: Soft nontender nondistended, protuberant, no apparent ascites on evaluation. Bowel sounds positive normoactive.    Planned proceedures: EGD and indicated procedures. I have discussed the risks benefits and complications of procedures to include not limited to bleeding, infection, perforation and the risk of sedation and the patient wishes to proceed.    Jeremy Sails, MD Gastroenterology 06/10/2017  7:42 AM

## 2017-06-11 ENCOUNTER — Encounter: Payer: Self-pay | Admitting: Gastroenterology

## 2017-06-11 LAB — SURGICAL PATHOLOGY

## 2017-06-13 ENCOUNTER — Other Ambulatory Visit: Payer: Self-pay | Admitting: Family Medicine

## 2017-06-13 DIAGNOSIS — I10 Essential (primary) hypertension: Secondary | ICD-10-CM

## 2017-06-14 ENCOUNTER — Other Ambulatory Visit: Payer: Self-pay

## 2017-06-14 DIAGNOSIS — R748 Abnormal levels of other serum enzymes: Secondary | ICD-10-CM

## 2017-06-14 DIAGNOSIS — R771 Abnormality of globulin: Secondary | ICD-10-CM

## 2017-06-14 NOTE — Progress Notes (Signed)
CMP recheck in 1-2 weeks due to decreased alkaline phosphatase and Globulin decreased.

## 2017-06-16 ENCOUNTER — Other Ambulatory Visit: Payer: Self-pay | Admitting: Gastroenterology

## 2017-06-16 DIAGNOSIS — K7460 Unspecified cirrhosis of liver: Secondary | ICD-10-CM

## 2017-06-17 ENCOUNTER — Encounter: Payer: Self-pay | Admitting: Gastroenterology

## 2017-06-17 NOTE — Addendum Note (Signed)
Addendum  created 06/17/17 1631 by Nelda Marseille, CRNA   Intraprocedure Event deleted, Intraprocedure Event edited

## 2017-07-07 DIAGNOSIS — R771 Abnormality of globulin: Secondary | ICD-10-CM | POA: Diagnosis not present

## 2017-07-07 DIAGNOSIS — R748 Abnormal levels of other serum enzymes: Secondary | ICD-10-CM | POA: Diagnosis not present

## 2017-07-12 ENCOUNTER — Encounter: Payer: Self-pay | Admitting: Family Medicine

## 2017-07-12 ENCOUNTER — Ambulatory Visit (INDEPENDENT_AMBULATORY_CARE_PROVIDER_SITE_OTHER): Payer: Medicare Other | Admitting: Family Medicine

## 2017-07-12 VITALS — BP 120/74 | HR 61 | Temp 98.5°F | Resp 14 | Wt 235.0 lb

## 2017-07-12 DIAGNOSIS — R600 Localized edema: Secondary | ICD-10-CM | POA: Diagnosis not present

## 2017-07-12 DIAGNOSIS — R771 Abnormality of globulin: Secondary | ICD-10-CM

## 2017-07-12 DIAGNOSIS — I10 Essential (primary) hypertension: Secondary | ICD-10-CM

## 2017-07-12 MED ORDER — TELMISARTAN 80 MG PO TABS: 80.0000 mg | ORAL_TABLET | Freq: Every day | ORAL | 0 refills | Status: DC

## 2017-07-12 MED ORDER — HYDROCHLOROTHIAZIDE 25 MG PO TABS: 25.0000 mg | ORAL_TABLET | Freq: Every day | ORAL | 0 refills | Status: DC

## 2017-07-12 NOTE — Progress Notes (Signed)
Name: Jeremy [name]   MRN: [id number]    DOB: [birth year]   Date:07/12/2017       Progress Note  Subjective  Chief Complaint  Chief Complaint  Patient presents with  . Hypertension    Pt denies any issues  . Hyperlipidemia    Hypertension  This is a chronic problem. The problem is unchanged. The problem is controlled. Pertinent negatives include no blurred vision, chest pain, headaches, orthopnea, palpitations or shortness of breath. Past treatments include beta blockers, diuretics and angiotensin blockers. There is no history of kidney disease, CAD/MI or CVA.  Hyperlipidemia  This is a chronic problem. The problem is controlled. Recent lipid tests were reviewed and are high (elevated Triglycerides.). Pertinent negatives include no chest pain, leg pain, myalgias or shortness of breath. Current antihyperlipidemic treatment includes statins.      Past Medical History:  Diagnosis Date  . Diabetes mellitus without complication ([place])   . Diabetic neuropathy ([place])   . Diverticula, colon 1980's  . Diverticulosis   . Glaucoma   . Hemorrhoids   . High cholesterol   . History of hiatal hernia   . Hyperlipidemia   . Hypertension   . Onychomycosis   . Small bowel obstruction ([place]) 1990's  . Urination disorder     Past Surgical History:  Procedure Laterality Date  . APPENDECTOMY    . BLADDER DIVERTICULECTOMY  2013  . COLON SURGERY Left 1980's  . COLONOSCOPY W/ POLYPECTOMY  1990's   Dr Sharlet Salina  . COLONOSCOPY WITH PROPOFOL N/A 12/21/2014   Procedure: COLONOSCOPY WITH PROPOFOL;  Surgeon: Lollie Sails, MD;  Location[facility] ENDOSCOPY;  Service: Endoscopy;  Laterality: N/A;  . ESOPHAGOGASTRODUODENOSCOPY (EGD) WITH PROPOFOL N/A 02/15/2015   Procedure: ESOPHAGOGASTRODUODENOSCOPY (EGD) WITH PROPOFOL;  Surgeon: Lollie Sails, MD;  Location: [facility] ENDOSCOPY;  Service: Endoscopy;  Laterality: N/A;  . ESOPHAGOGASTRODUODENOSCOPY (EGD) WITH PROPOFOL N/A 06/10/2017   Procedure:  ESOPHAGOGASTRODUODENOSCOPY (EGD) WITH PROPOFOL;  Surgeon: Lollie Sails, MD;  Location: [facility] ENDOSCOPY;  Service: Endoscopy;  Laterality: N/A;  . EYE SURGERY    . GLAUCOMA SURGERY Left [place]     [place], Dr Sharlet Salina  . INGUINAL HERNIA REPAIR Bilateral 1960's  . LARYNX SURGERY  1990   3 times  . RETINAL DETACHMENT SURGERY Right   . TONSILLECTOMY    . URETER SURGERY  2013   Dr Jacqlyn Larsen    Family History  Problem Relation Age of Onset  . Diabetes Mother   . Cataracts Mother   . Cataracts Father   . Glaucoma Father   . Cancer Sister        gallbladder    Social History   Socioeconomic History  . Marital status: Married    Spouse name: Not on file  . Number of children: Not on file  . Years of education: Not on file  . Highest education level: Not on file  Social Needs  . Financial resource strain: Not on file  . Food insecurity - worry: Not on file  . Food insecurity - inability: Not on file  . Transportation needs - medical: Not on file  . Transportation needs - non-medical: Not on file  Occupational History  . Not on file  Tobacco Use  . Smoking status: Never Smoker  . Smokeless tobacco: Never Used  Substance and Sexual Activity  . Alcohol use: No  . Drug use: No  .  Sexual activity: No  Other Topics Concern  . Not on file  Social History Narrative  . Not on file     Current Outpatient Medications:  .  bisoprolol-hydrochlorothiazide (ZIAC) 10-6.25 MG tablet, TAKE 1 TABLET BY MOUTH ONCE DAILY, Disp: 90 tablet, Rfl: 0 .  brimonidine (ALPHAGAN P) 0.1 % SOLN, , Disp: , Rfl:  .  BYDUREON 2 MG PEN, INJECT 2 MG SUBCUTANEOUSLY ONCE A WEEK., Disp: 4 each, Rfl: 2 .  dorzolamide-timolol (COSOPT) 22.3-6.8 MG/ML ophthalmic solution, 1 drop., Disp: , Rfl:  .  esomeprazole (NEXIUM) 20 MG capsule, Take 20 mg by mouth daily., Disp: , Rfl:  .  finasteride (PROSCAR) 5 MG tablet, Take 1 tablet (5 mg  total) by mouth daily., Disp: 90 tablet, Rfl: 0 .  hydrochlorothiazide (HYDRODIURIL) 25 MG tablet, Take 1 tablet (25 mg total) by mouth daily., Disp: 90 tablet, Rfl: 0 .  Insulin Glargine (LANTUS SOLOSTAR) 100 UNIT/ML Solostar Pen, Inject 90 Units into the skin daily., Disp: , Rfl:  .  INVOKANA 300 MG TABS tablet, TAKE ONE TABLET BY MOUTH ONCE DAILY, Disp: 90 tablet, Rfl: 0 .  losartan (COZAAR) 50 MG tablet, Take 1 tablet (50 mg total) by mouth daily., Disp: 90 tablet, Rfl: 3 .  meloxicam (MOBIC) 15 MG tablet, Take 15 mg by mouth daily as needed., Disp: , Rfl:  .  metFORMIN (GLUCOPHAGE) 1000 MG tablet, Take 1 tablet (1,000 mg total) by mouth 2 (two) times daily., Disp: 180 tablet, Rfl: 1 .  ranitidine (ZANTAC) 150 MG tablet, Take 150 mg by mouth daily as needed., Disp: , Rfl:  .  rosuvastatin (CRESTOR) 10 MG tablet, Take 1 tablet (10 mg total) by mouth at bedtime., Disp: 90 tablet, Rfl: 0 .  timolol (TIMOPTIC-XR) 0.25 % ophthalmic gel-forming, , Disp: , Rfl:   Allergies  Allergen Reactions  . Demerol [Meperidine] Other (See Comments) and Anaphylaxis    MAKES BLOOD PRESSURE BOTTOM OUT hypotension  . Sulfa Antibiotics Other (See Comments)    MAKES BLOOD PRESSURE BOTTOM OUT  . Dilaudid [Hydromorphone Hcl]   . Hydromorphone     Other reaction(s): Unknown MAKES BLOOD PRESSURE BOTTOM OUT  . Actos [Pioglitazone] Other (See Comments) and Hives  . Dapagliflozin Rash  . Penicillins Rash     Review of Systems  Eyes: Negative for blurred vision.  Respiratory: Negative for shortness of breath.   Cardiovascular: Negative for chest pain, palpitations and orthopnea.  Musculoskeletal: Negative for myalgias.  Neurological: Negative for headaches.      Objective  Vitals:   07/12/17 1354  BP: 120/74  Pulse: 61  Resp: 14  Temp: 98.5 F (36.9 C)  TempSrc: Oral  SpO2: 96%  Weight: 235 lb (106.6 kg)    Physical Exam  Constitutional: He is oriented to person, place, and time and  well-developed, well-nourished, and in no distress.  HENT:  Head: Normocephalic and atraumatic.  Cardiovascular: Normal rate, regular rhythm and normal heart sounds.  No murmur heard. Pulmonary/Chest: Effort normal and breath sounds normal. He has no wheezes.  Abdominal: Soft. Bowel sounds are normal.  Musculoskeletal: He exhibits edema.  Neurological: He is alert and oriented to person, place, and time.  Psychiatric: Mood, memory, affect and judgment normal.  Nursing note and vitals reviewed.   Assessment & Plan  1. Essential hypertension We will change to ARB to Micardis, blood pressure stable and controlled - telmisartan (MICARDIS) 80 MG tablet; Take 1 tablet (80 mg total) by mouth daily.  Dispense: 90 tablet; Refill:  0  2. Bilateral lower extremity edema  - hydrochlorothiazide (HYDRODIURIL) 25 MG tablet; Take 1 tablet (25 mg total) by mouth daily.  Dispense: 90 tablet; Refill: 0  3. Abnormality of globulin We will repeat globulin levels - Hepatic function panel   Tyrick Dunagan [facility] 07/12/2017 2:07 PM

## 2017-07-14 DIAGNOSIS — E785 Hyperlipidemia, unspecified: Secondary | ICD-10-CM | POA: Diagnosis not present

## 2017-07-14 DIAGNOSIS — I10 Essential (primary) hypertension: Secondary | ICD-10-CM | POA: Diagnosis not present

## 2017-07-14 DIAGNOSIS — E1169 Type 2 diabetes mellitus with other specified complication: Secondary | ICD-10-CM | POA: Diagnosis not present

## 2017-07-14 DIAGNOSIS — E119 Type 2 diabetes mellitus without complications: Secondary | ICD-10-CM | POA: Diagnosis not present

## 2017-07-14 DIAGNOSIS — E1159 Type 2 diabetes mellitus with other circulatory complications: Secondary | ICD-10-CM | POA: Diagnosis not present

## 2017-07-14 DIAGNOSIS — Z794 Long term (current) use of insulin: Secondary | ICD-10-CM | POA: Diagnosis not present

## 2017-07-14 LAB — HEMOGLOBIN A1C: Hemoglobin A1C: 6.4

## 2017-07-15 LAB — COMPLETE METABOLIC PANEL WITH GFR
AG Ratio: 1.9 (calc) (ref 1.0–2.5)
ALBUMIN MSPROF: 4.3 g/dL (ref 3.6–5.1)
ALKALINE PHOSPHATASE (APISO): 35 U/L — AB (ref 40–115)
ALT: 17 U/L (ref 9–46)
AST: 17 U/L (ref 10–35)
BILIRUBIN TOTAL: 0.8 mg/dL (ref 0.2–1.2)
BUN / CREAT RATIO: 27 (calc) — AB (ref 6–22)
BUN: 18 mg/dL (ref 7–25)
CO2: 30 mmol/L (ref 20–32)
CREATININE: 0.67 mg/dL — AB (ref 0.70–1.18)
Calcium: 9.4 mg/dL (ref 8.6–10.3)
Chloride: 102 mmol/L (ref 98–110)
GFR, Est African American: 106 mL/min/{1.73_m2} (ref >=60)
GFR, Est Non African American: 91 mL/min/{1.73_m2} (ref >=60)
GLOBULIN: 2.3 g/dL (ref 1.9–3.7)
Glucose, Bld: 107 mg/dL — ABNORMAL HIGH (ref 65–99)
Potassium: 3.6 mmol/L (ref 3.5–5.3)
SODIUM: 141 mmol/L (ref 135–146)
Total Protein: 6.6 g/dL (ref 6.1–8.1)

## 2017-07-15 LAB — TEST AUTHORIZATION

## 2017-07-15 LAB — ADD ON HEP FUNCT PNL
AG RATIO: 1.9 (calc) (ref 1.0–2.5)
ALBUMIN MSPROF: 4.3 g/dL (ref 3.6–5.1)
ALT: 17 U/L (ref 9–46)
AST: 17 U/L (ref 10–35)
Alkaline phosphatase (APISO): 35 U/L — ABNORMAL LOW (ref 40–115)
BILIRUBIN TOTAL: 0.8 mg/dL (ref 0.2–1.2)
Bilirubin, Direct: 0.1 mg/dL (ref 0.0–0.2)
GLOBULIN: 2.3 g/dL (ref 1.9–3.7)
Indirect Bilirubin: 0.7 mg/dL (calc) (ref 0.2–1.2)
Total Protein: 6.6 g/dL (ref 6.1–8.1)

## 2017-08-09 ENCOUNTER — Ambulatory Visit
Admission: RE | Admit: 2017-08-09 | Discharge: 2017-08-09 | Disposition: A | Discharge status: Home / Self Care | Payer: Medicare Other | Source: Ambulatory Visit | Attending: Gastroenterology | Admitting: Gastroenterology

## 2017-08-09 ENCOUNTER — Other Ambulatory Visit: Payer: Self-pay | Admitting: Gastroenterology

## 2017-08-09 DIAGNOSIS — K7460 Unspecified cirrhosis of liver: Secondary | ICD-10-CM

## 2017-08-09 DIAGNOSIS — K7400 Hepatic fibrosis, unspecified: Secondary | ICD-10-CM

## 2017-08-09 DIAGNOSIS — I10 Essential (primary) hypertension: Secondary | ICD-10-CM | POA: Diagnosis not present

## 2017-08-09 DIAGNOSIS — Z8673 Personal history of transient ischemic attack (TIA), and cerebral infarction without residual deficits: Secondary | ICD-10-CM | POA: Diagnosis not present

## 2017-08-09 DIAGNOSIS — R079 Chest pain, unspecified: Secondary | ICD-10-CM | POA: Diagnosis not present

## 2017-08-09 DIAGNOSIS — E1159 Type 2 diabetes mellitus with other circulatory complications: Secondary | ICD-10-CM | POA: Diagnosis not present

## 2017-08-09 DIAGNOSIS — E119 Type 2 diabetes mellitus without complications: Secondary | ICD-10-CM | POA: Diagnosis not present

## 2017-08-09 DIAGNOSIS — Z794 Long term (current) use of insulin: Secondary | ICD-10-CM | POA: Diagnosis not present

## 2017-08-10 DIAGNOSIS — B351 Tinea unguium: Secondary | ICD-10-CM | POA: Diagnosis not present

## 2017-08-10 DIAGNOSIS — E1142 Type 2 diabetes mellitus with diabetic polyneuropathy: Secondary | ICD-10-CM | POA: Diagnosis not present

## 2017-08-10 DIAGNOSIS — L851 Acquired keratosis [keratoderma] palmaris et plantaris: Secondary | ICD-10-CM | POA: Diagnosis not present

## 2017-08-13 ENCOUNTER — Encounter: Payer: Self-pay | Admitting: *Deleted

## 2017-08-16 ENCOUNTER — Encounter
Admission: RE | Disposition: A | Discharge status: Home / Self Care | Payer: Self-pay | Source: Ambulatory Visit | Attending: Gastroenterology

## 2017-08-16 ENCOUNTER — Encounter: Payer: Self-pay | Admitting: *Deleted

## 2017-08-16 ENCOUNTER — Ambulatory Visit: Payer: Medicare Other | Admitting: Certified Registered Nurse Anesthetist

## 2017-08-16 ENCOUNTER — Ambulatory Visit
Admission: RE | Admit: 2017-08-16 | Discharge: 2017-08-16 | Disposition: A | Discharge status: Home / Self Care | Payer: Medicare Other | Source: Ambulatory Visit | Attending: Gastroenterology | Admitting: Gastroenterology

## 2017-08-16 DIAGNOSIS — Z1211 Encounter for screening for malignant neoplasm of colon: Secondary | ICD-10-CM | POA: Diagnosis not present

## 2017-08-16 DIAGNOSIS — K219 Gastro-esophageal reflux disease without esophagitis: Secondary | ICD-10-CM | POA: Diagnosis not present

## 2017-08-16 DIAGNOSIS — Z6828 Body mass index (BMI) 28.0-28.9, adult: Secondary | ICD-10-CM | POA: Diagnosis not present

## 2017-08-16 DIAGNOSIS — N189 Chronic kidney disease, unspecified: Secondary | ICD-10-CM | POA: Insufficient documentation

## 2017-08-16 DIAGNOSIS — D130 Benign neoplasm of esophagus: Secondary | ICD-10-CM | POA: Insufficient documentation

## 2017-08-16 DIAGNOSIS — K648 Other hemorrhoids: Secondary | ICD-10-CM | POA: Diagnosis not present

## 2017-08-16 DIAGNOSIS — K5790 Diverticulosis of intestine, part unspecified, without perforation or abscess without bleeding: Secondary | ICD-10-CM | POA: Diagnosis not present

## 2017-08-16 DIAGNOSIS — E7800 Pure hypercholesterolemia, unspecified: Secondary | ICD-10-CM | POA: Insufficient documentation

## 2017-08-16 DIAGNOSIS — I8500 Esophageal varices without bleeding: Secondary | ICD-10-CM | POA: Insufficient documentation

## 2017-08-16 DIAGNOSIS — Z791 Long term (current) use of non-steroidal anti-inflammatories (NSAID): Secondary | ICD-10-CM | POA: Insufficient documentation

## 2017-08-16 DIAGNOSIS — E1122 Type 2 diabetes mellitus with diabetic chronic kidney disease: Secondary | ICD-10-CM | POA: Diagnosis not present

## 2017-08-16 DIAGNOSIS — I868 Varicose veins of other specified sites: Secondary | ICD-10-CM | POA: Insufficient documentation

## 2017-08-16 DIAGNOSIS — E1022 Type 1 diabetes mellitus with diabetic chronic kidney disease: Secondary | ICD-10-CM | POA: Diagnosis not present

## 2017-08-16 DIAGNOSIS — Z794 Long term (current) use of insulin: Secondary | ICD-10-CM | POA: Insufficient documentation

## 2017-08-16 DIAGNOSIS — K5730 Diverticulosis of large intestine without perforation or abscess without bleeding: Secondary | ICD-10-CM | POA: Insufficient documentation

## 2017-08-16 DIAGNOSIS — K2970 Gastritis, unspecified, without bleeding: Secondary | ICD-10-CM | POA: Diagnosis not present

## 2017-08-16 DIAGNOSIS — E1040 Type 1 diabetes mellitus with diabetic neuropathy, unspecified: Secondary | ICD-10-CM | POA: Insufficient documentation

## 2017-08-16 DIAGNOSIS — H409 Unspecified glaucoma: Secondary | ICD-10-CM | POA: Diagnosis not present

## 2017-08-16 DIAGNOSIS — I252 Old myocardial infarction: Secondary | ICD-10-CM | POA: Diagnosis not present

## 2017-08-16 DIAGNOSIS — K640 First degree hemorrhoids: Secondary | ICD-10-CM | POA: Insufficient documentation

## 2017-08-16 DIAGNOSIS — E669 Obesity, unspecified: Secondary | ICD-10-CM | POA: Diagnosis not present

## 2017-08-16 DIAGNOSIS — K449 Diaphragmatic hernia without obstruction or gangrene: Secondary | ICD-10-CM | POA: Insufficient documentation

## 2017-08-16 DIAGNOSIS — Z79899 Other long term (current) drug therapy: Secondary | ICD-10-CM | POA: Diagnosis not present

## 2017-08-16 DIAGNOSIS — K210 Gastro-esophageal reflux disease with esophagitis: Secondary | ICD-10-CM | POA: Diagnosis present

## 2017-08-16 DIAGNOSIS — Z86010 Personal history of colonic polyps: Secondary | ICD-10-CM | POA: Diagnosis not present

## 2017-08-16 DIAGNOSIS — E1140 Type 2 diabetes mellitus with diabetic neuropathy, unspecified: Secondary | ICD-10-CM | POA: Diagnosis not present

## 2017-08-16 DIAGNOSIS — K7460 Unspecified cirrhosis of liver: Secondary | ICD-10-CM | POA: Diagnosis not present

## 2017-08-16 DIAGNOSIS — K228 Other specified diseases of esophagus: Secondary | ICD-10-CM | POA: Diagnosis not present

## 2017-08-16 DIAGNOSIS — Z980 Intestinal bypass and anastomosis status: Secondary | ICD-10-CM | POA: Diagnosis not present

## 2017-08-16 DIAGNOSIS — K2950 Unspecified chronic gastritis without bleeding: Secondary | ICD-10-CM | POA: Diagnosis not present

## 2017-08-16 DIAGNOSIS — I129 Hypertensive chronic kidney disease with stage 1 through stage 4 chronic kidney disease, or unspecified chronic kidney disease: Secondary | ICD-10-CM | POA: Insufficient documentation

## 2017-08-16 HISTORY — DX: Obesity, unspecified: E66.9

## 2017-08-16 HISTORY — DX: Gastro-esophageal reflux disease without esophagitis: K21.9

## 2017-08-16 HISTORY — DX: Unspecified convulsions: R56.9

## 2017-08-16 HISTORY — PX: COLONOSCOPY WITH PROPOFOL: SHX5780

## 2017-08-16 HISTORY — PX: ESOPHAGOGASTRODUODENOSCOPY (EGD) WITH PROPOFOL: SHX5813

## 2017-08-16 HISTORY — DX: Benign prostatic hyperplasia without lower urinary tract symptoms: N40.0

## 2017-08-16 HISTORY — DX: Chronic kidney disease, unspecified: N18.9

## 2017-08-16 HISTORY — DX: Acute myocardial infarction, unspecified: I21.9

## 2017-08-16 LAB — GLUCOSE, CAPILLARY: Glucose-Capillary: 87 mg/dL (ref 65–99)

## 2017-08-16 SURGERY — ESOPHAGOGASTRODUODENOSCOPY (EGD) WITH PROPOFOL
Anesthesia: General

## 2017-08-16 MED ORDER — PROPOFOL 500 MG/50ML IV EMUL
INTRAVENOUS | Status: DC | PRN
  Administered 2017-08-16: 140 ug/kg/min via INTRAVENOUS

## 2017-08-16 MED ORDER — PROPOFOL 500 MG/50ML IV EMUL
INTRAVENOUS | Status: AC
  Filled 2017-08-16: qty 50

## 2017-08-16 MED ORDER — SODIUM CHLORIDE 0.9 % IV SOLN: INTRAVENOUS | Status: DC

## 2017-08-16 MED ORDER — PROPOFOL 10 MG/ML IV BOLUS
INTRAVENOUS | Status: DC | PRN
  Administered 2017-08-16: 21 mg via INTRAVENOUS
  Administered 2017-08-16: 100 mg via INTRAVENOUS

## 2017-08-16 MED ORDER — LIDOCAINE HCL (CARDIAC) 20 MG/ML IV SOLN
INTRAVENOUS | Status: DC | PRN
  Administered 2017-08-16: 50 mg via INTRAVENOUS

## 2017-08-16 MED ORDER — SODIUM CHLORIDE 0.9 % IV SOLN
INTRAVENOUS | Status: DC
  Administered 2017-08-16: 1000 mL via INTRAVENOUS

## 2017-08-16 MED ORDER — PROPOFOL 10 MG/ML IV BOLUS
INTRAVENOUS | Status: AC
  Filled 2017-08-16: qty 20

## 2017-08-16 MED ORDER — PHENYLEPHRINE HCL 10 MG/ML IJ SOLN
INTRAMUSCULAR | Status: DC | PRN
  Administered 2017-08-16 (×5): 100 ug via INTRAVENOUS

## 2017-08-16 MED ORDER — LIDOCAINE HCL (PF) 1 % IJ SOLN
2.0000 mL | Freq: Once | INTRAMUSCULAR | Status: AC
  Administered 2017-08-16: 0.3 mL via INTRADERMAL

## 2017-08-16 MED ORDER — LIDOCAINE HCL (PF) 1 % IJ SOLN
INTRAMUSCULAR | Status: AC
  Administered 2017-08-16: 0.3 mL via INTRADERMAL
  Filled 2017-08-16: qty 2

## 2017-08-16 MED ORDER — LIDOCAINE HCL (PF) 2 % IJ SOLN
INTRAMUSCULAR | Status: AC
  Filled 2017-08-16: qty 10

## 2017-08-16 NOTE — Op Note (Signed)
[facility] Gastroenterology Patient Name: Jeremy [name] Procedure Date: 08/16/2017 8:37 AM MRN: [id number] Account #: [id number] Date of Birth: 11/23/1935 Admit Type: Outpatient Age: 82 Room: [facility] ENDO ROOM 1 Gender: Male Note Status: Finalized Procedure:            Colonoscopy Indications:          Personal history of colonic polyps Providers:            Lollie Sails, MD Referring MD:         Bethena Roys. Sowles, MD (Referring MD) Medicines:            Monitored Anesthesia Care Complications:        No immediate complications. Procedure:            Pre-Anesthesia Assessment:                       - ASA Grade Assessment: III - A patient with severe                        systemic disease.                       After obtaining informed consent, the colonoscope was                        passed under direct vision. Throughout the procedure,                        the patient's blood pressure, pulse, and oxygen                        saturations were monitored continuously. The                        Colonoscope was introduced through the anus and                        advanced to the the cecum, identified by appendiceal                        orifice and ileocecal valve. The patient tolerated the                        procedure well. The quality of the bowel preparation                        was fair. Findings:      There was evidence of a prior functional end-to-end colo-colonic       anastomosis, with a short pouch off to one side, at 25 cm proximal to       the anus. This was patent and was characterized by healthy appearing       mucosa.      Multiple medium-mouthed diverticula were found in the entire colon.      Non-bleeding internal hemorrhoids were found during retroflexion. The       hemorrhoids were small and Grade I (internal hemorrhoids that do not       prolapse).      Multiple small to medium internal rectal varicosities noted.   Non-bleeding , no red marks.      The exam was otherwise without  abnormality. Impression:           - Preparation of the colon was fair.                       - Patent functional end-to-end colo-colonic                        anastomosis, characterized by healthy appearing mucosa.                       - Diverticulosis in the entire examined colon.                       - Non-bleeding internal hemorrhoids.                       - The examination was otherwise normal.                       - No specimens collected. Recommendation:       - Discharge patient to home.                       - Repeat colonoscopy in 3 - 5 years for surveillance if                        desired. Procedure Code(s):    --- Professional ---                       [phone], Colonoscopy, flexible; diagnostic, including                        collection of specimen(s) by brushing or washing, when                        performed (separate procedure) Diagnosis Code(s):    --- Professional ---                       K64.0, First degree hemorrhoids                       Z98.0, Intestinal bypass and anastomosis status                       Z86.010, Personal history of colonic polyps                       K57.30, Diverticulosis of large intestine without                        perforation or abscess without bleeding CPT copyright 2016 American Medical Association. All rights reserved. The codes documented in this report are preliminary and upon coder review may  be revised to meet current compliance requirements. Lollie Sails, MD 08/16/2017 9:36:46 AM This report has been signed electronically. Number of Addenda: 0 Note Initiated On: 08/16/2017 8:37 AM Scope Withdrawal Time: 0 hours 9 minutes 13 seconds  Total Procedure Duration: 0 hours 19 minutes 38 seconds       [facility]

## 2017-08-16 NOTE — Op Note (Signed)
[facility] Gastroenterology Patient Name: Jeremy [name] Procedure Date: 08/16/2017 8:40 AM MRN: [id number] Account #: [id number] Date of Birth: [birth year] Admit Type: Outpatient Age: 82 Room: [facility] ENDO ROOM 1 Gender: Male Note Status: Finalized Procedure:            Upper GI endoscopy Indications:          Follow-up of reflux esophagitis Providers:            Lollie Sails, MD Referring MD:         Dr. Ancil Boozer Medicines:            Monitored Anesthesia Care Complications:        No immediate complications. Procedure:            Pre-Anesthesia Assessment:                       - ASA Grade Assessment: III - A patient with severe                        systemic disease.                       After obtaining informed consent, the endoscope was                        passed under direct vision. Throughout the procedure,                        the patient's blood pressure, pulse, and oxygen                        saturations were monitored continuously. The Endoscope                        was introduced through the mouth, and advanced to the                        third part of duodenum. The patient tolerated the                        procedure well. The upper GI endoscopy was accomplished                        without difficulty. Findings:      Multiple esophageal varicosities are noted in an atypical scattered       pattern, beginning in the upper third of the esophagus and seen in a       discontinuous pattern distally to the GE junction. One larger varicosity       is seen at/slightly above the GE junction. This will come and go in       visibility depending on respiration, scope position and insufflated air.       The previously noted esophagitis is noted to be resolved.      The exam of the esophagus was otherwise normal.      Patchy minimal inflammation characterized by erythema was found in the       gastric body.      The examined duodenum was  normal.      A small hiatal hernia was found. The Z-line was a variable distance from       incisors;  the hiatal hernia was sliding.      The cardia and gastric fundus were normal on retroflexion otherwise.      Papilloma -like lesion noted at 36 cm from the incisors, removed and       retrieved with a single pass of a cold forcep. Impression:           - Gastritis.                       - Normal examined duodenum.                       - Small hiatal hernia. Recommendation:       - Use Nexium (esomeprazole) 40 mg PO daily for the rest                        of the patient's life. Procedure Code(s):    --- Professional ---                       [phone], Esophagogastroduodenoscopy, flexible, transoral;                        diagnostic, including collection of specimen(s) by                        brushing or washing, when performed (separate procedure) Diagnosis Code(s):    --- Professional ---                       K29.70, Gastritis, unspecified, without bleeding                       K44.9, Diaphragmatic hernia without obstruction or                        gangrene                       K21.0, Gastro-esophageal reflux disease with esophagitis CPT copyright 2016 American Medical Association. All rights reserved. The codes documented in this report are preliminary and upon coder review may  be revised to meet current compliance requirements. Lollie Sails, MD 08/16/2017 9:07:10 AM This report has been signed electronically. Number of Addenda: 0 Note Initiated On: 08/16/2017 8:40 AM      [facility]

## 2017-08-16 NOTE — Anesthesia Preprocedure Evaluation (Signed)
Anesthesia Evaluation  Patient identified by MRN, date of birth, ID band Patient awake    Reviewed: Allergy & Precautions, NPO status , Patient's Chart, lab work & pertinent test results  History of Anesthesia Complications Negative for: history of anesthetic complications  Airway Mallampati: II       Dental  (+) Teeth Intact   Pulmonary neg pulmonary ROS,    breath sounds clear to auscultation       Cardiovascular Exercise Tolerance: Good hypertension, Pt. on medications and Pt. on home beta blockers + Past MI   Rhythm:Regular     Neuro/Psych negative neurological ROS     GI/Hepatic Neg liver ROS, hiatal hernia,   Endo/Other  diabetes, Type 1, Insulin Dependent  Renal/GU Renal InsufficiencyRenal disease     Musculoskeletal negative musculoskeletal ROS (+)   Abdominal (+) + obese,   Peds negative pediatric ROS (+)  Hematology negative hematology ROS (+)   Anesthesia Other Findings Past Medical History: No date: Chronic kidney disease No date: Diabetes mellitus without complication (H[facility]) No date: Diabetic neuropathy ([place]) 1980's: Diverticula, colon No date: Diverticulosis No date: Enlarged prostate No date: GERD (gastroesophageal reflux disease) No date: Glaucoma No date: Hemorrhoids No date: High cholesterol No date: History of hiatal hernia No date: Hyperlipidemia No date: Hypertension No date: Myocardial infarction ([place]) No date: Obesity No date: Onychomycosis No date: Seizures ([place]) 1990's: Small bowel obstruction (H[facility]) No date: Urination disorder   Reproductive/Obstetrics                             Anesthesia Physical  Anesthesia Plan  ASA: III  Anesthesia Plan: General   Post-op Pain Management:    Induction: Intravenous  PONV Risk Score and Plan: 2 and Propofol infusion  Airway Management Planned: Natural Airway and Nasal Cannula  Additional Equipment:    Intra-op Plan:   Post-operative Plan:   Informed Consent: I have reviewed the patients History and Physical, chart, labs and discussed the procedure including the risks, benefits and alternatives for the proposed anesthesia with the patient or authorized representative who has indicated his/her understanding and acceptance.     Plan Discussed with: Surgeon  Anesthesia Plan Comments:         Anesthesia Quick Evaluation

## 2017-08-16 NOTE — H&P (Signed)
Outpatient short stay form Pre-procedure 08/16/2017 8:22 AMDr Talbert Nan MD  Primary Physician: Dr Keith Rake  Reason for visit: EGD and colonoscopy  History of present illness: Patient is a 82 year old male presenting today as above.  He has a history of marked erosive esophagitis is seen on his last EGD 06/10/2017.  We are reevaluating that.  He has been on a proton pump inhibitor.  There is a history of possible mild fibrosis of the liver with a finding of atypical varicosities in the esophagus.  There is also a history of a colon surgery in the 1980s for removal of a large polyp.  History also of diverticulosis.. Patient tolerated his prep well.  He takes no blood thinners or aspirin products.    Current Facility-Administered Medications:  .  0.9 %  sodium chloride infusion, , Intravenous, Continuous, Lollie Sails, MD  Medications Prior to Admission  Medication Sig Dispense Refill Last Dose  . loratadine (CLARITIN) 10 MG tablet Take 10 mg by mouth daily.     . bisoprolol-hydrochlorothiazide (ZIAC) 10-6.25 MG tablet TAKE 1 TABLET BY MOUTH ONCE DAILY 90 tablet 0 08/16/2017 at 0700  . brimonidine (ALPHAGAN P) 0.1 % SOLN    Taking  . BYDUREON 2 MG PEN INJECT 2 MG SUBCUTANEOUSLY ONCE A WEEK. 4 each 2 Taking  . dorzolamide-timolol (COSOPT) 22.3-6.8 MG/ML ophthalmic solution 1 drop.   Taking  . esomeprazole (NEXIUM) 20 MG capsule Take 20 mg by mouth daily.   Taking  . finasteride (PROSCAR) 5 MG tablet Take 1 tablet (5 mg total) by mouth daily. 90 tablet 0 Taking  . hydrochlorothiazide (HYDRODIURIL) 25 MG tablet Take 1 tablet (25 mg total) by mouth daily. 90 tablet 0   . Insulin Glargine (LANTUS SOLOSTAR) 100 UNIT/ML Solostar Pen Inject 90 Units into the skin daily.   Taking  . INVOKANA 300 MG TABS tablet TAKE ONE TABLET BY MOUTH ONCE DAILY 90 tablet 0 08/16/2017 at 0700  . meloxicam (MOBIC) 15 MG tablet Take 15 mg by mouth daily as needed.   Taking  . metFORMIN (GLUCOPHAGE)  1000 MG tablet Take 1 tablet (1,000 mg total) by mouth 2 (two) times daily. 180 tablet 1 Taking  . ranitidine (ZANTAC) 150 MG tablet Take 150 mg by mouth daily as needed.   Taking  . rosuvastatin (CRESTOR) 10 MG tablet Take 1 tablet (10 mg total) by mouth at bedtime. 90 tablet 0 Taking  . telmisartan (MICARDIS) 80 MG tablet Take 1 tablet (80 mg total) by mouth daily. 90 tablet 0   . timolol (TIMOPTIC-XR) 0.25 % ophthalmic gel-forming    Taking     Allergies  Allergen Reactions  . Demerol [Meperidine] Other (See Comments) and Anaphylaxis    MAKES BLOOD PRESSURE BOTTOM OUT hypotension  . Sulfa Antibiotics Other (See Comments)    MAKES BLOOD PRESSURE BOTTOM OUT  . Dilaudid [Hydromorphone Hcl]   . Hydromorphone     Other reaction(s): Unknown MAKES BLOOD PRESSURE BOTTOM OUT  . Actos [Pioglitazone] Other (See Comments) and Hives  . Dapagliflozin Rash  . Penicillins Rash     Past Medical History:  Diagnosis Date  . Chronic kidney disease   . Diabetes mellitus without complication ([place])   . Diabetic neuropathy ([place])   . Diverticula, colon 1980's  . Diverticulosis   . Enlarged prostate   . GERD (gastroesophageal reflux disease)   . Glaucoma   . Hemorrhoids   . High cholesterol   . History of hiatal hernia   .  Hyperlipidemia   . Hypertension   . Myocardial infarction ([place])   . Obesity   . Onychomycosis   . Seizures ([place])   . Small bowel obstruction ([place]) 1990's  . Urination disorder     Review of systems:      Physical Exam    Heart and lungs: Regular rate and rhythm without rub or gallop, lungs are bilaterally clear.    HEENT: Normocephalic atraumatic eyes are anicteric    Other:    Pertinant exam for procedure: Soft nontender nondistended, protuberant, bowel sounds are positive normoactive.    Planned proceedures: EGD, colonoscopy and indicated procedures. I have discussed the risks benefits and complications of procedures to include not limited to bleeding,  infection, perforation and the risk of sedation and the patient wishes to proceed.    Lollie Sails, MD Gastroenterology 08/16/2017  8:22 AM

## 2017-08-16 NOTE — Anesthesia Postprocedure Evaluation (Signed)
Anesthesia Post Note  Patient: Jeremy [name]  Procedure(s) Performed: ESOPHAGOGASTRODUODENOSCOPY (EGD) WITH PROPOFOL (N/A ) COLONOSCOPY WITH PROPOFOL (N/A )  Patient location during evaluation: Endoscopy Anesthesia Type: General Level of consciousness: awake and alert Pain management: pain level controlled Vital Signs Assessment: post-procedure vital signs reviewed and stable Respiratory status: spontaneous breathing, nonlabored ventilation, respiratory function stable and patient connected to nasal cannula oxygen Cardiovascular status: blood pressure returned to baseline and stable Postop Assessment: no apparent nausea or vomiting Anesthetic complications: no     Last Vitals:  Vitals:   08/16/17 1000 08/16/17 1010  BP: 125/72 113/68  Pulse: (!) 50 (!) 50  Resp: 12 13  Temp:    SpO2: 99% 95%    Last Pain:  Vitals:   08/16/17 0930  TempSrc: Tympanic                 Martha Clan

## 2017-08-16 NOTE — Anesthesia Post-op Follow-up Note (Signed)
Anesthesia QCDR form completed.        

## 2017-08-16 NOTE — Transfer of Care (Signed)
Immediate Anesthesia Transfer of Care Note  Patient: Jeremy [name]  Procedure(s) Performed: ESOPHAGOGASTRODUODENOSCOPY (EGD) WITH PROPOFOL (N/A ) COLONOSCOPY WITH PROPOFOL (N/A )  Patient Location: PACU and Endoscopy Unit  Anesthesia Type:General  Level of Consciousness: drowsy  Airway & Oxygen Therapy: Patient Spontanous Breathing and Patient connected to nasal cannula oxygen  Post-op Assessment: Report given to RN and Post -op Vital signs reviewed and stable  Post vital signs: Reviewed and stable  Last Vitals:  Vitals:   08/16/17 0822 08/16/17 0930  BP: (!) 153/82 (!) 93/56  Pulse: 66 (!) 57  Resp: 17 14  Temp: (!) 36 C (!) 35.8 C  SpO2: 97% 97%    Last Pain:  Vitals:   08/16/17 0930  TempSrc: Tympanic         Complications: No apparent anesthesia complications

## 2017-08-17 ENCOUNTER — Encounter: Payer: Self-pay | Admitting: Gastroenterology

## 2017-08-17 LAB — SURGICAL PATHOLOGY

## 2017-08-18 DIAGNOSIS — R079 Chest pain, unspecified: Secondary | ICD-10-CM | POA: Diagnosis not present

## 2017-08-25 DIAGNOSIS — I10 Essential (primary) hypertension: Secondary | ICD-10-CM | POA: Diagnosis not present

## 2017-08-25 DIAGNOSIS — R079 Chest pain, unspecified: Secondary | ICD-10-CM | POA: Diagnosis not present

## 2017-08-25 DIAGNOSIS — R0602 Shortness of breath: Secondary | ICD-10-CM | POA: Diagnosis not present

## 2017-08-25 DIAGNOSIS — Z794 Long term (current) use of insulin: Secondary | ICD-10-CM | POA: Diagnosis not present

## 2017-08-25 DIAGNOSIS — E1159 Type 2 diabetes mellitus with other circulatory complications: Secondary | ICD-10-CM | POA: Diagnosis not present

## 2017-08-25 DIAGNOSIS — E119 Type 2 diabetes mellitus without complications: Secondary | ICD-10-CM | POA: Diagnosis not present

## 2017-08-30 ENCOUNTER — Other Ambulatory Visit: Payer: Self-pay

## 2017-08-30 MED ORDER — FINASTERIDE 5 MG PO TABS: 5.0000 mg | ORAL_TABLET | Freq: Every day | ORAL | 0 refills | Status: DC

## 2017-08-30 NOTE — Telephone Encounter (Signed)
Refill request for general medication. Proscar to [facility].   Last office visit: 07/12/2017  Follow up on 10/15/2017

## 2017-09-02 ENCOUNTER — Ambulatory Visit: Payer: Medicare Other

## 2017-09-06 ENCOUNTER — Other Ambulatory Visit: Payer: Self-pay

## 2017-09-06 DIAGNOSIS — E782 Mixed hyperlipidemia: Secondary | ICD-10-CM

## 2017-09-06 MED ORDER — ROSUVASTATIN CALCIUM 10 MG PO TABS: 10.0000 mg | ORAL_TABLET | Freq: Every day | ORAL | 0 refills | Status: DC

## 2017-09-06 NOTE — Telephone Encounter (Signed)
Refill Request for Cholesterol medication. Rosuvastatin to [facility].   Last visit: 07/12/2017   Lab Results  Component Value Date   CHOL 98 06/10/2017   HDL 43 06/10/2017   LDLCALC 32 06/10/2017   TRIG 152 (H) 06/10/2017   CHOLHDL 2.3 06/10/2017    Follow up on 11/15/2017

## 2017-09-13 ENCOUNTER — Other Ambulatory Visit: Payer: Self-pay

## 2017-09-13 DIAGNOSIS — I10 Essential (primary) hypertension: Secondary | ICD-10-CM

## 2017-09-13 MED ORDER — BISOPROLOL-HYDROCHLOROTHIAZIDE 10-6.25 MG PO TABS: 1.0000 | ORAL_TABLET | Freq: Every day | ORAL | 0 refills | Status: DC

## 2017-09-13 NOTE — Telephone Encounter (Signed)
Hypertension medication request: Bisoprolol/HCTZ to [facility] with a 90 day supply.  Last office visit pertaining to hypertension: 08/16/2017  BP Readings from Last 3 Encounters:  08/16/17 113/68  07/12/17 120/74  06/10/17 111/65    Lab Results  Component Value Date   CREATININE 0.67 (L) 07/07/2017   BUN 18 07/07/2017   NA 141 07/07/2017   K 3.6 07/07/2017   CL 102 07/07/2017   CO2 30 07/07/2017     Follow up on 11/15/2017

## 2017-10-13 DIAGNOSIS — H401122 Primary open-angle glaucoma, left eye, moderate stage: Secondary | ICD-10-CM | POA: Diagnosis not present

## 2017-10-13 DIAGNOSIS — E119 Type 2 diabetes mellitus without complications: Secondary | ICD-10-CM | POA: Diagnosis not present

## 2017-10-13 LAB — HM DIABETES EYE EXAM

## 2017-10-15 ENCOUNTER — Encounter: Payer: Self-pay | Admitting: Family Medicine

## 2017-10-15 ENCOUNTER — Ambulatory Visit: Payer: Medicare Other | Admitting: Family Medicine

## 2017-10-18 ENCOUNTER — Other Ambulatory Visit: Payer: Self-pay

## 2017-10-18 DIAGNOSIS — I10 Essential (primary) hypertension: Secondary | ICD-10-CM

## 2017-10-18 MED ORDER — TELMISARTAN 80 MG PO TABS: 80.0000 mg | ORAL_TABLET | Freq: Every day | ORAL | 0 refills | Status: DC

## 2017-10-18 NOTE — Telephone Encounter (Signed)
Refill request for Hypertension medication:  Telmisartan 80 mg  Last office visit pertaining to hypertension: 07/12/2017  BP Readings from Last 3 Encounters:  08/16/17 113/68  07/12/17 120/74  06/10/17 111/65     Lab Results  Component Value Date   CREATININE 0.67 (L) 07/07/2017   BUN 18 07/07/2017   NA 141 07/07/2017   K 3.6 07/07/2017   CL 102 07/07/2017   CO2 30 07/07/2017     Follow-ups on file. 11/15/2017

## 2017-10-22 ENCOUNTER — Telehealth: Payer: Self-pay | Admitting: Family Medicine

## 2017-10-22 NOTE — Telephone Encounter (Signed)
Called patient to inform him that he was given enough medication to last him until his appointment in June. Afterwards it would be changed to 90 days with 1 refill.

## 2017-10-22 NOTE — Telephone Encounter (Signed)
Copied from [place] [id number]. Topic: Quick Communication - Rx Refill/Question >> Oct 22, 2017 10:53 AM Oliver Pila B wrote: Medication: telmisartan (MICARDIS) 80 MG tablet [[id number]]   Pt called about the medication above; and does not understand why the medication was changed from 90 days to 30 days, call pt to advise

## 2017-11-10 DIAGNOSIS — E1142 Type 2 diabetes mellitus with diabetic polyneuropathy: Secondary | ICD-10-CM | POA: Diagnosis not present

## 2017-11-10 DIAGNOSIS — B351 Tinea unguium: Secondary | ICD-10-CM | POA: Diagnosis not present

## 2017-11-15 ENCOUNTER — Ambulatory Visit (INDEPENDENT_AMBULATORY_CARE_PROVIDER_SITE_OTHER): Payer: Medicare Other | Admitting: Family Medicine

## 2017-11-15 ENCOUNTER — Encounter: Payer: Self-pay | Admitting: Family Medicine

## 2017-11-15 VITALS — BP 108/64 | HR 64 | Temp 98.5°F | Resp 16 | Ht 76.0 in | Wt 242.5 lb

## 2017-11-15 DIAGNOSIS — IMO0002 Reserved for concepts with insufficient information to code with codable children: Secondary | ICD-10-CM

## 2017-11-15 DIAGNOSIS — I8500 Esophageal varices without bleeding: Secondary | ICD-10-CM | POA: Insufficient documentation

## 2017-11-15 DIAGNOSIS — E782 Mixed hyperlipidemia: Secondary | ICD-10-CM

## 2017-11-15 DIAGNOSIS — Z8719 Personal history of other diseases of the digestive system: Secondary | ICD-10-CM

## 2017-11-15 DIAGNOSIS — R809 Proteinuria, unspecified: Secondary | ICD-10-CM

## 2017-11-15 DIAGNOSIS — N400 Enlarged prostate without lower urinary tract symptoms: Secondary | ICD-10-CM | POA: Diagnosis not present

## 2017-11-15 DIAGNOSIS — E1129 Type 2 diabetes mellitus with other diabetic kidney complication: Secondary | ICD-10-CM

## 2017-11-15 DIAGNOSIS — E1140 Type 2 diabetes mellitus with diabetic neuropathy, unspecified: Secondary | ICD-10-CM | POA: Diagnosis not present

## 2017-11-15 DIAGNOSIS — I10 Essential (primary) hypertension: Secondary | ICD-10-CM | POA: Diagnosis not present

## 2017-11-15 DIAGNOSIS — E1159 Type 2 diabetes mellitus with other circulatory complications: Secondary | ICD-10-CM | POA: Diagnosis not present

## 2017-11-15 DIAGNOSIS — R600 Localized edema: Secondary | ICD-10-CM

## 2017-11-15 DIAGNOSIS — Z794 Long term (current) use of insulin: Secondary | ICD-10-CM | POA: Diagnosis not present

## 2017-11-15 DIAGNOSIS — E1165 Type 2 diabetes mellitus with hyperglycemia: Secondary | ICD-10-CM | POA: Diagnosis not present

## 2017-11-15 DIAGNOSIS — I152 Hypertension secondary to endocrine disorders: Secondary | ICD-10-CM

## 2017-11-15 HISTORY — DX: Personal history of other diseases of the digestive system: Z87.19

## 2017-11-15 MED ORDER — FINASTERIDE 5 MG PO TABS: 5.0000 mg | ORAL_TABLET | Freq: Every day | ORAL | 1 refills | Status: DC

## 2017-11-15 MED ORDER — ROSUVASTATIN CALCIUM 10 MG PO TABS: 10.0000 mg | ORAL_TABLET | Freq: Every day | ORAL | 1 refills | Status: DC

## 2017-11-15 MED ORDER — TELMISARTAN-HCTZ 80-25 MG PO TABS: 1.0000 | ORAL_TABLET | Freq: Every day | ORAL | 1 refills | Status: DC

## 2017-11-15 MED ORDER — BISOPROLOL FUMARATE 10 MG PO TABS: 10.0000 mg | ORAL_TABLET | Freq: Every day | ORAL | 1 refills | Status: DC

## 2017-11-15 NOTE — Progress Notes (Signed)
Name: Jeremy [name]   MRN: [id number]    DOB: Jun 30, 1935   Date:11/15/2017       Progress Note  Subjective  Chief Complaint  Chief Complaint  Patient presents with  . Medication Refill  . Hypertension    Would like a 90 day prescription-Denies any symptoms  . Hyperlipidemia  . Diabetes    Seeing Toni Arthurs, Endocrinologist  . Allergic Rhinitis     Takes Claritin as needed-pollen has been bad and gets red eyes when working in the yard    HPI  DMII: he was diagnosed in the 90's during a hospital stay for small bowel obstruction. He was on oral medications for a long time, started on insulin a couple of years ago. Currently seeing Endocrinologist. He denies polyphagia, polyuria or polydipsia. He is compliant, up to date with eye exam, foot exam and urine micro. No side effects of medications. He has neuropathy but doing well without medication. HTN is towards low end of normal and history of microalbuminuria.   HTN: he denies chest pain or palpitation. Had a stress test done recently because of one episodes of chest pain and it was negative, not on aspirin because of history of esophagitis and previous diverticulitis with bleeding  GERD with esophagitis, sees gastroenterologist and is back on Nexium and takes ranitidine as needed, history of esophagitis and also has esophageal varices.   Hyperlipidemia: no myalgia, reviewed last labs and LDL was at goal    Patient Active Problem List   Diagnosis Date Noted  . History of diverticulitis of colon 11/15/2017  . Esophageal varices without bleeding ([place]) 11/15/2017  . Hyperlipidemia due to type 2 diabetes mellitus ([place]) 03/14/2017  . Bilateral lower extremity edema 03/27/2016  . Vitamin D deficiency 12/05/2015  . Incomplete emptying of bladder 10/11/2015  . Hx of transient ischemic attack (TIA) 09/17/2015  . Transient vision disturbance of both eyes 08/28/2015  . Stroke-like symptoms 08/28/2015  . Diabetes mellitus type 2  without retinopathy ([place]) 05/31/2015  . Hyperlipidemia 05/30/2015  . Abnormal CT of the chest 05/15/2015  . Abnormal abdominal CT scan 05/15/2015  . Chronic kidney disease (CKD), stage I 01/08/2015  . Type II diabetes mellitus with renal manifestations, uncontrolled ([place]) 01/08/2015  . Type 2 diabetes, uncontrolled, with neuropathy ([place]) 01/08/2015  . Obesity 01/08/2015  . Proteinuria 01/08/2015  . Atrophy of vocal cord 02/07/2014  . Incisional hernia, without obstruction or gangrene 03/23/2013  . Enlarged prostate with lower urinary tract symptoms (LUTS) 11/03/2012  . Myopic degeneration, bilateral 08/12/2012  . Nonexudative age-related macular degeneration 07/15/2012  . Anisometropia 04/08/2012  . Pseudoaphakia 04/08/2012  . History of retinal detachment 04/08/2012  . Presence of intraocular lens 04/08/2012  . Bladder diverticulum 02/12/2012  . Diverticulum of bladder 02/12/2012  . Dysphonia 02/08/2012  . Secondary open-angle glaucoma of left eye, severe stage 01/13/2012  . Osteopenia 03/18/2010  . Gastro-esophageal reflux disease with esophagitis 02/07/2008  . Benign essential HTN 02/03/2007  . Hypertension associated with diabetes ([place]) 02/03/2007    Past Surgical History:  Procedure Laterality Date  . APPENDECTOMY    . BLADDER DIVERTICULECTOMY  2013  . COLON SURGERY Left 1980's  . COLONOSCOPY W/ POLYPECTOMY  1990's   Dr Sharlet Salina  . COLONOSCOPY WITH PROPOFOL N/A 12/21/2014   Procedure: COLONOSCOPY WITH PROPOFOL;  Surgeon: Lollie Sails, MD;  Location: [facility] ENDOSCOPY;  Service: Endoscopy;  Laterality: N/A;  . COLONOSCOPY WITH PROPOFOL N/A 08/16/2017   Procedure: COLONOSCOPY WITH PROPOFOL;  Surgeon: Gustavo Lah,  Billie Ruddy, MD;  Location: [facility] ENDOSCOPY;  Service: Endoscopy;  Laterality: N/A;  . ESOPHAGOGASTRODUODENOSCOPY (EGD) WITH PROPOFOL N/A 02/15/2015   Procedure: ESOPHAGOGASTRODUODENOSCOPY (EGD) WITH PROPOFOL;  Surgeon: Lollie Sails, MD;  Location: [facility] ENDOSCOPY;   Service: Endoscopy;  Laterality: N/A;  . ESOPHAGOGASTRODUODENOSCOPY (EGD) WITH PROPOFOL N/A 06/10/2017   Procedure: ESOPHAGOGASTRODUODENOSCOPY (EGD) WITH PROPOFOL;  Surgeon: Lollie Sails, MD;  Location: [facility] ENDOSCOPY;  Service: Endoscopy;  Laterality: N/A;  . ESOPHAGOGASTRODUODENOSCOPY (EGD) WITH PROPOFOL N/A 08/16/2017   Procedure: ESOPHAGOGASTRODUODENOSCOPY (EGD) WITH PROPOFOL;  Surgeon: Lollie Sails, MD;  Location: [facility] ENDOSCOPY;  Service: Endoscopy;  Laterality: N/A;  . EYE SURGERY    . GLAUCOMA SURGERY Left [place]     [facility], Dr Sharlet Salina  . INGUINAL HERNIA REPAIR Bilateral 1960's  . LARYNX SURGERY  1990   3 times  . RETINAL DETACHMENT SURGERY Right   . TONSILLECTOMY    . URETER SURGERY  2013   Dr Jacqlyn Larsen    Family History  Problem Relation Age of Onset  . Diabetes Mother   . Cataracts Mother   . Metabolic syndrome Mother   . Cataracts Father   . Glaucoma Father        Retina-Detachment  . Diverticulitis Sister   . Glaucoma Brother   . Diabetes Brother   . Cancer Sister        Gallbladder  . Cancer Daughter     Social History   Socioeconomic History  . Marital status: Married    Spouse name: Pamala Hurry   . Number of children: 3  . Years of education: Not on file  . Highest education level: Professional school degree (e.g., MD, DDS, DVM, JD)  Occupational History  . Occupation: Industrial/product designer     Comment: still working - Engineer, maintenance as a Engineer, maintenance  . Financial resource strain: Not on file  . Food insecurity:    Worry: Not on file    Inability: Not on file  . Transportation needs:    Medical: Not on file    Non-medical: Not on file  Tobacco Use  . Smoking status: Never Smoker  . Smokeless tobacco: Never Used  Substance and Sexual Activity  . Alcohol use: No    Frequency: Never  . Drug use: No  . Sexual activity: Not Currently  Lifestyle  . Physical activity:     Days per week: Not on file    Minutes per session: Not on file  . Stress: Not on file  Relationships  . Social connections:    Talks on phone: Not on file    Gets together: Not on file    Attends religious service: Not on file    Active member of club or organization: Not on file    Attends meetings of clubs or organizations: Not on file    Relationship status: Not on file  . Intimate partner violence:    Fear of current or ex partner: Not on file    Emotionally abused: Not on file    Physically abused: Not on file    Forced sexual activity: Not on file  Other Topics Concern  . Not on file  Social History Narrative   Married , still works as a Engineer, drilling Environmental consultant)   Three children ( one died in his 60's with colon cancer)      Current Outpatient Medications:  .  brimonidine (ALPHAGAN P) 0.1 % SOLN, Place 1 drop into the left eye 2 (two) times daily. , Disp: , Rfl:  .  BYDUREON 2 MG PEN, INJECT 2 MG SUBCUTANEOUSLY ONCE A WEEK., Disp: 4 each, Rfl: 2 .  dorzolamide (TRUSOPT) 2 % ophthalmic solution, Place 1 drop into the left eye 2 (two) times daily., Disp: , Rfl: 3 .  esomeprazole (NEXIUM) 20 MG capsule, Take 20 mg by mouth daily., Disp: , Rfl:  .  finasteride (PROSCAR) 5 MG tablet, Take 1 tablet (5 mg total) by mouth daily., Disp: 90 tablet, Rfl: 1 .  hydrochlorothiazide (HYDRODIURIL) 25 MG tablet, Take 1 tablet (25 mg total) by mouth daily., Disp: 90 tablet, Rfl: 0 .  INVOKANA 300 MG TABS tablet, TAKE ONE TABLET BY MOUTH ONCE DAILY, Disp: 90 tablet, Rfl: 0 .  loratadine (CLARITIN) 10 MG tablet, Take 10 mg by mouth daily., Disp: , Rfl:  .  meloxicam (MOBIC) 15 MG tablet, Take 15 mg by mouth daily as needed., Disp: , Rfl:  .  metFORMIN (GLUCOPHAGE) 1000 MG tablet, Take 1 tablet (1,000 mg total) by mouth 2 (two) times daily., Disp: 180 tablet, Rfl: 1 .  ranitidine (ZANTAC) 150 MG tablet, Take 150 mg by mouth daily as needed., Disp: , Rfl:  .  rosuvastatin (CRESTOR) 10 MG  tablet, Take 1 tablet (10 mg total) by mouth at bedtime., Disp: 90 tablet, Rfl: 1 .  bisoprolol (ZEBETA) 10 MG tablet, Take 1 tablet (10 mg total) by mouth daily., Disp: 90 tablet, Rfl: 1 .  Insulin Glargine (LANTUS SOLOSTAR) 100 UNIT/ML Solostar Pen, Inject 90 Units into the skin daily., Disp: , Rfl:  .  telmisartan-hydrochlorothiazide (MICARDIS HCT) 80-25 MG tablet, Take 1 tablet by mouth daily., Disp: 90 tablet, Rfl: 1 .  timolol (TIMOPTIC-XR) 0.25 % ophthalmic gel-forming, Place 1 drop into the left eye 2 (two) times daily. , Disp: , Rfl:   Allergies  Allergen Reactions  . Demerol [Meperidine] Other (See Comments) and Anaphylaxis    MAKES BLOOD PRESSURE BOTTOM OUT hypotension  . Sulfa Antibiotics Other (See Comments)    MAKES BLOOD PRESSURE BOTTOM OUT  . Dilaudid [Hydromorphone Hcl]   . Hydromorphone     Other reaction(s): Unknown MAKES BLOOD PRESSURE BOTTOM OUT  . Actos [Pioglitazone] Other (See Comments) and Hives  . Dapagliflozin Rash  . Penicillins Rash     ROS  Constitutional: Negative for fever or weight change.  Respiratory: Negative for cough and shortness of breath.   Cardiovascular: Negative for chest pain or palpitations.  Gastrointestinal: Negative for abdominal pain, no bowel changes.  Musculoskeletal: Negative for gait problem or joint swelling.  Skin: Negative for rash.  Neurological: Negative for dizziness or headache.  No other specific complaints in a complete review of systems (except as listed in HPI above).   Objective  Vitals:   11/15/17 1351  BP: 108/64  Pulse: 64  Resp: 16  Temp: 98.5 F (36.9 C)  TempSrc: Oral  SpO2: 97%  Weight: 242 lb 8 oz (110 kg)  Height: 6\' 4"  (1.93 m)    Body mass index is 29.52 kg/m.  Physical Exam  Constitutional: Patient appears well-developed and well-nourished. Obese No distress.  HEENT: head atraumatic, normocephalic, pupils equal and reactive to light,  neck supple, throat within normal  limits Cardiovascular: Normal rate, regular rhythm and normal heart sounds.  No murmur heard. Trace  BLE edema. Pulmonary/Chest: Effort normal and breath sounds normal. No respiratory distress. Abdominal: Soft.  There is no  tenderness. Psychiatric: Patient has a normal mood and affect. behavior is normal. Judgment and thought content normal.  Recent Results (from the past 2160 hour(s))  HM DIABETES EYE EXAM     Status: None   Collection Time: 10/13/17 12:00 AM  Result Value Ref Range   HM Diabetic Eye Exam No Retinopathy No Retinopathy    Comment: [facility], Dr. Eithen Ina     PHQ2/9: Depression screen [facility] 2/9 11/15/2017 07/12/2017 04/08/2017 02/22/2017 01/04/2017  Decreased Interest 0 0 0 0 0  Down, Depressed, Hopeless 0 0 0 0 0  PHQ - 2 Score 0 0 0 0 0     Fall Risk: Fall Risk  11/15/2017 07/12/2017 04/08/2017 02/22/2017 01/04/2017  Falls in the past year? No No No No No     Functional Status Survey: Is the patient deaf or have difficulty hearing?: No Does the patient have difficulty seeing, even when wearing glasses/contacts?: Yes(prescription glasses) Does the patient have difficulty concentrating, remembering, or making decisions?: No Does the patient have difficulty walking or climbing stairs?: No Does the patient have difficulty dressing or bathing?: No Does the patient have difficulty doing errands alone such as visiting a doctor's office or shopping?: No    Assessment & Plan  1. Type 2 diabetes, uncontrolled, with neuropathy (HCC)  Symptoms are mild, not on medication.  2. Esophageal varices without bleeding, unspecified esophageal varices type (HCC)  Sees Dr. Gustavo Lah  3. Benign essential HTN  We will stop Ziac and switch to Bisoprolol and combine telmisartan/hctz. We will monitor bp, denies orthostatic changes and wants to continue same dose - bisoprolol (ZEBETA) 10 MG tablet; Take 1 tablet (10 mg total) by mouth daily.  Dispense: 90 tablet; Refill: 1 -  telmisartan-hydrochlorothiazide (MICARDIS HCT) 80-25 MG tablet; Take 1 tablet by mouth daily.  Dispense: 90 tablet; Refill: 1  4. Bilateral lower extremity edema  Mild on legs, better with HCTZ  5. Mixed hyperlipidemia  Following and LDL is at goal  - rosuvastatin (CRESTOR) 10 MG tablet; Take 1 tablet (10 mg total) by mouth at bedtime.  Dispense: 90 tablet; Refill: 1  6. Hypertension associated with diabetes ([place])  - telmisartan-hydrochlorothiazide (MICARDIS HCT) 80-25 MG tablet; Take 1 tablet by mouth daily.  Dispense: 90 tablet; Refill: 1  7. Type 2 diabetes mellitus with microalbuminuria, with long-term current use of insulin (HCC)  - telmisartan-hydrochlorothiazide (MICARDIS HCT) 80-25 MG tablet; Take 1 tablet by mouth daily.  Dispense: 90 tablet; Refill: 1  8. Benign prostatic hyperplasia without lower urinary tract symptoms  Released from Dr. Jacqlyn Larsen

## 2017-11-17 DIAGNOSIS — E785 Hyperlipidemia, unspecified: Secondary | ICD-10-CM | POA: Diagnosis not present

## 2017-11-17 DIAGNOSIS — I10 Essential (primary) hypertension: Secondary | ICD-10-CM | POA: Diagnosis not present

## 2017-11-17 DIAGNOSIS — E119 Type 2 diabetes mellitus without complications: Secondary | ICD-10-CM | POA: Diagnosis not present

## 2017-11-17 DIAGNOSIS — E1159 Type 2 diabetes mellitus with other circulatory complications: Secondary | ICD-10-CM | POA: Diagnosis not present

## 2017-11-17 DIAGNOSIS — E1169 Type 2 diabetes mellitus with other specified complication: Secondary | ICD-10-CM | POA: Diagnosis not present

## 2017-11-17 DIAGNOSIS — Z794 Long term (current) use of insulin: Secondary | ICD-10-CM | POA: Diagnosis not present

## 2017-11-17 LAB — HEMOGLOBIN A1C: Hemoglobin A1C: 7

## 2017-11-29 ENCOUNTER — Other Ambulatory Visit: Payer: Self-pay | Admitting: Family Medicine

## 2017-11-29 DIAGNOSIS — I10 Essential (primary) hypertension: Secondary | ICD-10-CM

## 2018-02-16 DIAGNOSIS — B351 Tinea unguium: Secondary | ICD-10-CM | POA: Diagnosis not present

## 2018-02-16 DIAGNOSIS — E1142 Type 2 diabetes mellitus with diabetic polyneuropathy: Secondary | ICD-10-CM | POA: Diagnosis not present

## 2018-02-23 ENCOUNTER — Encounter: Payer: Self-pay | Admitting: Family Medicine

## 2018-02-25 ENCOUNTER — Ambulatory Visit (INDEPENDENT_AMBULATORY_CARE_PROVIDER_SITE_OTHER): Payer: Medicare Other

## 2018-02-25 VITALS — BP 120/68 | HR 55 | Temp 97.7°F | Resp 12 | Ht 76.0 in | Wt 239.0 lb

## 2018-02-25 DIAGNOSIS — Z0000 Encounter for general adult medical examination without abnormal findings: Secondary | ICD-10-CM | POA: Diagnosis not present

## 2018-02-25 DIAGNOSIS — Z23 Encounter for immunization: Secondary | ICD-10-CM

## 2018-02-25 NOTE — Progress Notes (Signed)
Subjective:   Jeremy [name] is a 82 y.o. male who presents for Medicare Annual/Subsequent preventive examination.  Review of Systems:  N/A Cardiac Risk Factors include: diabetes mellitus;advanced age (>50men, >96 women);hypertension;male gender;dyslipidemia;sedentary lifestyle     Objective:    Vitals: BP 120/68 (BP Location: Left Arm, Patient Position: Sitting, Cuff Size: Normal)   Pulse (!) 55   Temp 97.7 F (36.5 C) (Oral)   Resp 12   Ht 6\' 4"  (1.93 m)   Wt 239 lb (108.4 kg)   SpO2 94%   BMI 29.09 kg/m   Body mass index is 29.09 kg/m.  Advanced Directives 02/25/2018 08/16/2017 04/08/2017 04/08/2017 02/22/2017 01/04/2017 09/21/2016  Does Patient Have a Medical Advance Directive? Yes Yes Yes Yes Yes No No  Type of Paramedic of [place];Living will [place];Living will [place];Living will [place];Living will [place];Living will - -  Does patient want to make changes to medical advance directive? - - - - - - -  Copy of [place] in Chart? Yes No - copy requested No - copy requested No - copy requested No - copy requested - -  Would patient like information on creating a medical advance directive? - - - - - - -    Tobacco Social History   Tobacco Use  Smoking Status Never Smoker  Smokeless Tobacco Never Used  Tobacco Comment   smoking cessation materials not required     Counseling given: No Comment: smoking cessation materials not required  Clinical Intake:  Pre-visit preparation completed: Yes  Pain : No/denies pain   BMI - recorded: 29.09 Nutritional Status: BMI 25 -29 Overweight  Nutrition Risk Assessment: Has the patient had any N/V/D within the last 2 months?  No Does the patient have any non-healing wounds?  No Has the patient had any unintentional weight loss or weight gain?  No  Is the patient diabetic?  Yes If diabetic, was a  CBG obtained today?  No Did the patient bring in their glucometer from home?  No Comments: Pt monitors CBG's daily. Denies any financial strains with the device or supplies.  Diabetic Exams: Diabetic Eye Exam: Completed 10/13/17.  Diabetic Foot Exam: Completed 11/10/17.   How often do you need to have someone help you when you read instructions, pamphlets, or other written materials from your doctor or pharmacy?: 1 - Never  Interpreter Needed?: No  Information entered by :: Idell Pickles, LPN  Past Medical History:  Diagnosis Date  . Chronic kidney disease   . Diabetes mellitus without complication ([place])   . Diabetic neuropathy ([place])   . Diverticula, colon 1980's  . Diverticulosis   . Enlarged prostate   . GERD (gastroesophageal reflux disease)   . Glaucoma   . Hemorrhoids   . High cholesterol   . History of hiatal hernia   . Hyperlipidemia   . Hypertension   . Myocardial infarction ([place])   . Obesity   . Onychomycosis   . Seizures ([place])   . Small bowel obstruction ([place]) 1990's  . Urination disorder    Past Surgical History:  Procedure Laterality Date  . APPENDECTOMY    . BLADDER DIVERTICULECTOMY  2013  . COLON SURGERY Left 1980's  . COLONOSCOPY W/ POLYPECTOMY  1990's   Dr Sharlet Salina  . COLONOSCOPY WITH PROPOFOL N/A 12/21/2014   Procedure: COLONOSCOPY WITH PROPOFOL;  Surgeon: Lollie Sails, MD;  Location: [facility] ENDOSCOPY;  Service: Endoscopy;  Laterality: N/A;  . COLONOSCOPY WITH PROPOFOL N/A 08/16/2017   Procedure: COLONOSCOPY WITH PROPOFOL;  Surgeon: Lollie Sails, MD;  Location: [facility] ENDOSCOPY;  Service: Endoscopy;  Laterality: N/A;  . ESOPHAGOGASTRODUODENOSCOPY (EGD) WITH PROPOFOL N/A 02/15/2015   Procedure: ESOPHAGOGASTRODUODENOSCOPY (EGD) WITH PROPOFOL;  Surgeon: Lollie Sails, MD;  Location: [facility] ENDOSCOPY;  Service: Endoscopy;  Laterality: N/A;  . ESOPHAGOGASTRODUODENOSCOPY (EGD) WITH PROPOFOL N/A 06/10/2017   Procedure: ESOPHAGOGASTRODUODENOSCOPY (EGD) WITH  PROPOFOL;  Surgeon: Lollie Sails, MD;  Location: [facility] ENDOSCOPY;  Service: Endoscopy;  Laterality: N/A;  . ESOPHAGOGASTRODUODENOSCOPY (EGD) WITH PROPOFOL N/A 08/16/2017   Procedure: ESOPHAGOGASTRODUODENOSCOPY (EGD) WITH PROPOFOL;  Surgeon: Lollie Sails, MD;  Location: [facility] ENDOSCOPY;  Service: Endoscopy;  Laterality: N/A;  . EYE SURGERY    . GLAUCOMA SURGERY Left [place]     [place], Dr Sharlet Salina  . INGUINAL HERNIA REPAIR Bilateral 1960's  . LARYNX SURGERY  1990   3 times  . RETINAL DETACHMENT SURGERY Right   . TONSILLECTOMY    . URETER SURGERY  2013   Dr Jacqlyn Larsen   Family History  Problem Relation Age of Onset  . Diabetes Mother   . Cataracts Mother   . Metabolic syndrome Mother   . Blindness Mother        r/t diabetes  . Cataracts Father   . Glaucoma Father        Retina-Detachment  . Atrial fibrillation Father   . Diverticulitis Sister   . Glaucoma Brother   . Diabetes Brother   . Cancer Sister        Gallbladder  . Cancer Daughter    Social History   Socioeconomic History  . Marital status: Married    Spouse name: Pamala Hurry   . Number of children: 3  . Years of education: Not on file  . Highest education level: Professional school degree (e.g., MD, DDS, DVM, JD)  Occupational History  . Occupation: Industrial/product designer     Comment: still working - Engineer, maintenance as a Engineer, maintenance  . Financial resource strain: Not hard at all  . Food insecurity:    Worry: Never true    Inability: Never true  . Transportation needs:    Medical: No    Non-medical: No  Tobacco Use  . Smoking status: Never Smoker  . Smokeless tobacco: Never Used  . Tobacco comment: smoking cessation materials not required  Substance and Sexual Activity  . Alcohol use: No    Frequency: Never  . Drug use: No  . Sexual activity: Not Currently  Lifestyle  . Physical activity:    Days per week: 0 days    Minutes per  session: 0 min  . Stress: Not at all  Relationships  . Social connections:    Talks on phone: Patient refused    Gets together: Patient refused    Attends religious service: Patient refused    Active member of club or organization: Patient refused    Attends meetings of clubs or organizations: Patient refused    Relationship status: Married  Other Topics Concern  . Not on file  Social History Narrative   Married , still works as a Engineer, drilling Environmental consultant)   Three children ( one died in his 43's with colon cancer)     Outpatient Encounter Medications as of 02/25/2018  Medication Sig  . bisoprolol (ZEBETA) 10 MG tablet Take 1 tablet (  10 mg total) by mouth daily.  . brimonidine (ALPHAGAN P) 0.1 % SOLN Place 1 drop into the left eye 2 (two) times daily.   Marland Kitchen BYDUREON 2 MG PEN INJECT 2 MG SUBCUTANEOUSLY ONCE A WEEK.  . dorzolamide (TRUSOPT) 2 % ophthalmic solution Place 1 drop into the left eye 2 (two) times daily.  Marland Kitchen esomeprazole (NEXIUM) 20 MG capsule Take 20 mg by mouth daily.  . finasteride (PROSCAR) 5 MG tablet Take 1 tablet (5 mg total) by mouth daily.  . INVOKANA 300 MG TABS tablet TAKE ONE TABLET BY MOUTH ONCE DAILY  . loratadine (CLARITIN) 10 MG tablet Take 10 mg by mouth daily.  . meloxicam (MOBIC) 15 MG tablet Take 15 mg by mouth daily as needed.  . metFORMIN (GLUCOPHAGE) 1000 MG tablet Take 1 tablet (1,000 mg total) by mouth 2 (two) times daily.  . rosuvastatin (CRESTOR) 10 MG tablet Take 1 tablet (10 mg total) by mouth at bedtime.  Marland Kitchen telmisartan-hydrochlorothiazide (MICARDIS HCT) 80-25 MG tablet Take 1 tablet by mouth daily.  . timolol (TIMOPTIC-XR) 0.25 % ophthalmic gel-forming Place 1 drop into the left eye 2 (two) times daily.   . Insulin Glargine (LANTUS SOLOSTAR) 100 UNIT/ML Solostar Pen Inject 90 Units into the skin daily.  . ranitidine (ZANTAC) 150 MG tablet Take 150 mg by mouth daily as needed.   No facility-administered encounter medications on file as of 02/25/2018.      Activities of Daily Living In your present state of health, do you have any difficulty performing the following activities: 02/25/2018 11/15/2017  Hearing? N N  Comment denies hearing aids -  Vision? N Y  Comment wears eyeglasses; glaucoma prescription glasses  Difficulty concentrating or making decisions? N N  Walking or climbing stairs? N N  Dressing or bathing? N N  Doing errands, shopping? N N  Preparing Food and eating ? N -  Comment denies dentures -  Using the Toilet? N -  In the past six months, have you accidently leaked urine? N -  Do you have problems with loss of bowel control? N -  Managing your Medications? N -  Managing your Finances? N -  Housekeeping or managing your Housekeeping? N -  Some recent data might be hidden    Patient Care Team: Steele Sizer, MD as PCP - General (Family Medicine) Requested, Self Birder Robson, MD as Consulting Physician (Ophthalmology) Earnestine Leys, MD as Consulting Physician (Orthopedic Surgery) Troxler, Rodman Key, DPM as Consulting Physician (Podiatry) Lollie Sails, MD as Consulting Physician (Gastroenterology) Isaias Cowman, MD as Consulting Physician (Cardiology) Lonia Farber, MD as Consulting Physician (Internal Medicine)   Assessment:   This is a routine wellness examination for Adonis.  Exercise Activities and Dietary recommendations Current Exercise Habits: The patient does not participate in regular exercise at present, Exercise limited by: None identified  Goals    . Increase water intake     Recommend increasing water intake to 6 glasses of water a day.     . Prevent falls     Recommend to remove any items from the home that may cause slips or trips.       Fall Risk Fall Risk  02/25/2018 11/15/2017 07/12/2017 04/08/2017 02/22/2017  Falls in the past year? No No No No No  Risk for fall due to : Impaired vision - - - -  Risk for fall due to: Comment wears eyeglasses - - - -   FALL RISK  PREVENTION PERTAINING TO HOME: Is your  home free of loose throw rugs in walkways, pet beds, electrical cords, etc? Yes Is there adequate lighting in your home to reduce risk of falls?  Yes Are there stairs in or around your home WITH handrails? Yes  ASSISTIVE DEVICES UTILIZED TO PREVENT FALLS: Use of a cane, walker or w/c? No Grab bars in the bathroom? Yes  Shower chair or a place to sit while bathing? No An elevated toilet seat or a handicapped toilet? Yes  Timed Get Up and Go Performed: Yes. Pt ambulated 10 feet within 18 sec. Gait slow, steady and without the use of an assistive device. No intervention required at this time. Fall risk prevention has been discussed.  Community Resource Referral:  Pt declined my offer to send [employer] Referral to Care Guide for a shower chair.  Depression Screen PHQ 2/9 Scores 02/25/2018 11/15/2017 07/12/2017 04/08/2017  PHQ - 2 Score 0 0 0 0  PHQ- 9 Score 0 - - -    Cognitive Function     6CIT Screen 02/25/2018  What Year? 0 points  What month? 0 points  What time? 0 points  Count back from 20 0 points  Months in reverse 0 points  Repeat phrase 4 points  Total Score 4    Immunization History  Administered Date(s) Administered  . Influenza, High Dose Seasonal PF 03/12/2016, 02/22/2017, 02/25/2018  . Influenza,inj,Quad PF,6+ Mos 03/28/2015  . Pneumococcal Conjugate-13 08/07/2013, 09/04/2013  . Pneumococcal Polysaccharide-23 04/04/2012  . Tdap 04/04/2012  . Zoster 09/17/2010  . Zoster Recombinat (Shingrix) 12/14/2017, 02/21/2018    Qualifies for Shingles Vaccine? No. Completed series.   Screening Tests Health Maintenance  Topic Date Due  . HEMOGLOBIN A1C  01/11/2018  . OPHTHALMOLOGY EXAM  10/14/2018  . FOOT EXAM  11/11/2018  . TETANUS/TDAP  04/04/2022  . INFLUENZA VACCINE  Completed  . PNA vac Low Risk Adult  Completed   Cancer Screenings: Lung: Low Dose CT Chest recommended if Age 83-80 years, 30 pack-year currently  smoking OR have quit w/in 15years. Patient does not qualify. Colorectal: No longer required  Additional Screenings: Hepatitis C Screening: Does not qualify     Plan:  I have personally reviewed and addressed the Medicare Annual Wellness questionnaire and have noted the following in the patient's chart:  A. Medical and social history B. Use of alcohol, tobacco or illicit drugs  C. Current medications and supplements D. Functional ability and status E.  Nutritional status F.  Physical activity G. Advance directives H. List of other physicians I.  Hospitalizations, surgeries, and ER visits in previous 12 months J.  [place] such as hearing and vision if needed, cognitive and depression L. Referrals and appointments  In addition, I have reviewed and discussed with patient certain preventive protocols, quality metrics, and best practice recommendations. A written personalized care plan for preventive services as well as general preventive health recommendations were provided to patient.  See attached scanned questionnaire for additional information.   Signed,  Aleatha Borer, LPN Nurse Health Advisor

## 2018-02-25 NOTE — Patient Instructions (Addendum)
[name] , Thank you for taking time to come for your Medicare Wellness Visit. I appreciate your ongoing commitment to your health goals. Please review the following plan we discussed and let me know if I can assist you in the future.   Screening recommendations/referrals: Colorectal Screening: No longer required  Vision and Dental Exams: Recommended annual ophthalmology exams for early detection of glaucoma and other disorders of the eye Recommended annual dental exams for proper oral hygiene  Diabetic Exams: Diabetic Eye Exam: Up to date Diabetic Foot Exam: Up to date  Vaccinations: Influenza vaccine: Completed today Pneumococcal vaccine: Up to date Tdap vaccine: Up to date Shingles vaccine: Up to date  Advanced directives: We have received a copy of your POA (Power of [place]) and/or Living Will. These documents can be located in your chart.  Goals: Recommend to remove any items from the home that may cause slips or trips.  Next appointment: Please schedule your Annual Wellness Visit with your Nurse Health Advisor in one year.  Preventive Care 55 Years and Older, Male Preventive care refers to lifestyle choices and visits with your health care provider that can promote health and wellness. What does preventive care include?  A yearly physical exam. This is also called an annual well check.  Dental exams once or twice a year.  Routine eye exams. Ask your health care provider how often you should have your eyes checked.  Personal lifestyle choices, including:  Daily care of your teeth and gums.  Regular physical activity.  Eating a healthy diet.  Avoiding tobacco and drug use.  Limiting alcohol use.  Practicing safe sex.  Taking low doses of aspirin every day.  Taking vitamin and mineral supplements as recommended by your health care provider. What happens during an annual well check? The services and screenings done by your health care provider during your  annual well check will depend on your age, overall health, lifestyle risk factors, and family history of disease. Counseling  Your health care provider may ask you questions about your:  Alcohol use.  Tobacco use.  Drug use.  Emotional well-being.  Home and relationship well-being.  Sexual activity.  Eating habits.  History of falls.  Memory and ability to understand (cognition).  Work and work Statistician. Screening  You may have the following tests or measurements:  Height, weight, and BMI.  Blood pressure.  Lipid and cholesterol levels. These may be checked every 5 years, or more frequently if you are over 35 years old.  Skin check.  Lung cancer screening. You may have this screening every year starting at age 56 if you have a 30-pack-year history of smoking and currently smoke or have quit within the past 15 years.  Fecal occult blood test (FOBT) of the stool. You may have this test every year starting at age 95.  Flexible sigmoidoscopy or colonoscopy. You may have a sigmoidoscopy every 5 years or a colonoscopy every 10 years starting at age 73.  Prostate cancer screening. Recommendations will vary depending on your family history and other risks.  Hepatitis C blood test.  Hepatitis B blood test.  Sexually transmitted disease (STD) testing.  Diabetes screening. This is done by checking your blood sugar (glucose) after you have not eaten for a while (fasting). You may have this done every 1-3 years.  Abdominal aortic aneurysm (AAA) screening. You may need this if you are a current or former smoker.  Osteoporosis. You may be screened starting at age 69 if you  are at high risk. Talk with your health care provider about your test results, treatment options, and if necessary, the need for more tests. Vaccines  Your health care provider may recommend certain vaccines, such as:  Influenza vaccine. This is recommended every year.  Tetanus, diphtheria, and  acellular pertussis (Tdap, Td) vaccine. You may need a Td booster every 10 years.  Zoster vaccine. You may need this after age 71.  Pneumococcal 13-valent conjugate (PCV13) vaccine. One dose is recommended after age 66.  Pneumococcal polysaccharide (PPSV23) vaccine. One dose is recommended after age 69. Talk to your health care provider about which screenings and vaccines you need and how often you need them. This information is not intended to replace advice given to you by your health care provider. Make sure you discuss any questions you have with your health care provider. Document Released: 06/28/2015 Document Revised: 02/19/2016 Document Reviewed: 04/02/2015 Elsevier Interactive Patient Education  2017 [place] Prevention in the Home Falls can cause injuries. They can happen to people of all ages. There are many things you can do to make your home safe and to help prevent falls. What can I do on the outside of my home?  Regularly fix the edges of walkways and driveways and fix any cracks.  Remove anything that might make you trip as you walk through a door, such as a raised step or threshold.  Trim any bushes or trees on the path to your home.  Use bright outdoor lighting.  Clear any walking paths of anything that might make someone trip, such as rocks or tools.  Regularly check to see if handrails are loose or broken. Make sure that both sides of any steps have handrails.  Any raised decks and porches should have guardrails on the edges.  Have any leaves, snow, or ice cleared regularly.  Use sand or salt on walking paths during winter.  Clean up any spills in your garage right away. This includes oil or grease spills. What can I do in the bathroom?  Use night lights.  Install grab bars by the toilet and in the tub and shower. Do not use towel bars as grab bars.  Use non-skid mats or decals in the tub or shower.  If you need to sit down in the shower, use  a plastic, non-slip stool.  Keep the floor dry. Clean up any water that spills on the floor as soon as it happens.  Remove soap buildup in the tub or shower regularly.  Attach bath mats securely with double-sided non-slip rug tape.  Do not have throw rugs and other things on the floor that can make you trip. What can I do in the bedroom?  Use night lights.  Make sure that you have a light by your bed that is easy to reach.  Do not use any sheets or blankets that are too big for your bed. They should not hang down onto the floor.  Have a firm chair that has side arms. You can use this for support while you get dressed.  Do not have throw rugs and other things on the floor that can make you trip. What can I do in the kitchen?  Clean up any spills right away.  Avoid walking on wet floors.  Keep items that you use a lot in easy-to-reach places.  If you need to reach something above you, use a strong step stool that has a grab bar.  Keep electrical cords out  of the way.  Do not use floor polish or wax that makes floors slippery. If you must use wax, use non-skid floor wax.  Do not have throw rugs and other things on the floor that can make you trip. What can I do with my stairs?  Do not leave any items on the stairs.  Make sure that there are handrails on both sides of the stairs and use them. Fix handrails that are broken or loose. Make sure that handrails are as long as the stairways.  Check any carpeting to make sure that it is firmly attached to the stairs. Fix any carpet that is loose or worn.  Avoid having throw rugs at the top or bottom of the stairs. If you do have throw rugs, attach them to the floor with carpet tape.  Make sure that you have a light switch at the top of the stairs and the bottom of the stairs. If you do not have them, ask someone to add them for you. What else can I do to help prevent falls?  Wear shoes that:  Do not have high heels.  Have  rubber bottoms.  Are comfortable and fit you well.  Are closed at the toe. Do not wear sandals.  If you use a stepladder:  Make sure that it is fully opened. Do not climb a closed stepladder.  Make sure that both sides of the stepladder are locked into place.  Ask someone to hold it for you, if possible.  Clearly mark and make sure that you can see:  Any grab bars or handrails.  First and last steps.  Where the edge of each step is.  Use tools that help you move around (mobility aids) if they are needed. These include:  Canes.  Walkers.  Scooters.  Crutches.  Turn on the lights when you go into a dark area. Replace any light bulbs as soon as they burn out.  Set up your furniture so you have a clear path. Avoid moving your furniture around.  If any of your floors are uneven, fix them.  If there are any pets around you, be aware of where they are.  Review your medicines with your doctor. Some medicines can make you feel dizzy. This can increase your chance of falling. Ask your doctor what other things that you can do to help prevent falls. This information is not intended to replace advice given to you by your health care provider. Make sure you discuss any questions you have with your health care provider. Document Released: 03/28/2009 Document Revised: 11/07/2015 Document Reviewed: 07/06/2014 Elsevier Interactive Patient Education  2017 [employer].

## 2018-02-28 DIAGNOSIS — R0602 Shortness of breath: Secondary | ICD-10-CM | POA: Diagnosis not present

## 2018-02-28 DIAGNOSIS — E1159 Type 2 diabetes mellitus with other circulatory complications: Secondary | ICD-10-CM | POA: Diagnosis not present

## 2018-02-28 DIAGNOSIS — I10 Essential (primary) hypertension: Secondary | ICD-10-CM | POA: Diagnosis not present

## 2018-02-28 DIAGNOSIS — Z794 Long term (current) use of insulin: Secondary | ICD-10-CM | POA: Diagnosis not present

## 2018-02-28 DIAGNOSIS — E119 Type 2 diabetes mellitus without complications: Secondary | ICD-10-CM | POA: Diagnosis not present

## 2018-03-02 ENCOUNTER — Other Ambulatory Visit: Payer: Self-pay | Admitting: Gastroenterology

## 2018-03-02 DIAGNOSIS — K7460 Unspecified cirrhosis of liver: Secondary | ICD-10-CM | POA: Diagnosis not present

## 2018-03-02 LAB — POCT INR: INR: 1 (ref <=1.1)

## 2018-03-08 ENCOUNTER — Ambulatory Visit
Admission: RE | Admit: 2018-03-08 | Discharge: 2018-03-08 | Disposition: A | Discharge status: Home / Self Care | Payer: Medicare Other | Source: Ambulatory Visit | Attending: Gastroenterology | Admitting: Gastroenterology

## 2018-03-08 DIAGNOSIS — K7460 Unspecified cirrhosis of liver: Secondary | ICD-10-CM

## 2018-03-08 DIAGNOSIS — N281 Cyst of kidney, acquired: Secondary | ICD-10-CM | POA: Diagnosis not present

## 2018-03-20 ENCOUNTER — Other Ambulatory Visit: Payer: Self-pay | Admitting: Family Medicine

## 2018-03-20 DIAGNOSIS — I10 Essential (primary) hypertension: Secondary | ICD-10-CM

## 2018-03-27 ENCOUNTER — Other Ambulatory Visit: Payer: Self-pay | Admitting: Family Medicine

## 2018-03-27 DIAGNOSIS — I10 Essential (primary) hypertension: Secondary | ICD-10-CM

## 2018-04-11 ENCOUNTER — Other Ambulatory Visit: Payer: Self-pay | Admitting: Family Medicine

## 2018-04-11 DIAGNOSIS — I10 Essential (primary) hypertension: Secondary | ICD-10-CM

## 2018-04-11 DIAGNOSIS — H401122 Primary open-angle glaucoma, left eye, moderate stage: Secondary | ICD-10-CM | POA: Diagnosis not present

## 2018-04-11 MED ORDER — BISOPROLOL FUMARATE 10 MG PO TABS: 10.0000 mg | ORAL_TABLET | Freq: Every day | ORAL | 0 refills | Status: DC

## 2018-04-11 NOTE — Telephone Encounter (Signed)
Copied from [place] [id number]. Topic: Quick Communication - Rx Refill/Question >> Apr 11, 2018  1:06 PM Selinda Flavin B, NT wrote: **Patient calling and states that he is needing a refill on his beta blocker (does not know the name). States that his bottle says 1 refill, but the pharmacy will not refill it. Mentioned the bisoprolol and he said yes. Advised him that was last sent to the pharmacy on 03/27/18 as a 90 day supply. Patient states that the bottle is empty and does not remember if the pharmacy filled it as a 30 or a 90. States that he is currently out of this medication. Please advise.**   Medication: bisoprolol (ZEBETA) 10 MG tablet  Has the patient contacted their pharmacy? Yes.  States that he does not have any refills on this (Agent: If no, request that the patient contact the pharmacy for the refill.) (Agent: If yes, when and what did the pharmacy advise?)  Preferred Pharmacy (with phone number or street name): [place], [place]: Please be advised that RX refills may take up to 3 business days. We ask that you follow-up with your pharmacy.

## 2018-04-11 NOTE — Telephone Encounter (Signed)
Refill request was sent to Dr. [name] for approval and submission.  

## 2018-04-30 ENCOUNTER — Other Ambulatory Visit: Payer: Self-pay | Admitting: Family Medicine

## 2018-04-30 DIAGNOSIS — E782 Mixed hyperlipidemia: Secondary | ICD-10-CM

## 2018-05-17 ENCOUNTER — Encounter: Payer: Self-pay | Admitting: Family Medicine

## 2018-05-17 ENCOUNTER — Ambulatory Visit (INDEPENDENT_AMBULATORY_CARE_PROVIDER_SITE_OTHER): Payer: Medicare Other | Admitting: Family Medicine

## 2018-05-17 VITALS — BP 126/68 | HR 63 | Temp 98.2°F | Resp 16 | Ht 76.0 in | Wt 240.6 lb

## 2018-05-17 DIAGNOSIS — E782 Mixed hyperlipidemia: Secondary | ICD-10-CM | POA: Diagnosis not present

## 2018-05-17 DIAGNOSIS — I152 Hypertension secondary to endocrine disorders: Secondary | ICD-10-CM

## 2018-05-17 DIAGNOSIS — R809 Proteinuria, unspecified: Secondary | ICD-10-CM

## 2018-05-17 DIAGNOSIS — E1129 Type 2 diabetes mellitus with other diabetic kidney complication: Secondary | ICD-10-CM

## 2018-05-17 DIAGNOSIS — E1140 Type 2 diabetes mellitus with diabetic neuropathy, unspecified: Secondary | ICD-10-CM

## 2018-05-17 DIAGNOSIS — I10 Essential (primary) hypertension: Secondary | ICD-10-CM | POA: Diagnosis not present

## 2018-05-17 DIAGNOSIS — N400 Enlarged prostate without lower urinary tract symptoms: Secondary | ICD-10-CM

## 2018-05-17 DIAGNOSIS — E1159 Type 2 diabetes mellitus with other circulatory complications: Secondary | ICD-10-CM

## 2018-05-17 DIAGNOSIS — I8500 Esophageal varices without bleeding: Secondary | ICD-10-CM

## 2018-05-17 DIAGNOSIS — Z794 Long term (current) use of insulin: Secondary | ICD-10-CM | POA: Diagnosis not present

## 2018-05-17 MED ORDER — FINASTERIDE 5 MG PO TABS: 5.0000 mg | ORAL_TABLET | Freq: Every day | ORAL | 1 refills | Status: DC

## 2018-05-17 MED ORDER — TELMISARTAN-HCTZ 80-25 MG PO TABS: 1.0000 | ORAL_TABLET | Freq: Every day | ORAL | 1 refills | Status: DC

## 2018-05-17 NOTE — Progress Notes (Signed)
Name: Jeremy [name]   MRN: [id number]    DOB: 09-21-1935   Date:05/17/2018       Progress Note  Subjective  Chief Complaint  Chief Complaint  Patient presents with  . Follow-up    6 mth f/u  . Diabetes    BS was 65 this morning. patient will see Dr. Honor Junes  . Hypertension  . Gastroesophageal Reflux  . Hyperlipidemia  . Benign Prostatic Hypertrophy  . Medication Refill  . Labs Only    HPI  DMII: he was diagnosed in the 42's during a hospital stay for small bowel obstruction. He was on oral medications for a long time, started on insulin a couple of years ago. Currently seeing Endocrinologist. He denies polyphagia, polyuria or polydipsia. He is compliant, up to date with eye exam, foot exam and urine micro. No side effects of medications, but glucose has been going down to 65-90. He has neuropathy but doing well without medication. He has follow up tomorrow with Endocrinologist . Last hgbA1C was at goal.    HTN: he denies chest pain or palpitation. Had a stress test done recently because of one episodes of chest pain and it was negative, not on aspirin because of history of esophagitis and previous diverticulitis with bleeding.   GERD with esophagitis, sees gastroenterologist and is back on Nexium  He states symptoms are controlled, no regurgitation, heartburn or epigastric pain   Hyperlipidemia: no myalgia, reviewed last labs and LDL was at goal , but due for repeat labs   Esophageal varices: he was seeing EGD and was sent to GI and EGD showed some variceal veins, but mostly on proximal esophagus, he is on PPI and had multiple studies but negative for cirrhosis so far.    Patient Active Problem List   Diagnosis Date Noted  . History of diverticulitis of colon 11/15/2017  . Esophageal varices without bleeding ([place]) 11/15/2017  . Hyperlipidemia due to type 2 diabetes mellitus ([place]) 03/14/2017  . Bilateral lower extremity edema 03/27/2016  . Vitamin D deficiency 12/05/2015   . Incomplete emptying of bladder 10/11/2015  . Hx of transient ischemic attack (TIA) 09/17/2015  . Transient vision disturbance of both eyes 08/28/2015  . Stroke-like symptoms 08/28/2015  . Diabetes mellitus type 2 without retinopathy ([place]) 05/31/2015  . Hyperlipidemia 05/30/2015  . Abnormal CT of the chest 05/15/2015  . Abnormal abdominal CT scan 05/15/2015  . Chronic kidney disease (CKD), stage I 01/08/2015  . Type II diabetes mellitus with renal manifestations, uncontrolled ([place]) 01/08/2015  . Type 2 diabetes, uncontrolled, with neuropathy ([place]) 01/08/2015  . Obesity 01/08/2015  . Proteinuria 01/08/2015  . Atrophy of vocal cord 02/07/2014  . Incisional hernia, without obstruction or gangrene 03/23/2013  . Enlarged prostate with lower urinary tract symptoms (LUTS) 11/03/2012  . Myopic degeneration, bilateral 08/12/2012  . Nonexudative age-related macular degeneration 07/15/2012  . Anisometropia 04/08/2012  . Pseudoaphakia 04/08/2012  . History of retinal detachment 04/08/2012  . Presence of intraocular lens 04/08/2012  . Bladder diverticulum 02/12/2012  . Diverticulum of bladder 02/12/2012  . Dysphonia 02/08/2012  . Secondary open-angle glaucoma of left eye, severe stage 01/13/2012  . Osteopenia 03/18/2010  . Gastro-esophageal reflux disease with esophagitis 02/07/2008  . Benign essential HTN 02/03/2007  . Hypertension associated with diabetes ([place]) 02/03/2007    Past Surgical History:  Procedure Laterality Date  . APPENDECTOMY    . BLADDER DIVERTICULECTOMY  2013  . COLON SURGERY Left 1980's  . COLONOSCOPY W/ POLYPECTOMY  1990's  Dr Sharlet Salina  . COLONOSCOPY WITH PROPOFOL N/A 12/21/2014   Procedure: COLONOSCOPY WITH PROPOFOL;  Surgeon: Lollie Sails, MD;  Location: [facility] ENDOSCOPY;  Service: Endoscopy;  Laterality: N/A;  . COLONOSCOPY WITH PROPOFOL N/A 08/16/2017   Procedure: COLONOSCOPY WITH PROPOFOL;  Surgeon: Lollie Sails, MD;  Location: [facility] ENDOSCOPY;  Service:  Endoscopy;  Laterality: N/A;  . ESOPHAGOGASTRODUODENOSCOPY (EGD) WITH PROPOFOL N/A 02/15/2015   Procedure: ESOPHAGOGASTRODUODENOSCOPY (EGD) WITH PROPOFOL;  Surgeon: Lollie Sails, MD;  Location: [facility] ENDOSCOPY;  Service: Endoscopy;  Laterality: N/A;  . ESOPHAGOGASTRODUODENOSCOPY (EGD) WITH PROPOFOL N/A 06/10/2017   Procedure: ESOPHAGOGASTRODUODENOSCOPY (EGD) WITH PROPOFOL;  Surgeon: Lollie Sails, MD;  Location: [facility] ENDOSCOPY;  Service: Endoscopy;  Laterality: N/A;  . ESOPHAGOGASTRODUODENOSCOPY (EGD) WITH PROPOFOL N/A 08/16/2017   Procedure: ESOPHAGOGASTRODUODENOSCOPY (EGD) WITH PROPOFOL;  Surgeon: Lollie Sails, MD;  Location[facility] ENDOSCOPY;  Service: Endoscopy;  Laterality: N/A;  . EYE SURGERY    . GLAUCOMA SURGERY Left [place]     [place], Dr Sharlet Salina  . INGUINAL HERNIA REPAIR Bilateral 1960's  . LARYNX SURGERY  1990   3 times  . RETINAL DETACHMENT SURGERY Right   . TONSILLECTOMY    . URETER SURGERY  2013   Dr Jacqlyn Larsen    Family History  Problem Relation Age of Onset  . Diabetes Mother   . Cataracts Mother   . Metabolic syndrome Mother   . Blindness Mother        r/t diabetes  . Cataracts Father   . Glaucoma Father        Retina-Detachment  . Atrial fibrillation Father   . Diverticulitis Sister   . Glaucoma Brother   . Diabetes Brother   . Cancer Sister        Gallbladder  . Cancer Daughter     Social History   Socioeconomic History  . Marital status: Married    Spouse name: Pamala Hurry   . Number of children: 3  . Years of education: Not on file  . Highest education level: Professional school degree (e.g., MD, DDS, DVM, JD)  Occupational History  . Occupation: Industrial/product designer     Comment: still working - Engineer, maintenance as a Engineer, maintenance  . Financial resource strain: Not hard at all  . Food insecurity:    Worry: Never true    Inability: Never true  . Transportation needs:     Medical: No    Non-medical: No  Tobacco Use  . Smoking status: Never Smoker  . Smokeless tobacco: Never Used  . Tobacco comment: smoking cessation materials not required  Substance and Sexual Activity  . Alcohol use: No    Frequency: Never  . Drug use: No  . Sexual activity: Not Currently  Lifestyle  . Physical activity:    Days per week: 2 days    Minutes per session: 60 min  . Stress: Not at all  Relationships  . Social connections:    Talks on phone: More than three times a week    Gets together: Once a week    Attends religious service: 1 to 4 times per year    Active member of club or organization: Yes    Attends meetings of clubs or organizations: Never    Relationship status: Married  . Intimate partner violence:    Fear of current or ex partner: No    Emotionally abused: No  Physically abused: No    Forced sexual activity: No  Other Topics Concern  . Not on file  Social History Narrative   Married , still works as a Engineer, drilling Environmental consultant)   Three children ( one died in his 67's with colon cancer)      Current Outpatient Medications:  .  bisoprolol (ZEBETA) 10 MG tablet, Take 1 tablet (10 mg total) by mouth daily., Disp: 90 tablet, Rfl: 0 .  brimonidine (ALPHAGAN P) 0.1 % SOLN, Place 1 drop into the left eye 2 (two) times daily. , Disp: , Rfl:  .  BYDUREON 2 MG PEN, INJECT 2 MG SUBCUTANEOUSLY ONCE A WEEK., Disp: 4 each, Rfl: 2 .  dorzolamide (TRUSOPT) 2 % ophthalmic solution, Place 1 drop into the left eye 2 (two) times daily., Disp: , Rfl: 3 .  esomeprazole (NEXIUM) 20 MG capsule, Take 20 mg by mouth daily., Disp: , Rfl:  .  finasteride (PROSCAR) 5 MG tablet, Take 1 tablet (5 mg total) by mouth daily., Disp: 90 tablet, Rfl: 1 .  INVOKANA 300 MG TABS tablet, TAKE ONE TABLET BY MOUTH ONCE DAILY, Disp: 90 tablet, Rfl: 0 .  loratadine (CLARITIN) 10 MG tablet, Take 10 mg by mouth daily., Disp: , Rfl:  .  meloxicam (MOBIC) 15 MG tablet, Take 15 mg by mouth daily as  needed., Disp: , Rfl:  .  metFORMIN (GLUCOPHAGE) 1000 MG tablet, Take 1 tablet (1,000 mg total) by mouth 2 (two) times daily., Disp: 180 tablet, Rfl: 1 .  ranitidine (ZANTAC) 150 MG tablet, Take 150 mg by mouth daily as needed., Disp: , Rfl:  .  rosuvastatin (CRESTOR) 10 MG tablet, TAKE 1 TABLET BY MOUTH AT BEDTIME, Disp: 90 tablet, Rfl: 1 .  telmisartan-hydrochlorothiazide (MICARDIS HCT) 80-25 MG tablet, Take 1 tablet by mouth daily., Disp: 90 tablet, Rfl: 1 .  timolol (TIMOPTIC-XR) 0.25 % ophthalmic gel-forming, Place 1 drop into the left eye 2 (two) times daily. , Disp: , Rfl:   Allergies  Allergen Reactions  . Demerol [Meperidine] Other (See Comments) and Anaphylaxis    MAKES BLOOD PRESSURE BOTTOM OUT hypotension  . Dilaudid [Hydromorphone Hcl]   . Hydromorphone     Other reaction(s): Unknown MAKES BLOOD PRESSURE BOTTOM OUT  . Actos [Pioglitazone] Other (See Comments) and Hives  . Dapagliflozin Rash  . Penicillins Rash    I personally reviewed active problem list, medication list, allergies, family history, social history with the patient/caregiver today.   ROS  Constitutional: Negative for fever or weight change.  Respiratory: Negative for cough and shortness of breath.   Cardiovascular: Negative for chest pain or palpitations.  Gastrointestinal: Negative for abdominal pain, no bowel changes.  Musculoskeletal: Negative for gait problem or joint swelling.  Skin: Negative for rash.  Neurological: Negative for dizziness or headache.  No other specific complaints in a complete review of systems (except as listed in HPI above).  Objective  Vitals:   05/17/18 1309  BP: 126/68  Pulse: 63  Resp: 16  Temp: 98.2 F (36.8 C)  TempSrc: Oral  SpO2: 95%  Weight: 240 lb 9.6 oz (109.1 kg)  Height: 6\' 4"  (1.93 m)    Body mass index is 29.29 kg/m.  Physical Exam  Constitutional: Patient appears well-developed and well-nourished. Overweight.  No distress.  HEENT: head  atraumatic, normocephalic, pupils equal and reactive to light,  neck supple, throat within normal limits Cardiovascular: Normal rate, regular rhythm and normal heart sounds.  No murmur heard. No BLE edema. Pulmonary/Chest:  Effort normal and breath sounds normal. No respiratory distress. Abdominal: Soft.  There is no tenderness. Psychiatric: Patient has a normal mood and affect. behavior is normal. Judgment and thought content normal.  PHQ2/9: Depression screen [facility] 2/9 05/17/2018 02/25/2018 11/15/2017 07/12/2017 04/08/2017  Decreased Interest 0 0 0 0 0  Down, Depressed, Hopeless 0 0 0 0 0  PHQ - 2 Score 0 0 0 0 0  Altered sleeping 0 0 - - -  Tired, decreased energy 0 0 - - -  Change in appetite 0 0 - - -  Feeling bad or failure about yourself  0 0 - - -  Trouble concentrating 0 0 - - -  Moving slowly or fidgety/restless 0 0 - - -  Suicidal thoughts 0 0 - - -  PHQ-9 Score 0 0 - - -  Difficult doing work/chores Not difficult at all Not difficult at all - - -     Fall Risk: Fall Risk  05/17/2018 02/25/2018 11/15/2017 07/12/2017 04/08/2017  Falls in the past year? 0 No No No No  Number falls in past yr: 0 - - - -  Injury with Fall? 0 - - - -  Risk for fall due to : - Impaired vision - - -  Risk for fall due to: Comment - wears eyeglasses - - -     Functional Status Survey: Is the patient deaf or have difficulty hearing?: No Does the patient have difficulty seeing, even when wearing glasses/contacts?: No Does the patient have difficulty concentrating, remembering, or making decisions?: No Does the patient have difficulty walking or climbing stairs?: No Does the patient have difficulty dressing or bathing?: No Does the patient have difficulty doing errands alone such as visiting a doctor's office or shopping?: No    Assessment & Plan  1. Benign essential HTN  - telmisartan-hydrochlorothiazide (MICARDIS HCT) 80-25 MG tablet; Take 1 tablet by mouth daily.  Dispense: 90 tablet; Refill:  1 - COMPLETE METABOLIC PANEL WITH GFR  2. Hypertension associated with diabetes ([place])  - telmisartan-hydrochlorothiazide (MICARDIS HCT) 80-25 MG tablet; Take 1 tablet by mouth daily.  Dispense: 90 tablet; Refill: 1 - COMPLETE METABOLIC PANEL WITH GFR  3. Type 2 diabetes mellitus with microalbuminuria, with long-term current use of insulin ([facility])  - telmisartan-hydrochlorothiazide (MICARDIS HCT) 80-25 MG tablet; Take 1 tablet by mouth daily.  Dispense: 90 tablet; Refill: 1  4. Type 2 diabetes, controlled, with neuropathy ([place])   5. Benign prostatic hyperplasia without lower urinary tract symptoms  Used to see Dr. Jacqlyn Larsen, he would like to recheck PSA and go see Dr. Bernardo Heater if abnormal  - finasteride (PROSCAR) 5 MG tablet; Take 1 tablet (5 mg total) by mouth daily.  Dispense: 90 tablet; Refill: 1 - PSA  6. Mixed hyperlipidemia  - Lipid panel

## 2018-05-18 DIAGNOSIS — I10 Essential (primary) hypertension: Secondary | ICD-10-CM | POA: Diagnosis not present

## 2018-05-18 DIAGNOSIS — E119 Type 2 diabetes mellitus without complications: Secondary | ICD-10-CM | POA: Diagnosis not present

## 2018-05-18 DIAGNOSIS — E1159 Type 2 diabetes mellitus with other circulatory complications: Secondary | ICD-10-CM | POA: Diagnosis not present

## 2018-05-18 DIAGNOSIS — Z794 Long term (current) use of insulin: Secondary | ICD-10-CM | POA: Diagnosis not present

## 2018-05-18 DIAGNOSIS — E1169 Type 2 diabetes mellitus with other specified complication: Secondary | ICD-10-CM | POA: Diagnosis not present

## 2018-05-18 DIAGNOSIS — E785 Hyperlipidemia, unspecified: Secondary | ICD-10-CM | POA: Diagnosis not present

## 2018-05-18 LAB — HEMOGLOBIN A1C: Hemoglobin A1C: 6.1

## 2018-05-18 LAB — COMPLETE METABOLIC PANEL WITH GFR
AG Ratio: 2.7 (calc) — ABNORMAL HIGH (ref 1.0–2.5)
ALT: 19 U/L (ref 9–46)
AST: 17 U/L (ref 10–35)
Albumin: 4.6 g/dL (ref 3.6–5.1)
Alkaline phosphatase (APISO): 42 U/L (ref 40–115)
BUN: 21 mg/dL (ref 7–25)
CO2: 30 mmol/L (ref 20–32)
Calcium: 9.5 mg/dL (ref 8.6–10.3)
Chloride: 103 mmol/L (ref 98–110)
Creat: 0.75 mg/dL (ref 0.70–1.11)
GFR, Est African American: 100 mL/min/{1.73_m2} (ref >=60)
GFR, Est Non African American: 87 mL/min/{1.73_m2} (ref >=60)
Globulin: 1.7 g/dL (calc) — ABNORMAL LOW (ref 1.9–3.7)
Glucose, Bld: 51 mg/dL — ABNORMAL LOW (ref 65–139)
Potassium: 3.9 mmol/L (ref 3.5–5.3)
Sodium: 141 mmol/L (ref 135–146)
TOTAL PROTEIN: 6.3 g/dL (ref 6.1–8.1)
Total Bilirubin: 0.9 mg/dL (ref 0.2–1.2)

## 2018-05-18 LAB — LIPID PANEL
Cholesterol: 115 mg/dL (ref <200)
HDL: 40 mg/dL — ABNORMAL LOW (ref >40)
LDL Cholesterol (Calc): 46 mg/dL (calc)
Non-HDL Cholesterol (Calc): 75 mg/dL (calc) (ref <130)
Total CHOL/HDL Ratio: 2.9 (calc) (ref <5.0)
Triglycerides: 232 mg/dL — ABNORMAL HIGH (ref <150)

## 2018-05-18 LAB — PSA: PSA: <0.1 ng/mL (ref <=4.0)

## 2018-05-23 ENCOUNTER — Encounter: Payer: Self-pay | Admitting: Family Medicine

## 2018-05-25 DIAGNOSIS — L851 Acquired keratosis [keratoderma] palmaris et plantaris: Secondary | ICD-10-CM | POA: Diagnosis not present

## 2018-05-25 DIAGNOSIS — B351 Tinea unguium: Secondary | ICD-10-CM | POA: Diagnosis not present

## 2018-05-25 DIAGNOSIS — E1142 Type 2 diabetes mellitus with diabetic polyneuropathy: Secondary | ICD-10-CM | POA: Diagnosis not present

## 2018-07-08 ENCOUNTER — Telehealth: Payer: Self-pay | Admitting: Family Medicine

## 2018-07-08 NOTE — Telephone Encounter (Signed)
Spoke with patient and he does not want a referral to Urology unless the imaging shows something. Patient is requesting a IVP be performed due to recent back pain and states he has not been gardening. Patient has a past history of bladder diverticulectomy and ureteral reimplantation. Patient is concerned about where this back pain is coming from and the possibility being from hydronephrosis. Patient states he used to see Dr. Jacqlyn Larsen but unsure where he is located at now that he is no longer with [facility]. Patient was informed Dr. Ancil Boozer is in the office and denied any fever, nausea and vomiting. Also was informed if he was very concerned about his back pain to be seen in the ER or with his urologist. Patient is now just requesting a call from Dr. Ancil Boozer when she gets back since this is not an emergency.

## 2018-07-08 NOTE — Telephone Encounter (Signed)
Copied from [place] [id number]. Topic: Quick Communication - See Telephone Encounter >> Jul 08, 2018  2:51 PM Blase Mess A wrote: CRM for notification. See Telephone encounter for: 07/08/18.  Patient is calling to speak with Dr. Ancil Boozer assistant  [phone] Wanting a referral for a urologist or and order IVP to look at his kidneys.

## 2018-07-10 NOTE — Telephone Encounter (Signed)
I will still need to see him to place the order

## 2018-07-11 ENCOUNTER — Encounter: Payer: Self-pay | Admitting: Family Medicine

## 2018-07-11 ENCOUNTER — Ambulatory Visit (INDEPENDENT_AMBULATORY_CARE_PROVIDER_SITE_OTHER): Payer: Medicare Other | Admitting: Family Medicine

## 2018-07-11 VITALS — BP 140/70 | HR 70 | Temp 97.9°F | Resp 16 | Ht 76.0 in | Wt 250.2 lb

## 2018-07-11 DIAGNOSIS — Z9889 Other specified postprocedural states: Secondary | ICD-10-CM

## 2018-07-11 DIAGNOSIS — R109 Unspecified abdominal pain: Secondary | ICD-10-CM | POA: Diagnosis not present

## 2018-07-11 NOTE — Telephone Encounter (Signed)
Spoke with pt and he scheduled for this afternoon

## 2018-07-11 NOTE — Progress Notes (Signed)
Name: Jeremy [name]   MRN: [id number]    DOB: 12-14-1935   Date:07/11/2018       Progress Note  Subjective  Chief Complaint  Chief Complaint  Patient presents with  . Referral    HPI  Right flank pain: Dr. Oswaldo Milian is concerned because right flank pain has been going on for the past 6 months, initially intermittent, however recently it has changed to a constant ache, right now 5/10 , but it can go up to 8/10 and sometimes down to 2/10. He has a history of ureter surgery, TURP . No previous history of kidney stones, he denies change in urinary frequency of hematuria, no fever or chills. He has DM but glucose has been in the 90's fasting  Patient Active Problem List   Diagnosis Date Noted  . History of diverticulitis of colon 11/15/2017  . Esophageal varices without bleeding ([place]) 11/15/2017  . Hyperlipidemia due to type 2 diabetes mellitus ([place]) 03/14/2017  . Bilateral lower extremity edema 03/27/2016  . Vitamin D deficiency 12/05/2015  . Incomplete emptying of bladder 10/11/2015  . Hx of transient ischemic attack (TIA) 09/17/2015  . Transient vision disturbance of both eyes 08/28/2015  . Stroke-like symptoms 08/28/2015  . Diabetes mellitus type 2 without retinopathy ([place]) 05/31/2015  . Hyperlipidemia 05/30/2015  . Abnormal CT of the chest 05/15/2015  . Abnormal abdominal CT scan 05/15/2015  . Chronic kidney disease (CKD), stage I 01/08/2015  . Type II diabetes mellitus with renal manifestations, uncontrolled ([place]) 01/08/2015  . Type 2 diabetes, uncontrolled, with neuropathy ([place]) 01/08/2015  . Obesity 01/08/2015  . Proteinuria 01/08/2015  . Atrophy of vocal cord 02/07/2014  . Incisional hernia, without obstruction or gangrene 03/23/2013  . Enlarged prostate with lower urinary tract symptoms (LUTS) 11/03/2012  . Myopic degeneration, bilateral 08/12/2012  . Nonexudative age-related macular degeneration 07/15/2012  . Anisometropia 04/08/2012  . Pseudoaphakia 04/08/2012  .  History of retinal detachment 04/08/2012  . Presence of intraocular lens 04/08/2012  . Bladder diverticulum 02/12/2012  . Diverticulum of bladder 02/12/2012  . Dysphonia 02/08/2012  . Secondary open-angle glaucoma of left eye, severe stage 01/13/2012  . Osteopenia 03/18/2010  . Gastro-esophageal reflux disease with esophagitis 02/07/2008  . Benign essential HTN 02/03/2007  . Hypertension associated with diabetes ([place]) 02/03/2007    Past Surgical History:  Procedure Laterality Date  . APPENDECTOMY    . BLADDER DIVERTICULECTOMY  2013  . COLON SURGERY Left 1980's  . COLONOSCOPY W/ POLYPECTOMY  1990's   Dr Sharlet Salina  . COLONOSCOPY WITH PROPOFOL N/A 12/21/2014   Procedure: COLONOSCOPY WITH PROPOFOL;  Surgeon: Lollie Sails, MD;  Location: [facility] ENDOSCOPY;  Service: Endoscopy;  Laterality: N/A;  . COLONOSCOPY WITH PROPOFOL N/A 08/16/2017   Procedure: COLONOSCOPY WITH PROPOFOL;  Surgeon: Lollie Sails, MD;  Location: [facility] ENDOSCOPY;  Service: Endoscopy;  Laterality: N/A;  . ESOPHAGOGASTRODUODENOSCOPY (EGD) WITH PROPOFOL N/A 02/15/2015   Procedure: ESOPHAGOGASTRODUODENOSCOPY (EGD) WITH PROPOFOL;  Surgeon: Lollie Sails, MD;  Location: [facility] ENDOSCOPY;  Service: Endoscopy;  Laterality: N/A;  . ESOPHAGOGASTRODUODENOSCOPY (EGD) WITH PROPOFOL N/A 06/10/2017   Procedure: ESOPHAGOGASTRODUODENOSCOPY (EGD) WITH PROPOFOL;  Surgeon: Lollie Sails, MD;  Location: [facility] ENDOSCOPY;  Service: Endoscopy;  Laterality: N/A;  . ESOPHAGOGASTRODUODENOSCOPY (EGD) WITH PROPOFOL N/A 08/16/2017   Procedure: ESOPHAGOGASTRODUODENOSCOPY (EGD) WITH PROPOFOL;  Surgeon: Lollie Sails, MD;  Location: [facility] ENDOSCOPY;  Service: Endoscopy;  Laterality: N/A;  . EYE SURGERY    . GLAUCOMA SURGERY Left [place]     [place], Dr Sharlet Salina  . INGUINAL HERNIA REPAIR Bilateral 1960's  . LARYNX SURGERY  1990   3 times  . RETINAL DETACHMENT SURGERY  Right   . TONSILLECTOMY    . turp  2013  . URETER SURGERY  2013   Dr Jacqlyn Larsen    Family History  Problem Relation Age of Onset  . Diabetes Mother   . Cataracts Mother   . Metabolic syndrome Mother   . Blindness Mother        r/t diabetes  . Cataracts Father   . Glaucoma Father        Retina-Detachment  . Atrial fibrillation Father   . Diverticulitis Sister   . Glaucoma Brother   . Diabetes Brother   . Cancer Sister        Gallbladder  . Cancer Daughter     Social History   Socioeconomic History  . Marital status: Married    Spouse name: Pamala Hurry   . Number of children: 3  . Years of education: Not on file  . Highest education level: Professional school degree (e.g., MD, DDS, DVM, JD)  Occupational History  . Occupation: Industrial/product designer     Comment: still working - Engineer, maintenance as a Engineer, maintenance  . Financial resource strain: Not hard at all  . Food insecurity:    Worry: Never true    Inability: Never true  . Transportation needs:    Medical: No    Non-medical: No  Tobacco Use  . Smoking status: Never Smoker  . Smokeless tobacco: Never Used  . Tobacco comment: smoking cessation materials not required  Substance and Sexual Activity  . Alcohol use: No    Frequency: Never  . Drug use: No  . Sexual activity: Not Currently  Lifestyle  . Physical activity:    Days per week: 2 days    Minutes per session: 60 min  . Stress: Not at all  Relationships  . Social connections:    Talks on phone: More than three times a week    Gets together: Once a week    Attends religious service: 1 to 4 times per year    Active member of club or organization: Yes    Attends meetings of clubs or organizations: Never    Relationship status: Married  . Intimate partner violence:    Fear of current or ex partner: No    Emotionally abused: No    Physically abused: No    Forced sexual activity: No  Other Topics Concern  . Not on file  Social History Narrative   Married ,  still works as a Engineer, drilling Environmental consultant)   Three children ( one died in his 78's with colon cancer)      Current Outpatient Medications:  .  bisoprolol (ZEBETA) 10 MG tablet, Take 1 tablet (10 mg total) by mouth daily., Disp: 90 tablet, Rfl: 0 .  brimonidine (ALPHAGAN P) 0.1 % SOLN, Place 1 drop into the left eye 2 (two) times daily. , Disp: , Rfl:  .  BYDUREON 2 MG PEN, INJECT 2 MG SUBCUTANEOUSLY ONCE A WEEK., Disp: 4 each, Rfl: 2 .  dorzolamide (TRUSOPT) 2 % ophthalmic solution, Place 1 drop into the left eye 2 (two) times daily., Disp: , Rfl: 3 .  esomeprazole (NEXIUM) 20 MG capsule, Take 20 mg by mouth daily., Disp: , Rfl:  .  finasteride (PROSCAR) 5 MG tablet,  Take 1 tablet (5 mg total) by mouth daily., Disp: 90 tablet, Rfl: 1 .  INVOKANA 300 MG TABS tablet, TAKE ONE TABLET BY MOUTH ONCE DAILY, Disp: 90 tablet, Rfl: 0 .  loratadine (CLARITIN) 10 MG tablet, Take 10 mg by mouth daily., Disp: , Rfl:  .  meloxicam (MOBIC) 15 MG tablet, Take 15 mg by mouth daily as needed., Disp: , Rfl:  .  metFORMIN (GLUCOPHAGE) 1000 MG tablet, Take 1 tablet (1,000 mg total) by mouth 2 (two) times daily., Disp: 180 tablet, Rfl: 1 .  rosuvastatin (CRESTOR) 10 MG tablet, TAKE 1 TABLET BY MOUTH AT BEDTIME, Disp: 90 tablet, Rfl: 1 .  telmisartan-hydrochlorothiazide (MICARDIS HCT) 80-25 MG tablet, Take 1 tablet by mouth daily., Disp: 90 tablet, Rfl: 1 .  timolol (TIMOPTIC-XR) 0.25 % ophthalmic gel-forming, Place 1 drop into the left eye 2 (two) times daily. , Disp: , Rfl:   Allergies  Allergen Reactions  . Demerol [Meperidine] Other (See Comments) and Anaphylaxis    MAKES BLOOD PRESSURE BOTTOM OUT hypotension  . Dilaudid [Hydromorphone Hcl]   . Hydromorphone     Other reaction(s): Unknown MAKES BLOOD PRESSURE BOTTOM OUT  . Actos [Pioglitazone] Other (See Comments) and Hives  . Dapagliflozin Rash  . Penicillins Rash    I personally reviewed active problem list, medication list, allergies, family  history, social history with the patient/caregiver today.   ROS  Ten systems reviewed and is negative except as mentioned in HPI   Objective  Vitals:   07/11/18 1338  BP: 140/70  Pulse: 70  Resp: 16  Temp: 97.9 F (36.6 C)  TempSrc: Oral  SpO2: 96%  Weight: 250 lb 3.2 oz (113.5 kg)  Height: 6\' 4"  (1.93 m)    Body mass index is 30.46 kg/m.  Physical Exam  Constitutional: Patient appears well-developed and well-nourished. Obese No distress.  HEENT: head atraumatic, normocephalic, pupils equal and reactive to light, neck supple, throat within normal limits Cardiovascular: Normal rate, regular rhythm and normal heart sounds.  No murmur heard. No BLE edema. Pulmonary/Chest: Effort normal and breath sounds normal. No respiratory distress. Abdominal: Soft.  There is no tenderness. Positive right CVA tenderness  Psychiatric: Patient has a normal mood and affect. behavior is normal. Judgment and thought content normal.  Recent Results (from the past 2160 hour(s))  Lipid panel     Status: Abnormal   Collection Time: 05/17/18  1:57 PM  Result Value Ref Range   Cholesterol 115 <200 mg/dL   HDL 40 (L) >40 mg/dL   Triglycerides 232 (H) <150 mg/dL    Comment: . If a non-fasting specimen was collected, consider repeat triglyceride testing on a fasting specimen if clinically indicated.  Yates Decamp et al. J. of Clin. Lipidol. 9242;6:834-196. Marland Kitchen    LDL Cholesterol (Calc) 46 mg/dL (calc)    Comment: Reference range: <100 . Desirable range <100 mg/dL for primary prevention;   <70 mg/dL for patients with CHD or diabetic patients  with > or = 2 CHD risk factors. [place] Kitchen LDL-C is now calculated using the Martin-Hopkins  calculation, which is a validated novel method providing  better accuracy than the Friedewald equation in the  estimation of LDL-C.  Cresenciano Genre et al. Annamaria Helling. 2229;798(92): 2061-2068  (http://education.[facility].com/faq/FAQ164)    Total CHOL/HDL Ratio 2.9 <5.0 (calc)    Non-HDL Cholesterol (Calc) 75 <130 mg/dL (calc)    Comment: For patients with diabetes plus 1 major ASCVD risk  factor, treating to a non-HDL-C goal of <100 mg/dL  (LDL-C of <  70 mg/dL) is considered a therapeutic  option.   COMPLETE METABOLIC PANEL WITH GFR     Status: Abnormal   Collection Time: 05/17/18  1:57 PM  Result Value Ref Range   Glucose, Bld 51 (L) 65 - 139 mg/dL    Comment: .        Non-fasting reference interval .    BUN 21 7 - 25 mg/dL   Creat 0.75 0.70 - 1.11 mg/dL    Comment: For patients >32 years of age, the reference limit for Creatinine is approximately 13% higher for people identified as African-American. .    GFR, Est Non African American 87 > OR = 60 mL/min/1.26m2   GFR, Est African American 100 > OR = 60 mL/min/1.25m2   BUN/Creatinine Ratio NOT APPLICABLE 6 - 22 (calc)   Sodium 141 135 - 146 mmol/L   Potassium 3.9 3.5 - 5.3 mmol/L   Chloride 103 98 - 110 mmol/L   CO2 30 20 - 32 mmol/L   Calcium 9.5 8.6 - 10.3 mg/dL   Total Protein 6.3 6.1 - 8.1 g/dL   Albumin 4.6 3.6 - 5.1 g/dL   Globulin 1.7 (L) 1.9 - 3.7 g/dL (calc)   AG Ratio 2.7 (H) 1.0 - 2.5 (calc)   Total Bilirubin 0.9 0.2 - 1.2 mg/dL   Alkaline phosphatase (APISO) 42 40 - 115 U/L   AST 17 10 - 35 U/L   ALT 19 9 - 46 U/L  PSA     Status: None   Collection Time: 05/17/18  1:57 PM  Result Value Ref Range   PSA <0.1 < OR = 4.0 ng/mL    Comment: The total PSA value from this assay system is  standardized against the WHO standard. The test  result will be approximately 20% lower when compared  to the equimolar-standardized total PSA (Beckman  Coulter). Comparison of serial PSA results should be  interpreted with this fact in mind. . This test was performed using the Siemens  chemiluminescent method. Values obtained from  different assay methods cannot be used interchangeably. PSA levels, regardless of value, should not be interpreted as absolute evidence of the presence or absence of  disease.       PHQ2/9: Depression screen [facility] 2/9 05/17/2018 02/25/2018 11/15/2017 07/12/2017 04/08/2017  Decreased Interest 0 0 0 0 0  Down, Depressed, Hopeless 0 0 0 0 0  PHQ - 2 Score 0 0 0 0 0  Altered sleeping 0 0 - - -  Tired, decreased energy 0 0 - - -  Change in appetite 0 0 - - -  Feeling bad or failure about yourself  0 0 - - -  Trouble concentrating 0 0 - - -  Moving slowly or fidgety/restless 0 0 - - -  Suicidal thoughts 0 0 - - -  PHQ-9 Score 0 0 - - -  Difficult doing work/chores Not difficult at all Not difficult at all - - -     Fall Risk: Fall Risk  07/11/2018 05/17/2018 02/25/2018 11/15/2017 07/12/2017  Falls in the past year? 0 0 No No No  Number falls in past yr: 0 0 - - -  Injury with Fall? 0 0 - - -  Risk for fall due to : - - Impaired vision - -  Risk for fall due to: Comment - - wears eyeglasses - -    Assessment & Plan  1. Right flank pain  - CULTURE, URINE COMPREHENSIVE - CT RENAL ABD W/WO; Future  2. History of ureter repair  - CULTURE, URINE COMPREHENSIVE  3. Abdominal pain, unspecified abdominal location  - CT RENAL ABD W/WO; Future

## 2018-07-14 ENCOUNTER — Ambulatory Visit: Admission: RE | Admit: 2018-07-14 | Payer: Medicare Other | Source: Ambulatory Visit

## 2018-07-14 ENCOUNTER — Ambulatory Visit: Payer: Medicare Other

## 2018-07-14 LAB — URINE CULTURE
MICRO NUMBER:: 109742
SPECIMEN QUALITY:: ADEQUATE

## 2018-07-21 ENCOUNTER — Ambulatory Visit
Admission: RE | Admit: 2018-07-21 | Discharge: 2018-07-21 | Disposition: A | Discharge status: Home / Self Care | Payer: Medicare Other | Source: Ambulatory Visit | Attending: Family Medicine | Admitting: Family Medicine

## 2018-07-21 ENCOUNTER — Encounter: Payer: Self-pay | Admitting: Family Medicine

## 2018-07-21 DIAGNOSIS — Z9889 Other specified postprocedural states: Secondary | ICD-10-CM | POA: Diagnosis not present

## 2018-07-21 DIAGNOSIS — R109 Unspecified abdominal pain: Secondary | ICD-10-CM | POA: Insufficient documentation

## 2018-07-21 DIAGNOSIS — I700 Atherosclerosis of aorta: Secondary | ICD-10-CM | POA: Insufficient documentation

## 2018-07-21 LAB — POCT I-STAT CREATININE: Creatinine, Ser: 0.8 mg/dL (ref 0.61–1.24)

## 2018-07-21 MED ORDER — IOPAMIDOL (ISOVUE-300) INJECTION 61%
100.0000 mL | Freq: Once | INTRAVENOUS | Status: AC | PRN
  Administered 2018-07-21: 100 mL via INTRAVENOUS

## 2018-07-22 ENCOUNTER — Other Ambulatory Visit: Payer: Self-pay | Admitting: Family Medicine

## 2018-07-22 DIAGNOSIS — R109 Unspecified abdominal pain: Secondary | ICD-10-CM

## 2018-07-29 ENCOUNTER — Other Ambulatory Visit: Payer: Self-pay | Admitting: Family Medicine

## 2018-07-29 NOTE — Telephone Encounter (Signed)
Copied from [place] [id number]. Topic: Quick Communication - Rx Refill/Question >> Jul 29, 2018  9:12 AM Alfredia Ferguson R wrote: Medication: meloxicam (MOBIC) 15 MG tablet  Has the patient contacted their pharmacy? No, patient state she was told by Dr Ancil Boozer she would send med in for him  Preferred Pharmacy (with phone number or street name): [facility] [address] [place] - [place] [phone] (Phone) [phone] (Fax)    Agent: Please be advised that RX refills may take up to 3 business days. We ask that you follow-up with your pharmacy.

## 2018-07-29 NOTE — Telephone Encounter (Signed)
Refill request for general medication: Meloxicam 15 mg  Last office visit: 07/11/2018  Last physical exam: 02/25/2018   Follow-ups on file. 11/16/2018

## 2018-07-30 MED ORDER — MELOXICAM 15 MG PO TABS: 15.0000 mg | ORAL_TABLET | Freq: Every day | ORAL | 1 refills | Status: DC | PRN

## 2018-08-10 DIAGNOSIS — H6123 Impacted cerumen, bilateral: Secondary | ICD-10-CM | POA: Diagnosis not present

## 2018-08-10 DIAGNOSIS — H9201 Otalgia, right ear: Secondary | ICD-10-CM | POA: Diagnosis not present

## 2018-08-19 ENCOUNTER — Encounter: Payer: Self-pay | Admitting: Urology

## 2018-08-19 ENCOUNTER — Ambulatory Visit (INDEPENDENT_AMBULATORY_CARE_PROVIDER_SITE_OTHER): Payer: Medicare Other | Admitting: Urology

## 2018-08-19 VITALS — BP 155/80 | HR 69 | Ht 75.5 in | Wt 249.1 lb

## 2018-08-19 DIAGNOSIS — R109 Unspecified abdominal pain: Secondary | ICD-10-CM | POA: Diagnosis not present

## 2018-08-19 NOTE — Progress Notes (Signed)
08/19/2018 12:15 PM   Jeremy [name] 08-Feb-1936 [id number]  Referring provider: Steele Sizer, MD [address] [place], [place] [address]  Chief Complaint  Patient presents with  . Back Pain    HPI: 83 year old male seen at the request of Dr. Ancil Boozer for evaluation of chronic right flank pain.  He has a 3-80-month history of right flank pain.  There are no identifiable precipitating, aggravating or alleviating factors.  Severity is mild to moderate.  Past urologic history remarkable for a bladder diverticulectomy and right ureteral reimplant by Dr. Jacqlyn Larsen in September 2013.  He was concerned he may have right hydronephrosis accounting for his pain.  Dr. Ancil Boozer ordered a CT of the abdomen pelvis with and without contrast which does not show right hydronephrosis.  He denies bothersome lower urinary tract symptoms or gross hematuria.   PMH: Past Medical History:  Diagnosis Date  . Chronic kidney disease   . Diabetes mellitus without complication ([place])   . Diabetic neuropathy ([place])   . Diverticula, colon 1980's  . Diverticulosis   . Enlarged prostate   . GERD (gastroesophageal reflux disease)   . Glaucoma   . Hemorrhoids   . High cholesterol   . History of hiatal hernia   . Hyperlipidemia   . Hypertension   . Myocardial infarction ([place])   . Obesity   . Onychomycosis   . Seizures ([place])   . Small bowel obstruction ([place]) 1990's  . Urination disorder     Surgical History: Past Surgical History:  Procedure Laterality Date  . APPENDECTOMY    . BLADDER DIVERTICULECTOMY  2013  . COLON SURGERY Left 1980's  . COLONOSCOPY W/ POLYPECTOMY  1990's   Dr Sharlet Salina  . COLONOSCOPY WITH PROPOFOL N/A 12/21/2014   Procedure: COLONOSCOPY WITH PROPOFOL;  Surgeon: Lollie Sails, MD;  Location: [facility] ENDOSCOPY;  Service: Endoscopy;  Laterality: N/A;  . COLONOSCOPY WITH PROPOFOL N/A 08/16/2017   Procedure: COLONOSCOPY WITH PROPOFOL;  Surgeon: Lollie Sails, MD;  Location[facility] ENDOSCOPY;  Service: Endoscopy;  Laterality: N/A;  . ESOPHAGOGASTRODUODENOSCOPY (EGD) WITH PROPOFOL N/A 02/15/2015   Procedure: ESOPHAGOGASTRODUODENOSCOPY (EGD) WITH PROPOFOL;  Surgeon: Lollie Sails, MD;  Location: [facility] ENDOSCOPY;  Service: Endoscopy;  Laterality: N/A;  . ESOPHAGOGASTRODUODENOSCOPY (EGD) WITH PROPOFOL N/A 06/10/2017   Procedure: ESOPHAGOGASTRODUODENOSCOPY (EGD) WITH PROPOFOL;  Surgeon: Lollie Sails, MD;  Location: [facility] ENDOSCOPY;  Service: Endoscopy;  Laterality: N/A;  . ESOPHAGOGASTRODUODENOSCOPY (EGD) WITH PROPOFOL N/A 08/16/2017   Procedure: ESOPHAGOGASTRODUODENOSCOPY (EGD) WITH PROPOFOL;  Surgeon: Lollie Sails, MD;  Location: [facility] ENDOSCOPY;  Service: Endoscopy;  Laterality: N/A;  . EYE SURGERY    . GLAUCOMA SURGERY Left [place]     [place], Dr Sharlet Salina  . INGUINAL HERNIA REPAIR Bilateral 1960's  . LARYNX SURGERY  1990   3 times  . RETINAL DETACHMENT SURGERY Right   . TONSILLECTOMY    . turp  2013  . URETER SURGERY  2013   Dr Jacqlyn Larsen    Home Medications:  Allergies as of 08/19/2018      Reactions   Demerol [meperidine] Other (See Comments), Anaphylaxis   MAKES BLOOD PRESSURE BOTTOM OUT hypotension   Dilaudid [hydromorphone Hcl]    Hydromorphone    Other reaction(s): Unknown MAKES BLOOD PRESSURE BOTTOM OUT   Meperidine Hcl    Actos [pioglitazone] Other (See Comments), Hives   Dapagliflozin Rash   Penicillins Rash  Medication List       Accurate as of August 19, 2018 12:15 PM. Always use your most recent med list.        Alphagan P 0.1 % Soln Generic drug:  brimonidine Place 1 drop into the left eye 2 (two) times daily.   bisoprolol 10 MG tablet Commonly known as:  ZEBETA Take 1 tablet (10 mg total) by mouth daily.   Bydureon 2 MG Pen Generic drug:  Exenatide ER INJECT 2 MG SUBCUTANEOUSLY ONCE A WEEK.   dorzolamide 2 % ophthalmic solution Commonly known  as:  TRUSOPT Place 1 drop into the left eye 2 (two) times daily.   finasteride 5 MG tablet Commonly known as:  PROSCAR Take 1 tablet (5 mg total) by mouth daily.   Invokana 300 MG Tabs tablet Generic drug:  canagliflozin TAKE ONE TABLET BY MOUTH ONCE DAILY   loratadine 10 MG tablet Commonly known as:  CLARITIN Take 10 mg by mouth daily.   meloxicam 15 MG tablet Commonly known as:  MOBIC Take 1 tablet (15 mg total) by mouth daily as needed.   metFORMIN 1000 MG tablet Commonly known as:  GLUCOPHAGE Take 1 tablet (1,000 mg total) by mouth 2 (two) times daily.   NexIUM 20 MG capsule Generic drug:  esomeprazole Take 20 mg by mouth daily.   rosuvastatin 10 MG tablet Commonly known as:  CRESTOR TAKE 1 TABLET BY MOUTH AT BEDTIME   telmisartan-hydrochlorothiazide 80-25 MG tablet Commonly known as:  MICARDIS HCT Take 1 tablet by mouth daily.   timolol 0.5 % ophthalmic solution Commonly known as:  TIMOPTIC INSTILL 1 DROP INTO LEFT EYE TWICE DAILY   Tyler Aas FlexTouch 200 UNIT/ML Sopn Generic drug:  Insulin Degludec INJECT 100 UNITS SUBCUTANEOUSLY ONCE DAILY       Allergies:  Allergies  Allergen Reactions  . Demerol [Meperidine] Other (See Comments) and Anaphylaxis    MAKES BLOOD PRESSURE BOTTOM OUT hypotension  . Dilaudid [Hydromorphone Hcl]   . Hydromorphone     Other reaction(s): Unknown MAKES BLOOD PRESSURE BOTTOM OUT  . Meperidine Hcl   . Actos [Pioglitazone] Other (See Comments) and Hives  . Dapagliflozin Rash  . Penicillins Rash    Family History: Family History  Problem Relation Age of Onset  . Diabetes Mother   . Cataracts Mother   . Metabolic syndrome Mother   . Blindness Mother        r/t diabetes  . Cataracts Father   . Glaucoma Father        Retina-Detachment  . Atrial fibrillation Father   . Diverticulitis Sister   . Glaucoma Brother   . Diabetes Brother   . Cancer Sister        Gallbladder  . Cancer Daughter     Social History:   reports that he has never smoked. He has never used smokeless tobacco. He reports that he does not drink alcohol or use drugs.  ROS: UROLOGY Frequent Urination?: No Hard to postpone urination?: No Burning/pain with urination?: No Get up at night to urinate?: No Leakage of urine?: No Urine stream starts and stops?: No Trouble starting stream?: No Do you have to strain to urinate?: No Blood in urine?: No Urinary tract infection?: No Sexually transmitted disease?: No Injury to kidneys or bladder?: No Painful intercourse?: No Weak stream?: No Erection problems?: No Penile pain?: No  Gastrointestinal Nausea?: No Vomiting?: No Indigestion/heartburn?: No Diarrhea?: No Constipation?: No  Constitutional Fever: No Night sweats?: No Weight loss?: No Fatigue?: No  Skin Skin rash/lesions?: No Itching?: No  Eyes Blurred vision?: No Double vision?: No  Ears/Nose/Throat Sore throat?: No Sinus problems?: No  Hematologic/Lymphatic Swollen glands?: No Easy bruising?: No  Cardiovascular Leg swelling?: No Chest pain?: No  Respiratory Cough?: No Shortness of breath?: No  Endocrine Excessive thirst?: No  Musculoskeletal Back pain?: Yes Joint pain?: No  Neurological Headaches?: No Dizziness?: No  Psychologic Depression?: No Anxiety?: No  Physical Exam: BP (!) 155/80 (BP Location: Left Arm, Patient Position: Sitting, Cuff Size: Normal)   Pulse 69   Ht 6' 3.5" (1.918 m)   Wt 249 lb 1.6 oz (113 kg)   BMI 30.72 kg/m   Constitutional:  Alert and oriented, No acute distress. HEENT: [place] AT, moist mucus membranes.  Trachea midline, no masses. Cardiovascular: No clubbing, cyanosis, or edema. Respiratory: Normal respiratory effort, no increased work of breathing. GU: No CVA tenderness Skin: No rashes, bruises or suspicious lesions. Neurologic: Grossly intact, no focal deficits, moving all 4 extremities. Psychiatric: Normal mood and affect.   Pertinent  Imaging: CT of the abdomen pelvis with and without contrast performed on 07/21/2018 was reviewed.  Right renal cyst are noted which are stable from a prior CT of 2016.  No hydronephrosis, hydroureter or urinary tract calculi are identified.   Assessment & Plan:   83 year old male with right flank pain and CT showing no hydronephrosis or other abnormalities.  He was informed it is unlikely his pain is urologic in etiology.  We discussed possibility of musculoskeletal pain.  He will follow-up as needed.   Abbie Sons, [place] [address], [place], [place] [address] [phone]

## 2018-08-24 DIAGNOSIS — E1142 Type 2 diabetes mellitus with diabetic polyneuropathy: Secondary | ICD-10-CM | POA: Diagnosis not present

## 2018-08-24 DIAGNOSIS — B351 Tinea unguium: Secondary | ICD-10-CM | POA: Diagnosis not present

## 2018-08-24 DIAGNOSIS — K7460 Unspecified cirrhosis of liver: Secondary | ICD-10-CM | POA: Diagnosis not present

## 2018-08-24 LAB — HM DIABETES FOOT EXAM

## 2018-09-17 ENCOUNTER — Other Ambulatory Visit: Payer: Self-pay | Admitting: Family Medicine

## 2018-10-06 ENCOUNTER — Encounter: Payer: Self-pay | Admitting: Family Medicine

## 2018-10-17 ENCOUNTER — Other Ambulatory Visit: Payer: Self-pay | Admitting: Family Medicine

## 2018-10-17 DIAGNOSIS — E782 Mixed hyperlipidemia: Secondary | ICD-10-CM

## 2018-10-26 DIAGNOSIS — H401122 Primary open-angle glaucoma, left eye, moderate stage: Secondary | ICD-10-CM | POA: Diagnosis not present

## 2018-10-26 DIAGNOSIS — M3501 Sicca syndrome with keratoconjunctivitis: Secondary | ICD-10-CM | POA: Diagnosis not present

## 2018-10-26 LAB — HM DIABETES EYE EXAM

## 2018-11-09 ENCOUNTER — Other Ambulatory Visit: Payer: Self-pay | Admitting: Family Medicine

## 2018-11-09 DIAGNOSIS — Z794 Long term (current) use of insulin: Secondary | ICD-10-CM

## 2018-11-09 DIAGNOSIS — E1159 Type 2 diabetes mellitus with other circulatory complications: Secondary | ICD-10-CM

## 2018-11-09 DIAGNOSIS — E1129 Type 2 diabetes mellitus with other diabetic kidney complication: Secondary | ICD-10-CM

## 2018-11-09 DIAGNOSIS — I10 Essential (primary) hypertension: Secondary | ICD-10-CM

## 2018-11-14 ENCOUNTER — Other Ambulatory Visit: Payer: Self-pay | Admitting: Family Medicine

## 2018-11-14 DIAGNOSIS — N400 Enlarged prostate without lower urinary tract symptoms: Secondary | ICD-10-CM

## 2018-11-14 NOTE — Telephone Encounter (Signed)
Refill request for general medication. Proscar to [place]    Last office visit 07/11/2018   Follow up on 11/16/2018

## 2018-11-16 ENCOUNTER — Encounter: Payer: Self-pay | Admitting: Family Medicine

## 2018-11-16 ENCOUNTER — Ambulatory Visit (INDEPENDENT_AMBULATORY_CARE_PROVIDER_SITE_OTHER): Payer: Medicare Other | Admitting: Family Medicine

## 2018-11-16 DIAGNOSIS — E1143 Type 2 diabetes mellitus with diabetic autonomic (poly)neuropathy: Secondary | ICD-10-CM

## 2018-11-16 DIAGNOSIS — R109 Unspecified abdominal pain: Secondary | ICD-10-CM | POA: Diagnosis not present

## 2018-11-16 DIAGNOSIS — I10 Essential (primary) hypertension: Secondary | ICD-10-CM

## 2018-11-16 DIAGNOSIS — N529 Male erectile dysfunction, unspecified: Secondary | ICD-10-CM

## 2018-11-16 DIAGNOSIS — E1159 Type 2 diabetes mellitus with other circulatory complications: Secondary | ICD-10-CM | POA: Diagnosis not present

## 2018-11-16 DIAGNOSIS — Z794 Long term (current) use of insulin: Secondary | ICD-10-CM

## 2018-11-16 DIAGNOSIS — E782 Mixed hyperlipidemia: Secondary | ICD-10-CM | POA: Diagnosis not present

## 2018-11-16 DIAGNOSIS — N400 Enlarged prostate without lower urinary tract symptoms: Secondary | ICD-10-CM | POA: Diagnosis not present

## 2018-11-16 DIAGNOSIS — M7540 Impingement syndrome of unspecified shoulder: Secondary | ICD-10-CM | POA: Insufficient documentation

## 2018-11-16 DIAGNOSIS — R809 Proteinuria, unspecified: Secondary | ICD-10-CM | POA: Diagnosis not present

## 2018-11-16 DIAGNOSIS — E1129 Type 2 diabetes mellitus with other diabetic kidney complication: Secondary | ICD-10-CM | POA: Diagnosis not present

## 2018-11-16 DIAGNOSIS — I700 Atherosclerosis of aorta: Secondary | ICD-10-CM | POA: Diagnosis not present

## 2018-11-16 DIAGNOSIS — M7520 Bicipital tendinitis, unspecified shoulder: Secondary | ICD-10-CM | POA: Insufficient documentation

## 2018-11-16 MED ORDER — TELMISARTAN-HCTZ 80-25 MG PO TABS: 1.0000 | ORAL_TABLET | Freq: Every day | ORAL | 1 refills | Status: DC

## 2018-11-16 MED ORDER — TADALAFIL 20 MG PO TABS: 10.0000 mg | ORAL_TABLET | ORAL | 5 refills | Status: DC | PRN

## 2018-11-16 MED ORDER — ROSUVASTATIN CALCIUM 10 MG PO TABS: 10.0000 mg | ORAL_TABLET | Freq: Every day | ORAL | 1 refills | Status: DC

## 2018-11-16 MED ORDER — BISOPROLOL FUMARATE 10 MG PO TABS: 10.0000 mg | ORAL_TABLET | Freq: Every day | ORAL | 1 refills | Status: DC

## 2018-11-16 MED ORDER — NAPROXEN SODIUM 220 MG PO TABS: 220.0000 mg | ORAL_TABLET | Freq: Every day | ORAL | 0 refills | Status: DC | PRN

## 2018-11-16 NOTE — Progress Notes (Signed)
Name: Jeremy [name]   MRN: [id number]    DOB: 1935/06/25   Date:11/16/2018       Progress Note  Subjective  Chief Complaint  Chief Complaint  Patient presents with  . Diabetes    Seeing Dr. Jinny Blossom Dr. Jihad Ina, MD last month for eye exam and seen Dr. Elvina Mattes for foot exam 08/24/2018   . Medication Refill  . Hypertension    Denies any symptoms  . Hyperlipidemia    I connected with  Sneijder Bernards Robenson on 11/16/18 at  1:20 PM EDT by telephone and verified that I am speaking with the correct person using two identifiers.  I discussed the limitations, risks, security and privacy concerns of performing an evaluation and management service by telephone and the availability of in person appointments. Staff also discussed with the patient that there may be a patient responsible charge related to this service. Patient Location: at home Provider Location: [facility]  HPI  DMII: he was diagnosed in the 35's during a hospital stay for small bowel obstruction. He was on oral medications for a long time, started on insulin a couple of years ago. Currently seeing Endocrinologist Dr. Honor Junes at [facility] . He denies polyphagia, polyuria or polydipsia. He is compliant, up to date with eye exam, foot exam and urine micro. No side effects of medications, he states he has discussed low glucose with Dr. Honor Junes and is now down to 86 units of Tresiba, glucose at home low 100's  He has neuropathy , including ED , he used to take Viagra but would like to try Cialis.  last A1C 6.1%.He is on ARB for microalbuminuria  HTN: he denies chest pain or palpitation. He is not on aspirin because of history of esophagitis and previous diverticulitis with bleeding. Good exercise tolerance and likes working in his yard  Flank pain: he was seen in our office, negative CT, seen by Urologist and given reassurance, likely muscular skeletal and takes aleve and or Tylenol at most once a day for  pain, reminded him of risk of nsaid's use   GERD with esophagitis, sees gastroenterologist and is back on Nexium  He states symptoms are controlled, no regurgitation, heartburn or epigastric pain   Hyperlipidemia and aorta atherosclerosis: no myalgia, reviewed last labs and LDL was at goalat 42. HDL was slightly low.   Esophageal varices: he was seeing EGD and was sent to GI and EGD showed some variceal veins, but mostly on proximal esophagus, he is on low dose  PPI and had multiple studies but negative for cirrhosis so far. Still under the care of GI  BPH: history of TURP and still on finasteride Patient Active Problem List   Diagnosis Date Noted  . Bicipital tenosynovitis 11/16/2018  . Impingement syndrome of shoulder region 11/16/2018  . Atherosclerosis of aorta ([place]) 07/21/2018  . History of diverticulitis of colon 11/15/2017  . Hyperlipidemia due to type 2 diabetes mellitus ([place]) 03/14/2017  . Bilateral lower extremity edema 03/27/2016  . Strain of hamstring muscle 02/21/2016  . Vitamin D deficiency 12/05/2015  . Incomplete emptying of bladder 10/11/2015  . Hx of transient ischemic attack (TIA) 09/17/2015  . Transient vision disturbance of both eyes 08/28/2015  . Stroke-like symptoms 08/28/2015  . Diabetes mellitus type 2 without retinopathy ([place]) 05/31/2015  . Hyperlipidemia 05/30/2015  . Abnormal CT of the chest 05/15/2015  . Abnormal abdominal CT scan 05/15/2015  . Chronic kidney disease (CKD), stage I 01/08/2015  . Type II diabetes  mellitus with renal manifestations, uncontrolled ([place]) 01/08/2015  . Type 2 diabetes, uncontrolled, with neuropathy ([place]) 01/08/2015  . Obesity 01/08/2015  . Proteinuria 01/08/2015  . Atrophy of vocal cord 02/07/2014  . Incisional hernia, without obstruction or gangrene 03/23/2013  . Enlarged prostate with lower urinary tract symptoms (LUTS) 11/03/2012  . Myopic degeneration, bilateral 08/12/2012  . Nonexudative age-related macular  degeneration 07/15/2012  . Anisometropia 04/08/2012  . Pseudoaphakia 04/08/2012  . History of retinal detachment 04/08/2012  . Presence of intraocular lens 04/08/2012  . Bladder diverticulum 02/12/2012  . Diverticulum of bladder 02/12/2012  . Dysphonia 02/08/2012  . Secondary open-angle glaucoma of left eye, severe stage 01/13/2012  . Osteopenia 03/18/2010  . Gastro-esophageal reflux disease with esophagitis 02/07/2008  . Benign essential HTN 02/03/2007  . Hypertension associated with diabetes ([place]) 02/03/2007    Past Surgical History:  Procedure Laterality Date  . APPENDECTOMY    . BLADDER DIVERTICULECTOMY  2013  . COLON SURGERY Left 1980's  . COLONOSCOPY W/ POLYPECTOMY  1990's   Dr Sharlet Salina  . COLONOSCOPY WITH PROPOFOL N/A 12/21/2014   Procedure: COLONOSCOPY WITH PROPOFOL;  Surgeon: Lollie Sails, MD;  Location: [facility] ENDOSCOPY;  Service: Endoscopy;  Laterality: N/A;  . COLONOSCOPY WITH PROPOFOL N/A 08/16/2017   Procedure: COLONOSCOPY WITH PROPOFOL;  Surgeon: Lollie Sails, MD;  Location: [facility] ENDOSCOPY;  Service: Endoscopy;  Laterality: N/A;  . ESOPHAGOGASTRODUODENOSCOPY (EGD) WITH PROPOFOL N/A 02/15/2015   Procedure: ESOPHAGOGASTRODUODENOSCOPY (EGD) WITH PROPOFOL;  Surgeon: Lollie Sails, MD;  Location: [facility] ENDOSCOPY;  Service: Endoscopy;  Laterality: N/A;  . ESOPHAGOGASTRODUODENOSCOPY (EGD) WITH PROPOFOL N/A 06/10/2017   Procedure: ESOPHAGOGASTRODUODENOSCOPY (EGD) WITH PROPOFOL;  Surgeon: Lollie Sails, MD;  Location: [facility] ENDOSCOPY;  Service: Endoscopy;  Laterality: N/A;  . ESOPHAGOGASTRODUODENOSCOPY (EGD) WITH PROPOFOL N/A 08/16/2017   Procedure: ESOPHAGOGASTRODUODENOSCOPY (EGD) WITH PROPOFOL;  Surgeon: Lollie Sails, MD;  Location: [facility] ENDOSCOPY;  Service: Endoscopy;  Laterality: N/A;  . EYE SURGERY    . GLAUCOMA SURGERY Left [place]     [place], Dr Sharlet Salina  . INGUINAL HERNIA REPAIR  Bilateral 1960's  . LARYNX SURGERY  1990   3 times  . RETINAL DETACHMENT SURGERY Right   . TONSILLECTOMY    . turp  2013  . URETER SURGERY  2013   Dr Jacqlyn Larsen    Family History  Problem Relation Age of Onset  . Diabetes Mother   . Cataracts Mother   . Metabolic syndrome Mother   . Blindness Mother        r/t diabetes  . Cataracts Father   . Glaucoma Father        Retina-Detachment  . Atrial fibrillation Father   . Diverticulitis Sister   . Glaucoma Brother   . Diabetes Brother   . Cancer Sister        Gallbladder  . Cancer Daughter     Social History   Socioeconomic History  . Marital status: Married    Spouse name: Pamala Hurry   . Number of children: 3  . Years of education: Not on file  . Highest education level: Professional school degree (e.g., MD, DDS, DVM, JD)  Occupational History  . Occupation: Industrial/product designer     Comment: still working - Engineer, maintenance as a Engineer, maintenance  . Financial resource strain: Not hard at all  . Food insecurity:    Worry: Never true    Inability:  Never true  . Transportation needs:    Medical: No    Non-medical: No  Tobacco Use  . Smoking status: Never Smoker  . Smokeless tobacco: Never Used  . Tobacco comment: smoking cessation materials not required  Substance and Sexual Activity  . Alcohol use: No    Frequency: Never  . Drug use: No  . Sexual activity: Not Currently  Lifestyle  . Physical activity:    Days per week: 2 days    Minutes per session: 60 min  . Stress: Not at all  Relationships  . Social connections:    Talks on phone: More than three times a week    Gets together: Once a week    Attends religious service: 1 to 4 times per year    Active member of club or organization: Yes    Attends meetings of clubs or organizations: Never    Relationship status: Married  . Intimate partner violence:    Fear of current or ex partner: No    Emotionally abused: No    Physically abused: No    Forced sexual activity:  No  Other Topics Concern  . Not on file  Social History Narrative   Married , still works as a Engineer, drilling Environmental consultant)   Three children ( one died in his 72's with colon cancer) , one lives in [place] and the other one in [place]   His siblings are in [place] and [place]     Current Outpatient Medications:  .  bisoprolol (ZEBETA) 10 MG tablet, Take 1 tablet (10 mg total) by mouth daily., Disp: 90 tablet, Rfl: 1 .  brimonidine (ALPHAGAN P) 0.1 % SOLN, Place 1 drop into the left eye 2 (two) times daily. , Disp: , Rfl:  .  BYDUREON 2 MG PEN, INJECT 2 MG SUBCUTANEOUSLY ONCE A WEEK., Disp: 4 each, Rfl: 2 .  dorzolamide (TRUSOPT) 2 % ophthalmic solution, Place 1 drop into the left eye 2 (two) times daily., Disp: , Rfl: 3 .  esomeprazole (NEXIUM) 20 MG capsule, Take 20 mg by mouth daily., Disp: , Rfl:  .  finasteride (PROSCAR) 5 MG tablet, Take 1 tablet by mouth once daily, Disp: 90 tablet, Rfl: 0 .  INVOKANA 300 MG TABS tablet, TAKE ONE TABLET BY MOUTH ONCE DAILY, Disp: 90 tablet, Rfl: 0 .  loratadine (CLARITIN) 10 MG tablet, Take 10 mg by mouth daily., Disp: , Rfl:  .  metFORMIN (GLUCOPHAGE) 1000 MG tablet, Take 1 tablet (1,000 mg total) by mouth 2 (two) times daily., Disp: 180 tablet, Rfl: 1 .  rosuvastatin (CRESTOR) 10 MG tablet, Take 1 tablet (10 mg total) by mouth at bedtime., Disp: 90 tablet, Rfl: 1 .  telmisartan-hydrochlorothiazide (MICARDIS HCT) 80-25 MG tablet, Take 1 tablet by mouth daily., Disp: 90 tablet, Rfl: 1 .  timolol (TIMOPTIC) 0.5 % ophthalmic solution, INSTILL 1 DROP INTO LEFT EYE TWICE DAILY, Disp: , Rfl:  .  TRESIBA FLEXTOUCH 200 UNIT/ML SOPN, Inject 86 Units into the skin daily., Disp: , Rfl:  .  naproxen sodium (ALEVE) 220 MG tablet, Take 1 tablet (220 mg total) by mouth daily as needed., Disp: 30 tablet, Rfl: 0 .  tadalafil (CIALIS) 20 MG tablet, Take 0.5-1 tablets (10-20 mg total) by mouth every other day as needed for erectile dysfunction., Disp: 5 tablet, Rfl: 5  Allergies   Allergen Reactions  . Demerol [Meperidine] Other (See Comments) and Anaphylaxis    MAKES BLOOD PRESSURE BOTTOM OUT hypotension  . Dilaudid [Hydromorphone Hcl]   .  Hydromorphone     Other reaction(s): Unknown MAKES BLOOD PRESSURE BOTTOM OUT  . Meperidine Hcl   . Actos [Pioglitazone] Other (See Comments) and Hives  . Dapagliflozin Rash  . Penicillins Rash    I personally reviewed active problem list, medication list, allergies, family history, social history with the patient/caregiver today.   ROS  Ten systems reviewed and is negative except as mentioned in HPI   Objective  Virtual encounter, vitals not obtained.  There is no height or weight on file to calculate BMI.  Physical Exam  Awake, alert and oriented   PHQ2/9: Depression screen [facility] 2/9 11/16/2018 05/17/2018 02/25/2018 11/15/2017 07/12/2017  Decreased Interest 0 0 0 0 0  Down, Depressed, Hopeless 0 0 0 0 0  PHQ - 2 Score 0 0 0 0 0  Altered sleeping 0 0 0 - -  Tired, decreased energy 0 0 0 - -  Change in appetite 0 0 0 - -  Feeling bad or failure about yourself  0 0 0 - -  Trouble concentrating 0 0 0 - -  Moving slowly or fidgety/restless 0 0 0 - -  Suicidal thoughts 0 0 0 - -  PHQ-9 Score 0 0 0 - -  Difficult doing work/chores Not difficult at all Not difficult at all Not difficult at all - -   PHQ-2/9 Result is negative.    Fall Risk: Fall Risk  11/16/2018 07/11/2018 05/17/2018 02/25/2018 11/15/2017  Falls in the past year? 0 0 0 No No  Number falls in past yr: 0 0 0 - -  Injury with Fall? 0 0 0 - -  Risk for fall due to : - - - Impaired vision -  Risk for fall due to: Comment - - - wears eyeglasses -    Assessment & Plan  1. Controlled type 2 diabetes mellitus with diabetic autonomic neuropathy, with long-term current use of insulin (HCC)  Under the care of Dr. Honor Junes and has follow up next week   2. Benign essential HTN  - telmisartan-hydrochlorothiazide (MICARDIS HCT) 80-25 MG tablet; Take 1 tablet by  mouth daily.  Dispense: 90 tablet; Refill: 1 - bisoprolol (ZEBETA) 10 MG tablet; Take 1 tablet (10 mg total) by mouth daily.  Dispense: 90 tablet; Refill: 1  3. Hypertension associated with diabetes ([place])  - telmisartan-hydrochlorothiazide (MICARDIS HCT) 80-25 MG tablet; Take 1 tablet by mouth daily.  Dispense: 90 tablet; Refill: 1  4. Type 2 diabetes mellitus with microalbuminuria, with long-term current use of insulin (HCC)  - telmisartan-hydrochlorothiazide (MICARDIS HCT) 80-25 MG tablet; Take 1 tablet by mouth daily.  Dispense: 90 tablet; Refill: 1  5. Mixed hyperlipidemia  - rosuvastatin (CRESTOR) 10 MG tablet; Take 1 tablet (10 mg total) by mouth at bedtime.  Dispense: 90 tablet; Refill: 1  6. Benign prostatic hyperplasia without lower urinary tract symptoms  S/p TURP - tadalafil (CIALIS) 20 MG tablet; Take 0.5-1 tablets (10-20 mg total) by mouth every other day as needed for erectile dysfunction.  Dispense: 5 tablet; Refill: 5  7. Right flank pain  Negative CT , seen by urologist, doing well on prn naproxen, reminded him of risk of long term nsaid's use, he will try to take Tylenol instead  - naproxen sodium (ALEVE) 220 MG tablet; Take 1 tablet (220 mg total) by mouth daily as needed.  Dispense: 30 tablet; Refill: 0  8. Atherosclerosis of aorta ([place])  On statin therapy   9. ED (erectile dysfunction) of organic origin  - tadalafil (  CIALIS) 20 MG tablet; Take 0.5-1 tablets (10-20 mg total) by mouth every other day as needed for erectile dysfunction.  Dispense: 5 tablet; Refill: 5  I discussed the assessment and treatment plan with the patient. The patient was provided an opportunity to ask questions and all were answered. The patient agreed with the plan and demonstrated an understanding of the instructions.   The patient was advised to call back or seek an in-person evaluation if the symptoms worsen or if the condition fails to improve as anticipated.  I provided 25 minutes  of non-face-to-face time during this encounter.  Loistine Chance, MD

## 2018-11-28 DIAGNOSIS — B351 Tinea unguium: Secondary | ICD-10-CM | POA: Diagnosis not present

## 2018-11-28 DIAGNOSIS — E1142 Type 2 diabetes mellitus with diabetic polyneuropathy: Secondary | ICD-10-CM | POA: Diagnosis not present

## 2018-11-30 DIAGNOSIS — E1142 Type 2 diabetes mellitus with diabetic polyneuropathy: Secondary | ICD-10-CM | POA: Diagnosis not present

## 2018-11-30 DIAGNOSIS — E1159 Type 2 diabetes mellitus with other circulatory complications: Secondary | ICD-10-CM | POA: Diagnosis not present

## 2018-11-30 DIAGNOSIS — E785 Hyperlipidemia, unspecified: Secondary | ICD-10-CM | POA: Diagnosis not present

## 2018-11-30 DIAGNOSIS — Z794 Long term (current) use of insulin: Secondary | ICD-10-CM | POA: Diagnosis not present

## 2018-11-30 DIAGNOSIS — I10 Essential (primary) hypertension: Secondary | ICD-10-CM | POA: Diagnosis not present

## 2018-11-30 DIAGNOSIS — E119 Type 2 diabetes mellitus without complications: Secondary | ICD-10-CM | POA: Diagnosis not present

## 2018-11-30 DIAGNOSIS — E1169 Type 2 diabetes mellitus with other specified complication: Secondary | ICD-10-CM | POA: Diagnosis not present

## 2019-01-04 DIAGNOSIS — E785 Hyperlipidemia, unspecified: Secondary | ICD-10-CM | POA: Diagnosis not present

## 2019-01-04 DIAGNOSIS — I10 Essential (primary) hypertension: Secondary | ICD-10-CM | POA: Diagnosis not present

## 2019-01-04 DIAGNOSIS — R079 Chest pain, unspecified: Secondary | ICD-10-CM | POA: Diagnosis not present

## 2019-01-04 DIAGNOSIS — E1169 Type 2 diabetes mellitus with other specified complication: Secondary | ICD-10-CM | POA: Diagnosis not present

## 2019-01-04 DIAGNOSIS — Z8673 Personal history of transient ischemic attack (TIA), and cerebral infarction without residual deficits: Secondary | ICD-10-CM | POA: Diagnosis not present

## 2019-01-04 DIAGNOSIS — Z794 Long term (current) use of insulin: Secondary | ICD-10-CM | POA: Diagnosis not present

## 2019-01-04 DIAGNOSIS — E1159 Type 2 diabetes mellitus with other circulatory complications: Secondary | ICD-10-CM | POA: Diagnosis not present

## 2019-01-04 DIAGNOSIS — E1142 Type 2 diabetes mellitus with diabetic polyneuropathy: Secondary | ICD-10-CM | POA: Diagnosis not present

## 2019-02-01 ENCOUNTER — Other Ambulatory Visit: Payer: Self-pay | Admitting: Gastroenterology

## 2019-02-01 ENCOUNTER — Other Ambulatory Visit (HOSPITAL_COMMUNITY): Payer: Self-pay | Admitting: Gastroenterology

## 2019-02-01 DIAGNOSIS — K7460 Unspecified cirrhosis of liver: Secondary | ICD-10-CM

## 2019-02-08 ENCOUNTER — Other Ambulatory Visit: Payer: Self-pay

## 2019-02-08 ENCOUNTER — Ambulatory Visit
Admission: RE | Admit: 2019-02-08 | Discharge: 2019-02-08 | Disposition: A | Discharge status: Home / Self Care | Payer: Medicare Other | Source: Ambulatory Visit | Attending: Gastroenterology | Admitting: Gastroenterology

## 2019-02-08 DIAGNOSIS — K7460 Unspecified cirrhosis of liver: Secondary | ICD-10-CM | POA: Diagnosis not present

## 2019-02-08 DIAGNOSIS — N281 Cyst of kidney, acquired: Secondary | ICD-10-CM | POA: Diagnosis not present

## 2019-02-20 ENCOUNTER — Other Ambulatory Visit: Payer: Self-pay | Admitting: Family Medicine

## 2019-02-20 DIAGNOSIS — N400 Enlarged prostate without lower urinary tract symptoms: Secondary | ICD-10-CM

## 2019-02-27 DIAGNOSIS — E1142 Type 2 diabetes mellitus with diabetic polyneuropathy: Secondary | ICD-10-CM | POA: Diagnosis not present

## 2019-02-27 DIAGNOSIS — B351 Tinea unguium: Secondary | ICD-10-CM | POA: Diagnosis not present

## 2019-02-28 ENCOUNTER — Other Ambulatory Visit: Payer: Self-pay

## 2019-02-28 ENCOUNTER — Ambulatory Visit (INDEPENDENT_AMBULATORY_CARE_PROVIDER_SITE_OTHER): Payer: Medicare Other

## 2019-02-28 VITALS — BP 114/62 | HR 63 | Temp 97.1°F | Resp 15 | Ht 76.0 in | Wt 251.5 lb

## 2019-02-28 DIAGNOSIS — Z23 Encounter for immunization: Secondary | ICD-10-CM | POA: Diagnosis not present

## 2019-02-28 DIAGNOSIS — Z0000 Encounter for general adult medical examination without abnormal findings: Secondary | ICD-10-CM | POA: Diagnosis not present

## 2019-02-28 NOTE — Patient Instructions (Signed)
Jeremy [name] , Thank you for taking time to come for your Medicare Wellness Visit. I appreciate your ongoing commitment to your health goals. Please review the following plan we discussed and let me know if I can assist you in the future.   Screening recommendations/referrals: Colonoscopy: 08/16/17. Repeat every 3 years.  Recommended yearly ophthalmology/optometry visit for glaucoma screening and checkup Recommended yearly dental visit for hygiene and checkup  Vaccinations: Influenza vaccine: done today Pneumococcal vaccine: done 09/04/13 Tdap vaccine: done 04/04/12 Shingles vaccine: Shingrix series completed 02/21/18    Conditions/risks identified: Keep up the great work!  Next appointment: Please follow up in one year for your Medicare Annual Wellness visit.    Preventive Care 45 Years and Older, Male Preventive care refers to lifestyle choices and visits with your health care provider that can promote health and wellness. What does preventive care include?  A yearly physical exam. This is also called an annual well check.  Dental exams once or twice a year.  Routine eye exams. Ask your health care provider how often you should have your eyes checked.  Personal lifestyle choices, including:  Daily care of your teeth and gums.  Regular physical activity.  Eating a healthy diet.  Avoiding tobacco and drug use.  Limiting alcohol use.  Practicing safe sex.  Taking low doses of aspirin every day.  Taking vitamin and mineral supplements as recommended by your health care provider. What happens during an annual well check? The services and screenings done by your health care provider during your annual well check will depend on your age, overall health, lifestyle risk factors, and family history of disease. Counseling  Your health care provider may ask you questions about your:  Alcohol use.  Tobacco use.  Drug use.  Emotional well-being.  Home and relationship  well-being.  Sexual activity.  Eating habits.  History of falls.  Memory and ability to understand (cognition).  Work and work Statistician. Screening  You may have the following tests or measurements:  Height, weight, and BMI.  Blood pressure.  Lipid and cholesterol levels. These may be checked every 5 years, or more frequently if you are over 5 years old.  Skin check.  Lung cancer screening. You may have this screening every year starting at age 66 if you have a 30-pack-year history of smoking and currently smoke or have quit within the past 15 years.  Fecal occult blood test (FOBT) of the stool. You may have this test every year starting at age 20.  Flexible sigmoidoscopy or colonoscopy. You may have a sigmoidoscopy every 5 years or a colonoscopy every 10 years starting at age 33.  Prostate cancer screening. Recommendations will vary depending on your family history and other risks.  Hepatitis C blood test.  Hepatitis B blood test.  Sexually transmitted disease (STD) testing.  Diabetes screening. This is done by checking your blood sugar (glucose) after you have not eaten for a while (fasting). You may have this done every 1-3 years.  Abdominal aortic aneurysm (AAA) screening. You may need this if you are a current or former smoker.  Osteoporosis. You may be screened starting at age 46 if you are at high risk. Talk with your health care provider about your test results, treatment options, and if necessary, the need for more tests. Vaccines  Your health care provider may recommend certain vaccines, such as:  Influenza vaccine. This is recommended every year.  Tetanus, diphtheria, and acellular pertussis (Tdap, Td) vaccine. You may need  a Td booster every 10 years.  Zoster vaccine. You may need this after age 34.  Pneumococcal 13-valent conjugate (PCV13) vaccine. One dose is recommended after age 27.  Pneumococcal polysaccharide (PPSV23) vaccine. One dose is  recommended after age 56. Talk to your health care provider about which screenings and vaccines you need and how often you need them. This information is not intended to replace advice given to you by your health care provider. Make sure you discuss any questions you have with your health care provider. Document Released: 06/28/2015 Document Revised: 02/19/2016 Document Reviewed: 04/02/2015 Elsevier Interactive Patient Education  2017 [place] Prevention in the Home Falls can cause injuries. They can happen to people of all ages. There are many things you can do to make your home safe and to help prevent falls. What can I do on the outside of my home?  Regularly fix the edges of walkways and driveways and fix any cracks.  Remove anything that might make you trip as you walk through a door, such as a raised step or threshold.  Trim any bushes or trees on the path to your home.  Use bright outdoor lighting.  Clear any walking paths of anything that might make someone trip, such as rocks or tools.  Regularly check to see if handrails are loose or broken. Make sure that both sides of any steps have handrails.  Any raised decks and porches should have guardrails on the edges.  Have any leaves, snow, or ice cleared regularly.  Use sand or salt on walking paths during winter.  Clean up any spills in your garage right away. This includes oil or grease spills. What can I do in the bathroom?  Use night lights.  Install grab bars by the toilet and in the tub and shower. Do not use towel bars as grab bars.  Use non-skid mats or decals in the tub or shower.  If you need to sit down in the shower, use a plastic, non-slip stool.  Keep the floor dry. Clean up any water that spills on the floor as soon as it happens.  Remove soap buildup in the tub or shower regularly.  Attach bath mats securely with double-sided non-slip rug tape.  Do not have throw rugs and other things on  the floor that can make you trip. What can I do in the bedroom?  Use night lights.  Make sure that you have a light by your bed that is easy to reach.  Do not use any sheets or blankets that are too big for your bed. They should not hang down onto the floor.  Have a firm chair that has side arms. You can use this for support while you get dressed.  Do not have throw rugs and other things on the floor that can make you trip. What can I do in the kitchen?  Clean up any spills right away.  Avoid walking on wet floors.  Keep items that you use a lot in easy-to-reach places.  If you need to reach something above you, use a strong step stool that has a grab bar.  Keep electrical cords out of the way.  Do not use floor polish or wax that makes floors slippery. If you must use wax, use non-skid floor wax.  Do not have throw rugs and other things on the floor that can make you trip. What can I do with my stairs?  Do not leave any items on the  stairs.  Make sure that there are handrails on both sides of the stairs and use them. Fix handrails that are broken or loose. Make sure that handrails are as long as the stairways.  Check any carpeting to make sure that it is firmly attached to the stairs. Fix any carpet that is loose or worn.  Avoid having throw rugs at the top or bottom of the stairs. If you do have throw rugs, attach them to the floor with carpet tape.  Make sure that you have a light switch at the top of the stairs and the bottom of the stairs. If you do not have them, ask someone to add them for you. What else can I do to help prevent falls?  Wear shoes that:  Do not have high heels.  Have rubber bottoms.  Are comfortable and fit you well.  Are closed at the toe. Do not wear sandals.  If you use a stepladder:  Make sure that it is fully opened. Do not climb a closed stepladder.  Make sure that both sides of the stepladder are locked into place.  Ask someone to  hold it for you, if possible.  Clearly mark and make sure that you can see:  Any grab bars or handrails.  First and last steps.  Where the edge of each step is.  Use tools that help you move around (mobility aids) if they are needed. These include:  Canes.  Walkers.  Scooters.  Crutches.  Turn on the lights when you go into a dark area. Replace any light bulbs as soon as they burn out.  Set up your furniture so you have a clear path. Avoid moving your furniture around.  If any of your floors are uneven, fix them.  If there are any pets around you, be aware of where they are.  Review your medicines with your doctor. Some medicines can make you feel dizzy. This can increase your chance of falling. Ask your doctor what other things that you can do to help prevent falls. This information is not intended to replace advice given to you by your health care provider. Make sure you discuss any questions you have with your health care provider. Document Released: 03/28/2009 Document Revised: 11/07/2015 Document Reviewed: 07/06/2014 Elsevier Interactive Patient Education  2017 [employer].

## 2019-02-28 NOTE — Progress Notes (Signed)
Subjective:   Jeremy [name] is a 83 y.o. male who presents for Medicare Annual/Subsequent preventive examination.  Review of Systems:   Cardiac Risk Factors include: advanced age (>52men, >64 women);diabetes mellitus;dyslipidemia;male gender;hypertension;obesity (BMI >30kg/m2)     Objective:    Vitals: BP 114/62 (BP Location: Left Arm, Patient Position: Sitting, Cuff Size: Normal)   Pulse 63   Temp (!) 97.1 F (36.2 C) (Temporal)   Resp 15   Ht 6\' 4"  (1.93 m)   Wt 251 lb 8 oz (114.1 kg)   SpO2 93%   BMI 30.61 kg/m   Body mass index is 30.61 kg/m.  Advanced Directives 02/28/2019 02/25/2018 08/16/2017 04/08/2017 04/08/2017 02/22/2017 01/04/2017  Does Patient Have a Medical Advance Directive? Yes Yes Yes Yes Yes Yes No  Type of Paramedic of [place];Living will [place];Living will [place];Living will [place];Living will [place];Living will [place];Living will -  Does patient want to make changes to medical advance directive? - - - - - - -  Copy of [place] in Chart? Yes - validated most recent copy scanned in chart (See row information) Yes No - copy requested No - copy requested No - copy requested No - copy requested -  Would patient like information on creating a medical advance directive? - - - - - - -    Tobacco Social History   Tobacco Use  Smoking Status Never Smoker  Smokeless Tobacco Never Used  Tobacco Comment   smoking cessation materials not required     Counseling given: Not Answered Comment: smoking cessation materials not required   Clinical Intake:  Pre-visit preparation completed: Yes  Pain : No/denies pain     BMI - recorded: 30.61 Nutritional Status: BMI > 30  Obese Nutritional Risks: None Diabetes: Yes CBG done?: No Did pt. bring in CBG monitor from home?: No   Nutrition Risk Assessment:  Has  the patient had any N/V/D within the last 2 months?  No  Does the patient have any non-healing wounds?  No  Has the patient had any unintentional weight loss or weight gain?  No   Diabetes:  Is the patient diabetic?  Yes  If diabetic, was a CBG obtained today?  No  Did the patient bring in their glucometer from home?  No  How often do you monitor your CBG's? daily.   Financial Strains and Diabetes Management:  Are you having any financial strains with the device, your supplies or your medication? No .  Does the patient want to be seen by Chronic Care Management for management of their diabetes?  No  Would the patient like to be referred to a Nutritionist or for Diabetic Management?  No   Diabetic Exams:  Diabetic Eye Exam: Completed 10/26/18.   Diabetic Foot Exam: Completed 08/24/18.  How often do you need to have someone help you when you read instructions, pamphlets, or other written materials from your doctor or pharmacy?: 1 - Never  Interpreter Needed?: No  Information entered by :: Jeremy Marker LPN  Past Medical History:  Diagnosis Date  . Allergy   . Chronic kidney disease   . Diabetes mellitus without complication ([place])   . Diabetic neuropathy ([place])   . Diverticula, colon 1980's  . Diverticulosis   . Enlarged prostate   . GERD (gastroesophageal reflux disease)   . Glaucoma   . Hemorrhoids   . High  cholesterol   . History of hiatal hernia   . Hyperlipidemia   . Hypertension   . Myocardial infarction ([place])   . Obesity   . Onychomycosis   . Seizures ([place])   . Small bowel obstruction ([place]) 1990's  . Urination disorder    Past Surgical History:  Procedure Laterality Date  . APPENDECTOMY    . BLADDER DIVERTICULECTOMY  2013  . COLON SURGERY Left 1980's  . COLONOSCOPY W/ POLYPECTOMY  1990's   Dr Sharlet Salina  . COLONOSCOPY WITH PROPOFOL N/A 12/21/2014   Procedure: COLONOSCOPY WITH PROPOFOL;  Surgeon: Lollie Sails, MD;  Location: [facility] ENDOSCOPY;  Service:  Endoscopy;  Laterality: N/A;  . COLONOSCOPY WITH PROPOFOL N/A 08/16/2017   Procedure: COLONOSCOPY WITH PROPOFOL;  Surgeon: Lollie Sails, MD;  Location[facility] ENDOSCOPY;  Service: Endoscopy;  Laterality: N/A;  . ESOPHAGOGASTRODUODENOSCOPY (EGD) WITH PROPOFOL N/A 02/15/2015   Procedure: ESOPHAGOGASTRODUODENOSCOPY (EGD) WITH PROPOFOL;  Surgeon: Lollie Sails, MD;  Location: [facility] ENDOSCOPY;  Service: Endoscopy;  Laterality: N/A;  . ESOPHAGOGASTRODUODENOSCOPY (EGD) WITH PROPOFOL N/A 06/10/2017   Procedure: ESOPHAGOGASTRODUODENOSCOPY (EGD) WITH PROPOFOL;  Surgeon: Lollie Sails, MD;  Location[facility] ENDOSCOPY;  Service: Endoscopy;  Laterality: N/A;  . ESOPHAGOGASTRODUODENOSCOPY (EGD) WITH PROPOFOL N/A 08/16/2017   Procedure: ESOPHAGOGASTRODUODENOSCOPY (EGD) WITH PROPOFOL;  Surgeon: Lollie Sails, MD;  Location: [facility] ENDOSCOPY;  Service: Endoscopy;  Laterality: N/A;  . EYE SURGERY    . GLAUCOMA SURGERY Left [place]     [place], Dr Sharlet Salina  . INGUINAL HERNIA REPAIR Bilateral 1960's  . LARYNX SURGERY  1990   3 times  . RETINAL DETACHMENT SURGERY Right   . TONSILLECTOMY    . turp  2013  . URETER SURGERY  2013   Dr Jacqlyn Larsen   Family History  Problem Relation Age of Onset  . Diabetes Mother   . Cataracts Mother   . Metabolic syndrome Mother   . Blindness Mother        r/t diabetes  . Cataracts Father   . Glaucoma Father        Retina-Detachment  . Atrial fibrillation Father   . Diverticulitis Sister   . Glaucoma Brother   . Diabetes Brother   . Cancer Sister        Gallbladder  . Cancer Daughter    Social History   Socioeconomic History  . Marital status: Married    Spouse name: Jeremy [name]   . Number of children: 3  . Years of education: Not on file  . Highest education level: Professional school degree (e.g., MD, DDS, DVM, JD)  Occupational History  . Occupation: Industrial/product designer     Comment: still  working - Engineer, maintenance as a Engineer, maintenance  . Financial resource strain: Not hard at all  . Food insecurity    Worry: Never true    Inability: Never true  . Transportation needs    Medical: No    Non-medical: No  Tobacco Use  . Smoking status: Never Smoker  . Smokeless tobacco: Never Used  . Tobacco comment: smoking cessation materials not required  Substance and Sexual Activity  . Alcohol use: No    Frequency: Never  . Drug use: No  . Sexual activity: Yes  Lifestyle  . Physical activity    Days per week: 2 days    Minutes per session: 60 min  . Stress: Not at all  Relationships  . Social connections    Talks on phone: More than three times a week    Gets together: Once a week    Attends religious service: More than 4 times per year    Active member of club or organization: Yes    Attends meetings of clubs or organizations: Never    Relationship status: Married  Other Topics Concern  . Not on file  Social History Narrative   Married , still works as a Engineer, drilling Environmental consultant)   Three children ( one died in his 43's with colon cancer) , one lives in [place] and the other one in [place]   His siblings are in [place] and [place]    Outpatient Encounter Medications as of 02/28/2019  Medication Sig  . bisoprolol (ZEBETA) 10 MG tablet Take 1 tablet (10 mg total) by mouth daily.  . brimonidine (ALPHAGAN P) 0.1 % SOLN Place 1 drop into the left eye 2 (two) times daily.   Marland Kitchen BYDUREON 2 MG PEN INJECT 2 MG SUBCUTANEOUSLY ONCE A WEEK.  . dorzolamide (TRUSOPT) 2 % ophthalmic solution Place 1 drop into the left eye 2 (two) times daily.  Marland Kitchen esomeprazole (NEXIUM) 20 MG capsule Take 20 mg by mouth daily.  . finasteride (PROSCAR) 5 MG tablet Take 1 tablet by mouth once daily  . INVOKANA 300 MG TABS tablet TAKE ONE TABLET BY MOUTH ONCE DAILY  . loratadine (CLARITIN) 10 MG tablet Take 10 mg by mouth daily.  . meloxicam (MOBIC) 15 MG tablet Take 15 mg by mouth daily. Takes PRN  . metFORMIN  (GLUCOPHAGE) 1000 MG tablet Take 1 tablet (1,000 mg total) by mouth 2 (two) times daily.  . naproxen sodium (ALEVE) 220 MG tablet Take 1 tablet (220 mg total) by mouth daily as needed.  . rosuvastatin (CRESTOR) 10 MG tablet Take 1 tablet (10 mg total) by mouth at bedtime.  . tadalafil (CIALIS) 20 MG tablet Take 0.5-1 tablets (10-20 mg total) by mouth every other day as needed for erectile dysfunction.  Marland Kitchen telmisartan-hydrochlorothiazide (MICARDIS HCT) 80-25 MG tablet Take 1 tablet by mouth daily.  . timolol (TIMOPTIC) 0.5 % ophthalmic solution INSTILL 1 DROP INTO LEFT EYE TWICE DAILY  . TRESIBA FLEXTOUCH 200 UNIT/ML SOPN Inject 86 Units into the skin daily.   No facility-administered encounter medications on file as of 02/28/2019.     Activities of Daily Living In your present state of health, do you have any difficulty performing the following activities: 02/28/2019 11/16/2018  Hearing? N Y  Comment declines hearing aids -  Vision? N Y  Difficulty concentrating or making decisions? N N  Walking or climbing stairs? N N  Dressing or bathing? N N  Doing errands, shopping? N N  Preparing Food and eating ? N -  Using the Toilet? N -  In the past six months, have you accidently leaked urine? N -  Do you have problems with loss of bowel control? N -  Managing your Medications? N -  Managing your Finances? N -  Housekeeping or managing your Housekeeping? N -  Some recent data might be hidden    Patient Care Team: Steele Sizer, MD as PCP - General (Family Medicine) Requested, Self Birder Robson, MD as Consulting Physician (Ophthalmology) Earnestine Leys, MD as Consulting Physician (Orthopedic Surgery) Troxler, Rodman Key, DPM as Consulting Physician (Podiatry) Lollie Sails, MD as Consulting Physician (Gastroenterology) Isaias Cowman, MD as Consulting Physician (Cardiology) Lonia Farber, MD as Consulting Physician (Internal Medicine)  Assessment:   This is a routine  wellness examination for Jeremy [name].  Exercise Activities and Dietary recommendations Current Exercise Habits: Home exercise routine, Type of exercise: Other - see comments(yard work), Time (Minutes): 60, Frequency (Times/Week): 2, Weekly Exercise (Minutes/Week): 120, Intensity: Moderate, Exercise limited by: None identified  Goals    . Increase water intake     Recommend increasing water intake to 6 glasses of water a day.     . Prevent falls     Recommend to remove any items from the home that may cause slips or trips.       Fall Risk Fall Risk  02/28/2019 11/16/2018 07/11/2018 05/17/2018 02/25/2018  Falls in the past year? 0 0 0 0 No  Number falls in past yr: 0 0 0 0 -  Injury with Fall? 0 0 0 0 -  Risk for fall due to : - - - - Impaired vision  Risk for fall due to: Comment - - - - wears eyeglasses   FALL RISK PREVENTION PERTAINING TO THE HOME:  Any stairs in or around the home? No  If so, do they handrails? No   Home free of loose throw rugs in walkways, pet beds, electrical cords, etc? Yes  Adequate lighting in your home to reduce risk of falls? Yes   ASSISTIVE DEVICES UTILIZED TO PREVENT FALLS:  Life alert? No  Use of a cane, walker or w/c? No  Grab bars in the bathroom? Yes  Shower chair or bench in shower? No  Elevated toilet seat or a handicapped toilet? Yes   DME ORDERS:  DME order needed?  No   TIMED UP AND GO:  Was the test performed? Yes .  Length of time to ambulate 10 feet: 6 sec.   GAIT:  Appearance of gait: Gait stead-fast and without the use of an assistive device.  Education: Fall risk prevention has been discussed.  Intervention(s) required? No   Depression Screen PHQ 2/9 Scores 02/28/2019 11/16/2018 05/17/2018 02/25/2018  PHQ - 2 Score 0 0 0 0  PHQ- 9 Score - 0 0 0    Cognitive Function     6CIT Screen 02/28/2019 02/25/2018  What Year? 0 points 0 points  What month? 0 points 0 points  What time? 0 points 0 points  Count back from 20 0 points 0  points  Months in reverse 0 points 0 points  Repeat phrase 0 points 4 points  Total Score 0 4    Immunization History  Administered Date(s) Administered  . Fluad Quad(high Dose 65+) 02/28/2019  . Influenza, High Dose Seasonal PF 03/12/2016, 02/22/2017, 02/25/2018  . Influenza,inj,Quad PF,6+ Mos 03/28/2015  . Pneumococcal Conjugate-13 08/07/2013, 09/04/2013  . Pneumococcal Polysaccharide-23 04/04/2012  . Tdap 04/04/2012  . Zoster 09/17/2010  . Zoster Recombinat (Shingrix) 12/14/2017, 02/21/2018    Qualifies for Shingles Vaccine? Yes  Shingrix series completed  Tdap: Up to date  Flu Vaccine: Due for Flu vaccine. Does the patient want to receive this vaccine today?  Yes . Education has been provided regarding the importance of this vaccine but still declined. Advised may receive this vaccine at local pharmacy or Health Dept. Aware to provide a copy of the vaccination record if obtained from local pharmacy or Health Dept. Verbalized acceptance and understanding.  Pneumococcal Vaccine: Up to date   Screening Tests Health Maintenance  Topic Date Due  . INFLUENZA VACCINE  01/14/2019  . HEMOGLOBIN A1C  06/01/2019  . FOOT EXAM  08/24/2019  . OPHTHALMOLOGY EXAM  10/26/2019  . TETANUS/TDAP  04/04/2022  . PNA vac Low Risk Adult  Completed   Cancer Screenings:  Colorectal Screening:  Done 08/16/17. Repeat every 3 years.   Lung Cancer Screening: (Low Dose CT Chest recommended if Age 45-80 years, 30 pack-year currently smoking OR have quit w/in 15years.) does not qualify.   Additional Screening:  Hepatitis C Screening: no longer required  Vision Screening: Recommended annual ophthalmology exams for early detection of glaucoma and other disorders of the eye. Is the patient up to date with their annual eye exam?  Yes  Who is the provider or what is the name of the office in which the pt attends annual eye exams? Dr. Michelene Heady  Dental Screening: Recommended annual dental exams for  proper oral hygiene  Community Resource Referral:  CRR required this visit?  No       Plan:    I have personally reviewed and addressed the Medicare Annual Wellness questionnaire and have noted the following in the patient's chart:  A. Medical and social history B. Use of alcohol, tobacco or illicit drugs  C. Current medications and supplements D. Functional ability and status E.  Nutritional status F.  Physical activity G. Advance directives H. List of other physicians I.  Hospitalizations, surgeries, and ER visits in previous 12 months J.  [place] such as hearing and vision if needed, cognitive and depression L. Referrals and appointments   In addition, I have reviewed and discussed with patient certain preventive protocols, quality metrics, and best practice recommendations. A written personalized care plan for preventive services as well as general preventive health recommendations were provided to patient.   Signed,  Jeremy Marker, LPN Nurse Health Advisor   Nurse Notes: pt doing well and appreciative of visit today

## 2019-03-03 ENCOUNTER — Other Ambulatory Visit: Payer: Self-pay

## 2019-03-03 ENCOUNTER — Ambulatory Visit (INDEPENDENT_AMBULATORY_CARE_PROVIDER_SITE_OTHER): Payer: Medicare Other | Admitting: Family Medicine

## 2019-03-03 ENCOUNTER — Telehealth: Payer: Self-pay

## 2019-03-03 ENCOUNTER — Encounter: Payer: Self-pay | Admitting: Family Medicine

## 2019-03-03 VITALS — BP 114/62 | HR 63 | Temp 97.1°F | Resp 15 | Ht 76.0 in | Wt 251.0 lb

## 2019-03-03 DIAGNOSIS — L97929 Non-pressure chronic ulcer of unspecified part of left lower leg with unspecified severity: Secondary | ICD-10-CM

## 2019-03-03 MED ORDER — DOXYCYCLINE HYCLATE 100 MG PO TABS: 100.0000 mg | ORAL_TABLET | Freq: Two times a day (BID) | ORAL | 0 refills | Status: DC

## 2019-03-03 NOTE — Telephone Encounter (Signed)
Pt called upset that he hasn't heard anything back and demanding to speak with the office. Per FC, tell pt that Dr. Ancil Boozer hasn't seen message yet and that if he feels this is that urgent he needs to go to Urgent Care. Pt informed.  Very upset and hung up.

## 2019-03-03 NOTE — Progress Notes (Signed)
Name: Jeremy [name]   MRN: [id number]    DOB: [birth year]   Date:03/03/2019       Progress Note  Subjective  Chief Complaint  Chief Complaint  Patient presents with  . Leg Swelling    Onset-yesterday, left lower leg-has edema 4 inches above his ankle, 3 cm across and red. Clean it off and put some antibiotic cream on the sore.     I connected with  Mattson Southern Ceballos on 03/03/19 at  1:00 PM EDT by telephone and verified that I am speaking with the correct person using two identifiers.  I discussed the limitations, risks, security and privacy concerns of performing an evaluation and management service by telephone and the availability of in person appointments. Staff also discussed with the patient that there may be a patient responsible charge related to this service. Patient Location: at home  Provider Location: 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 It is non tender, it is a clear drainage. He denies fever, chills. He has applied topical antibiotics but wants to get oral antibiotics to make sure it improves over the weekend. Advised to go to [facility] if the redness increases or any systemic symptoms. Return for follow up next week if needed    Patient Active Problem List   Diagnosis Date Noted  . Bicipital tenosynovitis 11/16/2018  . Impingement syndrome of shoulder region 11/16/2018  . Atherosclerosis of aorta ([place]) 07/21/2018  . History of diverticulitis of colon 11/15/2017  . Hyperlipidemia due to type 2 diabetes mellitus ([place]) 03/14/2017  . Bilateral lower extremity edema 03/27/2016  . Strain of hamstring muscle 02/21/2016  .  Vitamin D deficiency 12/05/2015  . Incomplete emptying of bladder 10/11/2015  . Hx of transient ischemic attack (TIA) 09/17/2015  . Transient vision disturbance of both eyes 08/28/2015  . Stroke-like symptoms 08/28/2015  . Diabetes mellitus type 2 without retinopathy ([place]) 05/31/2015  . Hyperlipidemia 05/30/2015  . Abnormal CT of the chest 05/15/2015  . Abnormal abdominal CT scan 05/15/2015  . Chronic kidney disease (CKD), stage I 01/08/2015  . Type II diabetes mellitus with renal manifestations, uncontrolled ([place]) 01/08/2015  . Type 2 diabetes, uncontrolled, with neuropathy ([place]) 01/08/2015  . Obesity 01/08/2015  . Proteinuria 01/08/2015  . Atrophy of vocal cord 02/07/2014  . Incisional hernia, without obstruction or gangrene 03/23/2013  . Enlarged prostate with lower urinary tract symptoms (LUTS) 11/03/2012  . Myopic degeneration, bilateral 08/12/2012  . Nonexudative age-related macular degeneration 07/15/2012  . Anisometropia 04/08/2012  . Pseudoaphakia 04/08/2012  . History of retinal detachment 04/08/2012  . Presence of intraocular lens 04/08/2012  . Bladder diverticulum 02/12/2012  . Diverticulum of bladder 02/12/2012  . Dysphonia 02/08/2012  . Secondary open-angle glaucoma of left eye, severe stage 01/13/2012  . Osteopenia 03/18/2010  . Gastro-esophageal reflux disease with esophagitis 02/07/2008  . Benign essential HTN 02/03/2007  . Hypertension associated with diabetes ([place]) 02/03/2007    Past Surgical History:  Procedure Laterality Date  . APPENDECTOMY    . BLADDER DIVERTICULECTOMY  2013  . COLON SURGERY Left 1980's  .  COLONOSCOPY W/ POLYPECTOMY  1990's   Dr Sharlet Salina  . COLONOSCOPY WITH PROPOFOL N/A 12/21/2014   Procedure: COLONOSCOPY WITH PROPOFOL;  Surgeon: Lollie Sails, MD;  Location: [facility] ENDOSCOPY;  Service: Endoscopy;  Laterality: N/A;  . COLONOSCOPY WITH PROPOFOL N/A 08/16/2017   Procedure: COLONOSCOPY WITH PROPOFOL;  Surgeon: Lollie Sails, MD;   Location: [facility] ENDOSCOPY;  Service: Endoscopy;  Laterality: N/A;  . ESOPHAGOGASTRODUODENOSCOPY (EGD) WITH PROPOFOL N/A 02/15/2015   Procedure: ESOPHAGOGASTRODUODENOSCOPY (EGD) WITH PROPOFOL;  Surgeon: Lollie Sails, MD;  Location: [facility] ENDOSCOPY;  Service: Endoscopy;  Laterality: N/A;  . ESOPHAGOGASTRODUODENOSCOPY (EGD) WITH PROPOFOL N/A 06/10/2017   Procedure: ESOPHAGOGASTRODUODENOSCOPY (EGD) WITH PROPOFOL;  Surgeon: Lollie Sails, MD;  Location: [facility] ENDOSCOPY;  Service: Endoscopy;  Laterality: N/A;  . ESOPHAGOGASTRODUODENOSCOPY (EGD) WITH PROPOFOL N/A 08/16/2017   Procedure: ESOPHAGOGASTRODUODENOSCOPY (EGD) WITH PROPOFOL;  Surgeon: Lollie Sails, MD;  Location: [facility] ENDOSCOPY;  Service: Endoscopy;  Laterality: N/A;  . EYE SURGERY    . GLAUCOMA SURGERY Left [place]     [place], Dr Sharlet Salina  . INGUINAL HERNIA REPAIR Bilateral 1960's  . LARYNX SURGERY  1990   3 times  . RETINAL DETACHMENT SURGERY Right   . TONSILLECTOMY    . turp  2013  . URETER SURGERY  2013   Dr Jacqlyn Larsen    Family History  Problem Relation Age of Onset  . Diabetes Mother   . Cataracts Mother   . Metabolic syndrome Mother   . Blindness Mother        r/t diabetes  . Cataracts Father   . Glaucoma Father        Retina-Detachment  . Atrial fibrillation Father   . Diverticulitis Sister   . Glaucoma Brother   . Diabetes Brother   . Cancer Sister        Gallbladder  . Cancer Daughter     Social History   Socioeconomic History  . Marital status: Married    Spouse name: Pamala Hurry   . Number of children: 3  . Years of education: Not on file  . Highest education level: Professional school degree (e.g., MD, DDS, DVM, JD)  Occupational History  . Occupation: Industrial/product designer     Comment: still working - Engineer, maintenance as a Engineer, maintenance  . Financial resource strain: Not hard at all  . Food insecurity    Worry: Never true     Inability: Never true  . Transportation needs    Medical: No    Non-medical: No  Tobacco Use  . Smoking status: Never Smoker  . Smokeless tobacco: Never Used  . Tobacco comment: smoking cessation materials not required  Substance and Sexual Activity  . Alcohol use: No    Frequency: Never  . Drug use: No  . Sexual activity: Yes  Lifestyle  . Physical activity    Days per week: 2 days    Minutes per session: 60 min  . Stress: Not at all  Relationships  . Social connections    Talks on phone: More than three times a week    Gets together: Once a week    Attends religious service: More than 4 times per year    Active member of club or organization: Yes    Attends meetings of clubs or organizations: Never    Relationship status: Married  . Intimate partner violence    Fear of current  or ex partner: No    Emotionally abused: No    Physically abused: No    Forced sexual activity: No  Other Topics Concern  . Not on file  Social History Narrative   Married , still works as a Engineer, drilling Environmental consultant)   Three children ( one died in his 13's with colon cancer) , one lives in [place] and the other one in [place]   His siblings are in [place] and [place]     Current Outpatient Medications:  .  bisoprolol (ZEBETA) 10 MG tablet, Take 1 tablet (10 mg total) by mouth daily., Disp: 90 tablet, Rfl: 1 .  brimonidine (ALPHAGAN P) 0.1 % SOLN, Place 1 drop into the left eye 2 (two) times daily. , Disp: , Rfl:  .  BYDUREON 2 MG PEN, INJECT 2 MG SUBCUTANEOUSLY ONCE A WEEK., Disp: 4 each, Rfl: 2 .  dorzolamide (TRUSOPT) 2 % ophthalmic solution, Place 1 drop into the left eye 2 (two) times daily., Disp: , Rfl: 3 .  esomeprazole (NEXIUM) 20 MG capsule, Take 20 mg by mouth daily., Disp: , Rfl:  .  finasteride (PROSCAR) 5 MG tablet, Take 1 tablet by mouth once daily, Disp: 90 tablet, Rfl: 0 .  INVOKANA 300 MG TABS tablet, TAKE ONE TABLET BY MOUTH ONCE DAILY, Disp: 90 tablet, Rfl: 0 .  loratadine (CLARITIN) 10 MG  tablet, Take 10 mg by mouth daily., Disp: , Rfl:  .  meloxicam (MOBIC) 15 MG tablet, Take 15 mg by mouth daily. Takes PRN, Disp: , Rfl:  .  metFORMIN (GLUCOPHAGE) 1000 MG tablet, Take 1 tablet (1,000 mg total) by mouth 2 (two) times daily., Disp: 180 tablet, Rfl: 1 .  naproxen sodium (ALEVE) 220 MG tablet, Take 1 tablet (220 mg total) by mouth daily as needed., Disp: 30 tablet, Rfl: 0 .  rosuvastatin (CRESTOR) 10 MG tablet, Take 1 tablet (10 mg total) by mouth at bedtime., Disp: 90 tablet, Rfl: 1 .  tadalafil (CIALIS) 20 MG tablet, Take 0.5-1 tablets (10-20 mg total) by mouth every other day as needed for erectile dysfunction., Disp: 5 tablet, Rfl: 5 .  telmisartan-hydrochlorothiazide (MICARDIS HCT) 80-25 MG tablet, Take 1 tablet by mouth daily., Disp: 90 tablet, Rfl: 1 .  timolol (TIMOPTIC) 0.5 % ophthalmic solution, INSTILL 1 DROP INTO LEFT EYE TWICE DAILY, Disp: , Rfl:  .  TRESIBA FLEXTOUCH 200 UNIT/ML SOPN, Inject 86 Units into the skin daily., Disp: , Rfl:   Allergies  Allergen Reactions  . Demerol [Meperidine] Other (See Comments) and Anaphylaxis    MAKES BLOOD PRESSURE BOTTOM OUT hypotension  . Dilaudid [Hydromorphone Hcl]   . Hydromorphone     Other reaction(s): Unknown MAKES BLOOD PRESSURE BOTTOM OUT  . Meperidine Hcl   . Actos [Pioglitazone] Other (See Comments) and Hives  . Dapagliflozin Rash  . Penicillins Rash    I personally reviewed active problem list, medication list, allergies, family history, social history with the patient/caregiver today.   ROS  Ten systems reviewed and is negative except as mentioned in HPI   Objective  Virtual encounter  Vitals:   03/03/19 1252  BP: 114/62  Pulse: 63  Resp: 15  Temp: (!) 97.1 F (36.2 C)  SpO2: 93%    Body mass index is 30.55 kg/m.  Physical Exam  Awake, alert and oriented  PHQ2/9: Depression screen [facility] 2/9 03/03/2019 02/28/2019 11/16/2018 05/17/2018 02/25/2018  Decreased Interest 0 0 0 0 0  Down, Depressed,  Hopeless 0 0 0 0 0  PHQ - 2 Score 0 0 0 0 0  Altered sleeping 0 - 0 0 0  Tired, decreased energy 0 - 0 0 0  Change in appetite 0 - 0 0 0  Feeling bad or failure about yourself  0 - 0 0 0  Trouble concentrating 0 - 0 0 0  Moving slowly or fidgety/restless 0 - 0 0 0  Suicidal thoughts 0 - 0 0 0  PHQ-9 Score 0 - 0 0 0  Difficult doing work/chores Not difficult at all - Not difficult at all Not difficult at all Not difficult at all   PHQ-2/9 Result is negative.    Fall Risk: Fall Risk  03/03/2019 02/28/2019 11/16/2018 07/11/2018 05/17/2018  Falls in the past year? 0 0 0 0 0  Number falls in past yr: 0 0 0 0 0  Injury with Fall? 0 0 0 0 0  Risk for fall due to : - - - - -  Risk for fall due to: Comment - - - - -     Assessment & Plan  1. Skin ulcer of left lower leg, unspecified ulcer stage (HCC)  - doxycycline (VIBRA-TABS) 100 MG tablet; Take 1 tablet (100 mg total) by mouth 2 (two) times daily.  Dispense: 14 tablet; Refill: 0  I discussed the assessment and treatment plan with the patient. The patient was provided an opportunity to ask questions and all were answered. The patient agreed with the plan and demonstrated an understanding of the instructions.   The patient was advised to call back or seek an in-person evaluation if the symptoms worsen or if the condition fails to improve as anticipated.  I provided 15  minutes of non-face-to-face time during this encounter.  Loistine Chance, MD

## 2019-03-03 NOTE — Telephone Encounter (Signed)
Copied from [place] [id number]. Topic: General - Other >> Mar 03, 2019  9:48 AM Yvette Rack wrote: Reason for CRM: Pt requests call back from the nurse to discuss having Rx for an antibiotic sent to his pharmacy.

## 2019-04-26 DIAGNOSIS — H401122 Primary open-angle glaucoma, left eye, moderate stage: Secondary | ICD-10-CM | POA: Diagnosis not present

## 2019-05-01 ENCOUNTER — Ambulatory Visit (INDEPENDENT_AMBULATORY_CARE_PROVIDER_SITE_OTHER): Payer: Medicare Other | Admitting: Family Medicine

## 2019-05-01 ENCOUNTER — Other Ambulatory Visit: Payer: Self-pay

## 2019-05-01 ENCOUNTER — Encounter: Payer: Self-pay | Admitting: Family Medicine

## 2019-05-01 VITALS — BP 126/72 | HR 69 | Temp 97.3°F | Resp 16 | Ht 76.0 in | Wt 258.1 lb

## 2019-05-01 DIAGNOSIS — E1129 Type 2 diabetes mellitus with other diabetic kidney complication: Secondary | ICD-10-CM | POA: Diagnosis not present

## 2019-05-01 DIAGNOSIS — I152 Hypertension secondary to endocrine disorders: Secondary | ICD-10-CM

## 2019-05-01 DIAGNOSIS — L97929 Non-pressure chronic ulcer of unspecified part of left lower leg with unspecified severity: Secondary | ICD-10-CM | POA: Diagnosis not present

## 2019-05-01 DIAGNOSIS — Z794 Long term (current) use of insulin: Secondary | ICD-10-CM

## 2019-05-01 DIAGNOSIS — N400 Enlarged prostate without lower urinary tract symptoms: Secondary | ICD-10-CM

## 2019-05-01 DIAGNOSIS — E1159 Type 2 diabetes mellitus with other circulatory complications: Secondary | ICD-10-CM

## 2019-05-01 DIAGNOSIS — E782 Mixed hyperlipidemia: Secondary | ICD-10-CM

## 2019-05-01 DIAGNOSIS — I10 Essential (primary) hypertension: Secondary | ICD-10-CM | POA: Diagnosis not present

## 2019-05-01 DIAGNOSIS — R809 Proteinuria, unspecified: Secondary | ICD-10-CM

## 2019-05-01 MED ORDER — BISOPROLOL FUMARATE 10 MG PO TABS: 10.0000 mg | ORAL_TABLET | Freq: Every day | ORAL | 1 refills | Status: DC

## 2019-05-01 MED ORDER — FINASTERIDE 5 MG PO TABS: 5.0000 mg | ORAL_TABLET | Freq: Every day | ORAL | 1 refills | Status: DC

## 2019-05-01 MED ORDER — TELMISARTAN-HCTZ 80-25 MG PO TABS: 1.0000 | ORAL_TABLET | Freq: Every day | ORAL | 1 refills | Status: DC

## 2019-05-01 MED ORDER — ROSUVASTATIN CALCIUM 10 MG PO TABS: 10.0000 mg | ORAL_TABLET | Freq: Every day | ORAL | 1 refills | Status: DC

## 2019-05-01 NOTE — Progress Notes (Signed)
Name: Jeremy [name]   MRN: [id number]    DOB: 07-Mar-1936   Date:05/01/2019       Progress Note  Subjective  Chief Complaint  Chief Complaint  Patient presents with  . Leg Lesion    Onset-2 months, lower left calf lesion. Took Doxcycline for 1 week from Dr. Ancil Boozer and scabbed over. Then broke down- visit daughter in [place] again was given Doxcycline 100 mg bid for 20 days and Bactrim double strength and still not healing. 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 He called after a few days and we gave him doxycycline and it started to heal. He went to visit his daughter in [place] and she gave him Septra and Doxy for 3 weeks and today he is here , states lesion is smaller, redness is gone but continues to have a non healing ulcer that oozes usually serous material. No fever, no chills.   DMII: he was diagnosed in the 33's during a hospital stay for small bowel obstruction. He was on oral medications for a long time, started on insulin a couple of years ago. Currently seeing Endocrinologist Dr. Honor Junes at [facility] . He denies polyphagia, polyuria or polydipsia. He is compliant, up to date with eye exam, foot exam and urine micro. No side effects of medications, He had a recent episode of hypoglycemia - woke up in a cold sweat and went down on Tresiba to 80 units, this am glucose is 70, he has an upcoming appointment with Dr. Manfred Shirts. A1C is up to date   HTN: he denies chest pain or palpitation. He is not on aspirin because of history of esophagitis and previous diverticulitis with bleeding. Good exercise tolerance and likes working in his yard BP is at goal   Atherosclerosis of aorta: normal distal pulses, found on CT scan done 07/2018  GERD with esophagitis, sees gastroenterologist and  is back on NexiumHe states symptoms are controlled, no regurgitation, heartburn or epigastric pain. He has hiatal hernia   Hyperlipidemia and aorta atherosclerosis: no myalgia, reviewed last labs and LDL was at goalat 42. HDL was slightly low. We will recheck labs today   Esophageal varices: he was seeing EGD and was sent to GI and EGD showed some variceal veins, but mostly on proximal esophagus, he is on low dose  PPIand had multiple studies but negative for cirrhosis so far. Still under the care of GI  Patient Active Problem List   Diagnosis Date Noted  . Bicipital tenosynovitis 11/16/2018  . Impingement syndrome of shoulder region 11/16/2018  . Atherosclerosis of aorta ([place]) 07/21/2018  . History of diverticulitis of colon 11/15/2017  . Hyperlipidemia due to type 2 diabetes mellitus ([place]) 03/14/2017  . Bilateral lower extremity edema 03/27/2016  . Strain of hamstring muscle 02/21/2016  . Vitamin D deficiency 12/05/2015  . Incomplete emptying of bladder 10/11/2015  . Hx of transient ischemic attack (TIA) 09/17/2015  . Transient vision disturbance of both eyes 08/28/2015  . Stroke-like symptoms 08/28/2015  . Diabetes mellitus type 2 without retinopathy ([place]) 05/31/2015  . Hyperlipidemia 05/30/2015  . Abnormal CT of the chest 05/15/2015  . Abnormal abdominal CT scan 05/15/2015  . Chronic kidney disease (CKD), stage I 01/08/2015  . Type II diabetes mellitus with renal manifestations, uncontrolled ([place])  01/08/2015  . Type 2 diabetes, uncontrolled, with neuropathy ([place]) 01/08/2015  . Obesity 01/08/2015  . Proteinuria 01/08/2015  . Atrophy of vocal cord 02/07/2014  . Incisional hernia, without obstruction or gangrene 03/23/2013  . Enlarged prostate with lower urinary tract symptoms (LUTS) 11/03/2012  . Myopic degeneration, bilateral 08/12/2012  . Nonexudative age-related macular degeneration 07/15/2012  . Anisometropia 04/08/2012  . Pseudoaphakia 04/08/2012  . History of  retinal detachment 04/08/2012  . Presence of intraocular lens 04/08/2012  . Bladder diverticulum 02/12/2012  . Diverticulum of bladder 02/12/2012  . Dysphonia 02/08/2012  . Secondary open-angle glaucoma of left eye, severe stage 01/13/2012  . Osteopenia 03/18/2010  . Gastro-esophageal reflux disease with esophagitis 02/07/2008  . Benign essential HTN 02/03/2007  . Hypertension associated with diabetes ([place]) 02/03/2007    Past Surgical History:  Procedure Laterality Date  . APPENDECTOMY    . BLADDER DIVERTICULECTOMY  2013  . COLON SURGERY Left 1980's  . COLONOSCOPY W/ POLYPECTOMY  1990's   Dr Sharlet Salina  . COLONOSCOPY WITH PROPOFOL N/A 12/21/2014   Procedure: COLONOSCOPY WITH PROPOFOL;  Surgeon: Lollie Sails, MD;  Location: [facility] ENDOSCOPY;  Service: Endoscopy;  Laterality: N/A;  . COLONOSCOPY WITH PROPOFOL N/A 08/16/2017   Procedure: COLONOSCOPY WITH PROPOFOL;  Surgeon: Lollie Sails, MD;  Location: [facility] ENDOSCOPY;  Service: Endoscopy;  Laterality: N/A;  . ESOPHAGOGASTRODUODENOSCOPY (EGD) WITH PROPOFOL N/A 02/15/2015   Procedure: ESOPHAGOGASTRODUODENOSCOPY (EGD) WITH PROPOFOL;  Surgeon: Lollie Sails, MD;  Location: [facility] ENDOSCOPY;  Service: Endoscopy;  Laterality: N/A;  . ESOPHAGOGASTRODUODENOSCOPY (EGD) WITH PROPOFOL N/A 06/10/2017   Procedure: ESOPHAGOGASTRODUODENOSCOPY (EGD) WITH PROPOFOL;  Surgeon: Lollie Sails, MD;  Location: [facility] ENDOSCOPY;  Service: Endoscopy;  Laterality: N/A;  . ESOPHAGOGASTRODUODENOSCOPY (EGD) WITH PROPOFOL N/A 08/16/2017   Procedure: ESOPHAGOGASTRODUODENOSCOPY (EGD) WITH PROPOFOL;  Surgeon: Lollie Sails, MD;  Location: [facility] ENDOSCOPY;  Service: Endoscopy;  Laterality: N/A;  . EYE SURGERY    . GLAUCOMA SURGERY Left [place]     [facility], Dr Sharlet Salina  . INGUINAL HERNIA REPAIR Bilateral 1960's  . LARYNX SURGERY  1990   3 times  . RETINAL DETACHMENT SURGERY Right   .  TONSILLECTOMY    . turp  2013  . URETER SURGERY  2013   Dr Jacqlyn Larsen    Family History  Problem Relation Age of Onset  . Diabetes Mother   . Cataracts Mother   . Metabolic syndrome Mother   . Blindness Mother        r/t diabetes  . Cataracts Father   . Glaucoma Father        Retina-Detachment  . Atrial fibrillation Father   . Diverticulitis Sister   . Glaucoma Brother   . Diabetes Brother   . Cancer Sister        Gallbladder  . Cancer Daughter     Social History   Socioeconomic History  . Marital status: Married    Spouse name: Pamala Hurry   . Number of children: 3  . Years of education: Not on file  . Highest education level: Professional school degree (e.g., MD, DDS, DVM, JD)  Occupational History  . Occupation: Industrial/product designer     Comment: still working - Engineer, maintenance as a Engineer, maintenance  . Financial resource strain: Not hard at all  . Food insecurity    Worry: Never true    Inability: Never true  . Transportation needs  Medical: No    Non-medical: No  Tobacco Use  . Smoking status: Never Smoker  . Smokeless tobacco: Never Used  . Tobacco comment: smoking cessation materials not required  Substance and Sexual Activity  . Alcohol use: No    Frequency: Never  . Drug use: No  . Sexual activity: Yes  Lifestyle  . Physical activity    Days per week: 2 days    Minutes per session: 60 min  . Stress: Not at all  Relationships  . Social connections    Talks on phone: More than three times a week    Gets together: Once a week    Attends religious service: More than 4 times per year    Active member of club or organization: Yes    Attends meetings of clubs or organizations: Never    Relationship status: Married  . Intimate partner violence    Fear of current or ex partner: No    Emotionally abused: No    Physically abused: No    Forced sexual activity: No  Other Topics Concern  . Not on file  Social History Narrative   Married , still works as a  Engineer, drilling Environmental consultant)   Three children ( one died in his 71's with colon cancer) , one lives in [place] and the other one in [place]   His siblings are in [place] and [place]     Current Outpatient Medications:  .  bisoprolol (ZEBETA) 10 MG tablet, Take 1 tablet (10 mg total) by mouth daily., Disp: 90 tablet, Rfl: 1 .  brimonidine (ALPHAGAN P) 0.1 % SOLN, Place 1 drop into the left eye 2 (two) times daily. , Disp: , Rfl:  .  BYDUREON 2 MG PEN, INJECT 2 MG SUBCUTANEOUSLY ONCE A WEEK., Disp: 4 each, Rfl: 2 .  dorzolamide (TRUSOPT) 2 % ophthalmic solution, Place 1 drop into the left eye 2 (two) times daily., Disp: , Rfl: 3 .  esomeprazole (NEXIUM) 20 MG capsule, Take 20 mg by mouth daily., Disp: , Rfl:  .  finasteride (PROSCAR) 5 MG tablet, Take 1 tablet by mouth once daily, Disp: 90 tablet, Rfl: 0 .  INVOKANA 300 MG TABS tablet, TAKE ONE TABLET BY MOUTH ONCE DAILY, Disp: 90 tablet, Rfl: 0 .  loratadine (CLARITIN) 10 MG tablet, Take 10 mg by mouth daily., Disp: , Rfl:  .  meloxicam (MOBIC) 15 MG tablet, Take 15 mg by mouth daily. Takes PRN, Disp: , Rfl:  .  metFORMIN (GLUCOPHAGE) 1000 MG tablet, Take 1 tablet (1,000 mg total) by mouth 2 (two) times daily., Disp: 180 tablet, Rfl: 1 .  naproxen sodium (ALEVE) 220 MG tablet, Take 1 tablet (220 mg total) by mouth daily as needed., Disp: 30 tablet, Rfl: 0 .  rosuvastatin (CRESTOR) 10 MG tablet, Take 1 tablet (10 mg total) by mouth at bedtime., Disp: 90 tablet, Rfl: 1 .  tadalafil (CIALIS) 20 MG tablet, Take 0.5-1 tablets (10-20 mg total) by mouth every other day as needed for erectile dysfunction., Disp: 5 tablet, Rfl: 5 .  telmisartan-hydrochlorothiazide (MICARDIS HCT) 80-25 MG tablet, Take 1 tablet by mouth daily., Disp: 90 tablet, Rfl: 1 .  timolol (TIMOPTIC) 0.5 % ophthalmic solution, INSTILL 1 DROP INTO LEFT EYE TWICE DAILY, Disp: , Rfl:  .  TRESIBA FLEXTOUCH 200 UNIT/ML SOPN, Inject 84 Units into the skin daily. Took 80 units this morning because his sugars  have been low and Dr. Honor Junes wanted his sugars in the 80s, Disp: , Rfl:  .  doxycycline (VIBRA-TABS) 100 MG tablet, Take 1 tablet (100 mg total) by mouth 2 (two) times daily. (Patient not taking: Reported on 05/01/2019), Disp: 14 tablet, Rfl: 0  Allergies  Allergen Reactions  . Demerol [Meperidine] Other (See Comments) and Anaphylaxis    MAKES BLOOD PRESSURE BOTTOM OUT hypotension  . Dilaudid [Hydromorphone Hcl]   . Hydromorphone     Other reaction(s): Unknown MAKES BLOOD PRESSURE BOTTOM OUT  . Meperidine Hcl   . Actos [Pioglitazone] Other (See Comments) and Hives  . Dapagliflozin Rash  . Penicillins Rash    I personally reviewed active problem list, medication list, allergies, family history, social history, health maintenance with the patient/caregiver today.   ROS  Constitutional: Negative for fever or weight change.  Respiratory: Negative for cough and shortness of breath.   Cardiovascular: Negative for chest pain or palpitations.  Gastrointestinal: Negative for abdominal pain, no bowel changes.  Musculoskeletal: Negative for gait problem or joint swelling.  Skin: Negative for rash. leg ulceration  Neurological: Negative for dizziness or headache.  No other specific complaints in a complete review of systems (except as listed in HPI above).  Objective  Vitals:   05/01/19 1438  BP: 126/72  Pulse: 69  Resp: 16  Temp: (!) 97.3 F (36.3 C)  TempSrc: Temporal  SpO2: 96%  Weight: 258 lb 1.6 oz (117.1 kg)  Height: 6\' 4"  (1.93 m)    Body mass index is 31.42 kg/m.  Physical Exam  Constitutional: Patient appears well-developed and well-nourished. Obese  No distress.  HEENT: head atraumatic, normocephalic, pupils equal and reactive to ligh Cardiovascular: Normal rate, regular rhythm and normal heart sounds.  No murmur heard. No BLE edema. Normal distal pulses Skin: 66mm X12 mm ulceration with mild oozing on left posterior lower leg  Pulmonary/Chest: Effort normal  and breath sounds normal. No respiratory distress. Abdominal: Soft.  There is no tenderness. Psychiatric: Patient has a normal mood and affect. behavior is normal. Judgment and thought content normal.  PHQ2/9: Depression screen [facility] 2/9 03/03/2019 02/28/2019 11/16/2018 05/17/2018 02/25/2018  Decreased Interest 0 0 0 0 0  Down, Depressed, Hopeless 0 0 0 0 0  PHQ - 2 Score 0 0 0 0 0  Altered sleeping 0 - 0 0 0  Tired, decreased energy 0 - 0 0 0  Change in appetite 0 - 0 0 0  Feeling bad or failure about yourself  0 - 0 0 0  Trouble concentrating 0 - 0 0 0  Moving slowly or fidgety/restless 0 - 0 0 0  Suicidal thoughts 0 - 0 0 0  PHQ-9 Score 0 - 0 0 0  Difficult doing work/chores Not difficult at all - Not difficult at all Not difficult at all Not difficult at all    phq 9 is negative   Fall Risk: Fall Risk  05/01/2019 03/03/2019 02/28/2019 11/16/2018 07/11/2018  Falls in the past year? - 0 0 0 0  Number falls in past yr: 0 0 0 0 0  Injury with Fall? 0 0 0 0 0  Risk for fall due to : - - - - -  Risk for fall due to: Comment - - - - -     Functional Status Survey: Is the patient deaf or have difficulty hearing?: No Does the patient have difficulty seeing, even when wearing glasses/contacts?: Yes Does the patient have difficulty concentrating, remembering, or making decisions?: No Does the patient have difficulty walking or climbing stairs?: No Does the patient have difficulty dressing or  bathing?: No Does the patient have difficulty doing errands alone such as visiting a doctor's office or shopping?: No    Assessment & Plan   1. Skin ulcer of left lower leg, unspecified ulcer stage ([place])  - Ambulatory referral to General Surgery  2. Benign essential HTN  - CBC with Differential/Platelet - COMPLETE METABOLIC PANEL WITH GFR - telmisartan-hydrochlorothiazide (MICARDIS HCT) 80-25 MG tablet; Take 1 tablet by mouth daily.  Dispense: 90 tablet; Refill: 1 - bisoprolol (ZEBETA) 10 MG  tablet; Take 1 tablet (10 mg total) by mouth daily.  Dispense: 90 tablet; Refill: 1  3. Hypertension associated with diabetes ([place])  - CBC with Differential/Platelet - COMPLETE METABOLIC PANEL WITH GFR - telmisartan-hydrochlorothiazide (MICARDIS HCT) 80-25 MG tablet; Take 1 tablet by mouth daily.  Dispense: 90 tablet; Refill: 1  4. Mixed hyperlipidemia  - Lipid panel - rosuvastatin (CRESTOR) 10 MG tablet; Take 1 tablet (10 mg total) by mouth at bedtime.  Dispense: 90 tablet; Refill: 1  5. Type 2 diabetes mellitus with microalbuminuria, with long-term current use of insulin ([facility])  - telmisartan-hydrochlorothiazide (MICARDIS HCT) 80-25 MG tablet; Take 1 tablet by mouth daily.  Dispense: 90 tablet; Refill: 1  6. Benign prostatic hyperplasia without lower urinary tract symptoms  - finasteride (PROSCAR) 5 MG tablet; Take 1 tablet (5 mg total) by mouth daily.  Dispense: 90 tablet; Refill: 1

## 2019-05-02 LAB — COMPLETE METABOLIC PANEL WITH GFR
AG Ratio: 2.2 (calc) (ref 1.0–2.5)
ALT: 16 U/L (ref 9–46)
AST: 16 U/L (ref 10–35)
Albumin: 4.3 g/dL (ref 3.6–5.1)
Alkaline phosphatase (APISO): 32 U/L — ABNORMAL LOW (ref 35–144)
BUN: 22 mg/dL (ref 7–25)
CO2: 31 mmol/L (ref 20–32)
Calcium: 9.4 mg/dL (ref 8.6–10.3)
Chloride: 105 mmol/L (ref 98–110)
Creat: 0.79 mg/dL (ref 0.70–1.11)
GFR, Est African American: 98 mL/min/{1.73_m2} (ref >=60)
GFR, Est Non African American: 84 mL/min/{1.73_m2} (ref >=60)
Globulin: 2 g/dL (calc) (ref 1.9–3.7)
Glucose, Bld: 82 mg/dL (ref 65–99)
Potassium: 4.4 mmol/L (ref 3.5–5.3)
Sodium: 143 mmol/L (ref 135–146)
Total Bilirubin: 0.6 mg/dL (ref 0.2–1.2)
Total Protein: 6.3 g/dL (ref 6.1–8.1)

## 2019-05-02 LAB — CBC WITH DIFFERENTIAL/PLATELET
Absolute Monocytes: 583 cells/uL (ref 200–950)
Basophils Absolute: 43 cells/uL (ref 0–200)
Basophils Relative: 0.6 %
Eosinophils Absolute: 302 cells/uL (ref 15–500)
Eosinophils Relative: 4.2 %
HCT: 44 % (ref 38.5–50.0)
Hemoglobin: 15 g/dL (ref 13.2–17.1)
Lymphs Abs: 1426 cells/uL (ref 850–3900)
MCH: 33.6 pg — ABNORMAL HIGH (ref 27.0–33.0)
MCHC: 34.1 g/dL (ref 32.0–36.0)
MCV: 98.4 fL (ref 80.0–100.0)
MPV: 10.6 fL (ref 7.5–12.5)
Monocytes Relative: 8.1 %
Neutro Abs: 4846 cells/uL (ref 1500–7800)
Neutrophils Relative %: 67.3 %
Platelets: 170 10*3/uL (ref 140–400)
RBC: 4.47 10*6/uL (ref 4.20–5.80)
RDW: 12.8 % (ref 11.0–15.0)
Total Lymphocyte: 19.8 %
WBC: 7.2 10*3/uL (ref 3.8–10.8)

## 2019-05-02 LAB — LIPID PANEL
Cholesterol: 124 mg/dL (ref <200)
HDL: 35 mg/dL — ABNORMAL LOW (ref >=40)
Non-HDL Cholesterol (Calc): 89 mg/dL (calc) (ref <130)
Total CHOL/HDL Ratio: 3.5 (calc) (ref <5.0)
Triglycerides: 458 mg/dL — ABNORMAL HIGH (ref <150)

## 2019-05-05 ENCOUNTER — Other Ambulatory Visit: Payer: Self-pay | Admitting: Family Medicine

## 2019-05-05 MED ORDER — OMEGA-3-ACID ETHYL ESTERS 1 G PO CAPS: 2.0000 g | ORAL_CAPSULE | Freq: Two times a day (BID) | ORAL | 1 refills | Status: DC

## 2019-05-09 DIAGNOSIS — S81802A Unspecified open wound, left lower leg, initial encounter: Secondary | ICD-10-CM | POA: Diagnosis not present

## 2019-05-16 DIAGNOSIS — S81802D Unspecified open wound, left lower leg, subsequent encounter: Secondary | ICD-10-CM | POA: Diagnosis not present

## 2019-05-19 ENCOUNTER — Ambulatory Visit: Payer: Medicare Other | Admitting: Family Medicine

## 2019-05-23 DIAGNOSIS — S81802S Unspecified open wound, left lower leg, sequela: Secondary | ICD-10-CM | POA: Diagnosis not present

## 2019-05-31 LAB — HEMOGLOBIN A1C: Hemoglobin A1C: 6.1

## 2019-06-29 DIAGNOSIS — E1159 Type 2 diabetes mellitus with other circulatory complications: Secondary | ICD-10-CM | POA: Diagnosis not present

## 2019-06-29 DIAGNOSIS — I10 Essential (primary) hypertension: Secondary | ICD-10-CM | POA: Diagnosis not present

## 2019-06-29 DIAGNOSIS — Z794 Long term (current) use of insulin: Secondary | ICD-10-CM | POA: Diagnosis not present

## 2019-06-29 DIAGNOSIS — E1142 Type 2 diabetes mellitus with diabetic polyneuropathy: Secondary | ICD-10-CM | POA: Diagnosis not present

## 2019-06-29 DIAGNOSIS — R0602 Shortness of breath: Secondary | ICD-10-CM | POA: Diagnosis not present

## 2019-06-29 DIAGNOSIS — R079 Chest pain, unspecified: Secondary | ICD-10-CM | POA: Diagnosis not present

## 2019-07-06 DIAGNOSIS — L97929 Non-pressure chronic ulcer of unspecified part of left lower leg with unspecified severity: Secondary | ICD-10-CM | POA: Diagnosis not present

## 2019-07-11 DIAGNOSIS — L97929 Non-pressure chronic ulcer of unspecified part of left lower leg with unspecified severity: Secondary | ICD-10-CM | POA: Diagnosis not present

## 2019-07-20 DIAGNOSIS — L97929 Non-pressure chronic ulcer of unspecified part of left lower leg with unspecified severity: Secondary | ICD-10-CM | POA: Diagnosis not present

## 2019-07-25 ENCOUNTER — Other Ambulatory Visit: Payer: Self-pay | Admitting: General Surgery

## 2019-07-25 DIAGNOSIS — L97929 Non-pressure chronic ulcer of unspecified part of left lower leg with unspecified severity: Secondary | ICD-10-CM | POA: Diagnosis not present

## 2019-07-25 DIAGNOSIS — L97229 Non-pressure chronic ulcer of left calf with unspecified severity: Secondary | ICD-10-CM

## 2019-07-25 DIAGNOSIS — I83022 Varicose veins of left lower extremity with ulcer of calf: Secondary | ICD-10-CM

## 2019-07-25 NOTE — Progress Notes (Signed)
va

## 2019-08-28 DIAGNOSIS — E1142 Type 2 diabetes mellitus with diabetic polyneuropathy: Secondary | ICD-10-CM | POA: Diagnosis not present

## 2019-08-28 DIAGNOSIS — B351 Tinea unguium: Secondary | ICD-10-CM | POA: Diagnosis not present

## 2019-08-28 DIAGNOSIS — L851 Acquired keratosis [keratoderma] palmaris et plantaris: Secondary | ICD-10-CM | POA: Diagnosis not present

## 2019-10-23 DIAGNOSIS — E119 Type 2 diabetes mellitus without complications: Secondary | ICD-10-CM | POA: Diagnosis not present

## 2019-10-26 ENCOUNTER — Encounter: Payer: Self-pay | Admitting: Family Medicine

## 2019-10-30 ENCOUNTER — Ambulatory Visit (INDEPENDENT_AMBULATORY_CARE_PROVIDER_SITE_OTHER): Payer: Medicare Other | Admitting: Family Medicine

## 2019-10-30 ENCOUNTER — Other Ambulatory Visit: Payer: Self-pay

## 2019-10-30 ENCOUNTER — Encounter: Payer: Self-pay | Admitting: Family Medicine

## 2019-10-30 VITALS — BP 124/74 | HR 70 | Temp 97.8°F | Resp 16 | Ht 75.5 in | Wt 249.3 lb

## 2019-10-30 DIAGNOSIS — I700 Atherosclerosis of aorta: Secondary | ICD-10-CM

## 2019-10-30 DIAGNOSIS — E1169 Type 2 diabetes mellitus with other specified complication: Secondary | ICD-10-CM | POA: Diagnosis not present

## 2019-10-30 DIAGNOSIS — R809 Proteinuria, unspecified: Secondary | ICD-10-CM | POA: Diagnosis not present

## 2019-10-30 DIAGNOSIS — E1129 Type 2 diabetes mellitus with other diabetic kidney complication: Secondary | ICD-10-CM

## 2019-10-30 DIAGNOSIS — E785 Hyperlipidemia, unspecified: Secondary | ICD-10-CM

## 2019-10-30 DIAGNOSIS — E782 Mixed hyperlipidemia: Secondary | ICD-10-CM | POA: Diagnosis not present

## 2019-10-30 DIAGNOSIS — Z794 Long term (current) use of insulin: Secondary | ICD-10-CM

## 2019-10-30 DIAGNOSIS — E1159 Type 2 diabetes mellitus with other circulatory complications: Secondary | ICD-10-CM | POA: Diagnosis not present

## 2019-10-30 DIAGNOSIS — N400 Enlarged prostate without lower urinary tract symptoms: Secondary | ICD-10-CM

## 2019-10-30 DIAGNOSIS — R600 Localized edema: Secondary | ICD-10-CM

## 2019-10-30 DIAGNOSIS — I10 Essential (primary) hypertension: Secondary | ICD-10-CM | POA: Diagnosis not present

## 2019-10-30 DIAGNOSIS — E1140 Type 2 diabetes mellitus with diabetic neuropathy, unspecified: Secondary | ICD-10-CM | POA: Diagnosis not present

## 2019-10-30 DIAGNOSIS — R109 Unspecified abdominal pain: Secondary | ICD-10-CM | POA: Diagnosis not present

## 2019-10-30 DIAGNOSIS — E1165 Type 2 diabetes mellitus with hyperglycemia: Secondary | ICD-10-CM

## 2019-10-30 DIAGNOSIS — L989 Disorder of the skin and subcutaneous tissue, unspecified: Secondary | ICD-10-CM

## 2019-10-30 DIAGNOSIS — IMO0002 Reserved for concepts with insufficient information to code with codable children: Secondary | ICD-10-CM

## 2019-10-30 MED ORDER — OMEGA-3-ACID ETHYL ESTERS 1 G PO CAPS: 2.0000 g | ORAL_CAPSULE | Freq: Two times a day (BID) | ORAL | 1 refills | Status: DC

## 2019-10-30 MED ORDER — ROSUVASTATIN CALCIUM 10 MG PO TABS: 10.0000 mg | ORAL_TABLET | Freq: Every day | ORAL | 1 refills | Status: DC

## 2019-10-30 MED ORDER — TELMISARTAN-HCTZ 80-25 MG PO TABS: 1.0000 | ORAL_TABLET | Freq: Every day | ORAL | 1 refills | Status: DC

## 2019-10-30 MED ORDER — FINASTERIDE 5 MG PO TABS: 5.0000 mg | ORAL_TABLET | Freq: Every day | ORAL | 1 refills | Status: DC

## 2019-10-30 NOTE — Progress Notes (Signed)
Name: Jeremy [name]   MRN: [id number]    DOB: [birth year]   Date:10/30/2019       Progress Note  Subjective  Chief Complaint  Chief Complaint  Patient presents with  . Follow-up  . Diabetes  . Hypertension  . Hyperlipidemia    HPI   DMII: he was diagnosed in the 90's during a hospital stay for small bowel obstruction. He was on oral medications for a long time, started on insulin a couple of years ago. Currently seeing EndocrinologistDr. Honor Junes at [facility]. He denies polyphagia, polyuria or polydipsia. He is compliant, up to date with eye exam, foot exam and urine micro. No side effects of medications, he is on insulin and since last visit with  Dr. Manfred Shirts Dec 2020, Tyler Aas dose decreased from 80 to 76 , and no recent hypoglycemic episodes. Last A1C was 6.1%  He had a diabetic ulcer, seen by Dr. Fleet Contras and is wearing a compression stocking hose, he wants to hold off on seeing vascular surgeon now  HTN: he denies chest pain, palpitation or dizziness. He has good exercise tolerance. He is able to do gardening , no sob with activity   Atherosclerosis of aorta: normal distal pulses, found on CT scan done 07/2018, no side effects of Rosuvastatin   GERD with esophagitis, sees gastroenterologist and is back on NexiumHe denies regurgitation, heartburn or epigastric pain. He has hiatal hernia but symptoms  are controlled with medication   Hyperlipidemia and aorta atherosclerosis: no myalgia, reviewed last labs triglycerides was very high, he will return fasting for recheck labs   Esophageal varices: he was seeing EGD and was sent to GI and EGD showed some variceal veins, but mostly on proximal esophagus, he is on low dose PPIand had multiple studies but negative for cirrhosis so far. He is currently switching from [facility] to Dr. Fleet Contras   Skin lesion: he has noticed some bumps on the left forehead , that scabs over but never resolves , over the past couple of  years  Patient Active Problem List   Diagnosis Date Noted  . Bicipital tenosynovitis 11/16/2018  . Impingement syndrome of shoulder region 11/16/2018  . Atherosclerosis of aorta ([place]) 07/21/2018  . History of diverticulitis of colon 11/15/2017  . Hyperlipidemia due to type 2 diabetes mellitus ([place]) 03/14/2017  . Bilateral lower extremity edema 03/27/2016  . Strain of hamstring muscle 02/21/2016  . Vitamin D deficiency 12/05/2015  . Incomplete emptying of bladder 10/11/2015  . Hx of transient ischemic attack (TIA) 09/17/2015  . Transient vision disturbance of both eyes 08/28/2015  . Stroke-like symptoms 08/28/2015  . Diabetes mellitus type 2 without retinopathy ([place]) 05/31/2015  . Hyperlipidemia 05/30/2015  . Abnormal CT of the chest 05/15/2015  . Abnormal abdominal CT scan 05/15/2015  . Chronic kidney disease (CKD), stage I 01/08/2015  . Type II diabetes mellitus with renal manifestations, uncontrolled ([place]) 01/08/2015  . Type 2 diabetes, uncontrolled, with neuropathy ([place]) 01/08/2015  . Obesity 01/08/2015  . Proteinuria 01/08/2015  . Atrophy of vocal cord 02/07/2014  . Incisional hernia, without obstruction or gangrene 03/23/2013  . Enlarged prostate with lower urinary tract symptoms (LUTS) 11/03/2012  . Myopic degeneration, bilateral 08/12/2012  . Nonexudative age-related macular degeneration 07/15/2012  . Anisometropia 04/08/2012  . Pseudoaphakia 04/08/2012  . History of retinal detachment 04/08/2012  . Presence of intraocular lens 04/08/2012  . Bladder diverticulum 02/12/2012  . Diverticulum of bladder 02/12/2012  . Dysphonia 02/08/2012  . Secondary open-angle glaucoma of left eye,  severe stage 01/13/2012  . Osteopenia 03/18/2010  . Gastro-esophageal reflux disease with esophagitis 02/07/2008  . Benign essential HTN 02/03/2007  . Hypertension associated with diabetes ([place]) 02/03/2007    Past Surgical History:  Procedure Laterality Date  . APPENDECTOMY    . BLADDER  DIVERTICULECTOMY  2013  . COLON SURGERY Left 1980's  . COLONOSCOPY W/ POLYPECTOMY  1990's   Dr Sharlet Salina  . COLONOSCOPY WITH PROPOFOL N/A 12/21/2014   Procedure: COLONOSCOPY WITH PROPOFOL;  Surgeon: Lollie Sails, MD;  Location: [facility] ENDOSCOPY;  Service: Endoscopy;  Laterality: N/A;  . COLONOSCOPY WITH PROPOFOL N/A 08/16/2017   Procedure: COLONOSCOPY WITH PROPOFOL;  Surgeon: Lollie Sails, MD;  Location: [facility] ENDOSCOPY;  Service: Endoscopy;  Laterality: N/A;  . ESOPHAGOGASTRODUODENOSCOPY (EGD) WITH PROPOFOL N/A 02/15/2015   Procedure: ESOPHAGOGASTRODUODENOSCOPY (EGD) WITH PROPOFOL;  Surgeon: Lollie Sails, MD;  Location: [facility] ENDOSCOPY;  Service: Endoscopy;  Laterality: N/A;  . ESOPHAGOGASTRODUODENOSCOPY (EGD) WITH PROPOFOL N/A 06/10/2017   Procedure: ESOPHAGOGASTRODUODENOSCOPY (EGD) WITH PROPOFOL;  Surgeon: Lollie Sails, MD;  Location[facility] ENDOSCOPY;  Service: Endoscopy;  Laterality: N/A;  . ESOPHAGOGASTRODUODENOSCOPY (EGD) WITH PROPOFOL N/A 08/16/2017   Procedure: ESOPHAGOGASTRODUODENOSCOPY (EGD) WITH PROPOFOL;  Surgeon: Lollie Sails, MD;  Location: [facility] ENDOSCOPY;  Service: Endoscopy;  Laterality: N/A;  . EYE SURGERY    . GLAUCOMA SURGERY Left [place]     [place], Dr Sharlet Salina  . INGUINAL HERNIA REPAIR Bilateral 1960's  . LARYNX SURGERY  1990   3 times  . RETINAL DETACHMENT SURGERY Right   . TONSILLECTOMY    . turp  2013  . URETER SURGERY  2013   Dr Jacqlyn Larsen    Family History  Problem Relation Age of Onset  . Diabetes Mother   . Cataracts Mother   . Metabolic syndrome Mother   . Blindness Mother        r/t diabetes  . Cataracts Father   . Glaucoma Father        Retina-Detachment  . Atrial fibrillation Father   . Diverticulitis Sister   . Glaucoma Brother   . Diabetes Brother   . Cancer Sister        Gallbladder  . Cancer Daughter     Social History   Tobacco Use  . Smoking  status: Never Smoker  . Smokeless tobacco: Never Used  . Tobacco comment: smoking cessation materials not required  Substance Use Topics  . Alcohol use: No     Current Outpatient Medications:  .  bisoprolol (ZEBETA) 10 MG tablet, Take 1 tablet (10 mg total) by mouth daily., Disp: 90 tablet, Rfl: 1 .  brimonidine (ALPHAGAN P) 0.1 % SOLN, Place 1 drop into the left eye 2 (two) times daily. , Disp: , Rfl:  .  BYDUREON 2 MG PEN, INJECT 2 MG SUBCUTANEOUSLY ONCE A WEEK., Disp: 4 each, Rfl: 2 .  dorzolamide (TRUSOPT) 2 % ophthalmic solution, Place 1 drop into the left eye 2 (two) times daily., Disp: , Rfl: 3 .  esomeprazole (NEXIUM) 20 MG capsule, Take 20 mg by mouth daily., Disp: , Rfl:  .  finasteride (PROSCAR) 5 MG tablet, Take 1 tablet (5 mg total) by mouth daily., Disp: 90 tablet, Rfl: 1 .  INVOKANA 300 MG TABS tablet, TAKE ONE TABLET BY MOUTH ONCE DAILY, Disp: 90 tablet, Rfl: 0 .  metFORMIN (GLUCOPHAGE) 1000 MG tablet, Take 1 tablet (1,000 mg total) by mouth  2 (two) times daily., Disp: 180 tablet, Rfl: 1 .  naproxen sodium (ALEVE) 220 MG tablet, Take 1 tablet (220 mg total) by mouth daily as needed., Disp: 30 tablet, Rfl: 0 .  omega-3 acid ethyl esters (LOVAZA) 1 g capsule, Take 2 capsules (2 g total) by mouth 2 (two) times daily., Disp: 360 capsule, Rfl: 1 .  rosuvastatin (CRESTOR) 10 MG tablet, Take 1 tablet (10 mg total) by mouth at bedtime., Disp: 90 tablet, Rfl: 1 .  tadalafil (CIALIS) 20 MG tablet, Take 0.5-1 tablets (10-20 mg total) by mouth every other day as needed for erectile dysfunction., Disp: 5 tablet, Rfl: 5 .  telmisartan-hydrochlorothiazide (MICARDIS HCT) 80-25 MG tablet, Take 1 tablet by mouth daily., Disp: 90 tablet, Rfl: 1 .  timolol (TIMOPTIC) 0.5 % ophthalmic solution, INSTILL 1 DROP INTO LEFT EYE TWICE DAILY, Disp: , Rfl:  .  TRESIBA FLEXTOUCH 200 UNIT/ML SOPN, Inject 84 Units into the skin daily. Took 80 units this morning because his sugars have been low and Dr.  Honor Junes wanted his sugars in the 80s, Disp: , Rfl:   Allergies  Allergen Reactions  . Demerol [Meperidine] Other (See Comments) and Anaphylaxis    MAKES BLOOD PRESSURE BOTTOM OUT hypotension  . Dilaudid [Hydromorphone Hcl]   . Hydromorphone     Other reaction(s): Unknown MAKES BLOOD PRESSURE BOTTOM OUT  . Meperidine Hcl   . Actos [Pioglitazone] Other (See Comments) and Hives  . Dapagliflozin Rash  . Penicillins Rash    I personally reviewed active problem list, medication list, allergies, family history, social history, health maintenance with the patient/caregiver today.   ROS  Constitutional: Negative for fever or weight change.  Respiratory: Negative for cough and shortness of breath.   Cardiovascular: Negative for chest pain or palpitations.  Gastrointestinal: Negative for abdominal pain, no bowel changes.  Musculoskeletal: Negative for gait problem or joint swelling.  Skin: Negative for rash.  Neurological: Negative for dizziness or headache.  No other specific complaints in a complete review of systems (except as listed in HPI above).   Objective  Vitals:   10/30/19 1315  BP: 124/74  Pulse: 70  Resp: 16  Temp: 97.8 F (36.6 C)  TempSrc: Temporal  SpO2: 98%  Weight: 249 lb 4.8 oz (113.1 kg)  Height: 6' 3.5" (1.918 m)    Body mass index is 30.75 kg/m.  Physical Exam  Constitutional: Patient appears well-developed and well-nourished. Obese  No distress.  HEENT: head atraumatic, normocephalic, strabismus ,  neck supple Cardiovascular: Normal rate, regular rhythm and normal heart sounds.  Trace of  murmur heard. 1 plus  BLE edema. Pulmonary/Chest: Effort normal and breath sounds normal. No respiratory distress. Abdominal: Soft.  There is no tenderness. Psychiatric: Patient has a normal mood and affect. behavior is normal. Judgment and thought content normal.  PHQ2/9: Depression screen [facility] 2/9 10/30/2019 03/03/2019 02/28/2019 11/16/2018 05/17/2018  Decreased  Interest 0 0 0 0 0  Down, Depressed, Hopeless 0 0 0 0 0  PHQ - 2 Score 0 0 0 0 0  Altered sleeping 0 0 - 0 0  Tired, decreased energy 0 0 - 0 0  Change in appetite 0 0 - 0 0  Feeling bad or failure about yourself  0 0 - 0 0  Trouble concentrating 0 0 - 0 0  Moving slowly or fidgety/restless 0 0 - 0 0  Suicidal thoughts 0 0 - 0 0  PHQ-9 Score 0 0 - 0 0  Difficult doing work/chores Not difficult  at all Not difficult at all - Not difficult at all Not difficult at all  Some recent data might be hidden    phq 9 is negative   Fall Risk: Fall Risk  10/30/2019 05/01/2019 03/03/2019 02/28/2019 11/16/2018  Falls in the past year? 0 - 0 0 0  Number falls in past yr: 0 0 0 0 0  Injury with Fall? 0 0 0 0 0  Risk for fall due to : - - - - -  Risk for fall due to: Comment - - - - -     Functional Status Survey: Is the patient deaf or have difficulty hearing?: No Does the patient have difficulty seeing, even when wearing glasses/contacts?: No Does the patient have difficulty concentrating, remembering, or making decisions?: Yes(remembering) Does the patient have difficulty walking or climbing stairs?: No Does the patient have difficulty dressing or bathing?: No Does the patient have difficulty doing errands alone such as visiting a doctor's office or shopping?: No    Assessment & Plan  1. Benign essential HTN  - telmisartan-hydrochlorothiazide (MICARDIS HCT) 80-25 MG tablet; Take 1 tablet by mouth daily.  Dispense: 90 tablet; Refill: 1 - COMPLETE METABOLIC PANEL WITH GFR  2. Hypertension associated with diabetes ([place])  - telmisartan-hydrochlorothiazide (MICARDIS HCT) 80-25 MG tablet; Take 1 tablet by mouth daily.  Dispense: 90 tablet; Refill: 1  3. Type 2 diabetes mellitus with microalbuminuria, with long-term current use of insulin (HCC)  - telmisartan-hydrochlorothiazide (MICARDIS HCT) 80-25 MG tablet; Take 1 tablet by mouth daily.  Dispense: 90 tablet; Refill: 1  4. Mixed  hyperlipidemia  - Lipid panel - rosuvastatin (CRESTOR) 10 MG tablet; Take 1 tablet (10 mg total) by mouth at bedtime.  Dispense: 90 tablet; Refill: 1 - omega-3 acid ethyl esters (LOVAZA) 1 g capsule; Take 2 capsules (2 g total) by mouth 2 (two) times daily.  Dispense: 360 capsule; Refill: 1   5. Atherosclerosis of aorta ([place])  On statin therapy   6. Type 2 diabetes, uncontrolled, with neuropathy [facility])  He sees Dr. Cassell Smiles   7. Bilateral lower extremity edema  Wearing compression stocking hoses  8. Benign prostatic hyperplasia without lower urinary tract symptoms  - finasteride (PROSCAR) 5 MG tablet; Take 1 tablet (5 mg total) by mouth daily.  Dispense: 90 tablet; Refill: 1  9. Skin lesion of face  - Ambulatory referral to Dermatology  10. Dyslipidemia associated with type 2 diabetes mellitus (HCC)  - Lipid panel - rosuvastatin (CRESTOR) 10 MG tablet; Take 1 tablet (10 mg total) by mouth at bedtime.  Dispense: 90 tablet; Refill: 1 - omega-3 acid ethyl esters (LOVAZA) 1 g capsule; Take 2 capsules (2 g total) by mouth 2 (two) times daily.  Dispense: 360 capsule; Refill: 1

## 2019-11-29 LAB — HEMOGLOBIN A1C: Hemoglobin A1C: 6.5

## 2019-12-05 ENCOUNTER — Telehealth: Payer: Self-pay | Admitting: Family Medicine

## 2019-12-05 NOTE — Chronic Care Management (AMB) (Signed)
  Chronic Care Management   Outreach Note  12/05/2019 Name: Jeremy [name] MRN: [id number] DOB: [birth year]  Jeremy [name] is a 84 y.o. year old male who is a primary care patient of Steele Sizer, MD. I reached out to Jeremy [name] by phone today in response to a referral sent by [name]'s health plan.     An unsuccessful telephone outreach was attempted today. The patient was referred to the case management team for assistance with care management and care coordination.   Follow Up Plan: A HIPPA compliant phone message was left for the patient providing contact information and requesting a return call.  The care management team will reach out to the patient again over the next 7 days.  If patient returns call to provider office, please advise to call [facility]  at [place], [place], [place], [place] [address] Direct Dial: [phone] [email or web address]: [email or web address]

## 2019-12-19 NOTE — Chronic Care Management (AMB) (Signed)
  Chronic Care Management   Note  12/19/2019 Name: Jeremy [name] MRN: [id number] DOB: Sep 03, 1935  Jeremy [name] is a 84 y.o. year old male who is a primary care patient of Steele Sizer, MD. I reached out to Jeremy [name] by phone today in response to a referral sent by Jeremy [name]'s health plan.     Jeremy [name] was given information about Chronic Care Management services today including:  1. CCM service includes personalized support from designated clinical staff supervised by his physician, including individualized plan of care and coordination with other care providers 2. 24/7 contact phone numbers for assistance for urgent and routine care needs. 3. Service will only be billed when office clinical staff spend 20 minutes or more in a month to coordinate care. 4. Only one practitioner may furnish and bill the service in a calendar month. 5. The patient may stop CCM services at any time (effective at the end of the month) by phone call to the office staff. 6. The patient will be responsible for cost sharing (co-pay) of up to 20% of the service fee (after annual deductible is met).  Patient did not agree to enrollment in care management services and does not wish to consider at this time.  Follow up plan: The patient has been provided with contact information for the care management team and has been advised to call with any health related questions or concerns.   Jeremy [name], [place], [place], [place] [address] Direct Dial: [phone] [name]: [email or web address]

## 2020-01-02 DIAGNOSIS — I152 Hypertension secondary to endocrine disorders: Secondary | ICD-10-CM | POA: Diagnosis not present

## 2020-01-02 DIAGNOSIS — E1159 Type 2 diabetes mellitus with other circulatory complications: Secondary | ICD-10-CM | POA: Diagnosis not present

## 2020-01-02 DIAGNOSIS — I700 Atherosclerosis of aorta: Secondary | ICD-10-CM | POA: Diagnosis not present

## 2020-01-02 DIAGNOSIS — E1142 Type 2 diabetes mellitus with diabetic polyneuropathy: Secondary | ICD-10-CM | POA: Diagnosis not present

## 2020-01-02 DIAGNOSIS — R0602 Shortness of breath: Secondary | ICD-10-CM | POA: Diagnosis not present

## 2020-01-02 DIAGNOSIS — Z794 Long term (current) use of insulin: Secondary | ICD-10-CM | POA: Diagnosis not present

## 2020-01-06 ENCOUNTER — Other Ambulatory Visit: Payer: Self-pay | Admitting: Family Medicine

## 2020-01-06 DIAGNOSIS — I10 Essential (primary) hypertension: Secondary | ICD-10-CM

## 2020-01-06 NOTE — Telephone Encounter (Signed)
Requested Prescriptions  Pending Prescriptions Disp Refills  . bisoprolol (ZEBETA) 10 MG tablet [Pharmacy Med Name: Bisoprolol Fumarate 10 MG Oral Tablet] 90 tablet 0    Sig: Take 1 tablet by mouth once daily     Cardiovascular:  Beta Blockers Passed - 01/06/2020 11:01 AM      Passed - Last BP in normal range    BP Readings from Last 1 Encounters:  10/30/19 124/74         Passed - Last Heart Rate in normal range    Pulse Readings from Last 1 Encounters:  10/30/19 70         Passed - Valid encounter within last 6 months    Recent Outpatient Visits          2 months ago Mixed hyperlipidemia   [facility] [place], Drue Stager, MD   8 months ago Skin ulcer of left lower leg, unspecified ulcer stage [facility])   [facility] Steele Sizer, MD   10 months ago Skin ulcer of left lower leg, unspecified ulcer stage [facility])   [facility] Steele Sizer, MD   1 year ago Controlled type 2 diabetes mellitus with diabetic autonomic neuropathy, with long-term current use of insulin [facility])   [facility] Steele Sizer, MD   1 year ago Right flank pain   [facility] Steele Sizer, MD      Future Appointments            In 3 weeks Ralene Bathe, MD [place]   In 1 month  [facility] [place]   In 3 months Steele Sizer, MD [facility], [facility]

## 2020-01-09 ENCOUNTER — Telehealth: Payer: Self-pay

## 2020-01-09 NOTE — Telephone Encounter (Signed)
Copied from [place] [id number]. Topic: Appointment Scheduling - Scheduling Inquiry for Clinic >> Jan 09, 2020  9:48 AM Jeremy [name] wrote: Patient wants to know if there is anyway Dr. Ancil Boozer can work him in today for [name]. Advised patient of next available opening tomorrow, patient insisted [name] message be sent back today >> Jan 09, 2020 11:19 AM Karene Fry P wrote: Informed pt that we are currently booked until Thursday. He'd like to know if Dr Ancil Boozer would call in an antibiotic. Stated he has been vaccinated. [place] all [place]. He is experiencing drainage that goes into throat, cough. It started are [name] regular nose cold. Think it may be bronchitis. Please send to [email or web address]

## 2020-01-10 ENCOUNTER — Ambulatory Visit (INDEPENDENT_AMBULATORY_CARE_PROVIDER_SITE_OTHER): Payer: Medicare Other | Admitting: Family Medicine

## 2020-01-10 ENCOUNTER — Other Ambulatory Visit: Payer: Self-pay

## 2020-01-10 ENCOUNTER — Encounter: Payer: Self-pay | Admitting: Family Medicine

## 2020-01-10 VITALS — Ht 76.0 in | Wt 245.0 lb

## 2020-01-10 DIAGNOSIS — R05 Cough: Secondary | ICD-10-CM | POA: Diagnosis not present

## 2020-01-10 DIAGNOSIS — R058 Other specified cough: Secondary | ICD-10-CM

## 2020-01-10 MED ORDER — DOXYCYCLINE HYCLATE 100 MG PO TABS: 100.0000 mg | ORAL_TABLET | Freq: Two times a day (BID) | ORAL | 0 refills | Status: DC

## 2020-01-10 NOTE — Progress Notes (Signed)
Name: Jeremy [name]   MRN: [id number]    DOB: [birth year]   Date:01/10/2020       Progress Note  Subjective  Chief Complaint  Chief Complaint  Patient presents with  . URI    onset 2 weeks, current symptoms: nasal congestion, sore throat, cough. has had covid vaccine but has not been tested    I connected with  Jeremy [name] on 01/10/20 at 11:40 AM EDT by telephone and verified that I am speaking with the correct person using two identifiers.   I discussed the limitations, risks, security and privacy concerns of performing an evaluation and management service by telephone and the availability of in person appointments. Staff also discussed with the patient that there may be a patient responsible charge related to this service. Patient Location: at home  Provider Location: [facility]    HPI  Cough : he states he had COVID-19 vaccine, he was exposed to his daughter with similar symptoms 2 weeks ago. His symptoms started about 10 days ago. Initially with clear  rhinorrhea , congestion, followed by sore throat. He states now his symptoms are down to his chest, cough is described as a productive cough. He denies fever , he had some chills initially but has resolved. He denies SOB. No rashes or change in bowel movements His worse symptom now is the cough.  He has DM, he states his glucose has not been affected, it was 91 fasting today   Patient Active Problem List   Diagnosis Date Noted  . Bicipital tenosynovitis 11/16/2018  . Impingement syndrome of shoulder region 11/16/2018  . Atherosclerosis of aorta ([place]) 07/21/2018  . History of diverticulitis of colon 11/15/2017  . Hyperlipidemia due to type 2 diabetes mellitus ([place]) 03/14/2017  . Bilateral lower extremity edema 03/27/2016  . Strain of hamstring muscle 02/21/2016  . Vitamin D deficiency 12/05/2015  . Incomplete emptying of bladder 10/11/2015  . Hx of transient ischemic attack (TIA) 09/17/2015  . Transient vision disturbance of  both eyes 08/28/2015  . Stroke-like symptoms 08/28/2015  . Diabetes mellitus type 2 without retinopathy ([place]) 05/31/2015  . Hyperlipidemia 05/30/2015  . Abnormal CT of the chest 05/15/2015  . Abnormal abdominal CT scan 05/15/2015  . Chronic kidney disease (CKD), stage I 01/08/2015  . Type II diabetes mellitus with renal manifestations, uncontrolled ([place]) 01/08/2015  . Type 2 diabetes, uncontrolled, with neuropathy ([place]) 01/08/2015  . Obesity 01/08/2015  . Proteinuria 01/08/2015  . Atrophy of vocal cord 02/07/2014  . Incisional hernia, without obstruction or gangrene 03/23/2013  . Enlarged prostate with lower urinary tract symptoms (LUTS) 11/03/2012  . Myopic degeneration, bilateral 08/12/2012  . Nonexudative age-related macular degeneration 07/15/2012  . Anisometropia 04/08/2012  . Pseudoaphakia 04/08/2012  . History of retinal detachment 04/08/2012  . Presence of intraocular lens 04/08/2012  . Bladder diverticulum 02/12/2012  . Diverticulum of bladder 02/12/2012  . Dysphonia 02/08/2012  . Secondary open-angle glaucoma of left eye, severe stage 01/13/2012  . Osteopenia 03/18/2010  . Gastro-esophageal reflux disease with esophagitis 02/07/2008  . Benign essential HTN 02/03/2007  . Hypertension associated with diabetes ([place]) 02/03/2007    Social History   Tobacco Use  . Smoking status: Never Smoker  . Smokeless tobacco: Never Used  . Tobacco comment: smoking cessation materials not required  Substance Use Topics  . Alcohol use: No     Current Outpatient Medications:  .  bisoprolol (ZEBETA) 10 MG tablet, Take 1 tablet by mouth once daily, Disp: 90 tablet, Rfl:  0 .  brimonidine (ALPHAGAN P) 0.1 % SOLN, Place 1 drop into the left eye 2 (two) times daily. , Disp: , Rfl:  .  desonide (DESOWEN) 0.05 % cream, APPLY A SMALL AMOUNT TO THE AFFECTED AREA ONCE DAILY, Disp: , Rfl:  .  dorzolamide (TRUSOPT) 2 % ophthalmic solution, Place 1 drop into the left eye 2 (two) times daily.,  Disp: , Rfl: 3 .  esomeprazole (NEXIUM) 20 MG capsule, Take 20 mg by mouth daily., Disp: , Rfl:  .  Exenatide ER (BYDUREON BCISE) 2 MG/0.85ML AUIJ, Inject into the skin., Disp: , Rfl:  .  finasteride (PROSCAR) 5 MG tablet, Take 1 tablet (5 mg total) by mouth daily., Disp: 90 tablet, Rfl: 1 .  INVOKANA 300 MG TABS tablet, TAKE ONE TABLET BY MOUTH ONCE DAILY, Disp: 90 tablet, Rfl: 0 .  metFORMIN (GLUCOPHAGE) 1000 MG tablet, Take 1 tablet (1,000 mg total) by mouth 2 (two) times daily., Disp: 180 tablet, Rfl: 1 .  naproxen sodium (ALEVE) 220 MG tablet, Take 1 tablet (220 mg total) by mouth daily as needed., Disp: 30 tablet, Rfl: 0 .  omega-3 acid ethyl esters (LOVAZA) 1 g capsule, Take 2 capsules (2 g total) by mouth 2 (two) times daily., Disp: 360 capsule, Rfl: 1 .  rosuvastatin (CRESTOR) 10 MG tablet, Take 1 tablet (10 mg total) by mouth at bedtime., Disp: 90 tablet, Rfl: 1 .  tadalafil (CIALIS) 20 MG tablet, Take 0.5-1 tablets (10-20 mg total) by mouth every other day as needed for erectile dysfunction., Disp: 5 tablet, Rfl: 5 .  telmisartan-hydrochlorothiazide (MICARDIS HCT) 80-25 MG tablet, Take 1 tablet by mouth daily., Disp: 90 tablet, Rfl: 1 .  timolol (TIMOPTIC) 0.5 % ophthalmic solution, INSTILL 1 DROP INTO LEFT EYE TWICE DAILY, Disp: , Rfl:  .  TRESIBA FLEXTOUCH 200 UNIT/ML SOPN, Inject 76 Units into the skin daily., Disp: , Rfl:   Allergies  Allergen Reactions  . Demerol [Meperidine] Other (See Comments) and Anaphylaxis    MAKES BLOOD PRESSURE BOTTOM OUT hypotension  . Dilaudid [Hydromorphone Hcl]   . Hydromorphone     Other reaction(s): Unknown MAKES BLOOD PRESSURE BOTTOM OUT  . Meperidine Hcl   . Actos [Pioglitazone] Other (See Comments) and Hives  . Dapagliflozin Rash  . Penicillins Rash    I personally reviewed active problem list, medication list, allergies, family history, social history with the patient/caregiver today.  ROS  Ten systems reviewed and is negative except  as mentioned in HPI   Objective  Virtual encounter, vitals not obtained.  Body mass index is 29.82 kg/m.  Nursing Note and Vital Signs reviewed.  Physical Exam  Awake, alert and oriented   Assessment & Plan  1. Productive cough  - doxycycline (VIBRA-TABS) 100 MG tablet; Take 1 tablet (100 mg total) by mouth 2 (two) times daily.  Dispense: 14 tablet; Refill: 0  Explained to the patient likely viral URI and antibiotic is not indicated. He is a physician and insisted. He refused benzonate or inhalers.   I also recommended COVID testing - he states " likely viral but not COVID" , discussed self isolation    -Red flags and when to present for emergency care or RTC including fever >101.60F, chest pain, shortness of breath, new/worsening/un-resolving symptoms, reviewed with patient at time of visit. Follow up and care instructions discussed and provided in AVS. - I discussed the assessment and treatment plan with the patient. The patient was provided an opportunity to ask questions and all were  answered. The patient agreed with the plan and demonstrated an understanding of the instructions.  - The patient was advised to call back or seek an in-person evaluation if the symptoms worsen or if the condition fails to improve as anticipated.  I provided 15  minutes of non-face-to-face time during this encounter.  Loistine Chance, MD

## 2020-01-29 ENCOUNTER — Other Ambulatory Visit: Payer: Self-pay

## 2020-01-29 ENCOUNTER — Ambulatory Visit (INDEPENDENT_AMBULATORY_CARE_PROVIDER_SITE_OTHER): Payer: Medicare Other | Admitting: Dermatology

## 2020-01-29 DIAGNOSIS — T490X5A Adverse effect of local antifungal, anti-infective and anti-inflammatory drugs, initial encounter: Secondary | ICD-10-CM | POA: Diagnosis not present

## 2020-01-29 DIAGNOSIS — L578 Other skin changes due to chronic exposure to nonionizing radiation: Secondary | ICD-10-CM

## 2020-01-29 DIAGNOSIS — L244 Irritant contact dermatitis due to drugs in contact with skin: Secondary | ICD-10-CM | POA: Diagnosis not present

## 2020-01-29 DIAGNOSIS — L820 Inflamed seborrheic keratosis: Secondary | ICD-10-CM | POA: Diagnosis not present

## 2020-01-29 DIAGNOSIS — L738 Other specified follicular disorders: Secondary | ICD-10-CM | POA: Diagnosis not present

## 2020-01-29 DIAGNOSIS — L219 Seborrheic dermatitis, unspecified: Secondary | ICD-10-CM | POA: Diagnosis not present

## 2020-01-29 DIAGNOSIS — L570 Actinic keratosis: Secondary | ICD-10-CM

## 2020-01-29 DIAGNOSIS — L821 Other seborrheic keratosis: Secondary | ICD-10-CM

## 2020-01-29 MED ORDER — DESONIDE 0.05 % EX OINT: TOPICAL_OINTMENT | CUTANEOUS | 1 refills | Status: DC

## 2020-01-29 MED ORDER — KETOCONAZOLE 2 % EX CREA: TOPICAL_CREAM | CUTANEOUS | 1 refills | Status: DC

## 2020-01-29 MED ORDER — HYDROCORTISONE 2.5 % EX CREA: TOPICAL_CREAM | CUTANEOUS | 1 refills | Status: DC

## 2020-01-29 NOTE — Progress Notes (Signed)
New Patient Visit  Subjective  Jeremy [name] is a 84 y.o. male who presents for the following: Lesions (on the face - scaly, irregular, patient is concerned and would like them treated), pink scaliness (of the face - patient is concerned he may have seborrheic dermatitis and would like to discuss treatment options), and irritation (from eye drops - patient used Desonide ointment in the past and would like a refill. He has used the cream but doesn't feel it works as well).  The following portions of the chart were reviewed this encounter and updated as appropriate:  Tobacco   Allergies   Meds   Problems   Med Hx   Surg Hx   Fam Hx      Review of Systems:  No other skin or systemic complaints except as noted in HPI or Assessment and Plan.  Objective  Well appearing patient in no apparent distress; mood and affect are within normal limits.  A focused examination was performed including the face . Relevant physical exam findings are noted in the Assessment and Plan.  Objective  Head - Anterior (Face): Pink patches with greasy scale.   Objective  Face: Small yellow papules with a central dell.   Objective  Forehead x 4, L infraorbital/cheek (5): Erythematous keratotic or waxy stuck-on papule or plaque.   Objective  L forehead above the eyebrow, R ear (2): Erythematous thin papules/macules with gritty scale.   Objective  Head - Anterior (Face): Erythema and scale   Assessment & Plan  Seborrheic dermatitis Head - Anterior (Face) Start HC 2.5% cream QOhs alternating with Ketoconazole 2% cream QOhs  hydrocortisone 2.5 % cream - Head - Anterior (Face)  ketoconazole (NIZORAL) 2 % cream - Head - Anterior (Face)  Sebaceous hyperplasia Face Benign, observe.    Inflamed seborrheic keratosis (5) Forehead x 4, L infraorbital/cheek  Destruction of lesion - Forehead x 4, L infraorbital/cheek Complexity: simple   Destruction method: cryotherapy   Informed consent: discussed  and consent obtained   Timeout:  patient name, date of birth, surgical site, and procedure verified Lesion destroyed using liquid nitrogen: Yes   Region frozen until ice ball extended beyond lesion: Yes   Outcome: patient tolerated procedure well with no complications   Post-procedure details: wound care instructions given    AK (actinic keratosis) (2) L forehead above the eyebrow, R ear  Destruction of lesion - L forehead above the eyebrow, R ear Complexity: simple   Destruction method: cryotherapy   Informed consent: discussed and consent obtained   Timeout:  patient name, date of birth, surgical site, and procedure verified Lesion destroyed using liquid nitrogen: Yes   Region frozen until ice ball extended beyond lesion: Yes   Outcome: patient tolerated procedure well with no complications   Post-procedure details: wound care instructions given    Irritant contact dermatitis due to drug in contact with skin Head - Anterior (Face) From eyedrops -  Continue Desonide QD-BID but change from cream to ointment since patient feels the ointment works better.  desonide (DESOWEN) 0.05 % ointment - Head - Anterior (Face)  Seborrheic Keratoses - Stuck-on, waxy, tan-brown papules and plaques  - Discussed benign etiology and prognosis. - Observe - Call for any changes  Actinic Damage - diffuse scaly erythematous macules with underlying dyspigmentation - Recommend daily broad spectrum sunscreen SPF 30+ to sun-exposed areas, reapply every 2 hours as needed.  - Call for new or changing lesions.  Return in about 2 months (around 03/30/2020).  Luther Redo, CMA, am acting as scribe for Sarina Ser, MD .  Documentation: I have reviewed the above documentation for accuracy and completeness, and I agree with the above.  Sarina Ser, MD

## 2020-02-01 ENCOUNTER — Encounter: Payer: Self-pay | Admitting: Dermatology

## 2020-02-13 DIAGNOSIS — Z23 Encounter for immunization: Secondary | ICD-10-CM | POA: Diagnosis not present

## 2020-02-28 DIAGNOSIS — B351 Tinea unguium: Secondary | ICD-10-CM | POA: Diagnosis not present

## 2020-02-28 DIAGNOSIS — E1142 Type 2 diabetes mellitus with diabetic polyneuropathy: Secondary | ICD-10-CM | POA: Diagnosis not present

## 2020-02-28 DIAGNOSIS — E785 Hyperlipidemia, unspecified: Secondary | ICD-10-CM | POA: Diagnosis not present

## 2020-02-28 DIAGNOSIS — I10 Essential (primary) hypertension: Secondary | ICD-10-CM | POA: Diagnosis not present

## 2020-02-28 DIAGNOSIS — E1169 Type 2 diabetes mellitus with other specified complication: Secondary | ICD-10-CM | POA: Diagnosis not present

## 2020-02-28 DIAGNOSIS — E782 Mixed hyperlipidemia: Secondary | ICD-10-CM | POA: Diagnosis not present

## 2020-02-29 ENCOUNTER — Ambulatory Visit (INDEPENDENT_AMBULATORY_CARE_PROVIDER_SITE_OTHER): Payer: Medicare Other

## 2020-02-29 ENCOUNTER — Other Ambulatory Visit: Payer: Self-pay

## 2020-02-29 VITALS — BP 108/76 | HR 64 | Temp 98.1°F | Resp 16 | Ht 76.0 in | Wt 249.2 lb

## 2020-02-29 DIAGNOSIS — Z0000 Encounter for general adult medical examination without abnormal findings: Secondary | ICD-10-CM | POA: Diagnosis not present

## 2020-02-29 LAB — LIPID PANEL
Cholesterol: 115 mg/dL (ref <200)
HDL: 42 mg/dL (ref >=40)
LDL Cholesterol (Calc): 51 mg/dL (calc)
Non-HDL Cholesterol (Calc): 73 mg/dL (calc) (ref <130)
Total CHOL/HDL Ratio: 2.7 (calc) (ref <5.0)
Triglycerides: 141 mg/dL (ref <150)

## 2020-02-29 LAB — COMPLETE METABOLIC PANEL WITH GFR
AG Ratio: 2.1 (calc) (ref 1.0–2.5)
ALT: 16 U/L (ref 9–46)
AST: 15 U/L (ref 10–35)
Albumin: 4.5 g/dL (ref 3.6–5.1)
Alkaline phosphatase (APISO): 38 U/L (ref 35–144)
BUN: 24 mg/dL (ref 7–25)
CO2: 30 mmol/L (ref 20–32)
Calcium: 9.3 mg/dL (ref 8.6–10.3)
Chloride: 103 mmol/L (ref 98–110)
Creat: 0.76 mg/dL (ref 0.70–1.11)
GFR, Est African American: 99 mL/min/{1.73_m2} (ref >=60)
GFR, Est Non African American: 86 mL/min/{1.73_m2} (ref >=60)
Globulin: 2.1 g/dL (calc) (ref 1.9–3.7)
Glucose, Bld: 91 mg/dL (ref 65–99)
Potassium: 4.1 mmol/L (ref 3.5–5.3)
Sodium: 141 mmol/L (ref 135–146)
Total Bilirubin: 1 mg/dL (ref 0.2–1.2)
Total Protein: 6.6 g/dL (ref 6.1–8.1)

## 2020-02-29 NOTE — Patient Instructions (Signed)
[name] , Thank you for taking time to come for your Medicare Wellness Visit. I appreciate your ongoing commitment to your health goals. Please review the following plan we discussed and let me know if I can assist you in the future.   Screening recommendations/referrals: Colonoscopy: done 08/16/17 Recommended yearly ophthalmology/optometry visit for glaucoma screening and checkup Recommended yearly dental visit for hygiene and checkup  Vaccinations: Influenza vaccine: done - please bring vaccination record to your next appt Pneumococcal vaccine: done 09/04/13 Tdap vaccine: done 04/04/12 Shingles vaccine: done 12/14/17 & 02/21/18  Covid-19: done 06/21/19 & 07/12/19  Conditions/risks identified: Recommend increasing physical activity   Next appointment: Follow up in one year for your annual wellness visit.   Preventive Care 82 Years and Older, Male Preventive care refers to lifestyle choices and visits with your health care provider that can promote health and wellness. What does preventive care include?  A yearly physical exam. This is also called an annual well check.  Dental exams once or twice a year.  Routine eye exams. Ask your health care provider how often you should have your eyes checked.  Personal lifestyle choices, including:  Daily care of your teeth and gums.  Regular physical activity.  Eating a healthy diet.  Avoiding tobacco and drug use.  Limiting alcohol use.  Practicing safe sex.  Taking low doses of aspirin every day.  Taking vitamin and mineral supplements as recommended by your health care provider. What happens during an annual well check? The services and screenings done by your health care provider during your annual well check will depend on your age, overall health, lifestyle risk factors, and family history of disease. Counseling  Your health care provider may ask you questions about your:  Alcohol use.  Tobacco use.  Drug use.  Emotional  well-being.  Home and relationship well-being.  Sexual activity.  Eating habits.  History of falls.  Memory and ability to understand (cognition).  Work and work Statistician. Screening  You may have the following tests or measurements:  Height, weight, and BMI.  Blood pressure.  Lipid and cholesterol levels. These may be checked every 5 years, or more frequently if you are over 44 years old.  Skin check.  Lung cancer screening. You may have this screening every year starting at age 6 if you have a 30-pack-year history of smoking and currently smoke or have quit within the past 82 years.  Fecal occult blood test (FOBT) of the stool. You may have this test every year starting at age 69.  Flexible sigmoidoscopy or colonoscopy. You may have a sigmoidoscopy every 5 years or a colonoscopy every 10 years starting at age 82.  Prostate cancer screening. Recommendations will vary depending on your family history and other risks.  Hepatitis C blood test.  Hepatitis B blood test.  Sexually transmitted disease (STD) testing.  Diabetes screening. This is done by checking your blood sugar (glucose) after you have not eaten for a while (fasting). You may have this done every 1-3 years.  Abdominal aortic aneurysm (AAA) screening. You may need this if you are a current or former smoker.  Osteoporosis. You may be screened starting at age 82 if you are at high risk. Talk with your health care provider about your test results, treatment options, and if necessary, the need for more tests. Vaccines  Your health care provider may recommend certain vaccines, such as:  Influenza vaccine. This is recommended every year.  Tetanus, diphtheria, and acellular pertussis (Tdap,  Td) vaccine. You may need a Td booster every 10 years.  Zoster vaccine. You may need this after age 82.  Pneumococcal 13-valent conjugate (PCV13) vaccine. One dose is recommended after age 82.  Pneumococcal  polysaccharide (PPSV23) vaccine. One dose is recommended after age 82. Talk to your health care provider about which screenings and vaccines you need and how often you need them. This information is not intended to replace advice given to you by your health care provider. Make sure you discuss any questions you have with your health care provider. Document Released: 06/28/2015 Document Revised: 02/19/2016 Document Reviewed: 04/02/2015 Elsevier Interactive Patient Education  2017 [place] Prevention in the Home Falls can cause injuries. They can happen to people of all ages. There are many things you can do to make your home safe and to help prevent falls. What can I do on the outside of my home?  Regularly fix the edges of walkways and driveways and fix any cracks.  Remove anything that might make you trip as you walk through a door, such as a raised step or threshold.  Trim any bushes or trees on the path to your home.  Use bright outdoor lighting.  Clear any walking paths of anything that might make someone trip, such as rocks or tools.  Regularly check to see if handrails are loose or broken. Make sure that both sides of any steps have handrails.  Any raised decks and porches should have guardrails on the edges.  Have any leaves, snow, or ice cleared regularly.  Use sand or salt on walking paths during winter.  Clean up any spills in your garage right away. This includes oil or grease spills. What can I do in the bathroom?  Use night lights.  Install grab bars by the toilet and in the tub and shower. Do not use towel bars as grab bars.  Use non-skid mats or decals in the tub or shower.  If you need to sit down in the shower, use a plastic, non-slip stool.  Keep the floor dry. Clean up any water that spills on the floor as soon as it happens.  Remove soap buildup in the tub or shower regularly.  Attach bath mats securely with double-sided non-slip rug  tape.  Do not have throw rugs and other things on the floor that can make you trip. What can I do in the bedroom?  Use night lights.  Make sure that you have a light by your bed that is easy to reach.  Do not use any sheets or blankets that are too big for your bed. They should not hang down onto the floor.  Have a firm chair that has side arms. You can use this for support while you get dressed.  Do not have throw rugs and other things on the floor that can make you trip. What can I do in the kitchen?  Clean up any spills right away.  Avoid walking on wet floors.  Keep items that you use a lot in easy-to-reach places.  If you need to reach something above you, use a strong step stool that has a grab bar.  Keep electrical cords out of the way.  Do not use floor polish or wax that makes floors slippery. If you must use wax, use non-skid floor wax.  Do not have throw rugs and other things on the floor that can make you trip. What can I do with my stairs?  Do not  leave any items on the stairs.  Make sure that there are handrails on both sides of the stairs and use them. Fix handrails that are broken or loose. Make sure that handrails are as long as the stairways.  Check any carpeting to make sure that it is firmly attached to the stairs. Fix any carpet that is loose or worn.  Avoid having throw rugs at the top or bottom of the stairs. If you do have throw rugs, attach them to the floor with carpet tape.  Make sure that you have a light switch at the top of the stairs and the bottom of the stairs. If you do not have them, ask someone to add them for you. What else can I do to help prevent falls?  Wear shoes that:  Do not have high heels.  Have rubber bottoms.  Are comfortable and fit you well.  Are closed at the toe. Do not wear sandals.  If you use a stepladder:  Make sure that it is fully opened. Do not climb a closed stepladder.  Make sure that both sides of the  stepladder are locked into place.  Ask someone to hold it for you, if possible.  Clearly mark and make sure that you can see:  Any grab bars or handrails.  First and last steps.  Where the edge of each step is.  Use tools that help you move around (mobility aids) if they are needed. These include:  Canes.  Walkers.  Scooters.  Crutches.  Turn on the lights when you go into a dark area. Replace any light bulbs as soon as they burn out.  Set up your furniture so you have a clear path. Avoid moving your furniture around.  If any of your floors are uneven, fix them.  If there are any pets around you, be aware of where they are.  Review your medicines with your doctor. Some medicines can make you feel dizzy. This can increase your chance of falling. Ask your doctor what other things that you can do to help prevent falls. This information is not intended to replace advice given to you by your health care provider. Make sure you discuss any questions you have with your health care provider. Document Released: 03/28/2009 Document Revised: 11/07/2015 Document Reviewed: 07/06/2014 Elsevier Interactive Patient Education  2017 [employer].

## 2020-02-29 NOTE — Progress Notes (Signed)
Subjective:   KOLLYN LINGAFELTER is a 84 y.o. male who presents for Medicare Annual/Subsequent preventive examination.  Review of Systems     Cardiac Risk Factors include: diabetes mellitus;advanced age (>93men, >12 women);dyslipidemia;male gender;hypertension;obesity (BMI >30kg/m2)     Objective:    Today's Vitals   02/29/20 1410  BP: 108/76  Pulse: 64  Resp: 16  Temp: 98.1 F (36.7 C)  TempSrc: Oral  SpO2: 98%  Weight: 249 lb 3.2 oz (113 kg)  Height: 6\' 4"  (1.93 m)   Body mass index is 30.33 kg/m.  Advanced Directives 02/29/2020 02/28/2019 02/25/2018 08/16/2017 04/08/2017 04/08/2017 02/22/2017  Does Patient Have a Medical Advance Directive? Yes Yes Yes Yes Yes Yes Yes  Type of Paramedic of [place];Living will [place];Living will [place];Living will [place];Living will [place];Living will [place];Living will [place];Living will  Does patient want to make changes to medical advance directive? - - - - - - -  Copy of [place] in Chart? Yes - validated most recent copy scanned in chart (See row information) Yes - validated most recent copy scanned in chart (See row information) Yes No - copy requested No - copy requested No - copy requested No - copy requested  Would patient like information on creating a medical advance directive? - - - - - - -    Current Medications (verified) Outpatient Encounter Medications as of 02/29/2020  Medication Sig  . bisoprolol (ZEBETA) 10 MG tablet Take 1 tablet by mouth once daily  . brimonidine (ALPHAGAN P) 0.1 % SOLN Place 1 drop into the left eye 2 (two) times daily.   Marland Kitchen desonide (DESOWEN) 0.05 % cream APPLY A SMALL AMOUNT TO THE AFFECTED AREA ONCE DAILY  . desonide (DESOWEN) 0.05 % ointment Apply to rash under eye QD-BID PRN  . dorzolamide (TRUSOPT) 2 % ophthalmic solution Place 1  drop into the left eye 2 (two) times daily.  . Exenatide ER (BYDUREON BCISE) 2 MG/0.85ML AUIJ Inject into the skin.  . finasteride (PROSCAR) 5 MG tablet Take 1 tablet (5 mg total) by mouth daily.  . hydrocortisone 2.5 % cream Apply to aa's face and ears every other night alternating with Ketoconazole cream  . INVOKANA 300 MG TABS tablet TAKE ONE TABLET BY MOUTH ONCE DAILY  . ketoconazole (NIZORAL) 2 % cream Apply to aa's face and ears every other night alternating with Hydrocortisone cream.  . metFORMIN (GLUCOPHAGE) 1000 MG tablet Take 1 tablet (1,000 mg total) by mouth 2 (two) times daily.  . naproxen sodium (ALEVE) 220 MG tablet Take 1 tablet (220 mg total) by mouth daily as needed.  Marland Kitchen omega-3 acid ethyl esters (LOVAZA) 1 g capsule Take 2 capsules (2 g total) by mouth 2 (two) times daily.  Marland Kitchen omeprazole (PRILOSEC) 20 MG capsule Take 20 mg by mouth daily. OTC  . rosuvastatin (CRESTOR) 10 MG tablet Take 1 tablet (10 mg total) by mouth at bedtime.  . tadalafil (CIALIS) 20 MG tablet Take 0.5-1 tablets (10-20 mg total) by mouth every other day as needed for erectile dysfunction.  Marland Kitchen telmisartan-hydrochlorothiazide (MICARDIS HCT) 80-25 MG tablet Take 1 tablet by mouth daily.  . timolol (TIMOPTIC) 0.5 % ophthalmic solution INSTILL 1 DROP INTO LEFT EYE TWICE DAILY  . TRESIBA FLEXTOUCH 200 UNIT/ML SOPN Inject 76 Units into the skin daily.  . [DISCONTINUED] doxycycline (VIBRA-TABS) 100 MG tablet Take 1 tablet (100  mg total) by mouth 2 (two) times daily. (Patient not taking: Reported on 01/29/2020)  . [DISCONTINUED] esomeprazole (NEXIUM) 20 MG capsule Take 20 mg by mouth daily.    No facility-administered encounter medications on file as of 02/29/2020.    Allergies (verified) Demerol [meperidine], Dilaudid [hydromorphone hcl], Hydromorphone, Meperidine hcl, Actos [pioglitazone], Dapagliflozin, and Penicillins   History: Past Medical History:  Diagnosis Date  . Allergy   . Chronic kidney disease   .  Diabetes mellitus without complication ([place])   . Diabetic neuropathy ([place])   . Diverticula, colon 1980's  . Diverticulosis   . Enlarged prostate   . GERD (gastroesophageal reflux disease)   . Glaucoma   . Hemorrhoids   . High cholesterol   . History of hiatal hernia   . Hyperlipidemia   . Hypertension   . Myocardial infarction ([employer])   . Obesity   . Onychomycosis   . Seizures ([place])   . Small bowel obstruction ([place]) 1990's  . Urination disorder    Past Surgical History:  Procedure Laterality Date  . APPENDECTOMY    . BLADDER DIVERTICULECTOMY  2013  . COLON SURGERY Left 1980's  . COLONOSCOPY W/ POLYPECTOMY  1990's   Dr Sharlet Salina  . COLONOSCOPY WITH PROPOFOL N/A 12/21/2014   Procedure: COLONOSCOPY WITH PROPOFOL;  Surgeon: Lollie Sails, MD;  Location: [facility] ENDOSCOPY;  Service: Endoscopy;  Laterality: N/A;  . COLONOSCOPY WITH PROPOFOL N/A 08/16/2017   Procedure: COLONOSCOPY WITH PROPOFOL;  Surgeon: Lollie Sails, MD;  Location: [facility] ENDOSCOPY;  Service: Endoscopy;  Laterality: N/A;  . ESOPHAGOGASTRODUODENOSCOPY (EGD) WITH PROPOFOL N/A 02/15/2015   Procedure: ESOPHAGOGASTRODUODENOSCOPY (EGD) WITH PROPOFOL;  Surgeon: Lollie Sails, MD;  Location: [facility] ENDOSCOPY;  Service: Endoscopy;  Laterality: N/A;  . ESOPHAGOGASTRODUODENOSCOPY (EGD) WITH PROPOFOL N/A 06/10/2017   Procedure: ESOPHAGOGASTRODUODENOSCOPY (EGD) WITH PROPOFOL;  Surgeon: Lollie Sails, MD;  Location: [facility] ENDOSCOPY;  Service: Endoscopy;  Laterality: N/A;  . ESOPHAGOGASTRODUODENOSCOPY (EGD) WITH PROPOFOL N/A 08/16/2017   Procedure: ESOPHAGOGASTRODUODENOSCOPY (EGD) WITH PROPOFOL;  Surgeon: Lollie Sails, MD;  Location: [facility] ENDOSCOPY;  Service: Endoscopy;  Laterality: N/A;  . EYE SURGERY    . GLAUCOMA SURGERY Left [place]     [place], Dr Sharlet Salina  . INGUINAL HERNIA REPAIR Bilateral 1960's  . LARYNX SURGERY  1990   3 times  . RETINAL  DETACHMENT SURGERY Right   . TONSILLECTOMY    . turp  2013  . URETER SURGERY  2013   Dr Jacqlyn Larsen   Family History  Problem Relation Age of Onset  . Diabetes Mother   . Cataracts Mother   . Metabolic syndrome Mother   . Blindness Mother        r/t diabetes  . Cataracts Father   . Glaucoma Father        Retina-Detachment  . Atrial fibrillation Father   . Diverticulitis Sister   . Glaucoma Brother   . Diabetes Brother   . Cancer Sister        Gallbladder  . Cancer Daughter    Social History   Socioeconomic History  . Marital status: Married    Spouse name: Pamala Hurry   . Number of children: 3  . Years of education: Not on file  . Highest education level: Professional school degree (e.g., MD, DDS, DVM, JD)  Occupational History  . Occupation: Industrial/product designer     Comment: still working - Engineer, maintenance as  a Optometrist   Tobacco Use  . Smoking status: Never Smoker  . Smokeless tobacco: Never Used  . Tobacco comment: smoking cessation materials not required  Vaping Use  . Vaping Use: Never used  Substance and Sexual Activity  . Alcohol use: No  . Drug use: No  . Sexual activity: Yes  Other Topics Concern  . Not on file  Social History Narrative   Married , still works as a Engineer, drilling Environmental consultant)   Three children ( one died in his 59's with colon cancer) , one lives in [place] and the other one in [place]   His siblings are in [place] and [place] Strain: [place]   . Difficulty of Paying Living Expenses: Not hard at all  Food Insecurity: No Food Insecurity  . Worried About Charity fundraiser in the Last Year: Never true  . Ran Out of Food in the Last Year: Never true  Transportation Needs: No Transportation Needs  . Lack of Transportation (Medical): No  . Lack of Transportation (Non-Medical): No  Physical Activity: Inactive  . Days of Exercise per Week: 0 days  . Minutes of Exercise per Session: 0 min  Stress: No Stress Concern  Present  . Feeling of Stress : Not at all  Social Connections: Socially Integrated  . Frequency of Communication with Friends and Family: More than three times a week  . Frequency of Social Gatherings with Friends and Family: Once a week  . Attends Religious Services: More than 4 times per year  . Active Member of Clubs or Organizations: Yes  . Attends Archivist Meetings: Never  . Marital Status: Married    Tobacco Counseling Counseling given: Not Answered Comment: smoking cessation materials not required   Clinical Intake:  Pre-visit preparation completed: Yes  Pain : No/denies pain     BMI - recorded: 30.33 Nutritional Status: BMI > 30  Obese Nutritional Risks: None Diabetes: Yes CBG done?: No Did pt. bring in CBG monitor from home?: No  How often do you need to have someone help you when you read instructions, pamphlets, or other written materials from your doctor or pharmacy?: 1 - Never  Nutrition Risk Assessment:  Has the patient had any N/V/D within the last 2 months?  No  Does the patient have any non-healing wounds?  No  Has the patient had any unintentional weight loss or weight gain?  No   Diabetes:  Is the patient diabetic?  Yes  If diabetic, was a CBG obtained today?  No  Did the patient bring in their glucometer from home?  No  How often do you monitor your CBG's? daily.   Financial Strains and Diabetes Management:  Are you having any financial strains with the device, your supplies or your medication? No .  Does the patient want to be seen by Chronic Care Management for management of their diabetes?  No  Would the patient like to be referred to a Nutritionist or for Diabetic Management?  No   Diabetic Exams:  Diabetic Eye Exam: Completed 10/23/19.   Diabetic Foot Exam: Completed 08/28/19.  Interpreter Needed?: No  Information entered by :: Clemetine Marker LPN   Activities of Daily Living In your present state of health, do you have any  difficulty performing the following activities: 02/29/2020 01/10/2020  Hearing? N N  Comment declines hearing aids -  Vision? N N  Difficulty concentrating or making decisions? N  N  Comment - -  Walking or climbing stairs? N N  Dressing or bathing? N N  Doing errands, shopping? N N  Preparing Food and eating ? N -  Using the Toilet? N -  In the past six months, have you accidently leaked urine? N -  Do you have problems with loss of bowel control? N -  Managing your Medications? N -  Managing your Finances? N -  Housekeeping or managing your Housekeeping? N -  Some recent data might be hidden    Patient Care Team: Steele Sizer, MD as PCP - General (Family Medicine) Requested, Self Birder Robson, MD as Consulting Physician (Ophthalmology) Earnestine Leys, MD as Consulting Physician (Orthopedic Surgery) Troxler, Adele Schilder as Consulting Physician (Podiatry) Lollie Sails, MD (Inactive) as Consulting Physician (Gastroenterology) Isaias Cowman, MD as Consulting Physician (Cardiology) Lonia Farber, MD as Consulting Physician (Internal Medicine)  Indicate any recent Medical Services you may have received from other than [facility] providers in the past year (date may be approximate).     Assessment:   This is a routine wellness examination for L[place].  Hearing/Vision screen  Hearing Screening   125Hz  250Hz  500Hz  1000Hz  2000Hz  3000Hz  4000Hz  6000Hz  8000Hz   Right ear:           Left ear:           Comments: Pt denies hearing difficulty   Vision Screening Comments: Annual vision screenings done by Dr. Michelene Heady [facility]  Dietary issues and exercise activities discussed: Current Exercise Habits: The patient does not participate in regular exercise at present, Exercise limited by: orthopedic condition(s)  Goals    . Increase water intake     Recommend increasing water intake to 6 glasses of water a day.     . Prevent falls     Recommend to remove  any items from the home that may cause slips or trips.      Depression Screen PHQ 2/9 Scores 02/29/2020 01/10/2020 10/30/2019 03/03/2019 02/28/2019 11/16/2018 05/17/2018  PHQ - 2 Score 0 0 0 0 0 0 0  PHQ- 9 Score - 0 0 0 - 0 0    Fall Risk Fall Risk  02/29/2020 01/10/2020 10/30/2019 05/01/2019 03/03/2019  Falls in the past year? 0 0 0 - 0  Number falls in past yr: 0 0 0 0 0  Injury with Fall? 0 0 0 0 0  Risk for fall due to : No Fall Risks - - - -  Risk for fall due to: Comment - - - - -  Follow up Falls prevention discussed - - - -    Any stairs in or around the home? Yes  If so, are there any without handrails? No  Home free of loose throw rugs in walkways, pet beds, electrical cords, etc? Yes  Adequate lighting in your home to reduce risk of falls? Yes   ASSISTIVE DEVICES UTILIZED TO PREVENT FALLS:  Life alert? No  Use of a cane, walker or w/c? No  Grab bars in the bathroom? Yes  Shower chair or bench in shower? No  Elevated toilet seat or a handicapped toilet? No   TIMED UP AND GO:  Was the test performed? Yes .  Length of time to ambulate 10 feet: 7 sec.   Gait slow and steady without use of assistive device  Cognitive Function: 6CIT deferred for 2021 AWV; pt states no memory issues     6CIT Screen 02/28/2019 02/25/2018  What Year? 0  points 0 points  What month? 0 points 0 points  What time? 0 points 0 points  Count back from 20 0 points 0 points  Months in reverse 0 points 0 points  Repeat phrase 0 points 4 points  Total Score 0 4    Immunizations Immunization History  Administered Date(s) Administered  . Fluad Quad(high Dose 65+) 02/28/2019  . Influenza, High Dose Seasonal PF 03/12/2016, 02/22/2017, 02/25/2018  . Influenza,inj,Quad PF,6+ Mos 03/28/2015  . PFIZER SARS-COV-2 Vaccination 06/21/2019, 07/12/2019  . Pneumococcal Conjugate-13 08/07/2013, 09/04/2013  . Pneumococcal Polysaccharide-23 04/04/2012  . Tdap 04/04/2012  . Zoster 09/17/2010  . Zoster  Recombinat (Shingrix) 12/14/2017, 02/21/2018, 10/26/2018    TDAP status: Up to date   Flu Vaccine status: Up to date   Pneumococcal vaccine status: Up to date   Covid-19 vaccine status: Completed vaccines  Qualifies for Shingles Vaccine? Yes   Zostavax completed Yes   Shingrix Completed?: Yes  Screening Tests Health Maintenance  Topic Date Due  . INFLUENZA VACCINE  01/14/2020  . HEMOGLOBIN A1C  05/30/2020  . FOOT EXAM  08/27/2020  . OPHTHALMOLOGY EXAM  10/22/2020  . TETANUS/TDAP  04/04/2022  . COVID-19 Vaccine  Completed  . PNA vac Low Risk Adult  Completed    Health Maintenance  Health Maintenance Due  Topic Date Due  . INFLUENZA VACCINE  01/14/2020    Colorectal Cancer Screening completed 08/16/17. Repeat every 3 years.   Lung Cancer Screening: (Low Dose CT Chest recommended if Age 66-80 years, 30 pack-year currently smoking OR have quit w/in 15years.) does not qualify.   Additional Screening:  Hepatitis C Screening: no longer required  Vision Screening: Recommended annual ophthalmology exams for early detection of glaucoma and other disorders of the eye. Is the patient up to date with their annual eye exam?  Yes  Who is the provider or what is the name of the office in which the patient attends annual eye exams? [place] Screening: Recommended annual dental exams for proper oral hygiene  Community Resource Referral / Chronic Care Management: CRR required this visit?  No   CCM required this visit?  No      Plan:     I have personally reviewed and noted the following in the patient's chart:   . Medical and social history . Use of alcohol, tobacco or illicit drugs  . Current medications and supplements . Functional ability and status . Nutritional status . Physical activity . Advanced directives . List of other physicians . Hospitalizations, surgeries, and ER visits in previous 12 months . Vitals . Screenings to include cognitive,  depression, and falls . Referrals and appointments  In addition, I have reviewed and discussed with patient certain preventive protocols, quality metrics, and best practice recommendations. A written personalized care plan for preventive services as well as general preventive health recommendations were provided to patient.     Clemetine Marker, LPN   08/28/1759   Nurse Notes: none

## 2020-03-12 DIAGNOSIS — Z23 Encounter for immunization: Secondary | ICD-10-CM | POA: Diagnosis not present

## 2020-03-21 ENCOUNTER — Other Ambulatory Visit: Payer: Self-pay

## 2020-03-21 ENCOUNTER — Ambulatory Visit (INDEPENDENT_AMBULATORY_CARE_PROVIDER_SITE_OTHER): Payer: Medicare Other | Admitting: Dermatology

## 2020-03-21 DIAGNOSIS — L905 Scar conditions and fibrosis of skin: Secondary | ICD-10-CM | POA: Diagnosis not present

## 2020-03-21 DIAGNOSIS — L578 Other skin changes due to chronic exposure to nonionizing radiation: Secondary | ICD-10-CM

## 2020-03-21 DIAGNOSIS — L219 Seborrheic dermatitis, unspecified: Secondary | ICD-10-CM

## 2020-03-21 DIAGNOSIS — L570 Actinic keratosis: Secondary | ICD-10-CM

## 2020-03-21 MED ORDER — HYDROCORTISONE 2.5 % EX CREA: TOPICAL_CREAM | CUTANEOUS | 6 refills | Status: DC

## 2020-03-21 MED ORDER — KETOCONAZOLE 2 % EX CREA: TOPICAL_CREAM | CUTANEOUS | 11 refills | Status: DC

## 2020-03-21 MED ORDER — MOMETASONE FUROATE 0.1 % EX SOLN: CUTANEOUS | 6 refills | Status: DC

## 2020-03-21 MED ORDER — KETOCONAZOLE 2 % EX SHAM: 1.0000 "application " | MEDICATED_SHAMPOO | CUTANEOUS | 11 refills | Status: DC

## 2020-03-21 NOTE — Progress Notes (Signed)
   Follow-Up Visit   Subjective  Jeremy [name] is a 84 y.o. male who presents for the following: Actinic Keratosis (L forehead above the eyebrow, R ear 46m f/u), ISKs (face, 10m f/u), and Seborrheic Dermatitis (face, HC 2.5% cr qohs, Ketoconazole 2% cr qohs, pt would like txt for scalp).  Patient accompanied by wife.  The following portions of the chart were reviewed this encounter and updated as appropriate:  Tobacco  Allergies  Meds  Problems  Med Hx  Surg Hx  Fam Hx     Review of Systems:  No other skin or systemic complaints except as noted in HPI or Assessment and Plan.  Objective  Well appearing patient in no apparent distress; mood and affect are within normal limits.  A focused examination was performed including face, scalp, ears. Relevant physical exam findings are noted in the Assessment and Plan.  Objective  face, scalp: Pink patches with greasy scale.   Objective  L forehead above the eyebrow x 1, R ear x 1 (2): Pink scaly macules   Objective  Right glabella: Scar 1.0 x 1.1cm  Images       Assessment & Plan    Actinic Damage - diffuse scaly erythematous macules with underlying dyspigmentation - Recommend daily broad spectrum sunscreen SPF 30+ to sun-exposed areas, reapply every 2 hours as needed.  - Call for new or changing lesions.  Seborrheic dermatitis face, scalp Improved on face, but persistent.  Advised is treatable but not curable he should continue regimen indefinitely.  Cont Ketoconazole 2% cr qohs to aa face Cont HC 2.5% cr qohs to aa face  Start Ketoconazole 2% shampoo 3x/wk, let sit several minutes and rinse out Start Mometasone solution aa scalp 3 nights a week  Samples of H&S given ketoconazole (NIZORAL) 2 % shampoo - face, scalp  mometasone (ELOCON) 0.1 % lotion - face, scalp  Reordered Medications hydrocortisone 2.5 % cream ketoconazole (NIZORAL) 2 % cream  AK (actinic keratosis) (2) L forehead above the eyebrow x 1,  R ear x 1  Destruction of lesion - L forehead above the eyebrow x 1, R ear x 1 Complexity: simple   Destruction method: cryotherapy   Informed consent: discussed and consent obtained   Timeout:  patient name, date of birth, surgical site, and procedure verified Lesion destroyed using liquid nitrogen: Yes   Region frozen until ice ball extended beyond lesion: Yes   Outcome: patient tolerated procedure well with no complications   Post-procedure details: wound care instructions given    Scar Right glabella 2ndary to trauma in early child hood If changes recommend biopsy. Observe  Return in about 6 months (around 09/19/2020) for AK, Seb derm.   I, Othelia Pulling, RMA, am acting as scribe for Sarina Ser, MD .. Documentation: I have reviewed the above documentation for accuracy and completeness, and I agree with the above.  Sarina Ser, MD

## 2020-03-22 ENCOUNTER — Encounter: Payer: Self-pay | Admitting: Dermatology

## 2020-03-23 ENCOUNTER — Other Ambulatory Visit: Payer: Self-pay | Admitting: Family Medicine

## 2020-03-23 DIAGNOSIS — I10 Essential (primary) hypertension: Secondary | ICD-10-CM

## 2020-04-29 DIAGNOSIS — H401122 Primary open-angle glaucoma, left eye, moderate stage: Secondary | ICD-10-CM | POA: Diagnosis not present

## 2020-05-01 ENCOUNTER — Ambulatory Visit (INDEPENDENT_AMBULATORY_CARE_PROVIDER_SITE_OTHER): Payer: Medicare Other | Admitting: Family Medicine

## 2020-05-01 ENCOUNTER — Other Ambulatory Visit: Payer: Self-pay

## 2020-05-01 ENCOUNTER — Encounter: Payer: Self-pay | Admitting: Family Medicine

## 2020-05-01 VITALS — BP 127/78 | HR 78 | Temp 98.0°F | Resp 14 | Ht 76.0 in | Wt 246.1 lb

## 2020-05-01 DIAGNOSIS — E782 Mixed hyperlipidemia: Secondary | ICD-10-CM

## 2020-05-01 DIAGNOSIS — E785 Hyperlipidemia, unspecified: Secondary | ICD-10-CM

## 2020-05-01 DIAGNOSIS — I700 Atherosclerosis of aorta: Secondary | ICD-10-CM | POA: Diagnosis not present

## 2020-05-01 DIAGNOSIS — I152 Hypertension secondary to endocrine disorders: Secondary | ICD-10-CM

## 2020-05-01 DIAGNOSIS — E1159 Type 2 diabetes mellitus with other circulatory complications: Secondary | ICD-10-CM | POA: Diagnosis not present

## 2020-05-01 DIAGNOSIS — R600 Localized edema: Secondary | ICD-10-CM

## 2020-05-01 DIAGNOSIS — N400 Enlarged prostate without lower urinary tract symptoms: Secondary | ICD-10-CM

## 2020-05-01 DIAGNOSIS — D696 Thrombocytopenia, unspecified: Secondary | ICD-10-CM

## 2020-05-01 DIAGNOSIS — I10 Essential (primary) hypertension: Secondary | ICD-10-CM | POA: Diagnosis not present

## 2020-05-01 DIAGNOSIS — R809 Proteinuria, unspecified: Secondary | ICD-10-CM

## 2020-05-01 DIAGNOSIS — E1140 Type 2 diabetes mellitus with diabetic neuropathy, unspecified: Secondary | ICD-10-CM | POA: Diagnosis not present

## 2020-05-01 DIAGNOSIS — I8500 Esophageal varices without bleeding: Secondary | ICD-10-CM

## 2020-05-01 DIAGNOSIS — E1129 Type 2 diabetes mellitus with other diabetic kidney complication: Secondary | ICD-10-CM

## 2020-05-01 DIAGNOSIS — E1169 Type 2 diabetes mellitus with other specified complication: Secondary | ICD-10-CM

## 2020-05-01 DIAGNOSIS — Z794 Long term (current) use of insulin: Secondary | ICD-10-CM

## 2020-05-01 DIAGNOSIS — N529 Male erectile dysfunction, unspecified: Secondary | ICD-10-CM

## 2020-05-01 DIAGNOSIS — L97929 Non-pressure chronic ulcer of unspecified part of left lower leg with unspecified severity: Secondary | ICD-10-CM

## 2020-05-01 LAB — CBC WITH DIFFERENTIAL/PLATELET
Absolute Monocytes: 631 cells/uL (ref 200–950)
Basophils Absolute: 23 cells/uL (ref 0–200)
Basophils Relative: 0.3 %
Eosinophils Absolute: 293 cells/uL (ref 15–500)
Eosinophils Relative: 3.8 %
HCT: 47.7 % (ref 38.5–50.0)
Hemoglobin: 16.7 g/dL (ref 13.2–17.1)
Lymphs Abs: 1301 cells/uL (ref 850–3900)
MCH: 34.8 pg — ABNORMAL HIGH (ref 27.0–33.0)
MCHC: 35 g/dL (ref 32.0–36.0)
MCV: 99.4 fL (ref 80.0–100.0)
MPV: 10.9 fL (ref 7.5–12.5)
Monocytes Relative: 8.2 %
Neutro Abs: 5452 cells/uL (ref 1500–7800)
Neutrophils Relative %: 70.8 %
Platelets: 159 10*3/uL (ref 140–400)
RBC: 4.8 10*6/uL (ref 4.20–5.80)
RDW: 12.4 % (ref 11.0–15.0)
Total Lymphocyte: 16.9 %
WBC: 7.7 10*3/uL (ref 3.8–10.8)

## 2020-05-01 LAB — PSA: PSA: 0.05 ng/mL (ref <=4.0)

## 2020-05-01 MED ORDER — FINASTERIDE 5 MG PO TABS: 5.0000 mg | ORAL_TABLET | Freq: Every day | ORAL | 1 refills | Status: DC

## 2020-05-01 MED ORDER — TELMISARTAN-HCTZ 80-25 MG PO TABS: 1.0000 | ORAL_TABLET | Freq: Every day | ORAL | 1 refills | Status: DC

## 2020-05-01 MED ORDER — OMEPRAZOLE 20 MG PO CPDR
20.0000 mg | DELAYED_RELEASE_CAPSULE | Freq: Every day | ORAL | 1 refills | Status: DC
Start: 2020-05-01 — End: 2020-12-02

## 2020-05-01 MED ORDER — TADALAFIL 20 MG PO TABS: 10.0000 mg | ORAL_TABLET | ORAL | 0 refills | Status: DC | PRN

## 2020-05-01 MED ORDER — BISOPROLOL FUMARATE 10 MG PO TABS: 10.0000 mg | ORAL_TABLET | Freq: Every day | ORAL | 1 refills | Status: DC

## 2020-05-01 MED ORDER — ROSUVASTATIN CALCIUM 10 MG PO TABS: 10.0000 mg | ORAL_TABLET | Freq: Every day | ORAL | 1 refills | Status: DC

## 2020-05-01 MED ORDER — OMEGA-3-ACID ETHYL ESTERS 1 G PO CAPS: 2.0000 g | ORAL_CAPSULE | Freq: Two times a day (BID) | ORAL | 1 refills | Status: DC

## 2020-05-01 NOTE — Progress Notes (Signed)
Name: Jeremy [name]   MRN: [id number]    DOB: 1936-01-19   Date:05/01/2020       Progress Note  Subjective  Chief Complaint  Follow up  HPI   DMII: he was diagnosed in the 90's during a hospital stay for small bowel obstruction. He was on oral medications for a long time, started on insulin a couple of years ago. Currently seeing EndocrinologistDr. Honor Junes at [facility]. He denies polyphagia, polyuria or polydipsia. He is compliant, up to date with eye exam, foot exam and urine micro. No side effects of medications, he is on insulin and since last visit with  Dr. Manfred Shirts  June 2021  He is still taking Tresiba, he is around 70 units per day, and no recent hypoglycemic episodes. Last A1C was 6.5% , glucose in the 80's , discussed having glucose between 100-120 , he states he is afraid about diabetic retinopathy and likes to keep it lower, discussed risk of hypoglycemia . He had a history of diabetic ulcer, last visit with Dr. Elvina Mattes was 02/28/2020, he has a new ulcer on the back of his leg, he does not want me to look at it now   HTN: he denies chest pain, palpitation or dizziness, but he has noticed skipped beats when checking his peripheral pulse. He has good exercise tolerance. He  states family history of atrial fibrillation and will discuss getting a holter monitor with cardiologist in January   Atherosclerosis of aorta: normal distal pulses, found on CT scan done 07/2018, no side effects of Rosuvastatin , we will recheck labs today   GERD with esophagitis, he is now on Oemprazole and states no heart burn or indigestion. Controlled with medication   Hyperlipidemia and aorta atherosclerosis: no myalgia, reviewed last labs and labs back to normal   Esophageal varices: he was seeing EGD and was sent to GI and EGD showed some variceal veins, but mostly on proximal esophagus, he is on low dose PPIand had multiple studies but negative for cirrhosis so far. He is currently  switching from [facility] to Dr. Fleet Contras   Seborrhea: under the care of Dr. Nehemiah Massed  Lower extremity edema he uses compression stocking hoses and it has improved   Patient Active Problem List   Diagnosis Date Noted  . Skin ulcer of left lower leg ([place]) 05/01/2020  . Bicipital tenosynovitis 11/16/2018  . Impingement syndrome of shoulder region 11/16/2018  . Atherosclerosis of aorta ([place]) 07/21/2018  . History of diverticulitis of colon 11/15/2017  . Hyperlipidemia due to type 2 diabetes mellitus ([place]) 03/14/2017  . Bilateral lower extremity edema 03/27/2016  . Strain of hamstring muscle 02/21/2016  . Vitamin D deficiency 12/05/2015  . Incomplete emptying of bladder 10/11/2015  . Hx of transient ischemic attack (TIA) 09/17/2015  . Transient vision disturbance of both eyes 08/28/2015  . Stroke-like symptoms 08/28/2015  . Diabetes mellitus type 2 without retinopathy ([place]) 05/31/2015  . Hyperlipidemia 05/30/2015  . Abnormal CT of the chest 05/15/2015  . Abnormal abdominal CT scan 05/15/2015  . Chronic kidney disease (CKD), stage I 01/08/2015  . Type II diabetes mellitus with renal manifestations, uncontrolled ([place]) 01/08/2015  . Type 2 diabetes, uncontrolled, with neuropathy ([place]) 01/08/2015  . Obesity 01/08/2015  . Proteinuria 01/08/2015  . Atrophy of vocal cord 02/07/2014  . Incisional hernia, without obstruction or gangrene 03/23/2013  . Enlarged prostate with lower urinary tract symptoms (LUTS) 11/03/2012  . Myopic degeneration, bilateral 08/12/2012  . Nonexudative age-related macular degeneration 07/15/2012  .  Anisometropia 04/08/2012  . Pseudoaphakia 04/08/2012  . History of retinal detachment 04/08/2012  . Presence of intraocular lens 04/08/2012  . Bladder diverticulum 02/12/2012  . Diverticulum of bladder 02/12/2012  . Dysphonia 02/08/2012  . Secondary open-angle glaucoma of left eye, severe stage 01/13/2012  . Osteopenia 03/18/2010  . Gastro-esophageal reflux  disease with esophagitis 02/07/2008  . Benign essential HTN 02/03/2007  . Hypertension associated with diabetes ([place]) 02/03/2007    Past Surgical History:  Procedure Laterality Date  . APPENDECTOMY    . BLADDER DIVERTICULECTOMY  2013  . COLON SURGERY Left 1980's  . COLONOSCOPY W/ POLYPECTOMY  1990's   Dr Sharlet Salina  . COLONOSCOPY WITH PROPOFOL N/A 12/21/2014   Procedure: COLONOSCOPY WITH PROPOFOL;  Surgeon: Lollie Sails, MD;  Location: [facility] ENDOSCOPY;  Service: Endoscopy;  Laterality: N/A;  . COLONOSCOPY WITH PROPOFOL N/A 08/16/2017   Procedure: COLONOSCOPY WITH PROPOFOL;  Surgeon: Lollie Sails, MD;  Location[facility] ENDOSCOPY;  Service: Endoscopy;  Laterality: N/A;  . ESOPHAGOGASTRODUODENOSCOPY (EGD) WITH PROPOFOL N/A 02/15/2015   Procedure: ESOPHAGOGASTRODUODENOSCOPY (EGD) WITH PROPOFOL;  Surgeon: Lollie Sails, MD;  Location: [facility] ENDOSCOPY;  Service: Endoscopy;  Laterality: N/A;  . ESOPHAGOGASTRODUODENOSCOPY (EGD) WITH PROPOFOL N/A 06/10/2017   Procedure: ESOPHAGOGASTRODUODENOSCOPY (EGD) WITH PROPOFOL;  Surgeon: Lollie Sails, MD;  Location: [facility] ENDOSCOPY;  Service: Endoscopy;  Laterality: N/A;  . ESOPHAGOGASTRODUODENOSCOPY (EGD) WITH PROPOFOL N/A 08/16/2017   Procedure: ESOPHAGOGASTRODUODENOSCOPY (EGD) WITH PROPOFOL;  Surgeon: Lollie Sails, MD;  Location: [facility] ENDOSCOPY;  Service: Endoscopy;  Laterality: N/A;  . EYE SURGERY    . GLAUCOMA SURGERY Left [place]     [place], Dr Sharlet Salina  . INGUINAL HERNIA REPAIR Bilateral 1960's  . LARYNX SURGERY  1990   3 times  . RETINAL DETACHMENT SURGERY Right   . TONSILLECTOMY    . turp  2013  . URETER SURGERY  2013   Dr Jacqlyn Larsen    Family History  Problem Relation Age of Onset  . Diabetes Mother   . Cataracts Mother   . Metabolic syndrome Mother   . Blindness Mother        r/t diabetes  . Cataracts Father   . Glaucoma Father         Retina-Detachment  . Atrial fibrillation Father   . Diverticulitis Sister   . Glaucoma Brother   . Diabetes Brother   . Cancer Sister        Gallbladder  . Cancer Daughter     Social History   Tobacco Use  . Smoking status: Never Smoker  . Smokeless tobacco: Never Used  . Tobacco comment: smoking cessation materials not required  Substance Use Topics  . Alcohol use: No     Current Outpatient Medications:  .  bisoprolol (ZEBETA) 10 MG tablet, Take 1 tablet by mouth once daily, Disp: 90 tablet, Rfl: 0 .  brimonidine (ALPHAGAN P) 0.1 % SOLN, Place 1 drop into the left eye 2 (two) times daily. , Disp: , Rfl:  .  desonide (DESOWEN) 0.05 % ointment, Apply to rash under eye QD-BID PRN, Disp: 15 g, Rfl: 1 .  dorzolamide (TRUSOPT) 2 % ophthalmic solution, Place 1 drop into the left eye 2 (two) times daily., Disp: , Rfl: 3 .  Exenatide ER (BYDUREON BCISE) 2 MG/0.85ML AUIJ, Inject into the skin., Disp: , Rfl:  .  finasteride (PROSCAR) 5 MG tablet, Take 1 tablet (5 mg total)  by mouth daily., Disp: 90 tablet, Rfl: 1 .  hydrocortisone 2.5 % cream, Apply to aa's face and ears every other night alternating with Ketoconazole cream, Disp: 28 g, Rfl: 6 .  INVOKANA 300 MG TABS tablet, TAKE ONE TABLET BY MOUTH ONCE DAILY, Disp: 90 tablet, Rfl: 0 .  ketoconazole (NIZORAL) 2 % cream, Apply to aa's face and ears every other night alternating with Hydrocortisone cream., Disp: 60 g, Rfl: 11 .  ketoconazole (NIZORAL) 2 % shampoo, Apply 1 application topically 3 (three) times a week. Shampoo scalp 3 days a week let sit 5 minutes and rinse off, Disp: 120 mL, Rfl: 11 .  metFORMIN (GLUCOPHAGE) 1000 MG tablet, Take 1 tablet (1,000 mg total) by mouth 2 (two) times daily., Disp: 180 tablet, Rfl: 1 .  mometasone (ELOCON) 0.1 % lotion, Apply topically 3 (three) times a week. Apply to aa scalp 3 nights a week, Disp: 60 mL, Rfl: 6 .  naproxen sodium (ALEVE) 220 MG tablet, Take 1 tablet (220 mg total) by mouth daily as  needed., Disp: 30 tablet, Rfl: 0 .  omega-3 acid ethyl esters (LOVAZA) 1 g capsule, Take 2 capsules (2 g total) by mouth 2 (two) times daily., Disp: 360 capsule, Rfl: 1 .  omeprazole (PRILOSEC) 20 MG capsule, Take 20 mg by mouth daily. OTC, Disp: , Rfl:  .  rosuvastatin (CRESTOR) 10 MG tablet, Take 1 tablet (10 mg total) by mouth at bedtime., Disp: 90 tablet, Rfl: 1 .  tadalafil (CIALIS) 20 MG tablet, Take 0.5-1 tablets (10-20 mg total) by mouth every other day as needed for erectile dysfunction., Disp: 5 tablet, Rfl: 5 .  telmisartan-hydrochlorothiazide (MICARDIS HCT) 80-25 MG tablet, Take 1 tablet by mouth daily., Disp: 90 tablet, Rfl: 1 .  timolol (TIMOPTIC) 0.5 % ophthalmic solution, INSTILL 1 DROP INTO LEFT EYE TWICE DAILY, Disp: , Rfl:  .  TRESIBA FLEXTOUCH 200 UNIT/ML SOPN, Inject 76 Units into the skin daily., Disp: , Rfl:   Allergies  Allergen Reactions  . Demerol [Meperidine] Other (See Comments) and Anaphylaxis    MAKES BLOOD PRESSURE BOTTOM OUT hypotension  . Dilaudid [Hydromorphone Hcl]   . Hydromorphone     Other reaction(s): Unknown MAKES BLOOD PRESSURE BOTTOM OUT  . Meperidine Hcl   . Actos [Pioglitazone] Other (See Comments) and Hives  . Dapagliflozin Rash  . Penicillins Rash    I personally reviewed active problem list, medication list, allergies, family history, social history, health maintenance, notes from last encounter with the patient/caregiver today.   ROS  Constitutional: Negative for fever or weight change.  Respiratory: Negative for cough and shortness of breath.   Cardiovascular: Negative for chest pain or palpitations.  Gastrointestinal: Negative for abdominal pain, no bowel changes.  Musculoskeletal: Negative for gait problem or joint swelling.  Skin: Negative for rash.  Neurological: Negative for dizziness or headache.  No other specific complaints in a complete review of systems (except as listed in HPI above).  Objective  Vitals:   05/01/20  1314  BP: 127/78  Pulse: 78  Resp: 14  Temp: 98 F (36.7 C)  SpO2: 96%  Weight: 246 lb 1.6 oz (111.6 kg)  Height: 6\' 4"  (1.93 m)    Body mass index is 29.96 kg/m.  Physical Exam  Constitutional: Patient appears well-developed and well-nourished. Obese  No distress.  HEENT: head atraumatic, normocephalic, strabismus, erythematous conjunctiva on left eye, also has a small nodule on the lower conjunctiva - seen by Dr. Linton Flemings   neck supple Cardiovascular:  Normal rate, regular rhythm and normal heart sounds.  No murmur heard. No BLE edema. Pulmonary/Chest: Effort normal and breath sounds normal. No respiratory distress. Abdominal: Soft.  There is no tenderness. Psychiatric: Patient has a normal mood and affect. behavior is normal. Judgment and thought content normal.  Recent Results (from the past 2160 hour(s))  COMPLETE METABOLIC PANEL WITH GFR     Status: None   Collection Time: 02/28/20 11:44 AM  Result Value Ref Range   Glucose, Bld 91 65 - 99 mg/dL    Comment: .            Fasting reference interval .    BUN 24 7 - 25 mg/dL   Creat 0.76 0.70 - 1.11 mg/dL    Comment: For patients >45 years of age, the reference limit for Creatinine is approximately 13% higher for people identified as African-American. .    GFR, Est Non African American 86 > OR = 60 mL/min/1.58m2   GFR, Est African American 99 > OR = 60 mL/min/1.92m2   BUN/Creatinine Ratio NOT APPLICABLE 6 - 22 (calc)   Sodium 141 135 - 146 mmol/L   Potassium 4.1 3.5 - 5.3 mmol/L   Chloride 103 98 - 110 mmol/L   CO2 30 20 - 32 mmol/L   Calcium 9.3 8.6 - 10.3 mg/dL   Total Protein 6.6 6.1 - 8.1 g/dL   Albumin 4.5 3.6 - 5.1 g/dL   Globulin 2.1 1.9 - 3.7 g/dL (calc)   AG Ratio 2.1 1.0 - 2.5 (calc)   Total Bilirubin 1.0 0.2 - 1.2 mg/dL   Alkaline phosphatase (APISO) 38 35 - 144 U/L   AST 15 10 - 35 U/L   ALT 16 9 - 46 U/L  Lipid panel     Status: None   Collection Time: 02/28/20 11:44 AM  Result Value Ref Range    Cholesterol 115 <200 mg/dL   HDL 42 > OR = 40 mg/dL   Triglycerides 141 <150 mg/dL   LDL Cholesterol (Calc) 51 mg/dL (calc)    Comment: Reference range: <100 . Desirable range <100 mg/dL for primary prevention;   <70 mg/dL for patients with CHD or diabetic patients  with > or = 2 CHD risk factors. Marland Kitchen LDL-C is now calculated using the Martin-Hopkins  calculation, which is a validated novel method providing  better accuracy than the Friedewald equation in the  estimation of LDL-C.  Cresenciano Genre et al. Annamaria Helling. 5732;202(54): 2061-2068  (http://education.[facility].com/faq/FAQ164)    Total CHOL/HDL Ratio 2.7 <5.0 (calc)   Non-HDL Cholesterol (Calc) 73 <130 mg/dL (calc)    Comment: For patients with diabetes plus 1 major ASCVD risk  factor, treating to a non-HDL-C goal of <100 mg/dL  (LDL-C of <70 mg/dL) is considered a therapeutic  option.       PHQ2/9: Depression screen [facility] 2/9 05/01/2020 02/29/2020 01/10/2020 10/30/2019 03/03/2019  Decreased Interest 0 0 0 0 0  Down, Depressed, Hopeless 0 0 0 0 0  PHQ - 2 Score 0 0 0 0 0  Altered sleeping - - 0 0 0  Tired, decreased energy - - 0 0 0  Change in appetite - - 0 0 0  Feeling bad or failure about yourself  - - 0 0 0  Trouble concentrating - - 0 0 0  Moving slowly or fidgety/restless - - 0 0 0  Suicidal thoughts - - 0 0 0  PHQ-9 Score - - 0 0 0  Difficult doing work/chores - - Not difficult at  all Not difficult at all Not difficult at all  Some recent data might be hidden    phq 9 is negative   Fall Risk: Fall Risk  05/01/2020 02/29/2020 01/10/2020 10/30/2019 05/01/2019  Falls in the past year? 0 0 0 0 -  Number falls in past yr: 0 0 0 0 0  Injury with Fall? 0 0 0 0 0  Risk for fall due to : - No Fall Risks - - -  Risk for fall due to: Comment - - - - -  Follow up - Falls prevention discussed - - -     Assessment & Plan  1. Hypertension associated with diabetes ([place])  - telmisartan-hydrochlorothiazide (MICARDIS HCT)  80-25 MG tablet; Take 1 tablet by mouth daily.  Dispense: 90 tablet; Refill: 1  2. Type 2 diabetes mellitus with microalbuminuria, with long-term current use of insulin (HCC)  - telmisartan-hydrochlorothiazide (MICARDIS HCT) 80-25 MG tablet; Take 1 tablet by mouth daily.  Dispense: 90 tablet; Refill: 1  3. Atherosclerosis of aorta ([place])  On statin therapy   4. Benign essential HTN  - bisoprolol (ZEBETA) 10 MG tablet; Take 1 tablet (10 mg total) by mouth daily.  Dispense: 90 tablet; Refill: 1 - telmisartan-hydrochlorothiazide (MICARDIS HCT) 80-25 MG tablet; Take 1 tablet by mouth daily.  Dispense: 90 tablet; Refill: 1  5. Dyslipidemia associated with type 2 diabetes mellitus (HCC)  - omega-3 acid ethyl esters (LOVAZA) 1 g capsule; Take 2 capsules (2 g total) by mouth 2 (two) times daily.  Dispense: 360 capsule; Refill: 1 - rosuvastatin (CRESTOR) 10 MG tablet; Take 1 tablet (10 mg total) by mouth at bedtime.  Dispense: 90 tablet; Refill: 1  6. Type 2 diabetes, controlled, with neuropathy ([place])  He is states symptoms are controlled  7. Esophageal varices without bleeding, unspecified esophageal varices type ([place])  He will have it repeated next year   8. Bilateral lower extremity edema  Doing well  9. Benign prostatic hyperplasia without lower urinary tract symptoms  - finasteride (PROSCAR) 5 MG tablet; Take 1 tablet (5 mg total) by mouth daily.  Dispense: 90 tablet; Refill: 1 - tadalafil (CIALIS) 20 MG tablet; Take 0.5-1 tablets (10-20 mg total) by mouth every other day as needed for erectile dysfunction.  Dispense: 30 tablet; Refill: 0 - PSA  10. Mixed hyperlipidemia  - omega-3 acid ethyl esters (LOVAZA) 1 g capsule; Take 2 capsules (2 g total) by mouth 2 (two) times daily.  Dispense: 360 capsule; Refill: 1 - rosuvastatin (CRESTOR) 10 MG tablet; Take 1 tablet (10 mg total) by mouth at bedtime.  Dispense: 90 tablet; Refill: 1  11. ED (erectile dysfunction) of organic  origin  - tadalafil (CIALIS) 20 MG tablet; Take 0.5-1 tablets (10-20 mg total) by mouth every other day as needed for erectile dysfunction.  Dispense: 30 tablet; Refill: 0  13. Thrombocytopenia (HCC)  - CBC with Differential/Platelet  14. Skin ulcer of left lower leg, unspecified ulcer stage ([place])

## 2020-05-13 ENCOUNTER — Other Ambulatory Visit: Payer: Self-pay | Admitting: Family Medicine

## 2020-05-13 ENCOUNTER — Telehealth: Payer: Self-pay

## 2020-05-13 DIAGNOSIS — N400 Enlarged prostate without lower urinary tract symptoms: Secondary | ICD-10-CM

## 2020-05-13 DIAGNOSIS — N529 Male erectile dysfunction, unspecified: Secondary | ICD-10-CM

## 2020-05-13 MED ORDER — TADALAFIL 20 MG PO TABS: 10.0000 mg | ORAL_TABLET | ORAL | 0 refills | Status: DC | PRN

## 2020-05-13 NOTE — Telephone Encounter (Signed)
Copied from [place] [id number]. Topic: General - Other >> May 13, 2020 12:13 PM Leward Quan A wrote: Reason for CRM: Patient called in to say that he was given a paper to take to the [facility] fortadalafil (CIALIS) 20 MG tablet seem to have misplaced this print out but will stop by the office this afternoon to pick up another so that he can get the medication filled at that pharmacy. Please advise   Ph# [phone]

## 2020-05-20 ENCOUNTER — Other Ambulatory Visit: Payer: Self-pay | Admitting: Family Medicine

## 2020-05-20 DIAGNOSIS — N400 Enlarged prostate without lower urinary tract symptoms: Secondary | ICD-10-CM

## 2020-05-23 DIAGNOSIS — H119 Unspecified disorder of conjunctiva: Secondary | ICD-10-CM | POA: Diagnosis not present

## 2020-05-29 DIAGNOSIS — B351 Tinea unguium: Secondary | ICD-10-CM | POA: Diagnosis not present

## 2020-05-29 DIAGNOSIS — E1142 Type 2 diabetes mellitus with diabetic polyneuropathy: Secondary | ICD-10-CM | POA: Diagnosis not present

## 2020-05-29 DIAGNOSIS — L851 Acquired keratosis [keratoderma] palmaris et plantaris: Secondary | ICD-10-CM | POA: Diagnosis not present

## 2020-06-04 DIAGNOSIS — Z794 Long term (current) use of insulin: Secondary | ICD-10-CM | POA: Diagnosis not present

## 2020-06-04 DIAGNOSIS — E1169 Type 2 diabetes mellitus with other specified complication: Secondary | ICD-10-CM | POA: Diagnosis not present

## 2020-06-04 DIAGNOSIS — I152 Hypertension secondary to endocrine disorders: Secondary | ICD-10-CM | POA: Diagnosis not present

## 2020-06-04 DIAGNOSIS — E785 Hyperlipidemia, unspecified: Secondary | ICD-10-CM | POA: Diagnosis not present

## 2020-06-04 DIAGNOSIS — E1142 Type 2 diabetes mellitus with diabetic polyneuropathy: Secondary | ICD-10-CM | POA: Diagnosis not present

## 2020-06-04 DIAGNOSIS — E1159 Type 2 diabetes mellitus with other circulatory complications: Secondary | ICD-10-CM | POA: Diagnosis not present

## 2020-06-24 ENCOUNTER — Other Ambulatory Visit: Payer: Self-pay | Admitting: Family Medicine

## 2020-06-24 DIAGNOSIS — I10 Essential (primary) hypertension: Secondary | ICD-10-CM

## 2020-06-27 DIAGNOSIS — H11222 Conjunctival granuloma, left eye: Secondary | ICD-10-CM | POA: Diagnosis not present

## 2020-06-27 DIAGNOSIS — H109 Unspecified conjunctivitis: Secondary | ICD-10-CM | POA: Diagnosis not present

## 2020-06-27 DIAGNOSIS — H119 Unspecified disorder of conjunctiva: Secondary | ICD-10-CM | POA: Diagnosis not present

## 2020-07-16 DIAGNOSIS — Z794 Long term (current) use of insulin: Secondary | ICD-10-CM | POA: Diagnosis not present

## 2020-07-16 DIAGNOSIS — R0602 Shortness of breath: Secondary | ICD-10-CM | POA: Diagnosis not present

## 2020-07-16 DIAGNOSIS — E1142 Type 2 diabetes mellitus with diabetic polyneuropathy: Secondary | ICD-10-CM | POA: Diagnosis not present

## 2020-07-16 DIAGNOSIS — I480 Paroxysmal atrial fibrillation: Secondary | ICD-10-CM | POA: Diagnosis not present

## 2020-07-16 DIAGNOSIS — E1169 Type 2 diabetes mellitus with other specified complication: Secondary | ICD-10-CM | POA: Diagnosis not present

## 2020-07-16 DIAGNOSIS — E785 Hyperlipidemia, unspecified: Secondary | ICD-10-CM | POA: Diagnosis not present

## 2020-08-28 DIAGNOSIS — E1142 Type 2 diabetes mellitus with diabetic polyneuropathy: Secondary | ICD-10-CM | POA: Diagnosis not present

## 2020-08-28 DIAGNOSIS — B351 Tinea unguium: Secondary | ICD-10-CM | POA: Diagnosis not present

## 2020-09-20 DIAGNOSIS — Z23 Encounter for immunization: Secondary | ICD-10-CM | POA: Diagnosis not present

## 2020-10-02 ENCOUNTER — Other Ambulatory Visit: Payer: Self-pay

## 2020-10-02 ENCOUNTER — Ambulatory Visit (INDEPENDENT_AMBULATORY_CARE_PROVIDER_SITE_OTHER): Payer: Medicare Other | Admitting: Dermatology

## 2020-10-02 DIAGNOSIS — L570 Actinic keratosis: Secondary | ICD-10-CM

## 2020-10-02 DIAGNOSIS — L219 Seborrheic dermatitis, unspecified: Secondary | ICD-10-CM

## 2020-10-02 DIAGNOSIS — L578 Other skin changes due to chronic exposure to nonionizing radiation: Secondary | ICD-10-CM

## 2020-10-02 NOTE — Progress Notes (Signed)
   Follow-Up Visit   Subjective  Jeremy [name] is a 85 y.o. male who presents for the following: Actinic Keratosis (Face, ears, 63m f/u) and Seborrheic Dermatitis (Face/scalp, Ketoconazole cr qohs, HC 2.5% cr qohs, ketoconazole 2% shampoo ~2x/wk, mometasone sol 2x/wk).  Patient accompanied by wife.  The following portions of the chart were reviewed this encounter and updated as appropriate:   Tobacco  Allergies  Meds  Problems  Med Hx  Surg Hx  Fam Hx     Review of Systems:  No other skin or systemic complaints except as noted in HPI or Assessment and Plan.  Objective  Well appearing patient in no apparent distress; mood and affect are within normal limits.  A focused examination was performed including face, scalp, ears. Relevant physical exam findings are noted in the Assessment and Plan.  Objective  face, scalp: Pink patches with greasy scale face, scalp  Objective  L forehead above brow x 1: Residual Pink scaly macule   Assessment & Plan  Seborrheic dermatitis face, scalp  Seborrheic Dermatitis  -  is a chronic persistent rash characterized by pinkness and scaling most commonly of the mid face but also can occur on the scalp (dandruff), ears; mid chest and mid back. It tends to be exacerbated by stress and cooler weather.  People who have neurologic disease may experience new onset or exacerbation of existing seborrheic dermatitis.  The condition is not curable but treatable and can be controlled.   Improved  Cont Ketoconazole 2% cr qohs aa face Cont HC 2.5% cr qohs aa face Cont Ketoconazole 2% shampoo 2x/wk Cont Mometasone lotion 2x/wk aa scalp  Other Related Medications ketoconazole (NIZORAL) 2 % shampoo mometasone (ELOCON) 0.1 % lotion hydrocortisone 2.5 % cream ketoconazole (NIZORAL) 2 % cream  AK (actinic keratosis) L forehead above brow x 1  Residual  Destruction of lesion - L forehead above brow x 1 Complexity: simple   Destruction method:  cryotherapy   Informed consent: discussed and consent obtained   Timeout:  patient name, date of birth, surgical site, and procedure verified Lesion destroyed using liquid nitrogen: Yes   Region frozen until ice ball extended beyond lesion: Yes   Outcome: patient tolerated procedure well with no complications   Post-procedure details: wound care instructions given    Actinic Damage - chronic, secondary to cumulative UV radiation exposure/sun exposure over time - diffuse scaly erythematous macules with underlying dyspigmentation - Recommend daily broad spectrum sunscreen SPF 30+ to sun-exposed areas, reapply every 2 hours as needed.  - Recommend staying in the shade or wearing long sleeves, sun glasses (UVA+UVB protection) and wide brim hats (4-inch brim around the entire circumference of the hat). - Call for new or changing lesions.  Return in about 1 year (around 10/02/2021) for UBSE, Hx of AKs.   I, Othelia Pulling, RMA, am acting as scribe for Sarina Ser, MD .  Documentation: I have reviewed the above documentation for accuracy and completeness, and I agree with the above.  Sarina Ser, MD

## 2020-10-02 NOTE — Patient Instructions (Signed)

## 2020-10-06 ENCOUNTER — Encounter: Payer: Self-pay | Admitting: Dermatology

## 2020-10-28 DIAGNOSIS — E119 Type 2 diabetes mellitus without complications: Secondary | ICD-10-CM | POA: Diagnosis not present

## 2020-10-28 DIAGNOSIS — H401122 Primary open-angle glaucoma, left eye, moderate stage: Secondary | ICD-10-CM | POA: Diagnosis not present

## 2020-10-28 LAB — HM DIABETES EYE EXAM

## 2020-10-28 NOTE — Progress Notes (Signed)
Name: Jeremy [name]   MRN: [id number]    DOB: Oct 03, 1935   Date:10/29/2020       Progress Note  Subjective  Chief Complaint  Follow up   HPI   DMII: he was diagnosed in the 90's during a hospital stay for small bowel obstruction. He was on oral medications for a long time, started on insulin a couple of years ago. Currently seeing EndocrinologistDr. Honor Junes at [facility]. He denies polyphagia, polyuria or polydipsia. He is compliant, up to date with eye exam, foot exam and urine micro. No side effects of medications, he is on insulin.  He is still taking [place], he is using 74 units per day, and no recent hypoglycemic episodes. Last A1C was 6.5% today down to 6.2 %, explained that since he is 85 yo it is safer to keep his fasting glucose above 100 . He had a history of diabetic ulcer, last visit with Dr. Cleda Mccreedy was March 2022   HTN: he denies chest pain, palpitation or dizziness He has good exercise tolerance, likes gardening   Atherosclerosis of aorta: normal distal pulses, found on CT scan done 07/2018, no side effects of Rosuvastatin , we will recheck labs next visit   GERD with esophagitis, he is now on Oemprazole and states no heart burn or indigestion.Well controlled   Hyperlipidemia and aorta atherosclerosis: no myalgia,we will recheck labs yearly   Esophageal varices: he was seeing EGD and was sent to GI and EGD showed some variceal veins, but mostly on proximal esophagus, he is on low dose PPIand had multiple studies but negative for cirrhosis so far. He would like a referral to Dr. Azucena Fallen: under the care of Dr. Nehemiah Massed, unchanged   Lower extremity edema he uses compression stocking hoses and it is controlling symptoms for him   Carpal tunnel syndrome: right wrist only, had surgery done by Dr. Sabra Heck about 10 years ago and has noticed tingling on middle fingers and sometimes right thumb, aggravated by driving or gardening, states symptoms are  intermittent. Discussed a brace but he would like to see Dr. Sabra Heck first   Patient Active Problem List   Diagnosis Date Noted  . Skin ulcer of left lower leg ([place]) 05/01/2020  . Bicipital tenosynovitis 11/16/2018  . Impingement syndrome of shoulder region 11/16/2018  . Atherosclerosis of aorta ([place]) 07/21/2018  . History of diverticulitis of colon 11/15/2017  . Hyperlipidemia due to type 2 diabetes mellitus ([place]) 03/14/2017  . Bilateral lower extremity edema 03/27/2016  . Strain of hamstring muscle 02/21/2016  . Vitamin D deficiency 12/05/2015  . Incomplete emptying of bladder 10/11/2015  . Hx of transient ischemic attack (TIA) 09/17/2015  . Transient vision disturbance of both eyes 08/28/2015  . Stroke-like symptoms 08/28/2015  . Diabetes mellitus type 2 without retinopathy ([place]) 05/31/2015  . Hyperlipidemia 05/30/2015  . Abnormal CT of the chest 05/15/2015  . Abnormal abdominal CT scan 05/15/2015  . Chronic kidney disease (CKD), stage I 01/08/2015  . Type II diabetes mellitus with renal manifestations, uncontrolled ([place]) 01/08/2015  . Type 2 diabetes, uncontrolled, with neuropathy ([place]) 01/08/2015  . Obesity 01/08/2015  . Proteinuria 01/08/2015  . Atrophy of vocal cord 02/07/2014  . Incisional hernia, without obstruction or gangrene 03/23/2013  . Enlarged prostate with lower urinary tract symptoms (LUTS) 11/03/2012  . Myopic degeneration, bilateral 08/12/2012  . Nonexudative age-related macular degeneration 07/15/2012  . Anisometropia 04/08/2012  . Pseudoaphakia 04/08/2012  . History of retinal detachment 04/08/2012  . Presence of  intraocular lens 04/08/2012  . Bladder diverticulum 02/12/2012  . Diverticulum of bladder 02/12/2012  . Dysphonia 02/08/2012  . Secondary open-angle glaucoma of left eye, severe stage 01/13/2012  . Osteopenia 03/18/2010  . Gastro-esophageal reflux disease with esophagitis 02/07/2008  . Benign essential HTN 02/03/2007  . Hypertension associated  with diabetes ([place]) 02/03/2007    Past Surgical History:  Procedure Laterality Date  . APPENDECTOMY    . BLADDER DIVERTICULECTOMY  2013  . COLON SURGERY Left 1980's  . COLONOSCOPY W/ POLYPECTOMY  1990's   Dr Sharlet Salina  . COLONOSCOPY WITH PROPOFOL N/A 12/21/2014   Procedure: COLONOSCOPY WITH PROPOFOL;  Surgeon: Lollie Sails, MD;  Location: [facility] ENDOSCOPY;  Service: Endoscopy;  Laterality: N/A;  . COLONOSCOPY WITH PROPOFOL N/A 08/16/2017   Procedure: COLONOSCOPY WITH PROPOFOL;  Surgeon: Lollie Sails, MD;  Location: [facility] ENDOSCOPY;  Service: Endoscopy;  Laterality: N/A;  . ESOPHAGOGASTRODUODENOSCOPY (EGD) WITH PROPOFOL N/A 02/15/2015   Procedure: ESOPHAGOGASTRODUODENOSCOPY (EGD) WITH PROPOFOL;  Surgeon: Lollie Sails, MD;  Location: [facility] ENDOSCOPY;  Service: Endoscopy;  Laterality: N/A;  . ESOPHAGOGASTRODUODENOSCOPY (EGD) WITH PROPOFOL N/A 06/10/2017   Procedure: ESOPHAGOGASTRODUODENOSCOPY (EGD) WITH PROPOFOL;  Surgeon: Lollie Sails, MD;  Location: [facility] ENDOSCOPY;  Service: Endoscopy;  Laterality: N/A;  . ESOPHAGOGASTRODUODENOSCOPY (EGD) WITH PROPOFOL N/A 08/16/2017   Procedure: ESOPHAGOGASTRODUODENOSCOPY (EGD) WITH PROPOFOL;  Surgeon: Lollie Sails, MD;  Location: [facility] ENDOSCOPY;  Service: Endoscopy;  Laterality: N/A;  . EYE SURGERY    . GLAUCOMA SURGERY Left [place]     [place], Dr Sharlet Salina  . INGUINAL HERNIA REPAIR Bilateral 1960's  . LARYNX SURGERY  1990   3 times  . RETINAL DETACHMENT SURGERY Right   . TONSILLECTOMY    . turp  2013  . URETER SURGERY  2013   Dr Jacqlyn Larsen    Family History  Problem Relation Age of Onset  . Diabetes Mother   . Cataracts Mother   . Metabolic syndrome Mother   . Blindness Mother        r/t diabetes  . Cataracts Father   . Glaucoma Father        Retina-Detachment  . Atrial fibrillation Father   . Diverticulitis Sister   . Glaucoma Brother   . Diabetes  Brother   . Cancer Sister        Gallbladder  . Cancer Daughter     Social History   Tobacco Use  . Smoking status: Never Smoker  . Smokeless tobacco: Never Used  . Tobacco comment: smoking cessation materials not required  Substance Use Topics  . Alcohol use: No     Current Outpatient Medications:  .  bisoprolol (ZEBETA) 10 MG tablet, Take 1 tablet by mouth once daily, Disp: 90 tablet, Rfl: 1 .  brimonidine (ALPHAGAN P) 0.1 % SOLN, Place 1 drop into the left eye 2 (two) times daily. , Disp: , Rfl:  .  desonide (DESOWEN) 0.05 % ointment, Apply to rash under eye QD-BID PRN, Disp: 15 g, Rfl: 1 .  dorzolamide (TRUSOPT) 2 % ophthalmic solution, Place 1 drop into the left eye 2 (two) times daily., Disp: , Rfl: 3 .  erythromycin ophthalmic ointment, SMARTSIG:In Eye(s), Disp: , Rfl:  .  Exenatide ER (BYDUREON BCISE) 2 MG/0.85ML AUIJ, Inject into the skin., Disp: , Rfl:  .  finasteride (PROSCAR) 5 MG tablet, Take 1 tablet by mouth once daily, Disp: 90 tablet, Rfl: 0 .  glimepiride (AMARYL) 2 MG tablet, Take by mouth., Disp: , Rfl:  .  hydrocortisone 2.5 % cream, Apply to aa's face and ears every other night alternating with Ketoconazole cream, Disp: 28 g, Rfl: 6 .  INVOKANA 300 MG TABS tablet, TAKE ONE TABLET BY MOUTH ONCE DAILY, Disp: 90 tablet, Rfl: 0 .  ketoconazole (NIZORAL) 2 % cream, Apply to aa's face and ears every other night alternating with Hydrocortisone cream., Disp: 60 g, Rfl: 11 .  ketoconazole (NIZORAL) 2 % shampoo, Apply 1 application topically 3 (three) times a week. Shampoo scalp 3 days a week let sit 5 minutes and rinse off, Disp: 120 mL, Rfl: 11 .  metFORMIN (GLUCOPHAGE) 1000 MG tablet, Take 1 tablet (1,000 mg total) by mouth 2 (two) times daily., Disp: 180 tablet, Rfl: 1 .  mometasone (ELOCON) 0.1 % lotion, Apply topically 3 (three) times a week. Apply to aa scalp 3 nights a week, Disp: 60 mL, Rfl: 6 .  naproxen sodium (ALEVE) 220 MG tablet, Take 1 tablet (220 mg total)  by mouth daily as needed., Disp: 30 tablet, Rfl: 0 .  omega-3 acid ethyl esters (LOVAZA) 1 g capsule, Take 2 capsules (2 g total) by mouth 2 (two) times daily., Disp: 360 capsule, Rfl: 1 .  omeprazole (PRILOSEC) 20 MG capsule, Take 1 capsule (20 mg total) by mouth daily. OTC, Disp: 90 capsule, Rfl: 1 .  rosuvastatin (CRESTOR) 10 MG tablet, Take 1 tablet (10 mg total) by mouth at bedtime., Disp: 90 tablet, Rfl: 1 .  tadalafil (CIALIS) 20 MG tablet, Take 0.5-1 tablets (10-20 mg total) by mouth every other day as needed for erectile dysfunction., Disp: 30 tablet, Rfl: 0 .  telmisartan-hydrochlorothiazide (MICARDIS HCT) 80-25 MG tablet, Take 1 tablet by mouth daily., Disp: 90 tablet, Rfl: 1 .  timolol (TIMOPTIC) 0.5 % ophthalmic solution, INSTILL 1 DROP INTO LEFT EYE TWICE DAILY, Disp: , Rfl:  .  TRESIBA FLEXTOUCH 200 UNIT/ML SOPN, Inject 76 Units into the skin daily., Disp: , Rfl:   Allergies  Allergen Reactions  . Demerol [Meperidine] Other (See Comments) and Anaphylaxis    MAKES BLOOD PRESSURE BOTTOM OUT hypotension  . Dilaudid [Hydromorphone Hcl]   . Hydromorphone     Other reaction(s): Unknown MAKES BLOOD PRESSURE BOTTOM OUT  . Meperidine Hcl   . Actos [Pioglitazone] Other (See Comments) and Hives  . Dapagliflozin Rash  . Penicillins Rash    I personally reviewed active problem list, medication list, allergies, family history, social history, health maintenance with the patient/caregiver today.   ROS  Constitutional: Negative for fever or weight change.  Respiratory: Negative for cough and shortness of breath.   Cardiovascular: Negative for chest pain or palpitations.  Gastrointestinal: Negative for abdominal pain, no bowel changes.  Musculoskeletal: Negative for gait problem or joint swelling.  Skin: Negative for rash.  Neurological: Negative for dizziness or headache.  No other specific complaints in a complete review of systems (except as listed in HPI  above).  Objective  Vitals:   10/29/20 1324  BP: 132/74  Pulse: 71  Resp: 16  Temp: 97.8 F (36.6 C)  TempSrc: Oral  SpO2: 94%  Weight: 248 lb (112.5 kg)  Height: 6\' 4"  (1.93 m)    Body mass index is 30.19 kg/m.  Physical Exam  Constitutional: Patient appears well-developed and well-nourished.  No distress.  HEENT: head atraumatic, normocephalic, pupils equal and reactive to light,neck supple Cardiovascular: Normal rate, regular rhythm and normal heart sounds.  No murmur heard. Trace  BLE edema. Pulmonary/Chest: Effort normal and breath sounds normal. No respiratory distress. Abdominal: Soft.  There is no tenderness. Muscular skeletal: no pain with tinnels or phalen's test  Psychiatric: Patient has a normal mood and affect. behavior is normal. Judgment and thought content normal.  PHQ2/9: Depression screen [facility] 2/9 10/29/2020 05/01/2020 02/29/2020 01/10/2020 10/30/2019  Decreased Interest 0 0 0 0 0  Down, Depressed, Hopeless 0 0 0 0 0  PHQ - 2 Score 0 0 0 0 0  Altered sleeping - - - 0 0  Tired, decreased energy - - - 0 0  Change in appetite - - - 0 0  Feeling bad or failure about yourself  - - - 0 0  Trouble concentrating - - - 0 0  Moving slowly or fidgety/restless - - - 0 0  Suicidal thoughts - - - 0 0  PHQ-9 Score - - - 0 0  Difficult doing work/chores - - - Not difficult at all Not difficult at all  Some recent data might be hidden    phq 9 is negative   Fall Risk: Fall Risk  10/29/2020 05/01/2020 02/29/2020 01/10/2020 10/30/2019  Falls in the past year? 0 0 0 0 0  Number falls in past yr: 0 0 0 0 0  Injury with Fall? 0 0 0 0 0  Risk for fall due to : - - No Fall Risks - -  Risk for fall due to: Comment - - - - -  Follow up - - Falls prevention discussed - -    Assessment & Plan  1. Type 2 diabetes mellitus with microalbuminuria, with long-term current use of insulin (HCC)  - POCT HgB A1C  2. Dyslipidemia associated with type 2 diabetes mellitus  ([place])   3. Atherosclerosis of aorta ([place])  On statin therapy   4. Hypertension associated with diabetes ([place])   5. Esophageal varices without bleeding, unspecified esophageal varices type ([place])  - Ambulatory referral to General Surgery  6. Thrombocytopenia ([place])   7. Bilateral lower extremity edema  Stable   8. Benign essential HTN  At goal   9. Mixed hyperlipidemia   10. Carpal tunnel syndrome of right wrist  - Ambulatory referral to Orthopedic Surgery

## 2020-10-29 ENCOUNTER — Ambulatory Visit (INDEPENDENT_AMBULATORY_CARE_PROVIDER_SITE_OTHER): Payer: Medicare Other | Admitting: Family Medicine

## 2020-10-29 ENCOUNTER — Other Ambulatory Visit: Payer: Self-pay

## 2020-10-29 ENCOUNTER — Encounter: Payer: Self-pay | Admitting: Family Medicine

## 2020-10-29 VITALS — BP 132/74 | HR 71 | Temp 97.8°F | Resp 16 | Ht 76.0 in | Wt 248.0 lb

## 2020-10-29 DIAGNOSIS — E785 Hyperlipidemia, unspecified: Secondary | ICD-10-CM

## 2020-10-29 DIAGNOSIS — D696 Thrombocytopenia, unspecified: Secondary | ICD-10-CM

## 2020-10-29 DIAGNOSIS — E1169 Type 2 diabetes mellitus with other specified complication: Secondary | ICD-10-CM

## 2020-10-29 DIAGNOSIS — I152 Hypertension secondary to endocrine disorders: Secondary | ICD-10-CM

## 2020-10-29 DIAGNOSIS — G5601 Carpal tunnel syndrome, right upper limb: Secondary | ICD-10-CM | POA: Diagnosis not present

## 2020-10-29 DIAGNOSIS — E1159 Type 2 diabetes mellitus with other circulatory complications: Secondary | ICD-10-CM

## 2020-10-29 DIAGNOSIS — I10 Essential (primary) hypertension: Secondary | ICD-10-CM | POA: Diagnosis not present

## 2020-10-29 DIAGNOSIS — I8500 Esophageal varices without bleeding: Secondary | ICD-10-CM | POA: Diagnosis not present

## 2020-10-29 DIAGNOSIS — E1129 Type 2 diabetes mellitus with other diabetic kidney complication: Secondary | ICD-10-CM

## 2020-10-29 DIAGNOSIS — Z794 Long term (current) use of insulin: Secondary | ICD-10-CM | POA: Diagnosis not present

## 2020-10-29 DIAGNOSIS — R600 Localized edema: Secondary | ICD-10-CM

## 2020-10-29 DIAGNOSIS — I700 Atherosclerosis of aorta: Secondary | ICD-10-CM | POA: Diagnosis not present

## 2020-10-29 DIAGNOSIS — R809 Proteinuria, unspecified: Secondary | ICD-10-CM | POA: Diagnosis not present

## 2020-10-29 DIAGNOSIS — E782 Mixed hyperlipidemia: Secondary | ICD-10-CM | POA: Diagnosis not present

## 2020-10-29 LAB — POCT GLYCOSYLATED HEMOGLOBIN (HGB A1C): Hemoglobin A1C: 6.2 % — AB (ref 4.0–5.6)

## 2020-10-31 ENCOUNTER — Encounter: Payer: Self-pay | Admitting: Family Medicine

## 2020-10-31 DIAGNOSIS — M25561 Pain in right knee: Secondary | ICD-10-CM | POA: Diagnosis not present

## 2020-10-31 DIAGNOSIS — G5601 Carpal tunnel syndrome, right upper limb: Secondary | ICD-10-CM | POA: Diagnosis not present

## 2020-11-07 DIAGNOSIS — I8500 Esophageal varices without bleeding: Secondary | ICD-10-CM | POA: Diagnosis not present

## 2020-11-07 DIAGNOSIS — Z86010 Personal history of colonic polyps: Secondary | ICD-10-CM | POA: Diagnosis not present

## 2020-11-08 ENCOUNTER — Other Ambulatory Visit: Payer: Self-pay | Admitting: General Surgery

## 2020-11-08 NOTE — Progress Notes (Signed)
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  HPI  The following portions of the patient's history were reviewed and updated as appropriate.  This an established patient is here today for: office visit. He is here to discuss having a colonoscopy and upper endoscopy, history esophageal varices referred by Dr Ancil Boozer He states he is not having any difficulty. Bowels move daily, no bleeding.  He was incidentally noted with varices during an ENT exam which included flexible laryngoscopy.  This was subsequently confirmed on upper endoscopy in 2019 with Dr. Anastasio Champion.  A follow-up had been recommended.  The patient has had colon polyps in the past and required a partial colectomy at [facility] in the distant past.  He is here with is wife, Corneluis Allston.  She shows progressive changes of adult onset Alzheimer's.  Review of Systems  Constitutional: Negative for chills and fever.  Respiratory: Negative for cough.        Chief Complaint  Patient presents with  . Follow-up     BP (!) 152/82   Pulse 71   Temp 36.2 C (97.1 F)   Ht 191.8 cm (6' 3.5")   Wt (!) 112.5 kg (248 lb)   SpO2 95%   BMI 30.59 kg/m       Past Medical History:  Diagnosis Date  . Chronic gastritis 04/30/2000  . CKD (chronic kidney disease)   . Diabetes mellitus type 2, uncomplicated (CMS-HCC)   . Diabetic neuropathy (CMS-HCC)   . Diverticula of colon   . Diverticulosis 12/21/2014  . Esophageal varices (CMS-HCC) 02/15/2015  . GERD (gastroesophageal reflux disease)   . Glaucoma   . Hemorrhoids 04/30/2000  . HH (hiatus hernia) 04/30/2000  . Hyperkeratosis   . Hyperkeratosis   . Hyperlipidemia   . Hypertension   . Onychomycosis   . Portal hypertensive gastropathy (CMS-HCC) 02/15/2015  . Seizure (CMS-HCC)   . Small bowel obstruction (CMS-HCC)    1990's  . Tubular adenoma of colon, unspecified 08/01/2005          Past Surgical History:   Procedure Laterality Date  .  POLYP REMOVAL    . APPENDECTOMY    . blader diverticulum resection  2014  . CATARACTS Bilateral   . COLONOSCOPY  11/10/2011,09/24/2008, 05/27/2002, 04/30/2000  . COLONOSCOPY  12/21/2014   Diverticulosis/PHx of tubular adenoma/FHx of Gallblader cancer-Repeat 30yr/MUS  . COLONOSCOPY  08/16/2017   Fair colon prep/No specimens collected/PHx CP/Repeat 330yrMUS  . EGD  04/30/2000  . EGD  02/15/2015   Esophageal varices/PHG/Gastritis/Negative biopsy/No Repeat/MUS  . EGD  06/10/2017   Intestinal metaplasia/Gastritis/Esophagitis/Repeat 8weeks/MUS  . EGD  08/16/2017   Gastritis/Repeat 2y6yrUS  . ELBOW LEFT BURSA    . GLAUCOMA EYE SURGERY     1990's  . HEMICOLECTOMY Left   . HERNIA REPAIR  1985  . INGUINAL HERNIA REPAIR Bilateral    1960's  . LARANGEAL GRANULOMA    . RETINAL DETACHMENT RIGHT EYE    . small bowel obstruction  1985  . TONSILLECTOMY    . UPPER GASTROINTESTINAL ENDOSCOPY    . URETEROPLASTY  2013       Social History         Socioeconomic History  . Marital status: Married  Tobacco Use  . Smoking status: Never Smoker  . Smokeless tobacco: Never Used  Vaping Use  . Vaping Use: Never used  Substance and Sexual Activity  . Alcohol use: No  . Drug use: No  .  Sexual activity: Defer            Allergies  Allergen Reactions  . Demerol [Meperidine] Other (See Comments) and Unknown    MAKES BLOOD PRESSURE BOTTOM OUT  . Dilaudid [Hydromorphone ([place])] Unknown    MAKES BLOOD PRESSURE BOTTOM OUT  . Dapagliflozin Rash  . Penicillins Rash and Unknown  . Pioglitazone Hives    Actos    Current Medications        Current Outpatient Medications  Medication Sig Dispense Refill  . ALPHAGAN P 0.1 % ophthalmic solution Place 1 drop into the left eye 2 (two) times daily.    0  . bisoprolol (ZEBETA) 10 MG tablet Take 10 mg by mouth once daily    . canagliflozin (INVOKANA) 300 mg  tablet Take 1 tablet (300 mg total) by mouth once daily 90 tablet 4  . desonide (DESOWEN) 0.05 % ointment APPLY TO RASH UNDER EYE DAILY TO TWICE DAILY AS NEEDED    . dorzolamide (TRUSOPT) 2 % ophthalmic solution Place 1 drop into the left eye 2 (two) times daily    . dorzolamide-timolol (COSOPT) 22.3-6.8 mg/mL ophthalmic solution Place 1 drop into the left eye 2 (two) times daily.      Marland Kitchen esomeprazole (NEXIUM) 20 MG DR capsule Take 20 mg by mouth once daily    . exenatide microspheres (BYDUREON BCISE) 2 mg/0.85 mL AtIn Inject 2 mg subcutaneously every 7 (seven) days 12 Syringe 4  . finasteride (PROSCAR) 5 mg tablet Take 5 mg by mouth once daily.    . insulin DEGLUDEC (TRESIBA FLEXTOUCH U-200) pen injector (concentration 200 units/mL) Inject 100 Units subcutaneously once daily (Patient taking differently: Inject 74 Units subcutaneously once daily) 18 mL 12  . ketoconazole (NIZORAL) 2 % cream APPLY TO THE AFFECTED AREAS OF FACE AND EARS EVERY OTHER NIGHT ALTERNATING WITH HYDROCORTISONE CREAM    . loratadine (CLARITIN) 10 mg capsule Take 10 mg by mouth once daily.    . meloxicam (MOBIC) 15 MG tablet Take 15 mg by mouth once daily as needed.    2  . metFORMIN (GLUCOPHAGE) 1000 MG tablet Take 1 tablet (1,000 mg total) by mouth 2 (two) times daily with meals 180 tablet 4  . mometasone (ELOCON) 0.1 % lotion APPLY TO THE AFFECTED AREAS OF SCALP THREE NIGHTS PER WEEK    . omega-3 acid ethyl esters (LOVAZA) 1 gram capsule Take 2 capsules by mouth 2 (two) times daily    . omeprazole (PRILOSEC) 20 MG DR capsule Take 20 mg by mouth once daily    . rosuvastatin (CRESTOR) 10 MG tablet Take 10 mg by mouth once daily.      . tadalafiL (CIALIS) 20 MG tablet     . telmisartan-hydrochlorothiazide (MICARDIS HCT) 80-25 mg tablet Take 1 tablet by mouth once daily    . timoloL maleate (TIMOPTIC) 0.5 % ophthalmic solution Place 1 drop into the left eye 2 (two) times daily     No  current facility-administered medications for this visit.           Family History  Problem Relation Age of Onset  . Diabetes Mother   . Blindness Mother   . Macular degeneration Mother   . High blood pressure (Hypertension) Mother   . Atrial fibrillation (Abnormal heart rhythm sometimes requiring treatment with blood thinners) Father   . Cataracts Father   . Cancer Sister        Gallbladder  . Diverticulitis Sister   . Diabetes Brother   .  Diabetes Maternal Grandmother   . Stroke Maternal Grandfather        COD  . Pneumonia Paternal Grandmother 33       COD  . No Known Problems Paternal Grandfather   . Colon cancer Daughter   . Colon polyps Maternal Aunt   . Colon cancer Maternal Aunt   . Colon cancer Maternal Aunt   . Heart disease Other   . Glaucoma Other   . High blood pressure (Hypertension) Other   . Thyroid disease Other        Objective:   Physical Exam Constitutional:      Appearance: Normal appearance.  Cardiovascular:     Rate and Rhythm: Normal rate and regular rhythm.     Pulses: Normal pulses.     Heart sounds: Normal heart sounds.  Pulmonary:     Effort: Pulmonary effort is normal.     Breath sounds: Normal breath sounds.  Musculoskeletal:     Cervical back: Neck supple.  Skin:    General: Skin is warm and dry.  Neurological:     Mental Status: He is alert and oriented to person, place, and time.  Psychiatric:        Mood and Affect: Mood normal.        Behavior: Behavior normal.    Labs and Radiology:   Colonoscopy report of August 16, 2017 described a colocolonic anastomosis at 25 cm was a slight pouch at the anastomosis.  Multiple diverticuli.  Recommendation for 3-5-year follow-up.  Upper endoscopy of the same date showed multiple esophageal varicosities in the upper third of the esophagus in a discontinuous pattern to the GE junction.  Resolution of previously noted esophagitis.  Small hiatal hernia.   Papilloma like lesion at 36 cm removed. DIAGNOSIS:  A. ESOPHAGUS AT 36 CM, PAPILLOMA; COLD BIOPSY:  - SQUAMOUS PAPILLOMA.  - NEGATIVE FOR DYSPLASIA AND MALIGNANCY.  Abdominal ultrasound with elastography of March 08, 2018: ULTRASOUND ABDOMEN: Changes of cirrhosis. No focal hepatic abnormality.  ULTRASOUND HEPATIC ELASTOGRAPHY:  Median hepatic shear wave velocity is calculated at 0.66 m/sec.  Corresponding Metavir fibrosis score is F0/F1.  Risk of fibrosis is Minimal.  Follow-up: None required  Abdominal ultrasound February 08, 2019:  IMPRESSION: ULTRASOUND ABDOMEN:  1. Mildly echogenic liver parenchyma, a nonspecific finding that can be seen with hepatic steatosis and/or fibrosis. No overt morphologic changes of hepatic cirrhosis. No liver mass is detected. 2. Benign right renal cysts. Otherwise normal abdominal sonogram, with limitations as detailed.  ULTRASOUND HEPATIC ELASTOGRAPHY:  Median hepatic shear wave velocity is calculated at 1.06 m/sec.  Corresponding Metavir fibrosis score is F0/F1.  Risk of fibrosis is Minimal.  Follow-up: None required.  June 04, 2020 laboratory:  Protein, Total 6.1 - 7.9 g/dL 6.7   Albumin 3.5 - 4.8 g/dL 4.5   Bilirubin, Total 0.3 - 1.2 mg/dL 1.1   Bilirubin, Conjugated 0.00 - 0.20 mg/dL 0.18   Alk Phos (alkaline Phosphatase) 34 - 104 U/L 37   AST  8 - 39 U/L 17   ALT  6 - 57 U/L 19    Hemoglobin A1C 4.2 - 5.6 % 6.5High    May 01, 2020:  WBC 3.8 - 10.8 Thousand/uL 7.7   RBC 4.20 - 5.80 Million/uL 4.80   Hemoglobin 13.2 - 17.1 g/dL 16.7   HCT 38.5 - 50.0 % 47.7   MCV 80.0 - 100.0 fL 99.4   MCH 27.0 - 33.0 pg 34.8High   MCHC 32.0 - 36.0 g/dL 35.0  RDW 11.0 - 15.0 % 12.4   Platelets 140 - 400 Thousand/uL 159   MPV 7.5 - 12.5 fL 10.9   Neutro Abs 1,500 - 7,800 cells/uL 5,452   Lymphs Abs 850 - 3,900 cells/uL 1,301   Absolute Monocytes 200 - 950 cells/uL 631   Eosinophils Absolute 15 -  500 cells/uL 293   Basophils Absolute 0 - 200 cells/uL 23   Neutrophils Relative % % 70.8   Total Lymphocyte % 16.9   Monocytes Relative % 8.2   Eosinophils Relative % 3.8   Basophils Relative % 0.3    February 28, 2020:  Glucose, Bld 65 - 99 mg/dL 91   Comment: .       Fasting reference interval  .   BUN 7 - 25 mg/dL 24   Creat 0.70 - 1.11 mg/dL 0.76   Comment: For patients >58 years of age, the reference limit  for Creatinine is approximately 13% higher for people  identified as African-American.  .   GFR, Est Non African American > OR = 60 mL/min/1.26m 86   GFR, Est African American > OR = 60 mL/min/1.770m99   BUN/Creatinine Ratio 6 - 22 (calc) NOT APPLICABLE   Sodium 13747 146 mmol/L 141   Potassium 3.5 - 5.3 mmol/L 4.1   Chloride 98 - 110 mmol/L 103   CO2 20 - 32 mmol/L 30   Calcium 8.6 - 10.3 mg/dL 9.3   Total Protein 6.1 - 8.1 g/dL 6.6   Albumin 3.6 - 5.1 g/dL 4.5   Globulin 1.9 - 3.7 g/dL (calc) 2.1   AG Ratio 1.0 - 2.5 (calc) 2.1   Total Bilirubin 0.2 - 1.2 mg/dL 1.0   Alkaline phosphatase (APISO) 35 - 144 U/L 38   AST 10 - 35 U/L 15   ALT 9 - 46 U/L 16          Assessment:     Esophageal varices with no clinical history to suggest increased risk for cirrhosis, most recent abdominal ultrasound without evidence of cirrhosis.  Past history colonic polyps.    Plan:     The patient remains in general good health.  He is the primary caregiver for his wife.  Pros and cons of intervention were reviewed and he is amenable to proceed.  He will hold metformin the day of prep and procedure and decrease his insulin by one half those days as well.     This note is partially prepared by MaKarie FetchRN, acting as a scribe in the presence of Dr. JeHervey ArdMD.  The documentation recorded by the scribe accurately reflects the service I personally performed and the decisions made by me.   JeRobert BellowMD FACS

## 2020-11-11 ENCOUNTER — Other Ambulatory Visit: Payer: Self-pay | Admitting: Family Medicine

## 2020-11-11 DIAGNOSIS — N400 Enlarged prostate without lower urinary tract symptoms: Secondary | ICD-10-CM

## 2020-11-24 ENCOUNTER — Other Ambulatory Visit: Payer: Self-pay | Admitting: Family Medicine

## 2020-11-24 DIAGNOSIS — E782 Mixed hyperlipidemia: Secondary | ICD-10-CM

## 2020-11-24 DIAGNOSIS — E1169 Type 2 diabetes mellitus with other specified complication: Secondary | ICD-10-CM

## 2020-11-24 NOTE — Telephone Encounter (Signed)
Requested Prescriptions  Pending Prescriptions Disp Refills  . omega-3 acid ethyl esters (LOVAZA) 1 g capsule [Pharmacy Med Name: Omega-3-acid Ethyl Esters 1 GM Oral Capsule] 360 capsule 0    Sig: Take 2 capsules by mouth twice daily     Endocrinology:  Nutritional Agents Passed - 11/24/2020  6:30 AM      Passed - Valid encounter within last 12 months    Recent Outpatient Visits          3 weeks ago Type 2 diabetes mellitus with microalbuminuria, with long-term current use of insulin [facility])   [facility] [place], Drue Stager, MD   6 months ago Hypertension associated with diabetes [facility])   [facility] Steele Sizer, MD   10 months ago Productive cough   [facility] Steele Sizer, MD   1 year ago Mixed hyperlipidemia   [facility], Drue Stager, MD   1 year ago Skin ulcer of left lower leg, unspecified ulcer stage [facility])   [facility] Steele Sizer, MD      Future Appointments            In 3 months  [facility], [place]   In 5 months Steele Sizer, MD [facility]

## 2020-11-28 DIAGNOSIS — L03031 Cellulitis of right toe: Secondary | ICD-10-CM | POA: Diagnosis not present

## 2020-11-28 DIAGNOSIS — B351 Tinea unguium: Secondary | ICD-10-CM | POA: Diagnosis not present

## 2020-11-28 DIAGNOSIS — E1142 Type 2 diabetes mellitus with diabetic polyneuropathy: Secondary | ICD-10-CM | POA: Diagnosis not present

## 2020-12-02 ENCOUNTER — Other Ambulatory Visit: Payer: Self-pay | Admitting: Family Medicine

## 2020-12-03 DIAGNOSIS — U071 COVID-19: Secondary | ICD-10-CM | POA: Diagnosis not present

## 2020-12-16 ENCOUNTER — Other Ambulatory Visit: Payer: Self-pay | Admitting: Family Medicine

## 2020-12-16 DIAGNOSIS — Z794 Long term (current) use of insulin: Secondary | ICD-10-CM

## 2020-12-16 DIAGNOSIS — I10 Essential (primary) hypertension: Secondary | ICD-10-CM

## 2020-12-16 DIAGNOSIS — E1159 Type 2 diabetes mellitus with other circulatory complications: Secondary | ICD-10-CM

## 2021-01-06 ENCOUNTER — Other Ambulatory Visit: Payer: Self-pay | Admitting: Family Medicine

## 2021-01-06 DIAGNOSIS — E1169 Type 2 diabetes mellitus with other specified complication: Secondary | ICD-10-CM

## 2021-01-06 DIAGNOSIS — E785 Hyperlipidemia, unspecified: Secondary | ICD-10-CM

## 2021-01-06 DIAGNOSIS — E782 Mixed hyperlipidemia: Secondary | ICD-10-CM

## 2021-01-20 DIAGNOSIS — R0602 Shortness of breath: Secondary | ICD-10-CM | POA: Diagnosis not present

## 2021-01-20 DIAGNOSIS — E785 Hyperlipidemia, unspecified: Secondary | ICD-10-CM | POA: Diagnosis not present

## 2021-01-20 DIAGNOSIS — I152 Hypertension secondary to endocrine disorders: Secondary | ICD-10-CM | POA: Diagnosis not present

## 2021-01-20 DIAGNOSIS — E1169 Type 2 diabetes mellitus with other specified complication: Secondary | ICD-10-CM | POA: Diagnosis not present

## 2021-01-20 DIAGNOSIS — E1142 Type 2 diabetes mellitus with diabetic polyneuropathy: Secondary | ICD-10-CM | POA: Diagnosis not present

## 2021-01-20 DIAGNOSIS — I700 Atherosclerosis of aorta: Secondary | ICD-10-CM | POA: Diagnosis not present

## 2021-01-20 DIAGNOSIS — Z794 Long term (current) use of insulin: Secondary | ICD-10-CM | POA: Diagnosis not present

## 2021-01-20 DIAGNOSIS — R079 Chest pain, unspecified: Secondary | ICD-10-CM | POA: Diagnosis not present

## 2021-01-20 DIAGNOSIS — E1159 Type 2 diabetes mellitus with other circulatory complications: Secondary | ICD-10-CM | POA: Diagnosis not present

## 2021-01-30 ENCOUNTER — Encounter: Payer: Self-pay | Admitting: General Surgery

## 2021-01-31 ENCOUNTER — Encounter
Admission: RE | Disposition: A | Discharge status: Home / Self Care | Payer: Self-pay | Source: Home / Self Care | Attending: General Surgery

## 2021-01-31 ENCOUNTER — Ambulatory Visit
Admission: RE | Admit: 2021-01-31 | Discharge: 2021-01-31 | Disposition: A | Discharge status: Home / Self Care | Payer: Medicare Other | Attending: General Surgery | Admitting: General Surgery

## 2021-01-31 ENCOUNTER — Ambulatory Visit: Payer: Medicare Other | Admitting: Anesthesiology

## 2021-01-31 ENCOUNTER — Other Ambulatory Visit: Payer: Self-pay

## 2021-01-31 ENCOUNTER — Encounter: Payer: Self-pay | Admitting: General Surgery

## 2021-01-31 DIAGNOSIS — Z86010 Personal history of colonic polyps: Secondary | ICD-10-CM | POA: Diagnosis not present

## 2021-01-31 DIAGNOSIS — K2970 Gastritis, unspecified, without bleeding: Secondary | ICD-10-CM | POA: Diagnosis not present

## 2021-01-31 DIAGNOSIS — Z79899 Other long term (current) drug therapy: Secondary | ICD-10-CM | POA: Diagnosis not present

## 2021-01-31 DIAGNOSIS — Z880 Allergy status to penicillin: Secondary | ICD-10-CM | POA: Diagnosis not present

## 2021-01-31 DIAGNOSIS — E785 Hyperlipidemia, unspecified: Secondary | ICD-10-CM | POA: Diagnosis not present

## 2021-01-31 DIAGNOSIS — Z9049 Acquired absence of other specified parts of digestive tract: Secondary | ICD-10-CM | POA: Diagnosis not present

## 2021-01-31 DIAGNOSIS — K5730 Diverticulosis of large intestine without perforation or abscess without bleeding: Secondary | ICD-10-CM | POA: Diagnosis not present

## 2021-01-31 DIAGNOSIS — Z888 Allergy status to other drugs, medicaments and biological substances status: Secondary | ICD-10-CM | POA: Insufficient documentation

## 2021-01-31 DIAGNOSIS — Z8719 Personal history of other diseases of the digestive system: Secondary | ICD-10-CM | POA: Insufficient documentation

## 2021-01-31 DIAGNOSIS — Z885 Allergy status to narcotic agent status: Secondary | ICD-10-CM | POA: Insufficient documentation

## 2021-01-31 DIAGNOSIS — Z1211 Encounter for screening for malignant neoplasm of colon: Secondary | ICD-10-CM | POA: Insufficient documentation

## 2021-01-31 DIAGNOSIS — K219 Gastro-esophageal reflux disease without esophagitis: Secondary | ICD-10-CM | POA: Insufficient documentation

## 2021-01-31 DIAGNOSIS — Z87892 Personal history of anaphylaxis: Secondary | ICD-10-CM | POA: Insufficient documentation

## 2021-01-31 DIAGNOSIS — Z794 Long term (current) use of insulin: Secondary | ICD-10-CM | POA: Insufficient documentation

## 2021-01-31 DIAGNOSIS — K2950 Unspecified chronic gastritis without bleeding: Secondary | ICD-10-CM | POA: Insufficient documentation

## 2021-01-31 DIAGNOSIS — K449 Diaphragmatic hernia without obstruction or gangrene: Secondary | ICD-10-CM | POA: Insufficient documentation

## 2021-01-31 DIAGNOSIS — I8501 Esophageal varices with bleeding: Secondary | ICD-10-CM | POA: Diagnosis not present

## 2021-01-31 HISTORY — PX: COLONOSCOPY WITH PROPOFOL: SHX5780

## 2021-01-31 HISTORY — PX: ESOPHAGOGASTRODUODENOSCOPY (EGD) WITH PROPOFOL: SHX5813

## 2021-01-31 SURGERY — ESOPHAGOGASTRODUODENOSCOPY (EGD) WITH PROPOFOL
Anesthesia: General

## 2021-01-31 MED ORDER — EPHEDRINE SULFATE 50 MG/ML IJ SOLN
INTRAMUSCULAR | Status: DC | PRN
  Administered 2021-01-31: 10 mg via INTRAVENOUS

## 2021-01-31 MED ORDER — LIDOCAINE HCL (CARDIAC) PF 100 MG/5ML IV SOSY
PREFILLED_SYRINGE | INTRAVENOUS | Status: DC | PRN
  Administered 2021-01-31: 50 mg via INTRAVENOUS

## 2021-01-31 MED ORDER — PROPOFOL 10 MG/ML IV BOLUS
INTRAVENOUS | Status: AC
  Filled 2021-01-31: qty 40

## 2021-01-31 MED ORDER — SODIUM CHLORIDE 0.9 % IV SOLN: INTRAVENOUS | Status: DC

## 2021-01-31 MED ORDER — PROPOFOL 500 MG/50ML IV EMUL
INTRAVENOUS | Status: AC
  Filled 2021-01-31: qty 50

## 2021-01-31 MED ORDER — PROPOFOL 10 MG/ML IV BOLUS
INTRAVENOUS | Status: DC | PRN
  Administered 2021-01-31: 80 mg via INTRAVENOUS

## 2021-01-31 MED ORDER — PROPOFOL 500 MG/50ML IV EMUL
INTRAVENOUS | Status: DC | PRN
  Administered 2021-01-31: 130 ug/kg/min via INTRAVENOUS

## 2021-01-31 MED ORDER — EPHEDRINE 5 MG/ML INJ
INTRAVENOUS | Status: AC
  Filled 2021-01-31: qty 5

## 2021-01-31 MED ORDER — LIDOCAINE HCL (PF) 2 % IJ SOLN
INTRAMUSCULAR | Status: AC
  Filled 2021-01-31: qty 5

## 2021-01-31 NOTE — Anesthesia Preprocedure Evaluation (Signed)
Anesthesia Evaluation  Patient identified by MRN, date of birth, ID band Patient awake    Reviewed: Allergy & Precautions, NPO status , Patient's Chart, lab work & pertinent test results  History of Anesthesia Complications Negative for: history of anesthetic complications  Airway Mallampati: II       Dental  (+) Chipped   Pulmonary neg shortness of breath,    breath sounds clear to auscultation       Cardiovascular Exercise Tolerance: Good hypertension, Pt. on medications and Pt. on home beta blockers + Past MI   Rhythm:Regular Rate:Normal     Neuro/Psych Seizures -,   Neuromuscular disease    GI/Hepatic Neg liver ROS, hiatal hernia, GERD  Medicated and Controlled,  Endo/Other  diabetes, Type 1, Insulin Dependent  Renal/GU Renal InsufficiencyRenal disease     Musculoskeletal negative musculoskeletal ROS (+)   Abdominal (+) + obese,   Peds negative pediatric ROS (+)  Hematology negative hematology ROS (+)   Anesthesia Other Findings Past Medical History: No date: Chronic kidney disease No date: Diabetes mellitus without complication (HCC) No date: Diabetic neuropathy ([place]) 1980's: Diverticula, colon No date: Diverticulosis No date: Enlarged prostate No date: GERD (gastroesophageal reflux disease) No date: Glaucoma No date: Hemorrhoids No date: High cholesterol No date: History of hiatal hernia No date: Hyperlipidemia No date: Hypertension No date: Myocardial infarction ([place]) No date: Obesity No date: Onychomycosis No date: Seizures ([place]) 1990's: Small bowel obstruction (HCC) No date: Urination disorder   Reproductive/Obstetrics                             Anesthesia Physical  Anesthesia Plan  ASA: 3  Anesthesia Plan: General   Post-op Pain Management:    Induction: Intravenous  PONV Risk Score and Plan: 2 and Propofol infusion and TIVA  Airway Management  Planned: Natural Airway and Nasal Cannula  Additional Equipment:   Intra-op Plan:   Post-operative Plan:   Informed Consent: I have reviewed the patients History and Physical, chart, labs and discussed the procedure including the risks, benefits and alternatives for the proposed anesthesia with the patient or authorized representative who has indicated his/her understanding and acceptance.     Dental Advisory Given  Plan Discussed with: Surgeon  Anesthesia Plan Comments: (Patient consented for risks of anesthesia including but not limited to:  - adverse reactions to medications - risk of airway placement if required - damage to eyes, teeth, lips or other oral mucosa - nerve damage due to positioning  - sore throat or hoarseness - Damage to heart, brain, nerves, lungs, other parts of body or loss of life  Patient voiced understanding.)        Anesthesia Quick Evaluation

## 2021-01-31 NOTE — Op Note (Addendum)
[facility] Gastroenterology Patient Name: Jeremy [name] Procedure Date: 01/31/2021 7:46 AM MRN: [id number] Account #: [id number] Date of Birth: [birth year] Admit Type: Outpatient Age: 85 Room: [facility] ENDO ROOM 1 Gender: Male Note Status: Supervisor Override Procedure:             Colonoscopy Indications:           Personal history of colonic polyps Providers:             Robert Bellow, MD Referring MD:          Bethena Roys. Sowles, MD (Referring MD) Medicines:             Propofol per Anesthesia Complications:         No immediate complications. Procedure:             Pre-Anesthesia Assessment:                        - Prior to the procedure, a History and Physical was                         performed, and patient medications, allergies and                         sensitivities were reviewed. The patient's tolerance                         of previous anesthesia was reviewed.                        - The risks and benefits of the procedure and the                         sedation options and risks were discussed with the                         patient. All questions were answered and informed                         consent was obtained.                        After obtaining informed consent, the colonoscope was                         passed under direct vision. Throughout the procedure,                         the patient's blood pressure, pulse, and oxygen                         saturations were monitored continuously. The                         Colonoscope was introduced through the anus and                         advanced to the the cecum, identified by appendiceal                         orifice  and ileocecal valve. The colonoscopy was                         performed without difficulty. The patient tolerated                         the procedure well. The quality of the bowel                         preparation was good. Findings:      Multiple  large-mouthed diverticula were found in the ascending colon and       cecum.      The retroflexed view of the distal rectum and anal verge was normal and       showed no anal or rectal abnormalities. Impression:            - Diverticulosis in the ascending colon and in the                         cecum.                        - The distal rectum and anal verge are normal on                         retroflexion view.                        - No specimens collected. Recommendation:        - Discharge patient to home (via wheelchair). Procedure Code(s):     --- Professional ---                        [phone], Colonoscopy, flexible; diagnostic, including                         collection of specimen(s) by brushing or washing, when                         performed (separate procedure) Diagnosis Code(s):     --- Professional ---                        K57.30, Diverticulosis of large intestine without                         perforation or abscess without bleeding                        Z12.11, Encounter for screening for malignant neoplasm                         of colon CPT copyright 2019 American Medical Association. All rights reserved. The codes documented in this report are preliminary and upon coder review may  be revised to meet current compliance requirements. Robert Bellow, MD 01/31/2021 8:15:10 AM This report has been signed electronically. Number of Addenda: 0 Note Initiated On: 01/31/2021 7:46 AM Scope Withdrawal Time: 0 hours 8 minutes 19 seconds  Total Procedure Duration: 0 hours 12 minutes 50 seconds  Estimated Blood Loss:  Estimated blood loss: none.  [facility]

## 2021-01-31 NOTE — H&P (Signed)
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 For screening exam. Tolerated perp well.   Medications Prior to Admission  Medication Sig Dispense Refill Last Dose   bisoprolol (ZEBETA) 10 MG tablet Take 1 tablet by mouth once daily 90 tablet 0 01/30/2021   brimonidine (ALPHAGAN P) 0.1 % SOLN Place 1 drop into the left eye 2 (two) times daily.    01/31/2021 at 0530   desonide (DESOWEN) 0.05 % ointment Apply to rash under eye QD-BID PRN 15 g 1 Past Week   dorzolamide (TRUSOPT) 2 % ophthalmic solution Place 1 drop into the left eye 2 (two) times daily.  3 01/31/2021 at 0530   finasteride (PROSCAR) 5 MG tablet Take 1 tablet by mouth once daily 90 tablet 1 01/30/2021   hydrocortisone 2.5 % cream Apply to aa's face and ears every other night alternating with Ketoconazole cream 28 g 6 Past Week   INVOKANA 300 MG TABS tablet TAKE ONE TABLET BY MOUTH ONCE DAILY 90 tablet 0 01/30/2021   ketoconazole (NIZORAL) 2 % cream Apply to aa's face and ears every other night alternating with Hydrocortisone cream. 60 g 11 Past Week   ketoconazole (NIZORAL) 2 % shampoo Apply 1 application topically 3 (three) times a week. Shampoo scalp 3 days a week let sit 5 minutes and rinse off 120 mL 11 Past Week   metFORMIN (GLUCOPHAGE) 1000 MG tablet Take 1 tablet (1,000 mg total) by mouth 2 (two) times daily. 180 tablet 1 01/30/2021   mometasone (ELOCON) 0.1 % lotion Apply topically 3 (three) times a week. Apply to aa scalp 3 nights a week 60 mL 6 Past Week   naproxen sodium (ALEVE) 220 MG tablet Take 1 tablet (220 mg total) by mouth daily as needed. 30 tablet 0 01/30/2021   omega-3 acid ethyl esters (LOVAZA) 1 g capsule Take 2 capsules by mouth twice daily 360 capsule 0 Past Week   omeprazole (PRILOSEC) 20 MG capsule Take 1 capsule by mouth once daily 90 capsule 0 01/30/2021   rosuvastatin (CRESTOR) 10 MG tablet TAKE 1 TABLET BY MOUTH AT BEDTIME 90 tablet 0 01/30/2021   tadalafil  (CIALIS) 20 MG tablet Take 0.5-1 tablets (10-20 mg total) by mouth every other day as needed for erectile dysfunction. 30 tablet 0 Past Week   telmisartan-hydrochlorothiazide (MICARDIS HCT) 80-25 MG tablet Take 1 tablet by mouth once daily 90 tablet 0 01/30/2021   timolol (TIMOPTIC) 0.5 % ophthalmic solution INSTILL 1 DROP INTO LEFT EYE TWICE DAILY   01/30/2021   TRESIBA FLEXTOUCH 200 UNIT/ML SOPN Inject 76 Units into the skin daily.   01/30/2021   erythromycin ophthalmic ointment SMARTSIG:In Eye(s) (Patient not taking: Reported on 01/31/2021)   Not Taking   Exenatide ER (BYDUREON BCISE) 2 MG/0.85ML AUIJ Inject into the skin. (Patient not taking: Reported on 01/31/2021)   Not Taking   glimepiride (AMARYL) 2 MG tablet Take by mouth.      Allergies  Allergen Reactions   Demerol [Meperidine] Other (See Comments) and Anaphylaxis    MAKES BLOOD PRESSURE BOTTOM OUT hypotension   Dilaudid [Hydromorphone Hcl]    Hydromorphone     Other reaction(s): Unknown MAKES BLOOD PRESSURE BOTTOM OUT   Meperidine Hcl    Actos [Pioglitazone] Other (See Comments) and Hives   Dapagliflozin Rash   Penicillins Rash   Past Medical History:  Diagnosis Date   Allergy    Chronic kidney disease    Diabetes mellitus without complication (  [place])    Diabetic neuropathy ([place])    Diverticula, colon 1980's   Diverticulosis    Enlarged prostate    GERD (gastroesophageal reflux disease)    Glaucoma    Hemorrhoids    High cholesterol    History of hiatal hernia    Hyperlipidemia    Hypertension    Obesity    Onychomycosis    Seizures (HCC)    Small bowel obstruction ([place]) 1990's   Urination disorder    Past Surgical History:  Procedure Laterality Date   APPENDECTOMY     BLADDER DIVERTICULECTOMY  2013   COLON SURGERY Left 1980's   COLONOSCOPY W/ POLYPECTOMY  1990's   Dr Sharlet Salina   COLONOSCOPY WITH PROPOFOL N/A 12/21/2014   Procedure: COLONOSCOPY WITH PROPOFOL;  Surgeon: Lollie Sails, MD;  Location: [facility]  ENDOSCOPY;  Service: Endoscopy;  Laterality: N/A;   COLONOSCOPY WITH PROPOFOL N/A 08/16/2017   Procedure: COLONOSCOPY WITH PROPOFOL;  Surgeon: Lollie Sails, MD;  Location: [facility] ENDOSCOPY;  Service: Endoscopy;  Laterality: N/A;   ESOPHAGOGASTRODUODENOSCOPY (EGD) WITH PROPOFOL N/A 02/15/2015   Procedure: ESOPHAGOGASTRODUODENOSCOPY (EGD) WITH PROPOFOL;  Surgeon: Lollie Sails, MD;  Location[facility] ENDOSCOPY;  Service: Endoscopy;  Laterality: N/A;   ESOPHAGOGASTRODUODENOSCOPY (EGD) WITH PROPOFOL N/A 06/10/2017   Procedure: ESOPHAGOGASTRODUODENOSCOPY (EGD) WITH PROPOFOL;  Surgeon: Lollie Sails, MD;  Location: [facility] ENDOSCOPY;  Service: Endoscopy;  Laterality: N/A;   ESOPHAGOGASTRODUODENOSCOPY (EGD) WITH PROPOFOL N/A 08/16/2017   Procedure: ESOPHAGOGASTRODUODENOSCOPY (EGD) WITH PROPOFOL;  Surgeon: Lollie Sails, MD;  Location: [facility] ENDOSCOPY;  Service: Endoscopy;  Laterality: N/A;   EYE SURGERY     GLAUCOMA SURGERY Left 1990's   [facility]   HEMORROIDECTOMY  1980's   [facility]   HERNIA REPAIR     [facility], Dr Alfredia Client HERNIA REPAIR Bilateral [phone]   LARYNX SURGERY  1990   3 times   RETINAL DETACHMENT SURGERY Right    TONSILLECTOMY     turp  2013   URETER SURGERY  2013   Dr Jacqlyn Larsen   Social History   Socioeconomic History   Marital status: Married    Spouse name: Pamala Hurry    Number of children: 3   Years of education: Not on file   Highest education level: Professional school degree (e.g., MD, DDS, DVM, JD)  Occupational History   Occupation: Industrial/product designer     Comment: still working - Engineer, maintenance as a Optometrist   Tobacco Use   Smoking status: Never   Smokeless tobacco: Never   Tobacco comments:    smoking cessation materials not required  Vaping Use   Vaping Use: Never used  Substance and Sexual Activity   Alcohol use: No   Drug use: No   Sexual activity: Yes  Other Topics Concern   Not on file  Social History Narrative   Married , still works as a  Engineer, drilling Environmental consultant)   Three children ( one died in his 49's with colon cancer) , one lives in [place] and the other one in [place]   His siblings are in [place] and [place]   Social Determinants of Health   Financial Resource Strain: Low Risk    Difficulty of Paying Living Expenses: Not hard at all  Food Insecurity: No Food Insecurity   Worried About Charity fundraiser in the Last Year: Never true   Arboriculturist in the Last Year: Never true  Transportation Needs: No Transportation Needs   Lack of Transportation (Medical):  No   Lack of Transportation (Non-Medical): No  Physical Activity: Inactive   Days of Exercise per Week: 0 days   Minutes of Exercise per Session: 0 min  Stress: No Stress Concern Present   Feeling of Stress : Not at all  Social Connections: Socially Integrated   Frequency of Communication with Friends and Family: More than three times a week   Frequency of Social Gatherings with Friends and Family: Once a week   Attends Religious Services: More than 4 times per year   Active Member of [employer] or Organizations: Yes   Attends Archivist Meetings: Never   Marital Status: Married  Human resources officer Violence: Not At Risk   Fear of Current or Ex-Partner: No   Emotionally Abused: No   Physically Abused: No   Sexually Abused: No   Social History   Social History Narrative   Married , still works as a Engineer, drilling Environmental consultant)   Three children ( one died in his 47's with colon cancer) , one lives in [place] and the other one in [place]   His siblings are in [place] and [place]     ROS: Negative.     PE: HEENT: Negative. Lungs: Clear. Cardio: RR.  Assessment/Plan:  Proceed with planned endoscopy  Jeremy [name] [facility] 01/31/2021   .

## 2021-01-31 NOTE — Transfer of Care (Signed)
Immediate Anesthesia Transfer of Care Note  Patient: Jeremy [name]  Procedure(s) Performed: ESOPHAGOGASTRODUODENOSCOPY (EGD) WITH PROPOFOL COLONOSCOPY WITH PROPOFOL  Patient Location: PACU and Endoscopy Unit  Anesthesia Type:General  Level of Consciousness: drowsy and patient cooperative  Airway & Oxygen Therapy: Patient Spontanous Breathing  Post-op Assessment: Report given to RN and Post -op Vital signs reviewed and stable  Post vital signs: Reviewed and stable  Last Vitals:  Vitals Value Taken Time  BP 109/58 01/31/21 0816  Temp 36 C 01/31/21 0816  Pulse 59 01/31/21 0817  Resp 15 01/31/21 0817  SpO2 93 % 01/31/21 0817  Vitals shown include unvalidated device data.  Last Pain:  Vitals:   01/31/21 0816  TempSrc: Temporal  PainSc: Asleep         Complications: No notable events documented.

## 2021-01-31 NOTE — Anesthesia Postprocedure Evaluation (Signed)
Anesthesia Post Note  Patient: Cedrik Guyton Nile  Procedure(s) Performed: ESOPHAGOGASTRODUODENOSCOPY (EGD) WITH PROPOFOL COLONOSCOPY WITH PROPOFOL  Patient location during evaluation: Endoscopy Anesthesia Type: General Level of consciousness: awake and alert Pain management: pain level controlled Vital Signs Assessment: post-procedure vital signs reviewed and stable Respiratory status: spontaneous breathing, nonlabored ventilation, respiratory function stable and patient connected to nasal cannula oxygen Cardiovascular status: blood pressure returned to baseline and stable Postop Assessment: no apparent nausea or vomiting Anesthetic complications: no   No notable events documented.   Last Vitals:  Vitals:   01/31/21 0826 01/31/21 0836  BP: 117/64 123/70  Pulse: 62 (!) 54  Resp: 15 13  Temp:    SpO2: 94% 95%    Last Pain:  Vitals:   01/31/21 0836  TempSrc:   PainSc: 0-No pain                 Precious Haws Lonzie Simmer

## 2021-01-31 NOTE — Op Note (Signed)
[facility] Gastroenterology Patient Name: Jeremy [name] Procedure Date: 01/31/2021 7:47 AM MRN: [id number] Account #: [id number] Date of Birth: 1935/07/05 Admit Type: Outpatient Age: 85 Room: [facility] ENDO ROOM 1 Gender: Male Note Status: Finalized Procedure:             Upper GI endoscopy Indications:           Follow-up of esophageal varices with bleeding Providers:             Robert Bellow, MD Referring MD:          Bethena Roys. Sowles, MD (Referring MD) Medicines:             Propofol per Anesthesia Complications:         No immediate complications. Procedure:             Pre-Anesthesia Assessment:                        - Prior to the procedure, a History and Physical was                         performed, and patient medications, allergies and                         sensitivities were reviewed. The patient's tolerance                         of previous anesthesia was reviewed.                        - The risks and benefits of the procedure and the                         sedation options and risks were discussed with the                         patient. All questions were answered and informed                         consent was obtained.                        After obtaining informed consent, the endoscope was                         passed under direct vision. Throughout the procedure,                         the patient's blood pressure, pulse, and oxygen                         saturations were monitored continuously. The                         Endosonoscope was introduced through the mouth, and                         advanced to the second part of duodenum. The upper GI  endoscopy was accomplished without difficulty. The                         patient tolerated the procedure well. Findings:      The esophagus was normal.      A medium-sized hiatal hernia was present.      Patchy mild inflammation characterized by  erythema was found in the       prepyloric region of the stomach.      The examined duodenum was normal. Impression:            - Normal esophagus.                        - Medium-sized hiatal hernia.                        - Chronic gastritis.                        - Normal examined duodenum.                        - No specimens collected. Recommendation:        - Perform a colonoscopy today. Procedure Code(s):     --- Professional ---                        [phone], Esophagogastroduodenoscopy, flexible,                         transoral; diagnostic, including collection of                         specimen(s) by brushing or washing, when performed                         (separate procedure) Diagnosis Code(s):     --- Professional ---                        K44.9, Diaphragmatic hernia without obstruction or                         gangrene                        K29.50, Unspecified chronic gastritis without bleeding                        I85.01, Esophageal varices with bleeding CPT copyright 2019 American Medical Association. All rights reserved. The codes documented in this report are preliminary and upon coder review may  be revised to meet current compliance requirements. Robert Bellow, MD 01/31/2021 7:56:36 AM This report has been signed electronically. Number of Addenda: 0 Note Initiated On: 01/31/2021 7:47 AM      [facility]

## 2021-02-03 ENCOUNTER — Encounter: Payer: Self-pay | Admitting: General Surgery

## 2021-02-06 DIAGNOSIS — Z794 Long term (current) use of insulin: Secondary | ICD-10-CM | POA: Diagnosis not present

## 2021-02-06 DIAGNOSIS — E1142 Type 2 diabetes mellitus with diabetic polyneuropathy: Secondary | ICD-10-CM | POA: Diagnosis not present

## 2021-02-06 DIAGNOSIS — E1159 Type 2 diabetes mellitus with other circulatory complications: Secondary | ICD-10-CM | POA: Diagnosis not present

## 2021-02-06 DIAGNOSIS — E1169 Type 2 diabetes mellitus with other specified complication: Secondary | ICD-10-CM | POA: Diagnosis not present

## 2021-02-06 DIAGNOSIS — E785 Hyperlipidemia, unspecified: Secondary | ICD-10-CM | POA: Diagnosis not present

## 2021-02-06 DIAGNOSIS — I152 Hypertension secondary to endocrine disorders: Secondary | ICD-10-CM | POA: Diagnosis not present

## 2021-02-06 LAB — HEMOGLOBIN A1C: Hemoglobin A1C: 6.3

## 2021-02-10 ENCOUNTER — Other Ambulatory Visit: Payer: Self-pay | Admitting: Family Medicine

## 2021-02-27 DIAGNOSIS — Z23 Encounter for immunization: Secondary | ICD-10-CM | POA: Diagnosis not present

## 2021-03-01 ENCOUNTER — Other Ambulatory Visit: Payer: Self-pay | Admitting: Family Medicine

## 2021-03-01 DIAGNOSIS — E782 Mixed hyperlipidemia: Secondary | ICD-10-CM

## 2021-03-01 DIAGNOSIS — E785 Hyperlipidemia, unspecified: Secondary | ICD-10-CM

## 2021-03-04 ENCOUNTER — Other Ambulatory Visit: Payer: Self-pay

## 2021-03-04 ENCOUNTER — Ambulatory Visit (INDEPENDENT_AMBULATORY_CARE_PROVIDER_SITE_OTHER): Payer: Medicare Other

## 2021-03-04 VITALS — BP 110/72 | HR 73 | Temp 98.0°F | Resp 16 | Ht 76.0 in | Wt 252.5 lb

## 2021-03-04 DIAGNOSIS — Z23 Encounter for immunization: Secondary | ICD-10-CM | POA: Diagnosis not present

## 2021-03-04 DIAGNOSIS — Z0000 Encounter for general adult medical examination without abnormal findings: Secondary | ICD-10-CM | POA: Diagnosis not present

## 2021-03-04 NOTE — Progress Notes (Signed)
Subjective:   Jeremy [name] is a 85 y.o. male who presents for Medicare Annual/Subsequent preventive examination.  Review of Systems     Cardiac Risk Factors include: advanced age (>85men, >6 women);diabetes mellitus;dyslipidemia;male gender;hypertension;obesity (BMI >30kg/m2)     Objective:    Today's Vitals   03/04/21 1344  BP: 110/72  Pulse: 73  Resp: 16  Temp: 98 F (36.7 C)  TempSrc: Oral  SpO2: 94%  Weight: 252 lb 8 oz (114.5 kg)  Height: 6\' 4"  (1.93 m)   Body mass index is 30.74 kg/m.  Advanced Directives 03/04/2021 01/31/2021 02/29/2020 02/28/2019 02/25/2018 08/16/2017 04/08/2017  Does Patient Have a Medical Advance Directive? Yes Yes Yes Yes Yes Yes Yes  Type of Paramedic of [place];Living will Living will;Healthcare Power of [place];Living will [place];Living will [place];Living will [place];Living will [place];Living will  Does patient want to make changes to medical advance directive? - - - - - - -  Copy of [place] in Chart? Yes - validated most recent copy scanned in chart (See row information) No - copy requested Yes - validated most recent copy scanned in chart (See row information) Yes - validated most recent copy scanned in chart (See row information) Yes No - copy requested No - copy requested  Would patient like information on creating a medical advance directive? - - - - - - -    Current Medications (verified) Outpatient Encounter Medications as of 03/04/2021  Medication Sig   bisoprolol (ZEBETA) 10 MG tablet Take 1 tablet by mouth once daily   brimonidine (ALPHAGAN P) 0.1 % SOLN Place 1 drop into the left eye 2 (two) times daily.    desonide (DESOWEN) 0.05 % ointment Apply to rash under eye QD-BID PRN   dorzolamide (TRUSOPT) 2 % ophthalmic solution Place 1 drop into the left eye 2 (two) times daily.    Exenatide ER (BYDUREON BCISE) 2 MG/0.85ML AUIJ Inject into the skin.   finasteride (PROSCAR) 5 MG tablet Take 1 tablet by mouth once daily   hydrocortisone 2.5 % cream Apply to aa's face and ears every other night alternating with Ketoconazole cream   insulin degludec (TRESIBA FLEXTOUCH) 200 UNIT/ML FlexTouch Pen Inject 74 Units into the skin in the morning.   INVOKANA 300 MG TABS tablet TAKE ONE TABLET BY MOUTH ONCE DAILY   ketoconazole (NIZORAL) 2 % cream Apply to aa's face and ears every other night alternating with Hydrocortisone cream.   ketoconazole (NIZORAL) 2 % shampoo Apply 1 application topically 3 (three) times a week. Shampoo scalp 3 days a week let sit 5 minutes and rinse off   metFORMIN (GLUCOPHAGE) 1000 MG tablet Take 1 tablet (1,000 mg total) by mouth 2 (two) times daily.   mometasone (ELOCON) 0.1 % lotion Apply topically 3 (three) times a week. Apply to aa scalp 3 nights a week   naproxen sodium (ALEVE) 220 MG tablet Take 1 tablet (220 mg total) by mouth daily as needed.   omega-3 acid ethyl esters (LOVAZA) 1 g capsule Take 2 capsules by mouth twice daily   omeprazole (PRILOSEC) 20 MG capsule Take 1 capsule by mouth once daily   rosuvastatin (CRESTOR) 10 MG tablet TAKE 1 TABLET BY MOUTH AT BEDTIME   tadalafil (CIALIS) 20 MG tablet Take 0.5-1 tablets (10-20 mg total) by mouth every other day as needed for erectile dysfunction.   telmisartan-hydrochlorothiazide (MICARDIS HCT)  80-25 MG tablet Take 1 tablet by mouth once daily   timolol (TIMOPTIC) 0.5 % ophthalmic solution INSTILL 1 DROP INTO LEFT EYE TWICE DAILY   [DISCONTINUED] glimepiride (AMARYL) 2 MG tablet Take by mouth.   [DISCONTINUED] erythromycin ophthalmic ointment    [DISCONTINUED] TRESIBA FLEXTOUCH 200 UNIT/ML SOPN Inject 76 Units into the skin daily.   No facility-administered encounter medications on file as of 03/04/2021.    Allergies (verified) Demerol [meperidine], Dilaudid [hydromorphone hcl], Hydromorphone,  Meperidine hcl, Actos [pioglitazone], Dapagliflozin, and Penicillins   History: Past Medical History:  Diagnosis Date   Allergy    Chronic kidney disease    Diabetes mellitus without complication (HCC)    Diabetic neuropathy ([place])    Diverticula, colon 1980's   Diverticulosis    Enlarged prostate    GERD (gastroesophageal reflux disease)    Glaucoma    Hemorrhoids    High cholesterol    History of hiatal hernia    Hyperlipidemia    Hypertension    Obesity    Onychomycosis    Seizures (HCC)    Small bowel obstruction ([place]) 1990's   Urination disorder    Past Surgical History:  Procedure Laterality Date   APPENDECTOMY     BLADDER DIVERTICULECTOMY  2013   COLON SURGERY Left 1980's   COLONOSCOPY W/ POLYPECTOMY  1990's   Dr Sharlet Salina   COLONOSCOPY WITH PROPOFOL N/A 12/21/2014   Procedure: COLONOSCOPY WITH PROPOFOL;  Surgeon: Lollie Sails, MD;  Location[facility] ENDOSCOPY;  Service: Endoscopy;  Laterality: N/A;   COLONOSCOPY WITH PROPOFOL N/A 08/16/2017   Procedure: COLONOSCOPY WITH PROPOFOL;  Surgeon: Lollie Sails, MD;  Location: [facility] ENDOSCOPY;  Service: Endoscopy;  Laterality: N/A;   COLONOSCOPY WITH PROPOFOL N/A 01/31/2021   Procedure: COLONOSCOPY WITH PROPOFOL;  Surgeon: Robert Bellow, MD;  Location: [facility] ENDOSCOPY;  Service: Endoscopy;  Laterality: N/A;   ESOPHAGOGASTRODUODENOSCOPY (EGD) WITH PROPOFOL N/A 02/15/2015   Procedure: ESOPHAGOGASTRODUODENOSCOPY (EGD) WITH PROPOFOL;  Surgeon: Lollie Sails, MD;  Location: [facility] ENDOSCOPY;  Service: Endoscopy;  Laterality: N/A;   ESOPHAGOGASTRODUODENOSCOPY (EGD) WITH PROPOFOL N/A 06/10/2017   Procedure: ESOPHAGOGASTRODUODENOSCOPY (EGD) WITH PROPOFOL;  Surgeon: Lollie Sails, MD;  Location[facility] ENDOSCOPY;  Service: Endoscopy;  Laterality: N/A;   ESOPHAGOGASTRODUODENOSCOPY (EGD) WITH PROPOFOL N/A 08/16/2017   Procedure: ESOPHAGOGASTRODUODENOSCOPY (EGD) WITH PROPOFOL;  Surgeon: Lollie Sails, MD;  Location: [facility]  ENDOSCOPY;  Service: Endoscopy;  Laterality: N/A;   ESOPHAGOGASTRODUODENOSCOPY (EGD) WITH PROPOFOL N/A 01/31/2021   Procedure: ESOPHAGOGASTRODUODENOSCOPY (EGD) WITH PROPOFOL;  Surgeon: Robert Bellow, MD;  Location: [facility] ENDOSCOPY;  Service: Endoscopy;  Laterality: N/A;   EYE SURGERY     GLAUCOMA SURGERY Left 1990's   [facility]   HEMORROIDECTOMY  1980's   [facility]   HERNIA REPAIR     [facility], Dr Alfredia Client HERNIA REPAIR Bilateral [phone]   LARYNX SURGERY  1990   3 times   RETINAL DETACHMENT SURGERY Right    TONSILLECTOMY     turp  2013   URETER SURGERY  2013   Dr Jacqlyn Larsen   Family History  Problem Relation Age of Onset   Diabetes Mother    Cataracts Mother    Metabolic syndrome Mother    Blindness Mother        r/t diabetes   Cataracts Father    Glaucoma Father        Retina-Detachment   Atrial fibrillation Father    Diverticulitis Sister    Glaucoma Brother    Diabetes  Brother    Cancer Sister        Gallbladder   Cancer Daughter    Social History   Socioeconomic History   Marital status: Married    Spouse name: Pamala Hurry    Number of children: 3   Years of education: Not on file   Highest education level: Professional school degree (e.g., MD, DDS, DVM, JD)  Occupational History   Occupation: Industrial/product designer     Comment: still working - Engineer, maintenance as a Optometrist   Tobacco Use   Smoking status: Never   Smokeless tobacco: Never   Tobacco comments:    smoking cessation materials not required  Vaping Use   Vaping Use: Never used  Substance and Sexual Activity   Alcohol use: No   Drug use: No   Sexual activity: Yes  Other Topics Concern   Not on file  Social History Narrative   Married , still works as a Engineer, drilling Environmental consultant)   Three children ( one died in his 42's with colon cancer) , one lives in [place] and the other one in [place]   His siblings are in [place] and [place]   Social Determinants of Health   Financial Resource Strain: Low Risk     Difficulty of Paying Living Expenses: Not hard at all  Food Insecurity: No Food Insecurity   Worried About Charity fundraiser in the Last Year: Never true   Arboriculturist in the Last Year: Never true  Transportation Needs: No Transportation Needs   Lack of Transportation (Medical): No   Lack of Transportation (Non-Medical): No  Physical Activity: Inactive   Days of Exercise per Week: 0 days   Minutes of Exercise per Session: 0 min  Stress: No Stress Concern Present   Feeling of Stress : Not at all  Social Connections: Socially Integrated   Frequency of Communication with Friends and Family: More than three times a week   Frequency of Social Gatherings with Friends and Family: Once a week   Attends Religious Services: More than 4 times per year   Active Member of [employer] or Organizations: Yes   Attends Archivist Meetings: Never   Marital Status: Married    Tobacco Counseling Counseling given: Not Answered Tobacco comments: smoking cessation materials not required   Clinical Intake:  Pre-visit preparation completed: Yes  Pain : No/denies pain     BMI - recorded: 30.74 Nutritional Status: BMI > 30  Obese Nutritional Risks: None Diabetes: Yes CBG done?: No Did pt. bring in CBG monitor from home?: No  How often do you need to have someone help you when you read instructions, pamphlets, or other written materials from your doctor or pharmacy?: 1 - Never    Interpreter Needed?: No  Information entered by :: Clemetine Marker LPN   Activities of Daily Living In your present state of health, do you have any difficulty performing the following activities: 03/04/2021 10/29/2020  Hearing? N N  Vision? N N  Difficulty concentrating or making decisions? N N  Walking or climbing stairs? N N  Dressing or bathing? N N  Doing errands, shopping? N N  Preparing Food and eating ? N -  Using the Toilet? N -  In the past six months, have you accidently leaked urine? N -  Do  you have problems with loss of bowel control? N -  Managing your Medications? N -  Managing your Finances? N -  Housekeeping or managing your Housekeeping? N -  Some recent data might be hidden    Patient Care Team: Steele Sizer, MD as PCP - General (Family Medicine) Requested, Self Birder Robson, MD as Consulting Physician (Ophthalmology) Earnestine Leys, MD as Consulting Physician (Orthopedic Surgery) Isaias Cowman, MD as Consulting Physician (Cardiology) Lonia Farber, MD as Consulting Physician (Internal Medicine) Sharlotte Alamo, DPM (Podiatry) Ralene Bathe, MD (Dermatology) Bary Castilla Forest Gleason, MD as Consulting Physician (General Surgery)  Indicate any recent Medical Services you may have received from other than [facility] providers in the past year (date may be approximate).     Assessment:   This is a routine wellness examination for Dash[place].  Hearing/Vision screen Hearing Screening - Comments:: Pt denies hearing difficulty  Vision Screening - Comments:: Annual vision screenings done by Dr. Michelene Heady [facility]  Dietary issues and exercise activities discussed: Current Exercise Habits: The patient does not participate in regular exercise at present, Exercise limited by: None identified   Goals Addressed   None    Depression Screen PHQ 2/9 Scores 03/04/2021 10/29/2020 05/01/2020 02/29/2020 01/10/2020 10/30/2019 03/03/2019  PHQ - 2 Score 0 0 0 0 0 0 0  PHQ- 9 Score - - - - 0 0 0    Fall Risk Fall Risk  03/04/2021 10/29/2020 05/01/2020 02/29/2020 01/10/2020  Falls in the past year? 0 0 0 0 0  Number falls in past yr: 0 0 0 0 0  Injury with Fall? 0 0 0 0 0  Risk for fall due to : No Fall Risks - - No Fall Risks -  Risk for fall due to: Comment - - - - -  Follow up Falls prevention discussed - - Falls prevention discussed -    FALL RISK PREVENTION PERTAINING TO THE HOME:  Any stairs in or around the home? Yes  If so, are there any without handrails?  No  Home free of loose throw rugs in walkways, pet beds, electrical cords, etc? Yes  Adequate lighting in your home to reduce risk of falls? Yes   ASSISTIVE DEVICES UTILIZED TO PREVENT FALLS:  Life alert? No  Use of a cane, walker or w/c? No  Grab bars in the bathroom? Yes  Shower chair or bench in shower? No  Elevated toilet seat or a handicapped toilet? Yes   TIMED UP AND GO:  Was the test performed? Yes .  Length of time to ambulate 10 feet: 7 sec.   Gait slow and steady without use of assistive device  Cognitive Function:     6CIT Screen 02/28/2019 02/25/2018  What Year? 0 points 0 points  What month? 0 points 0 points  What time? 0 points 0 points  Count back from 20 0 points 0 points  Months in reverse 0 points 0 points  Repeat phrase 0 points 4 points  Total Score 0 4    Immunizations Immunization History  Administered Date(s) Administered   Fluad Quad(high Dose 65+) 02/28/2019, 03/04/2021   Influenza, High Dose Seasonal PF 03/12/2016, 02/22/2017, 02/25/2018   Influenza,inj,Quad PF,6+ Mos 03/28/2015   Influenza-Unspecified 02/13/2020   Moderna Sars-Covid-2 Vaccination 09/20/2020   PFIZER(Purple Top)SARS-COV-2 Vaccination 06/21/2019, 07/12/2019, 03/12/2020   Pfizer Covid-19 Vaccine Bivalent Booster 3yrs & up 02/28/2021   Pneumococcal Conjugate-13 08/07/2013, 09/04/2013   Pneumococcal Polysaccharide-23 04/04/2012   Tdap 04/04/2012   Zoster Recombinat (Shingrix) 12/14/2017, 02/21/2018   Zoster, Live 09/17/2010    TDAP status: Up to date  Flu Vaccine status: Completed at today's visit  Pneumococcal vaccine status: Up to date  Covid-19 vaccine status: Completed vaccines  Qualifies for Shingles Vaccine? Yes   Zostavax completed Yes   Shingrix Completed?: Yes  Screening Tests Health Maintenance  Topic Date Due   COVID-19 Vaccine (5 - Booster for Pfizer series) 01/20/2021   HEMOGLOBIN A1C  05/01/2021   FOOT EXAM  08/28/2021   OPHTHALMOLOGY EXAM   10/28/2021   TETANUS/TDAP  04/04/2022   COLONOSCOPY (Pts 45-20yrs Insurance coverage will need to be confirmed)  02/01/2024   INFLUENZA VACCINE  Completed   Zoster Vaccines- Shingrix  Completed   HPV VACCINES  Aged Out    Health Maintenance  Health Maintenance Due  Topic Date Due   COVID-19 Vaccine (5 - Booster for [place] series) 01/20/2021    Colorectal cancer screening: Type of screening: Colonoscopy. Completed 01/31/21. Repeat every 3 years  Lung Cancer Screening: (Low Dose CT Chest recommended if Age 72-80 years, 30 pack-year currently smoking OR have quit w/in 15years.) does not qualify.   Additional Screening:  Hepatitis C Screening: does not qualify  Vision Screening: Recommended annual ophthalmology exams for early detection of glaucoma and other disorders of the eye. Is the patient up to date with their annual eye exam?  Yes  Who is the provider or what is the name of the office in which the patient attends annual eye exams? [facility].   Dental Screening: Recommended annual dental exams for proper oral hygiene  Community Resource Referral / Chronic Care Management: CRR required this visit?  No   CCM required this visit?  No      Plan:     I have personally reviewed and noted the following in the patient's chart:   Medical and social history Use of alcohol, tobacco or illicit drugs  Current medications and supplements including opioid prescriptions. Patient is not currently taking opioid prescriptions. Functional ability and status Nutritional status Physical activity Advanced directives List of other physicians Hospitalizations, surgeries, and ER visits in previous 12 months Vitals Screenings to include cognitive, depression, and falls Referrals and appointments  In addition, I have reviewed and discussed with patient certain preventive protocols, quality metrics, and best practice recommendations. A written personalized care plan for preventive  services as well as general preventive health recommendations were provided to patient.     Clemetine Marker, LPN   [id number]   Nurse Notes: none

## 2021-03-04 NOTE — Patient Instructions (Signed)
Jeremy [name] , Thank you for taking time to come for your Medicare Wellness Visit. I appreciate your ongoing commitment to your health goals. Please review the following plan we discussed and let me know if I can assist you in the future.   Screening recommendations/referrals: Colonoscopy: done 01/31/21 Recommended yearly ophthalmology/optometry visit for glaucoma screening and checkup Recommended yearly dental visit for hygiene and checkup  Vaccinations: Influenza vaccine: done today Pneumococcal vaccine: done 09/04/13 Tdap vaccine: done 04/04/12 Shingles vaccine: done 12/14/17 & 02/21/18   Covid-19: done 06/21/19, 07/12/19, 03/12/20, 09/20/20 & 02/28/21  Conditions/risks identified: Recommend increasing physical activity to at least 3 days per week   Next appointment: Follow up in one year for your annual wellness visit.   Preventive Care 6 Years and Older, Male Preventive care refers to lifestyle choices and visits with your health care provider that can promote health and wellness. What does preventive care include? A yearly physical exam. This is also called an annual well check. Dental exams once or twice a year. Routine eye exams. Ask your health care provider how often you should have your eyes checked. Personal lifestyle choices, including: Daily care of your teeth and gums. Regular physical activity. Eating a healthy diet. Avoiding tobacco and drug use. Limiting alcohol use. Practicing safe sex. Taking low doses of aspirin every day. Taking vitamin and mineral supplements as recommended by your health care provider. What happens during an annual well check? The services and screenings done by your health care provider during your annual well check will depend on your age, overall health, lifestyle risk factors, and family history of disease. Counseling  Your health care provider may ask you questions about your: Alcohol use. Tobacco use. Drug use. Emotional well-being. Home and  relationship well-being. Sexual activity. Eating habits. History of falls. Memory and ability to understand (cognition). Work and work Statistician. Screening  You may have the following tests or measurements: Height, weight, and BMI. Blood pressure. Lipid and cholesterol levels. These may be checked every 5 years, or more frequently if you are over 6 years old. Skin check. Lung cancer screening. You may have this screening every year starting at age 77 if you have a 30-pack-year history of smoking and currently smoke or have quit within the past 15 years. Fecal occult blood test (FOBT) of the stool. You may have this test every year starting at age 92. Flexible sigmoidoscopy or colonoscopy. You may have a sigmoidoscopy every 5 years or a colonoscopy every 10 years starting at age 11. Prostate cancer screening. Recommendations will vary depending on your family history and other risks. Hepatitis C blood test. Hepatitis B blood test. Sexually transmitted disease (STD) testing. Diabetes screening. This is done by checking your blood sugar (glucose) after you have not eaten for a while (fasting). You may have this done every 1-3 years. Abdominal aortic aneurysm (AAA) screening. You may need this if you are a current or former smoker. Osteoporosis. You may be screened starting at age 69 if you are at high risk. Talk with your health care provider about your test results, treatment options, and if necessary, the need for more tests. Vaccines  Your health care provider may recommend certain vaccines, such as: Influenza vaccine. This is recommended every year. Tetanus, diphtheria, and acellular pertussis (Tdap, Td) vaccine. You may need a Td booster every 10 years. Zoster vaccine. You may need this after age 79. Pneumococcal 13-valent conjugate (PCV13) vaccine. One dose is recommended after age 26. Pneumococcal polysaccharide (PPSV23)  vaccine. One dose is recommended after age 4. Talk to your  health care provider about which screenings and vaccines you need and how often you need them. This information is not intended to replace advice given to you by your health care provider. Make sure you discuss any questions you have with your health care provider. Document Released: 06/28/2015 Document Revised: 02/19/2016 Document Reviewed: 04/02/2015 Elsevier Interactive Patient Education  2017 [place] Prevention in the Home Falls can cause injuries. They can happen to people of all ages. There are many things you can do to make your home safe and to help prevent falls. What can I do on the outside of my home? Regularly fix the edges of walkways and driveways and fix any cracks. Remove anything that might make you trip as you walk through a door, such as a raised step or threshold. Trim any bushes or trees on the path to your home. Use bright outdoor lighting. Clear any walking paths of anything that might make someone trip, such as rocks or tools. Regularly check to see if handrails are loose or broken. Make sure that both sides of any steps have handrails. Any raised decks and porches should have guardrails on the edges. Have any leaves, snow, or ice cleared regularly. Use sand or salt on walking paths during winter. Clean up any spills in your garage right away. This includes oil or grease spills. What can I do in the bathroom? Use night lights. Install grab bars by the toilet and in the tub and shower. Do not use towel bars as grab bars. Use non-skid mats or decals in the tub or shower. If you need to sit down in the shower, use a plastic, non-slip stool. Keep the floor dry. Clean up any water that spills on the floor as soon as it happens. Remove soap buildup in the tub or shower regularly. Attach bath mats securely with double-sided non-slip rug tape. Do not have throw rugs and other things on the floor that can make you trip. What can I do in the bedroom? Use night  lights. Make sure that you have a light by your bed that is easy to reach. Do not use any sheets or blankets that are too big for your bed. They should not hang down onto the floor. Have a firm chair that has side arms. You can use this for support while you get dressed. Do not have throw rugs and other things on the floor that can make you trip. What can I do in the kitchen? Clean up any spills right away. Avoid walking on wet floors. Keep items that you use a lot in easy-to-reach places. If you need to reach something above you, use a strong step stool that has a grab bar. Keep electrical cords out of the way. Do not use floor polish or wax that makes floors slippery. If you must use wax, use non-skid floor wax. Do not have throw rugs and other things on the floor that can make you trip. What can I do with my stairs? Do not leave any items on the stairs. Make sure that there are handrails on both sides of the stairs and use them. Fix handrails that are broken or loose. Make sure that handrails are as long as the stairways. Check any carpeting to make sure that it is firmly attached to the stairs. Fix any carpet that is loose or worn. Avoid having throw rugs at the top or bottom  of the stairs. If you do have throw rugs, attach them to the floor with carpet tape. Make sure that you have a light switch at the top of the stairs and the bottom of the stairs. If you do not have them, ask someone to add them for you. What else can I do to help prevent falls? Wear shoes that: Do not have high heels. Have rubber bottoms. Are comfortable and fit you well. Are closed at the toe. Do not wear sandals. If you use a stepladder: Make sure that it is fully opened. Do not climb a closed stepladder. Make sure that both sides of the stepladder are locked into place. Ask someone to hold it for you, if possible. Clearly mark and make sure that you can see: Any grab bars or handrails. First and last  steps. Where the edge of each step is. Use tools that help you move around (mobility aids) if they are needed. These include: Canes. Walkers. Scooters. Crutches. Turn on the lights when you go into a dark area. Replace any light bulbs as soon as they burn out. Set up your furniture so you have a clear path. Avoid moving your furniture around. If any of your floors are uneven, fix them. If there are any pets around you, be aware of where they are. Review your medicines with your doctor. Some medicines can make you feel dizzy. This can increase your chance of falling. Ask your doctor what other things that you can do to help prevent falls. This information is not intended to replace advice given to you by your health care provider. Make sure you discuss any questions you have with your health care provider. Document Released: 03/28/2009 Document Revised: 11/07/2015 Document Reviewed: 07/06/2014 Elsevier Interactive Patient Education  2017 [employer].

## 2021-03-05 DIAGNOSIS — B351 Tinea unguium: Secondary | ICD-10-CM | POA: Diagnosis not present

## 2021-03-05 DIAGNOSIS — L03031 Cellulitis of right toe: Secondary | ICD-10-CM | POA: Diagnosis not present

## 2021-03-05 DIAGNOSIS — E1142 Type 2 diabetes mellitus with diabetic polyneuropathy: Secondary | ICD-10-CM | POA: Diagnosis not present

## 2021-03-24 ENCOUNTER — Other Ambulatory Visit: Payer: Self-pay | Admitting: Family Medicine

## 2021-03-24 DIAGNOSIS — I152 Hypertension secondary to endocrine disorders: Secondary | ICD-10-CM

## 2021-03-24 DIAGNOSIS — I10 Essential (primary) hypertension: Secondary | ICD-10-CM

## 2021-03-24 DIAGNOSIS — E1159 Type 2 diabetes mellitus with other circulatory complications: Secondary | ICD-10-CM

## 2021-03-24 DIAGNOSIS — E1129 Type 2 diabetes mellitus with other diabetic kidney complication: Secondary | ICD-10-CM

## 2021-04-07 ENCOUNTER — Other Ambulatory Visit: Payer: Self-pay | Admitting: Family Medicine

## 2021-04-07 DIAGNOSIS — E782 Mixed hyperlipidemia: Secondary | ICD-10-CM

## 2021-04-07 DIAGNOSIS — E1169 Type 2 diabetes mellitus with other specified complication: Secondary | ICD-10-CM

## 2021-04-08 ENCOUNTER — Other Ambulatory Visit: Payer: Self-pay | Admitting: Dermatology

## 2021-04-08 DIAGNOSIS — L219 Seborrheic dermatitis, unspecified: Secondary | ICD-10-CM

## 2021-04-08 NOTE — Telephone Encounter (Signed)
Requested Prescriptions  Pending Prescriptions Disp Refills  . omega-3 acid ethyl esters (LOVAZA) 1 g capsule [Pharmacy Med Name: Omega-3-acid Ethyl Esters 1 GM Oral Capsule] 240 capsule 0    Sig: Take 2 capsules by mouth twice daily     Endocrinology:  Nutritional Agents Passed - 04/07/2021  7:37 PM      Passed - Valid encounter within last 12 months    Recent Outpatient Visits          5 months ago Type 2 diabetes mellitus with microalbuminuria, with long-term current use of insulin [facility])   [facility] Steele Sizer, MD   11 months ago Hypertension associated with diabetes [facility])   [facility] Steele Sizer, MD   1 year ago Productive cough   [facility] Steele Sizer, MD   1 year ago Mixed hyperlipidemia   [facility] [place], Drue Stager, MD   1 year ago Skin ulcer of left lower leg, unspecified ulcer stage [facility])   [facility] Steele Sizer, MD      Future Appointments            In 3 weeks Ancil Boozer, Drue Stager, MD [facility], [place]   In 11 months  [facility], [facility]

## 2021-04-20 ENCOUNTER — Other Ambulatory Visit: Payer: Self-pay | Admitting: Family Medicine

## 2021-04-20 DIAGNOSIS — E782 Mixed hyperlipidemia: Secondary | ICD-10-CM

## 2021-04-20 DIAGNOSIS — E1169 Type 2 diabetes mellitus with other specified complication: Secondary | ICD-10-CM

## 2021-04-20 NOTE — Telephone Encounter (Signed)
Requested medication (s) are due for refill today: yes  Requested medication (s) are on the active medication list: yes  Last refill:  01/06/21  Future visit scheduled: yes  Notes to clinic:  overdue lab work   Requested Prescriptions  Pending Prescriptions Disp Refills   rosuvastatin (CRESTOR) 10 MG tablet [Pharmacy Med Name: Rosuvastatin Calcium 10 MG Oral Tablet] 90 tablet 0    Sig: TAKE 1 TABLET BY MOUTH AT BEDTIME     Cardiovascular:  Antilipid - Statins Failed - 04/20/2021  6:32 AM      Failed - Total Cholesterol in normal range and within 360 days    Cholesterol  Date Value Ref Range Status  02/28/2020 115 <200 mg/dL Final          Failed - LDL in normal range and within 360 days    LDL Cholesterol (Calc)  Date Value Ref Range Status  02/28/2020 51 mg/dL (calc) Final    Comment:    Reference range: <100 . Desirable range <100 mg/dL for primary prevention;   <70 mg/dL for patients with CHD or diabetic patients  with > or = 2 CHD risk factors. Marland Kitchen LDL-C is now calculated using the Martin-Hopkins  calculation, which is a validated novel method providing  better accuracy than the Friedewald equation in the  estimation of LDL-C.  Cresenciano Genre et al. Annamaria Helling. 0539;767(34): 2061-2068  (http://education.[facility].com/faq/FAQ164)           Failed - HDL in normal range and within 360 days    HDL  Date Value Ref Range Status  02/28/2020 42 > OR = 40 mg/dL Final          Failed - Triglycerides in normal range and within 360 days    Triglycerides  Date Value Ref Range Status  02/28/2020 141 <150 mg/dL Final          Passed - Patient is not pregnant      Passed - Valid encounter within last 12 months    Recent Outpatient Visits           5 months ago Type 2 diabetes mellitus with microalbuminuria, with long-term current use of insulin [facility])   [facility] [place], Drue Stager, MD   11 months ago Hypertension associated with diabetes [facility])    [facility] Steele Sizer, MD   1 year ago Productive cough   [facility] Steele Sizer, MD   1 year ago Mixed hyperlipidemia   [facility] [place], Drue Stager, MD   1 year ago Skin ulcer of left lower leg, unspecified ulcer stage [facility])   [facility] Steele Sizer, MD       Future Appointments             In 2 weeks Ancil Boozer, Drue Stager, MD [facility], [place]   In 10 months  [facility], [facility]

## 2021-04-30 DIAGNOSIS — H2702 Aphakia, left eye: Secondary | ICD-10-CM | POA: Diagnosis not present

## 2021-05-02 NOTE — Progress Notes (Signed)
Name: Jeremy [name]   MRN: [id number]    DOB: Nov 08, 1935   Date:05/05/2021       Progress Note  Subjective  Chief Complaint  Follow Up  HPI  DMII: he was diagnosed in the 38's during a hospital stay for small bowel obstruction. He was on oral medications for a long time, started on insulin a couple of years ago. Currently seeing Endocrinologist Dr. Honor Junes at [facility] . He denies polyphagia, polyuria or polydipsia. He is compliant, up to date with eye exam, he is due for urine micro  No side effects of medications, he is on insulin.  He is still taking [place], he is using 74 units per day, and no recent hypoglycemic episodes. Last A1C was 6.5% today down to 6.2 %, explained that since he is 85 yo it is safer to keep his fasting glucose above 100 . He is seeing Dr. Honor Junes twice a year. Also discussed going down on HCTZ because bp towards low end of normal but he wants to continue current regiment    HTN: he denies chest pain, palpitation or dizziness He has good exercise tolerance, he likes to be outside in his yard   Atherosclerosis of aorta: normal distal pulses, found on CT scan done 07/2018, no side effects of Rosuvastatin , we will recheck labs today    GERD: had EGD done recently by Dr. Fleet Contras and no esophageal varices seen on EGD this time around, he has gastritis but controlled with PPI, discussed high risk use of PPI    Hyperlipidemia and aorta atherosclerosis: no myalgia,we will recheck labs today    Seborrhea: under the care of Dr. Nehemiah Massed, unchanged    Lower extremity edema he uses compression stocking hoses and it is controlling symptoms for him , foot ulcer resolved  Carpal tunnel syndrome: right wrist only, had surgery done by Dr. Sabra Heck about 10 years ago and has noticed tingling on middle fingers and sometimes right thumb, aggravated by driving or gardening, states symptoms are intermittent. He states starting to have symptoms again and will follow up with  him prn  Patient Active Problem List   Diagnosis Date Noted   Skin ulcer of left lower leg ([place]) 05/01/2020   Bicipital tenosynovitis 11/16/2018   Impingement syndrome of shoulder region 11/16/2018   Atherosclerosis of aorta ([place]) 07/21/2018   History of diverticulitis of colon 11/15/2017   Hyperlipidemia due to type 2 diabetes mellitus ([place]) 03/14/2017   Bilateral lower extremity edema 03/27/2016   Strain of hamstring muscle 02/21/2016   Vitamin D deficiency 12/05/2015   Incomplete emptying of bladder 10/11/2015   Hx of transient ischemic attack (TIA) 09/17/2015   Transient vision disturbance of both eyes 08/28/2015   Stroke-like symptoms 08/28/2015   Diabetes mellitus type 2 without retinopathy ([place]) 05/31/2015   Hyperlipidemia 05/30/2015   Abnormal CT of the chest 05/15/2015   Abnormal abdominal CT scan 05/15/2015   Chronic kidney disease (CKD), stage I 01/08/2015   Type II diabetes mellitus with renal manifestations, uncontrolled 01/08/2015   Type 2 diabetes, uncontrolled, with neuropathy 01/08/2015   Obesity 01/08/2015   Proteinuria 01/08/2015   Atrophy of vocal cord 02/07/2014   Incisional hernia, without obstruction or gangrene 03/23/2013   Enlarged prostate with lower urinary tract symptoms (LUTS) 11/03/2012   Myopic degeneration, bilateral 08/12/2012   Nonexudative age-related macular degeneration 07/15/2012   Anisometropia 04/08/2012   Pseudoaphakia 04/08/2012   History of retinal detachment 04/08/2012   Presence of intraocular lens 04/08/2012  Bladder diverticulum 02/12/2012   Diverticulum of bladder 02/12/2012   Dysphonia 02/08/2012   Secondary open-angle glaucoma of left eye, severe stage 01/13/2012   Osteopenia 03/18/2010   Gastro-esophageal reflux disease with esophagitis 02/07/2008   Benign essential HTN 02/03/2007   Hypertension associated with diabetes ([place]) 02/03/2007    Past Surgical History:  Procedure Laterality Date   APPENDECTOMY     BLADDER  DIVERTICULECTOMY  2013   COLON SURGERY Left 1980's   COLONOSCOPY W/ POLYPECTOMY  1990's   Dr Sharlet Salina   COLONOSCOPY WITH PROPOFOL N/A 12/21/2014   Procedure: COLONOSCOPY WITH PROPOFOL;  Surgeon: Lollie Sails, MD;  Location: [facility] ENDOSCOPY;  Service: Endoscopy;  Laterality: N/A;   COLONOSCOPY WITH PROPOFOL N/A 08/16/2017   Procedure: COLONOSCOPY WITH PROPOFOL;  Surgeon: Lollie Sails, MD;  Location: [facility] ENDOSCOPY;  Service: Endoscopy;  Laterality: N/A;   COLONOSCOPY WITH PROPOFOL N/A 01/31/2021   Procedure: COLONOSCOPY WITH PROPOFOL;  Surgeon: Robert Bellow, MD;  Location: [facility] ENDOSCOPY;  Service: Endoscopy;  Laterality: N/A;   ESOPHAGOGASTRODUODENOSCOPY (EGD) WITH PROPOFOL N/A 02/15/2015   Procedure: ESOPHAGOGASTRODUODENOSCOPY (EGD) WITH PROPOFOL;  Surgeon: Lollie Sails, MD;  Location: [facility] ENDOSCOPY;  Service: Endoscopy;  Laterality: N/A;   ESOPHAGOGASTRODUODENOSCOPY (EGD) WITH PROPOFOL N/A 06/10/2017   Procedure: ESOPHAGOGASTRODUODENOSCOPY (EGD) WITH PROPOFOL;  Surgeon: Lollie Sails, MD;  Location: [facility] ENDOSCOPY;  Service: Endoscopy;  Laterality: N/A;   ESOPHAGOGASTRODUODENOSCOPY (EGD) WITH PROPOFOL N/A 08/16/2017   Procedure: ESOPHAGOGASTRODUODENOSCOPY (EGD) WITH PROPOFOL;  Surgeon: Lollie Sails, MD;  Location: [facility] ENDOSCOPY;  Service: Endoscopy;  Laterality: N/A;   ESOPHAGOGASTRODUODENOSCOPY (EGD) WITH PROPOFOL N/A 01/31/2021   Procedure: ESOPHAGOGASTRODUODENOSCOPY (EGD) WITH PROPOFOL;  Surgeon: Robert Bellow, MD;  Location: [facility] ENDOSCOPY;  Service: Endoscopy;  Laterality: N/A;   EYE SURGERY     GLAUCOMA SURGERY Left 26's   [facility]   HEMORROIDECTOMY  [phone]   [facility]   HERNIA REPAIR     [facility], Dr Alfredia Client HERNIA REPAIR Bilateral [phone]   LARYNX SURGERY  1990   3 times   RETINAL DETACHMENT SURGERY Right    TONSILLECTOMY     turp  2013   URETER SURGERY  2013   Dr Jacqlyn Larsen    Family History  Problem Relation Age of Onset    Diabetes Mother    Cataracts Mother    Metabolic syndrome Mother    Blindness Mother        r/t diabetes   Cataracts Father    Glaucoma Father        Retina-Detachment   Atrial fibrillation Father    Diverticulitis Sister    Glaucoma Brother    Diabetes Brother    Cancer Sister        Gallbladder   Cancer Daughter     Social History   Tobacco Use   Smoking status: Never   Smokeless tobacco: Never   Tobacco comments:    smoking cessation materials not required  Substance Use Topics   Alcohol use: No     Current Outpatient Medications:    bisoprolol (ZEBETA) 10 MG tablet, Take 1 tablet by mouth once daily, Disp: 90 tablet, Rfl: 0   brimonidine (ALPHAGAN P) 0.1 % SOLN, Place 1 drop into the left eye 2 (two) times daily. , Disp: , Rfl:    desonide (DESOWEN) 0.05 % ointment, Apply to rash under eye QD-BID PRN, Disp: 15 g, Rfl: 1   dorzolamide (TRUSOPT) 2 % ophthalmic solution, Place 1 drop into the left  eye 2 (two) times daily., Disp: , Rfl: 3   Exenatide ER (BYDUREON BCISE) 2 MG/0.85ML AUIJ, Inject into the skin., Disp: , Rfl:    finasteride (PROSCAR) 5 MG tablet, Take 1 tablet by mouth once daily, Disp: 90 tablet, Rfl: 0   hydrocortisone 2.5 % cream, APPLY TO THE AFFECTED AREAS OF FACE AND EARS EVERY OTHER NIGHT. ALTERNATING WITH KETOCONAZOLE CREAM, Disp: 28 g, Rfl: 0   insulin degludec (TRESIBA FLEXTOUCH) 200 UNIT/ML FlexTouch Pen, Inject 74 Units into the skin in the morning., Disp: , Rfl:    INVOKANA 300 MG TABS tablet, TAKE ONE TABLET BY MOUTH ONCE DAILY, Disp: 90 tablet, Rfl: 0   ketoconazole (NIZORAL) 2 % cream, Apply to aa's face and ears every other night alternating with Hydrocortisone cream., Disp: 60 g, Rfl: 11   ketoconazole (NIZORAL) 2 % shampoo, Apply 1 application topically 3 (three) times a week. Shampoo scalp 3 days a week let sit 5 minutes and rinse off, Disp: 120 mL, Rfl: 11   metFORMIN (GLUCOPHAGE) 1000 MG tablet, Take 1 tablet (1,000 mg total) by mouth 2  (two) times daily., Disp: 180 tablet, Rfl: 1   mometasone (ELOCON) 0.1 % lotion, APPLY TO THE AFFECTED AREAS OF SCALP THREE NIGHTS PER WEEK, Disp: 60 mL, Rfl: 0   naproxen sodium (ALEVE) 220 MG tablet, Take 1 tablet (220 mg total) by mouth daily as needed., Disp: 30 tablet, Rfl: 0   omega-3 acid ethyl esters (LOVAZA) 1 g capsule, Take 2 capsules by mouth twice daily, Disp: 240 capsule, Rfl: 0   omeprazole (PRILOSEC) 20 MG capsule, Take 1 capsule by mouth once daily, Disp: 90 capsule, Rfl: 0   rosuvastatin (CRESTOR) 10 MG tablet, TAKE 1 TABLET BY MOUTH AT BEDTIME, Disp: 90 tablet, Rfl: 0   tadalafil (CIALIS) 20 MG tablet, Take 0.5-1 tablets (10-20 mg total) by mouth every other day as needed for erectile dysfunction., Disp: 30 tablet, Rfl: 0   telmisartan-hydrochlorothiazide (MICARDIS HCT) 80-25 MG tablet, Take 1 tablet by mouth once daily, Disp: 90 tablet, Rfl: 0   timolol (TIMOPTIC) 0.5 % ophthalmic solution, INSTILL 1 DROP INTO LEFT EYE TWICE DAILY, Disp: , Rfl:   Allergies  Allergen Reactions   Demerol [Meperidine] Other (See Comments) and Anaphylaxis    MAKES BLOOD PRESSURE BOTTOM OUT hypotension   Dilaudid [Hydromorphone Hcl]    Hydromorphone     Other reaction(s): Unknown MAKES BLOOD PRESSURE BOTTOM OUT   Meperidine Hcl    Actos [Pioglitazone] Other (See Comments) and Hives   Dapagliflozin Rash   Penicillins Rash    I personally reviewed active problem list, medication list, allergies, family history, social history, health maintenance with the patient/caregiver today.   ROS  Constitutional: Negative for fever or weight change.  Respiratory: Negative for cough and shortness of breath.   Cardiovascular: Negative for chest pain or palpitations.  Gastrointestinal: Negative for abdominal pain, no bowel changes.  Musculoskeletal: Negative for gait problem or joint swelling.  Skin: Negative for rash.  Neurological: Negative for dizziness or headache.  No other specific complaints  in a complete review of systems (except as listed in HPI above).   Objective  Vitals:   05/05/21 1323  BP: 112/78  Pulse: 80  Resp: 16  Temp: 97.6 F (36.4 C)  SpO2: 94%  Weight: 253 lb (114.8 kg)  Height: 6\' 4"  (1.93 m)    Body mass index is 30.8 kg/m.  Physical Exam  Constitutional: Patient appears well-developed and well-nourished. Obese  No distress.  HEENT: head atraumatic, normocephalic, pupils equal and reactive to light,neck supple Cardiovascular: Normal rate, regular rhythm and normal heart sounds.  No murmur heard. Trace BLE edema. Pulmonary/Chest: Effort normal and breath sounds normal. No respiratory distress. Abdominal: Soft.  There is no tenderness. Psychiatric: Patient has a normal mood and affect. behavior is normal. Judgment and thought content normal.   Recent Results (from the past 2160 hour(s))  Hemoglobin A1c     Status: None   Collection Time: 02/06/21 12:00 AM  Result Value Ref Range   Hemoglobin A1C 6.3      PHQ2/9: Depression screen [facility] 2/9 05/05/2021 03/04/2021 10/29/2020 05/01/2020 02/29/2020  Decreased Interest 0 0 0 0 0  Down, Depressed, Hopeless 0 0 0 0 0  PHQ - 2 Score 0 0 0 0 0  Altered sleeping 0 - - - -  Tired, decreased energy 0 - - - -  Change in appetite 0 - - - -  Feeling bad or failure about yourself  0 - - - -  Trouble concentrating 0 - - - -  Moving slowly or fidgety/restless 0 - - - -  Suicidal thoughts 0 - - - -  PHQ-9 Score 0 - - - -  Difficult doing work/chores - - - - -  Some recent data might be hidden    phq 9 is negative   Fall Risk: Fall Risk  05/05/2021 03/04/2021 10/29/2020 05/01/2020 02/29/2020  Falls in the past year? 0 0 0 0 0  Number falls in past yr: 0 0 0 0 0  Injury with Fall? 0 0 0 0 0  Risk for fall due to : No Fall Risks No Fall Risks - - No Fall Risks  Risk for fall due to: Comment - - - - -  Follow up Falls prevention discussed Falls prevention discussed - - Falls prevention discussed       Functional Status Survey: Is the patient deaf or have difficulty hearing?: No Does the patient have difficulty seeing, even when wearing glasses/contacts?: No Does the patient have difficulty concentrating, remembering, or making decisions?: No Does the patient have difficulty walking or climbing stairs?: No Does the patient have difficulty dressing or bathing?: No Does the patient have difficulty doing errands alone such as visiting a doctor's office or shopping?: No    Assessment & Plan  1. Dyslipidemia associated with type 2 diabetes mellitus (HCC)  - Lipid panel - omega-3 acid ethyl esters (LOVAZA) 1 g capsule; Take 2 capsules (2 g total) by mouth 2 (two) times daily.  Dispense: 480 capsule; Refill: 3  2. Atherosclerosis of aorta (HCC)  - Lipid panel  3. Type 2 diabetes mellitus with microalbuminuria, with long-term current use of insulin (HCC)  - Microalbumin / creatinine urine ratio - telmisartan-hydrochlorothiazide (MICARDIS HCT) 80-25 MG tablet; Take 1 tablet by mouth daily.  Dispense: 90 tablet; Refill: 1  4. Hypertension associated with diabetes ([place])  - telmisartan-hydrochlorothiazide (MICARDIS HCT) 80-25 MG tablet; Take 1 tablet by mouth daily.  Dispense: 90 tablet; Refill: 1  5. Benign essential HTN  - CBC with Differential/Platelet - bisoprolol (ZEBETA) 10 MG tablet; Take 1 tablet (10 mg total) by mouth daily.  Dispense: 90 tablet; Refill: 1 - telmisartan-hydrochlorothiazide (MICARDIS HCT) 80-25 MG tablet; Take 1 tablet by mouth daily.  Dispense: 90 tablet; Refill: 1  6. Mixed hyperlipidemia  - omega-3 acid ethyl esters (LOVAZA) 1 g capsule; Take 2 capsules (2 g total) by mouth 2 (two) times daily.  Dispense: 480  capsule; Refill: 3  7. Carpal tunnel syndrome of right wrist   8. Bilateral lower extremity edema  Stable on compression stocking hoses  9. Benign prostatic hyperplasia without lower urinary tract symptoms  - finasteride (PROSCAR) 5 MG  tablet; Take 1 tablet (5 mg total) by mouth daily.  Dispense: 90 tablet; Refill: 3  10. Chronic superficial gastritis without bleeding

## 2021-05-04 ENCOUNTER — Other Ambulatory Visit: Payer: Self-pay | Admitting: Family Medicine

## 2021-05-04 DIAGNOSIS — N400 Enlarged prostate without lower urinary tract symptoms: Secondary | ICD-10-CM

## 2021-05-04 NOTE — Telephone Encounter (Signed)
Requested medication (s) are due for refill today: yes  Requested medication (s) are on the active medication list: yes  Last refill:  11/11/20 #90 1 RF  Future visit scheduled: yes   Notes to clinic:  pt has appt tomorrow- please assess   Requested Prescriptions  Pending Prescriptions Disp Refills   finasteride (PROSCAR) 5 MG tablet [Pharmacy Med Name: Finasteride 5 MG Oral Tablet] 90 tablet 0    Sig: Take 1 tablet by mouth once daily     Urology: 5-alpha Reductase Inhibitors Passed - 05/04/2021  6:31 AM      Passed - Valid encounter within last 12 months    Recent Outpatient Visits           6 months ago Type 2 diabetes mellitus with microalbuminuria, with long-term current use of insulin [facility])   [facility] Steele Sizer, MD   1 year ago Hypertension associated with diabetes [facility])   [facility] Steele Sizer, MD   1 year ago Productive cough   [facility] Steele Sizer, MD   1 year ago Mixed hyperlipidemia   [facility] [place], Drue Stager, MD   2 years ago Skin ulcer of left lower leg, unspecified ulcer stage [facility])   [facility] Steele Sizer, MD       Future Appointments             Tomorrow Steele Sizer, MD [facility] [place]   In 10 months  [place]

## 2021-05-05 ENCOUNTER — Encounter: Payer: Self-pay | Admitting: Family Medicine

## 2021-05-05 ENCOUNTER — Ambulatory Visit (INDEPENDENT_AMBULATORY_CARE_PROVIDER_SITE_OTHER): Payer: Medicare Other | Admitting: Family Medicine

## 2021-05-05 ENCOUNTER — Other Ambulatory Visit: Payer: Self-pay

## 2021-05-05 VITALS — BP 112/78 | HR 80 | Temp 97.6°F | Resp 16 | Ht 76.0 in | Wt 253.0 lb

## 2021-05-05 DIAGNOSIS — K2930 Chronic superficial gastritis without bleeding: Secondary | ICD-10-CM | POA: Diagnosis not present

## 2021-05-05 DIAGNOSIS — R809 Proteinuria, unspecified: Secondary | ICD-10-CM | POA: Diagnosis not present

## 2021-05-05 DIAGNOSIS — I152 Hypertension secondary to endocrine disorders: Secondary | ICD-10-CM

## 2021-05-05 DIAGNOSIS — N400 Enlarged prostate without lower urinary tract symptoms: Secondary | ICD-10-CM | POA: Diagnosis not present

## 2021-05-05 DIAGNOSIS — I10 Essential (primary) hypertension: Secondary | ICD-10-CM | POA: Diagnosis not present

## 2021-05-05 DIAGNOSIS — E1129 Type 2 diabetes mellitus with other diabetic kidney complication: Secondary | ICD-10-CM | POA: Diagnosis not present

## 2021-05-05 DIAGNOSIS — D696 Thrombocytopenia, unspecified: Secondary | ICD-10-CM | POA: Diagnosis not present

## 2021-05-05 DIAGNOSIS — E1169 Type 2 diabetes mellitus with other specified complication: Secondary | ICD-10-CM | POA: Diagnosis not present

## 2021-05-05 DIAGNOSIS — E782 Mixed hyperlipidemia: Secondary | ICD-10-CM

## 2021-05-05 DIAGNOSIS — Z794 Long term (current) use of insulin: Secondary | ICD-10-CM | POA: Diagnosis not present

## 2021-05-05 DIAGNOSIS — I700 Atherosclerosis of aorta: Secondary | ICD-10-CM | POA: Diagnosis not present

## 2021-05-05 DIAGNOSIS — E785 Hyperlipidemia, unspecified: Secondary | ICD-10-CM

## 2021-05-05 DIAGNOSIS — E1159 Type 2 diabetes mellitus with other circulatory complications: Secondary | ICD-10-CM | POA: Diagnosis not present

## 2021-05-05 DIAGNOSIS — R600 Localized edema: Secondary | ICD-10-CM | POA: Diagnosis not present

## 2021-05-05 DIAGNOSIS — G5601 Carpal tunnel syndrome, right upper limb: Secondary | ICD-10-CM | POA: Diagnosis not present

## 2021-05-05 MED ORDER — BISOPROLOL FUMARATE 10 MG PO TABS: 10.0000 mg | ORAL_TABLET | Freq: Every day | ORAL | 1 refills | Status: DC

## 2021-05-05 MED ORDER — FINASTERIDE 5 MG PO TABS: 5.0000 mg | ORAL_TABLET | Freq: Every day | ORAL | 3 refills | Status: DC

## 2021-05-05 MED ORDER — OMEGA-3-ACID ETHYL ESTERS 1 G PO CAPS: 2.0000 | ORAL_CAPSULE | Freq: Two times a day (BID) | ORAL | 3 refills | Status: DC

## 2021-05-05 MED ORDER — OMEPRAZOLE 20 MG PO CPDR: 20.0000 mg | DELAYED_RELEASE_CAPSULE | Freq: Every day | ORAL | 3 refills | Status: DC

## 2021-05-05 MED ORDER — TELMISARTAN-HCTZ 80-25 MG PO TABS: 1.0000 | ORAL_TABLET | Freq: Every day | ORAL | 1 refills | Status: DC

## 2021-05-07 ENCOUNTER — Other Ambulatory Visit: Payer: Self-pay

## 2021-05-07 DIAGNOSIS — E782 Mixed hyperlipidemia: Secondary | ICD-10-CM

## 2021-05-07 DIAGNOSIS — E1129 Type 2 diabetes mellitus with other diabetic kidney complication: Secondary | ICD-10-CM

## 2021-05-07 DIAGNOSIS — R718 Other abnormality of red blood cells: Secondary | ICD-10-CM

## 2021-05-07 DIAGNOSIS — E538 Deficiency of other specified B group vitamins: Secondary | ICD-10-CM

## 2021-05-08 LAB — CBC WITH DIFFERENTIAL/PLATELET
Absolute Monocytes: 547 cells/uL (ref 200–950)
Basophils Absolute: 32 cells/uL (ref 0–200)
Basophils Relative: 0.7 %
Eosinophils Absolute: 152 cells/uL (ref 15–500)
Eosinophils Relative: 3.3 %
HCT: 44.3 % (ref 38.5–50.0)
Hemoglobin: 15 g/dL (ref 13.2–17.1)
Lymphs Abs: 1394 cells/uL (ref 850–3900)
MCH: 34.3 pg — ABNORMAL HIGH (ref 27.0–33.0)
MCHC: 33.9 g/dL (ref 32.0–36.0)
MCV: 101.4 fL — ABNORMAL HIGH (ref 80.0–100.0)
MPV: 11.1 fL (ref 7.5–12.5)
Monocytes Relative: 11.9 %
Neutro Abs: 2475 cells/uL (ref 1500–7800)
Neutrophils Relative %: 53.8 %
Platelets: 147 10*3/uL (ref 140–400)
RBC: 4.37 10*6/uL (ref 4.20–5.80)
RDW: 12 % (ref 11.0–15.0)
Total Lymphocyte: 30.3 %
WBC: 4.6 10*3/uL (ref 3.8–10.8)

## 2021-05-08 LAB — COMPLETE METABOLIC PANEL WITH GFR
AG Ratio: 2 (calc) (ref 1.0–2.5)
ALT: 15 U/L (ref 9–46)
AST: 15 U/L (ref 10–35)
Albumin: 4.3 g/dL (ref 3.6–5.1)
Alkaline phosphatase (APISO): 35 U/L (ref 35–144)
BUN: 22 mg/dL (ref 7–25)
CO2: 32 mmol/L (ref 20–32)
Calcium: 9.3 mg/dL (ref 8.6–10.3)
Chloride: 104 mmol/L (ref 98–110)
Creat: 0.74 mg/dL (ref 0.70–1.22)
Globulin: 2.1 g/dL (calc) (ref 1.9–3.7)
Glucose, Bld: 94 mg/dL (ref 65–99)
Potassium: 4.4 mmol/L (ref 3.5–5.3)
Sodium: 142 mmol/L (ref 135–146)
Total Bilirubin: 0.8 mg/dL (ref 0.2–1.2)
Total Protein: 6.4 g/dL (ref 6.1–8.1)
eGFR: 90 mL/min/{1.73_m2} (ref >=60)

## 2021-05-08 LAB — TEST AUTHORIZATION

## 2021-05-08 LAB — B12 AND FOLATE PANEL
Folate: >24 ng/mL
Vitamin B-12: >2000 pg/mL — ABNORMAL HIGH (ref 200–1100)

## 2021-05-08 LAB — LIPID PANEL
Cholesterol: 96 mg/dL (ref <200)
HDL: 34 mg/dL — ABNORMAL LOW (ref >=40)
LDL Cholesterol (Calc): 33 mg/dL (calc)
Non-HDL Cholesterol (Calc): 62 mg/dL (calc) (ref <130)
Total CHOL/HDL Ratio: 2.8 (calc) (ref <5.0)
Triglycerides: 217 mg/dL — ABNORMAL HIGH (ref <150)

## 2021-05-08 LAB — MICROALBUMIN / CREATININE URINE RATIO
Creatinine, Urine: 65 mg/dL (ref 20–320)
Microalb Creat Ratio: 25 mcg/mg creat (ref <30)
Microalb, Ur: 1.6 mg/dL

## 2021-06-01 ENCOUNTER — Other Ambulatory Visit: Payer: Self-pay | Admitting: Family Medicine

## 2021-06-01 DIAGNOSIS — K2930 Chronic superficial gastritis without bleeding: Secondary | ICD-10-CM

## 2021-06-04 DIAGNOSIS — B351 Tinea unguium: Secondary | ICD-10-CM | POA: Diagnosis not present

## 2021-06-04 DIAGNOSIS — E1142 Type 2 diabetes mellitus with diabetic polyneuropathy: Secondary | ICD-10-CM | POA: Diagnosis not present

## 2021-07-20 ENCOUNTER — Other Ambulatory Visit: Payer: Self-pay | Admitting: Family Medicine

## 2021-07-20 DIAGNOSIS — E782 Mixed hyperlipidemia: Secondary | ICD-10-CM

## 2021-07-20 DIAGNOSIS — E1169 Type 2 diabetes mellitus with other specified complication: Secondary | ICD-10-CM

## 2021-07-23 DIAGNOSIS — E1169 Type 2 diabetes mellitus with other specified complication: Secondary | ICD-10-CM | POA: Diagnosis not present

## 2021-07-23 DIAGNOSIS — R0602 Shortness of breath: Secondary | ICD-10-CM | POA: Diagnosis not present

## 2021-07-23 DIAGNOSIS — E1142 Type 2 diabetes mellitus with diabetic polyneuropathy: Secondary | ICD-10-CM | POA: Diagnosis not present

## 2021-07-23 DIAGNOSIS — E1159 Type 2 diabetes mellitus with other circulatory complications: Secondary | ICD-10-CM | POA: Diagnosis not present

## 2021-07-23 DIAGNOSIS — Z794 Long term (current) use of insulin: Secondary | ICD-10-CM | POA: Diagnosis not present

## 2021-07-23 DIAGNOSIS — I700 Atherosclerosis of aorta: Secondary | ICD-10-CM | POA: Diagnosis not present

## 2021-07-23 DIAGNOSIS — I152 Hypertension secondary to endocrine disorders: Secondary | ICD-10-CM | POA: Diagnosis not present

## 2021-07-23 DIAGNOSIS — E785 Hyperlipidemia, unspecified: Secondary | ICD-10-CM | POA: Diagnosis not present

## 2021-07-28 ENCOUNTER — Telehealth: Payer: Self-pay | Admitting: Family Medicine

## 2021-07-28 NOTE — Telephone Encounter (Signed)
Attempted to reach out to [facility] to see when it would be in stock but they did not answer.

## 2021-07-28 NOTE — Telephone Encounter (Signed)
Pt stated he is out of his medication rosuvastatin (CRESTOR) 10 MG tablet. Pt stated the pharmacy advised him the medication is back ordered and pt is requesting for PCP to prescribe another form of Statin.  Pt stated he took his last pill yesterday.    [facility] [address], [place] - [place]  [place] [address]  Phone: [phone] Fax: [phone]  Hours: Not open 24 hours

## 2021-07-28 NOTE — Telephone Encounter (Signed)
Medication is on back order at pharmacy- is there an alternative for him?

## 2021-07-29 ENCOUNTER — Other Ambulatory Visit: Payer: Self-pay

## 2021-07-29 ENCOUNTER — Other Ambulatory Visit: Payer: Self-pay | Admitting: Family Medicine

## 2021-07-29 DIAGNOSIS — E782 Mixed hyperlipidemia: Secondary | ICD-10-CM

## 2021-07-29 DIAGNOSIS — E1169 Type 2 diabetes mellitus with other specified complication: Secondary | ICD-10-CM

## 2021-07-29 DIAGNOSIS — E785 Hyperlipidemia, unspecified: Secondary | ICD-10-CM

## 2021-07-29 MED ORDER — ROSUVASTATIN CALCIUM 10 MG PO TABS
10.0000 mg | ORAL_TABLET | Freq: Every day | ORAL | 0 refills | Status: DC
  Filled 2021-07-29 (×2): qty 90, 90d supply, fill #0

## 2021-08-14 DIAGNOSIS — E1169 Type 2 diabetes mellitus with other specified complication: Secondary | ICD-10-CM | POA: Diagnosis not present

## 2021-08-14 DIAGNOSIS — E1159 Type 2 diabetes mellitus with other circulatory complications: Secondary | ICD-10-CM | POA: Diagnosis not present

## 2021-08-14 DIAGNOSIS — E1142 Type 2 diabetes mellitus with diabetic polyneuropathy: Secondary | ICD-10-CM | POA: Diagnosis not present

## 2021-08-14 DIAGNOSIS — E785 Hyperlipidemia, unspecified: Secondary | ICD-10-CM | POA: Diagnosis not present

## 2021-08-14 DIAGNOSIS — Z794 Long term (current) use of insulin: Secondary | ICD-10-CM | POA: Diagnosis not present

## 2021-08-14 DIAGNOSIS — I152 Hypertension secondary to endocrine disorders: Secondary | ICD-10-CM | POA: Diagnosis not present

## 2021-08-14 LAB — HEMOGLOBIN A1C: Hemoglobin A1C: 6.4

## 2021-09-11 DIAGNOSIS — E1142 Type 2 diabetes mellitus with diabetic polyneuropathy: Secondary | ICD-10-CM | POA: Diagnosis not present

## 2021-09-11 DIAGNOSIS — B351 Tinea unguium: Secondary | ICD-10-CM | POA: Diagnosis not present

## 2021-09-11 LAB — HM DIABETES FOOT EXAM

## 2021-10-09 ENCOUNTER — Ambulatory Visit (INDEPENDENT_AMBULATORY_CARE_PROVIDER_SITE_OTHER): Payer: Medicare Other | Admitting: Dermatology

## 2021-10-09 DIAGNOSIS — L814 Other melanin hyperpigmentation: Secondary | ICD-10-CM

## 2021-10-09 DIAGNOSIS — L720 Epidermal cyst: Secondary | ICD-10-CM

## 2021-10-09 DIAGNOSIS — L821 Other seborrheic keratosis: Secondary | ICD-10-CM | POA: Diagnosis not present

## 2021-10-09 DIAGNOSIS — D229 Melanocytic nevi, unspecified: Secondary | ICD-10-CM | POA: Diagnosis not present

## 2021-10-09 DIAGNOSIS — L817 Pigmented purpuric dermatosis: Secondary | ICD-10-CM | POA: Diagnosis not present

## 2021-10-09 DIAGNOSIS — Z1283 Encounter for screening for malignant neoplasm of skin: Secondary | ICD-10-CM

## 2021-10-09 DIAGNOSIS — D692 Other nonthrombocytopenic purpura: Secondary | ICD-10-CM | POA: Diagnosis not present

## 2021-10-09 DIAGNOSIS — C44319 Basal cell carcinoma of skin of other parts of face: Secondary | ICD-10-CM | POA: Diagnosis not present

## 2021-10-09 DIAGNOSIS — L820 Inflamed seborrheic keratosis: Secondary | ICD-10-CM | POA: Diagnosis not present

## 2021-10-09 DIAGNOSIS — L219 Seborrheic dermatitis, unspecified: Secondary | ICD-10-CM

## 2021-10-09 DIAGNOSIS — D489 Neoplasm of uncertain behavior, unspecified: Secondary | ICD-10-CM

## 2021-10-09 DIAGNOSIS — I872 Venous insufficiency (chronic) (peripheral): Secondary | ICD-10-CM | POA: Diagnosis not present

## 2021-10-09 DIAGNOSIS — L570 Actinic keratosis: Secondary | ICD-10-CM | POA: Diagnosis not present

## 2021-10-09 DIAGNOSIS — D1800 Hemangioma unspecified site: Secondary | ICD-10-CM | POA: Diagnosis not present

## 2021-10-09 DIAGNOSIS — C4491 Basal cell carcinoma of skin, unspecified: Secondary | ICD-10-CM

## 2021-10-09 HISTORY — DX: Basal cell carcinoma of skin, unspecified: C44.91

## 2021-10-09 NOTE — Patient Instructions (Addendum)
Biopsy Wound Care Instructions ? ?Leave the original bandage on for 24 hours if possible.  If the bandage becomes soaked or soiled before that time, it is OK to remove it and examine the wound.  A small amount of post-operative bleeding is normal.  If excessive bleeding occurs, remove the bandage, place gauze over the site and apply continuous pressure (no peeking) over the area for 30 minutes. If this does not work, please call our clinic as soon as possible or page your doctor if it is after hours.  ? ?Once a day, cleanse the wound with soap and water. It is fine to shower. If a thick crust develops you may use a Q-tip dipped into dilute hydrogen peroxide (mix 1:1 with water) to dissolve it.  Hydrogen peroxide can slow the healing process, so use it only as needed.   ? ?After washing, apply petroleum jelly (Vaseline) or an antibiotic ointment if your doctor prescribed one for you, followed by a bandage.   ? ?For best healing, the wound should be covered with a layer of ointment at all times. If you are not able to keep the area covered with a bandage to hold the ointment in place, this may mean re-applying the ointment several times a day.  Continue this wound care until the wound has healed and is no longer open.  ? ?Itching and mild discomfort is normal during the healing process. However, if you develop pain or severe itching, please call our office.  ? ?If you have any discomfort, you can take Tylenol (acetaminophen) or ibuprofen as directed on the bottle. (Please do not take these if you have an allergy to them or cannot take them for another reason). ? ?Some redness, tenderness and white or yellow material in the wound is normal healing.  If the area becomes very sore and red, or develops a thick yellow-green material (pus), it may be infected; please notify us.   ? ?If you have stitches, return to clinic as directed to have the stitches removed. You will continue wound care for 2-3 days after the stitches  are removed.  ? ?Wound healing continues for up to one year following surgery. It is not unusual to experience pain in the scar from time to time during the interval.  If the pain becomes severe or the scar thickens, you should notify the office.   ? ?A slight amount of redness in a scar is expected for the first six months.  After six months, the redness will fade and the scar will soften and fade.  The color difference becomes less noticeable with time.  If there are any problems, return for a post-op surgery check at your earliest convenience. ? ?To improve the appearance of the scar, you can use silicone scar gel, cream, or sheets (such as Mederma or Serica) every night for up to one year. These are available over the counter (without a prescription). ? ?Please call our office at [phone] for any questions or concerns. ? ? ? ? ? ? ? ?Actinic keratoses are precancerous spots that appear secondary to cumulative UV radiation exposure/sun exposure over time. They are chronic with expected duration over 1 year. A portion of actinic keratoses will progress to squamous cell carcinoma of the skin. It is not possible to reliably predict which spots will progress to skin cancer and so treatment is recommended to prevent development of skin cancer. ? ?Recommend daily broad spectrum sunscreen SPF 30+ to sun-exposed areas, reapply every 2  hours as needed.  ?Recommend staying in the shade or wearing long sleeves, sun glasses (UVA+UVB protection) and wide brim hats (4-inch brim around the entire circumference of the hat). ?Call for new or changing lesions.  ? ?Cryotherapy Aftercare ? ?Wash gently with soap and water everyday.   ?Apply Vaseline and Band-Aid daily until healed.  ? ? ? ?Seborrheic Keratosis ? ?What causes seborrheic keratoses? ?Seborrheic keratoses are harmless, common skin growths that first appear during adult life.  As time goes by, more growths appear.  Some people may develop a large number of them.   Seborrheic keratoses appear on both covered and uncovered body parts.  They are not caused by sunlight.  The tendency to develop seborrheic keratoses can be inherited.  They vary in color from skin-colored to gray, brown, or even black.  They can be either smooth or have a rough, warty surface.   ?Seborrheic keratoses are superficial and look as if they were stuck on the skin.  Under the microscope this type of keratosis looks like layers upon layers of skin.  That is why at times the top layer may seem to fall off, but the rest of the growth remains and re-grows.   ? ?Treatment ?Seborrheic keratoses do not need to be treated, but can easily be removed in the office.  Seborrheic keratoses often cause symptoms when they rub on clothing or jewelry.  Lesions can be in the way of shaving.  If they become inflamed, they can cause itching, soreness, or burning.  Removal of a seborrheic keratosis can be accomplished by freezing, burning, or surgery. ?If any spot bleeds, scabs, or grows rapidly, please return to have it checked, as these can be an indication of a skin cancer. ? ? ? ? ? ? ?Melanoma ABCDEs ? ?Melanoma is the most dangerous type of skin cancer, and is the leading cause of death from skin disease.  You are more likely to develop melanoma if you: ?Have light-colored skin, light-colored eyes, or red or blond hair ?Spend a lot of time in the sun ?Tan regularly, either outdoors or in a tanning bed ?Have had blistering sunburns, especially during childhood ?Have a close family member who has had a melanoma ?Have atypical moles or large birthmarks ? ?Early detection of melanoma is key since treatment is typically straightforward and cure rates are extremely high if we catch it early.  ? ?The first sign of melanoma is often a change in a mole or a new dark spot.  The ABCDE system is a way of remembering the signs of melanoma. ? ?A for asymmetry:  The two halves do not match. ?B for border:  The edges of the growth are  irregular. ?C for color:  A mixture of colors are present instead of an even brown color. ?D for diameter:  Melanomas are usually (but not always) greater than 24m - the size of a pencil eraser. ?E for evolution:  The spot keeps changing in size, shape, and color. ? ?Please check your skin once per month between visits. You can use a small mirror in front and a large mirror behind you to keep an eye on the back side or your body.  ? ?If you see any new or changing lesions before your next follow-up, please call to schedule a visit. ? ?Please continue daily skin protection including broad spectrum sunscreen SPF 30+ to sun-exposed areas, reapplying every 2 hours as needed when you're outdoors.  ? ?Staying in the shade or  wearing long sleeves, sun glasses (UVA+UVB protection) and wide brim hats (4-inch brim around the entire circumference of the hat) are also recommended for sun protection.   ?If You Need Anything After Your Visit ? ?If you have any questions or concerns for your doctor, please call our main line at [phone] and press option 4 to reach your doctor's medical assistant. If no one answers, please leave a voicemail as directed and we will return your call as soon as possible. Messages left after 4 pm will be answered the following business day.  ? ?You may also send [place] a message via MyChart. We typically respond to MyChart messages within 1-2 business days. ? ?For prescription refills, please ask your pharmacy to contact our office. Our fax number is [phone]. ? ?If you have an urgent issue when the clinic is closed that cannot wait until the next business day, you can page your doctor at the number below.   ? ?Please note that while we do our best to be available for urgent issues outside of office hours, we are not available 24/7.  ? ?If you have an urgent issue and are unable to reach [place], you may choose to seek medical care at your doctor's office, retail clinic, urgent care center, or emergency  room. ? ?If you have a medical emergency, please immediately call 911 or go to the emergency department. ? ?Pager Numbers ? ?- Dr. Nehemiah Massed: [phone] ? ?- Dr. Laurence Ferrari: [phone] ? ?- Dr. Nicole Kindred: 3

## 2021-10-09 NOTE — Progress Notes (Signed)
? ?Follow-Up Visit ?  ?Subjective  ?Jeremy [name] is a 86 y.o. male who presents for the following: Annual Exam (1 year tbse. Hx of seb derm using ketoconazole cream and hc 2.5 % cr at face and ketoconazole shampoo and mometasone solution at scalp. Hx of aks. ). ?The patient presents for Total-Body Skin Exam (TBSE) for skin cancer screening and mole check.  The patient has spots, moles and lesions to be evaluated, some may be new or changing and the patient has concerns that these could be cancer. ?Pt has "scar" of forehead he says present for decades without change.  I have questioned him in past about my concern this may be a neoplasm, but he declined biopsy in past. ? ?The following portions of the chart were reviewed this encounter and updated as appropriate:  Tobacco  Allergies  Meds  Problems  Med Hx  Surg Hx  Fam Hx   ?  ?Review of Systems: No other skin or systemic complaints except as noted in HPI or Assessment and Plan. ? ?Objective  ?Well appearing patient in no apparent distress; mood and affect are within normal limits. ? ?A full examination was performed including scalp, head, eyes, ears, nose, lips, neck, chest, axillae, abdomen, back, buttocks, bilateral upper extremities, bilateral lower extremities, hands, feet, fingers, toes, fingernails, and toenails. All findings within normal limits unless otherwise noted below. ? ?face x 6 (6) ?Erythematous stuck-on, waxy papule or plaque ? ?forehead ?Smooth white papule(s).  ? ?right medial forehead at glabella ?1 cm hypopigmented indurated papule ? ? ? ? ? ? ?face x 1 ?Erythematous thin papules/macules with gritty scale.  ? ? ?Assessment & Plan  ?Seborrheic dermatitis ?Scalp; face ?Seborrheic Dermatitis  ?-  is a chronic persistent rash characterized by pinkness and scaling most commonly of the mid face but also can occur on the scalp (dandruff), ears; mid chest, mid back and groin.  It tends to be exacerbated by stress and cooler weather.  People  who have neurologic disease may experience new onset or exacerbation of existing seborrheic dermatitis.  The condition is not curable but treatable and can be controlled. ?Chronic and persistent condition with duration or expected duration over one year. Condition is symptomatic / bothersome to patient. Not to goal. ?Cont Ketoconazole 2% cr qohs aa face ?Cont HC 2.5% cr qohs aa face ?Cont Ketoconazole 2% shampoo 2x/wk ?Cont Mometasone lotion 2x/wk aa scalp ? ?Related Medications ?ketoconazole (NIZORAL) 2 % shampoo ?Apply 1 application topically 3 (three) times a week. Shampoo scalp 3 days a week let sit 5 minutes and rinse off ?ketoconazole (NIZORAL) 2 % cream ?Apply to aa's face and ears every other night alternating with Hydrocortisone cream. ?hydrocortisone 2.5 % cream ?APPLY TO THE AFFECTED AREAS OF FACE AND EARS EVERY OTHER NIGHT. ALTERNATING WITH KETOCONAZOLE CREAM ?mometasone (ELOCON) 0.1 % lotion ?APPLY TO THE AFFECTED AREAS OF SCALP THREE NIGHTS PER WEEK ? ?Inflamed seborrheic keratosis (6) ?face x 6 ?Destruction of lesion - face x 6 ?Complexity: simple   ?Destruction method: cryotherapy   ?Informed consent: discussed and consent obtained   ?Timeout:  patient name, date of birth, surgical site, and procedure verified ?Lesion destroyed using liquid nitrogen: Yes   ?Region frozen until ice ball extended beyond lesion: Yes   ?Outcome: patient tolerated procedure well with no complications   ?Post-procedure details: wound care instructions given   ?Additional details:  Prior to procedure, discussed risks of blister formation, small wound, skin dyspigmentation, or rare scar following cryotherapy.  Recommend Vaseline ointment to treated areas while healing. ? ?Milia ?forehead ?Benign-appearing.  Observation.  Call clinic for new or changing lesions.  Recommend daily use of broad spectrum spf 30+ sunscreen to sun-exposed areas.  ? ?Neoplasm of uncertain behavior - site of trauma many years ago - pt feels like it is  a non-changing scar.   ?Dr Nehemiah Massed concerned about possible BCC. ?right medial forehead at glabella ? ?Skin / nail biopsy ?Type of biopsy: tangential   ?Informed consent: discussed and consent obtained   ?Timeout: patient name, date of birth, surgical site, and procedure verified   ?Procedure prep:  Patient was prepped and draped in usual sterile fashion ?Prep type:  Isopropyl alcohol ?Anesthesia: the lesion was anesthetized in a standard fashion   ?Anesthetic:  1% lidocaine w/ epinephrine 1-100,000 buffered w/ 8.4% NaHCO3 ?Instrument used: flexible razor blade   ?Hemostasis achieved with: pressure, aluminum chloride and electrodesiccation   ?Outcome: patient tolerated procedure well   ?Post-procedure details: sterile dressing applied and wound care instructions given   ?Dressing type: bandage and petrolatum   ? ?Specimen 1 - Surgical pathology ?Differential Diagnosis: r/o bcc  ?D48.5 ?Check Margins: No ?Scar from previous trauma ?R/o bcc ? ?Actinic keratosis ?face x 1 ?Actinic keratoses are precancerous spots that appear secondary to cumulative UV radiation exposure/sun exposure over time. They are chronic with expected duration over 1 year. A portion of actinic keratoses will progress to squamous cell carcinoma of the skin. It is not possible to reliably predict which spots will progress to skin cancer and so treatment is recommended to prevent development of skin cancer. ? ?Recommend daily broad spectrum sunscreen SPF 30+ to sun-exposed areas, reapply every 2 hours as needed.  ?Recommend staying in the shade or wearing long sleeves, sun glasses (UVA+UVB protection) and wide brim hats (4-inch brim around the entire circumference of the hat). ?Call for new or changing lesions. ? ?Destruction of lesion - face x 1 ?Complexity: simple   ?Destruction method: cryotherapy   ?Informed consent: discussed and consent obtained   ?Timeout:  patient name, date of birth, surgical site, and procedure verified ?Lesion destroyed  using liquid nitrogen: Yes   ?Region frozen until ice ball extended beyond lesion: Yes   ?Outcome: patient tolerated procedure well with no complications   ?Post-procedure details: wound care instructions given   ?Additional details:  Prior to procedure, discussed risks of blister formation, small wound, skin dyspigmentation, or rare scar following cryotherapy. Recommend Vaseline ointment to treated areas while healing. ? ?Stasis dermatitis of both legs with Shamberg's purpura ?b/l lower legs ?Stasis in the legs causes chronic leg swelling, which may result in itchy or painful rashes, skin discoloration, skin texture changes, and sometimes ulceration.  Recommend daily graduated compression hose/stockings- easiest to put on first thing in morning, remove at bedtime.  Elevate legs as much as possible. Avoid salt/sodium rich foods. ? ?Lentigines ?- Scattered tan macules ?- Due to sun exposure ?- Benign-appearing, observe ?- Recommend daily broad spectrum sunscreen SPF 30+ to sun-exposed areas, reapply every 2 hours as needed. ?- Call for any changes ? ?Seborrheic Keratoses ?- Stuck-on, waxy, tan-brown papules and/or plaques  ?- Benign-appearing ?- Discussed benign etiology and prognosis. ?- Observe ?- Call for any changes ? ?Purpura - Chronic; persistent and recurrent.  Treatable, but not curable. ?- Violaceous macules and patches ?- Benign ?- Related to trauma, age, sun damage and/or use of blood thinners, chronic use of topical and/or oral steroids ?- Observe ?- Can use OTC arnica  containing moisturizer such as Dermend Bruise Formula if desired ?- Call for worsening or other concerns ? ?Melanocytic Nevi ?- Tan-brown and/or pink-flesh-colored symmetric macules and papules ?- Benign appearing on exam today ?- Observation ?- Call clinic for new or changing moles ?- Recommend daily use of broad spectrum spf 30+ sunscreen to sun-exposed areas.  ? ?Hemangiomas ?- Red papules ?- Discussed benign nature ?- Observe ?- Call  for any changes ? ?Actinic Damage ?- Chronic condition, secondary to cumulative UV/sun exposure ?- diffuse scaly erythematous macules with underlying dyspigmentation ?- Recommend daily broad spectrum sunscree

## 2021-10-16 ENCOUNTER — Telehealth: Payer: Self-pay

## 2021-10-16 NOTE — Telephone Encounter (Signed)
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 ? ? ?Freddi Starr at [facility] requesting a copy of pathology slide.    ? ?

## 2021-10-16 NOTE — Telephone Encounter (Signed)
-----   Message from Ralene Bathe, MD sent at 10/14/2021  9:04 PM EDT ----- ?Diagnosis ?Skin , right medial forehead at glabella ?BASAL CELL CARCINOMA, INFILTRATIVE PATTERN, PERIPHERAL AND DEEP MARGINS INVOLVED ? ?Cancer -infiltrative basal cell carcinoma ?Can schedule for radiation therapy here at [facility] cancer center or Mohs micrographic surgery ?If patient's wants to come in for discussion, please make an appointment for him ?If he has a preference, please schedule for that preference ?

## 2021-10-20 ENCOUNTER — Encounter: Payer: Self-pay | Admitting: Dermatology

## 2021-10-21 ENCOUNTER — Ambulatory Visit (INDEPENDENT_AMBULATORY_CARE_PROVIDER_SITE_OTHER): Payer: Medicare Other | Admitting: Dermatology

## 2021-10-21 DIAGNOSIS — Z23 Encounter for immunization: Secondary | ICD-10-CM | POA: Diagnosis not present

## 2021-10-21 DIAGNOSIS — C44319 Basal cell carcinoma of skin of other parts of face: Secondary | ICD-10-CM

## 2021-10-21 NOTE — Progress Notes (Signed)
? ?  Follow-Up Visit ?  ?Subjective  ?Jeremy [name] is a 86 y.o. male who presents for the following: BCC bx proven ( right medial forehead at glabella, pt presents to discuss txt options). ? ?Patient accompanied by wife. ? ?The following portions of the chart were reviewed this encounter and updated as appropriate:  ? Tobacco  Allergies  Meds  Problems  Med Hx  Surg Hx  Fam Hx   ?  ?Review of Systems:  No other skin or systemic complaints except as noted in HPI or Assessment and Plan. ? ?Objective  ?Well appearing patient in no apparent distress; mood and affect are within normal limits. ? ?A focused examination was performed including face. Relevant physical exam findings are noted in the Assessment and Plan. ? ?right medial forehead at glabella ?Pink bx site ? ? ? ? ? ?Assessment & Plan  ?Basal cell carcinoma (BCC) of skin of other part of face ?right medial forehead at glabella ?Biopsy proven BCC, infiltrative pattern. ?(Pt felt sure this had been a scar since he was a child without change) ?Discussed treatment options Mohs vs Radiation treatment ?Will send referral to Dr. Geralynn Ochs office at [facility] for Mohs ? ?Related Procedures ?Ambulatory referral to Dermatology ? ?Return for as scheduled for TBSE. ? ?I, Othelia Pulling, RMA, am acting as scribe for Sarina Ser, MD . ?Documentation: I have reviewed the above documentation for accuracy and completeness, and I agree with the above. ? ?Sarina Ser, MD ? ?

## 2021-10-21 NOTE — Patient Instructions (Signed)

## 2021-10-22 ENCOUNTER — Encounter: Payer: Self-pay | Admitting: Dermatology

## 2021-10-27 ENCOUNTER — Telehealth: Payer: Self-pay

## 2021-10-27 DIAGNOSIS — E113393 Type 2 diabetes mellitus with moderate nonproliferative diabetic retinopathy without macular edema, bilateral: Secondary | ICD-10-CM | POA: Diagnosis not present

## 2021-10-27 DIAGNOSIS — H401122 Primary open-angle glaucoma, left eye, moderate stage: Secondary | ICD-10-CM | POA: Diagnosis not present

## 2021-10-27 LAB — HM DIABETES EYE EXAM

## 2021-10-27 NOTE — Telephone Encounter (Signed)
Called pt LMOVM specimen requested from [facility] pathology is here in the office, pt can come pick it up or review specimen her in the office ?

## 2021-10-30 ENCOUNTER — Other Ambulatory Visit: Payer: Self-pay

## 2021-10-30 DIAGNOSIS — C44319 Basal cell carcinoma of skin of other parts of face: Secondary | ICD-10-CM

## 2021-10-30 NOTE — Progress Notes (Signed)
Pt calling requesting we referral be sent to skin surgery center in [place], pt stats the Mohs appt in [place] is not until Aug and he does not want to wait that long.    Okay referral sent to skin surgery center, pt aware

## 2021-11-03 ENCOUNTER — Telehealth: Payer: Self-pay

## 2021-11-03 ENCOUNTER — Other Ambulatory Visit: Payer: Self-pay | Admitting: Family Medicine

## 2021-11-03 ENCOUNTER — Ambulatory Visit (INDEPENDENT_AMBULATORY_CARE_PROVIDER_SITE_OTHER): Payer: Medicare Other | Admitting: Family Medicine

## 2021-11-03 ENCOUNTER — Encounter: Payer: Self-pay | Admitting: Family Medicine

## 2021-11-03 VITALS — BP 124/80 | HR 73 | Resp 16 | Ht 75.0 in | Wt 234.0 lb

## 2021-11-03 DIAGNOSIS — E538 Deficiency of other specified B group vitamins: Secondary | ICD-10-CM

## 2021-11-03 DIAGNOSIS — G5601 Carpal tunnel syndrome, right upper limb: Secondary | ICD-10-CM

## 2021-11-03 DIAGNOSIS — I10 Essential (primary) hypertension: Secondary | ICD-10-CM

## 2021-11-03 DIAGNOSIS — I700 Atherosclerosis of aorta: Secondary | ICD-10-CM | POA: Diagnosis not present

## 2021-11-03 DIAGNOSIS — Z794 Long term (current) use of insulin: Secondary | ICD-10-CM

## 2021-11-03 DIAGNOSIS — I152 Hypertension secondary to endocrine disorders: Secondary | ICD-10-CM | POA: Diagnosis not present

## 2021-11-03 DIAGNOSIS — E785 Hyperlipidemia, unspecified: Secondary | ICD-10-CM | POA: Diagnosis not present

## 2021-11-03 DIAGNOSIS — E1129 Type 2 diabetes mellitus with other diabetic kidney complication: Secondary | ICD-10-CM | POA: Diagnosis not present

## 2021-11-03 DIAGNOSIS — E1159 Type 2 diabetes mellitus with other circulatory complications: Secondary | ICD-10-CM | POA: Diagnosis not present

## 2021-11-03 DIAGNOSIS — N400 Enlarged prostate without lower urinary tract symptoms: Secondary | ICD-10-CM | POA: Diagnosis not present

## 2021-11-03 DIAGNOSIS — E782 Mixed hyperlipidemia: Secondary | ICD-10-CM

## 2021-11-03 DIAGNOSIS — C44310 Basal cell carcinoma of skin of unspecified parts of face: Secondary | ICD-10-CM

## 2021-11-03 DIAGNOSIS — R809 Proteinuria, unspecified: Secondary | ICD-10-CM

## 2021-11-03 DIAGNOSIS — E1169 Type 2 diabetes mellitus with other specified complication: Secondary | ICD-10-CM

## 2021-11-03 MED ORDER — ROSUVASTATIN CALCIUM 10 MG PO TABS: 10.0000 mg | ORAL_TABLET | Freq: Every day | ORAL | 1 refills | Status: DC

## 2021-11-03 MED ORDER — BISOPROLOL FUMARATE 10 MG PO TABS: 10.0000 mg | ORAL_TABLET | Freq: Every day | ORAL | 1 refills | Status: DC

## 2021-11-03 MED ORDER — TELMISARTAN-HCTZ 80-25 MG PO TABS: 1.0000 | ORAL_TABLET | Freq: Every day | ORAL | 1 refills | Status: DC

## 2021-11-03 MED ORDER — OMEPRAZOLE 20 MG PO CPDR: 20.0000 mg | DELAYED_RELEASE_CAPSULE | Freq: Every day | ORAL | 1 refills | Status: DC

## 2021-11-03 NOTE — Telephone Encounter (Signed)
Copied from [phone]. Topic: Referral - Request for Referral >> Nov 03, 2021  3:43 PM Antonieta Iba C wrote: Pt called in for assistance. Pt says that he just had a visit with PCP and forgot to ask for a referral for his arthritis. Pt would like to have referral sent to Dr. Earnestine Leys, Ortho surgeon.   Please assist further.

## 2021-11-03 NOTE — Progress Notes (Signed)
Name: Jeremy [name]   MRN: [id number]    DOB: 17-Dec-1935   Date:11/03/2021       Progress Note  Subjective  Chief Complaint  Follow Up  HPI  DMII: he was diagnosed in the 90's during a hospital stay for small bowel obstruction. He was on oral medications for a long time, but has been on insulin for the past few years. Currently seeing Endocrinologist Dr. Honor Junes at [facility] . He denies polyphagia, polyuria or polydipsia. He is compliant and is up to date with eye exam - that is now showing some diabetic retinopathy.  No side effects of medications. He is on Tresiba  74 units per day, Jardiance and Metformin Last A1C done at Endo was 6.4% % . He also has a history of microalbuminuria and is on ARB, dyslipidemia and is on statin therapy    HTN: he denies chest pain, palpitation or dizziness He has good exercise tolerance, he is still able to work in his yard    Atherosclerosis of aorta:  found on CT scan done 07/2018, no side effects of Rosuvastatin last LDL at goal    GERD: had EGD done recently by Dr. Fleet Contras and no esophageal varices seen on EGD this time around, he has gastritis but controlled with PPI, discussed high risk use of PPI but he states not able to come off medication   Seborrhea: under the care of Dr. Nehemiah Massed, doing well on medication    Lower extremity edema he uses compression stocking hoses and it is controlling symptoms for him , no problems at this time, ulceration of leg resolved   Carpal tunnel syndrome: right wrist only, had surgery done by Dr. Sabra Heck about 10 years ago and has noticed tingling on middle fingers and sometimes right thumb, aggravated by driving or gardening, states symptoms are intermittent. He recently saw Dr. Sabra Heck and was advised to take naproxen and had steroid injection, explained nsaid's not good for him long term with personal history of gastritis and also history of microalbuminuria.   Basal cell carcinoma face: under the care of Dr.  Nehemiah Massed and waiting to have [place] surgery soon   Patient Active Problem List   Diagnosis Date Noted   Basal cell carcinoma ([place]) of skin of face 11/03/2021   Dyslipidemia associated with type 2 diabetes mellitus ([place]) 11/03/2021   Impingement syndrome of shoulder region 11/16/2018   Atherosclerosis of aorta ([place]) 07/21/2018   History of diverticulitis of colon 11/15/2017   Hyperlipidemia due to type 2 diabetes mellitus ([place]) 03/14/2017   Bilateral lower extremity edema 03/27/2016   Vitamin D deficiency 12/05/2015   Hx of transient ischemic attack (TIA) 09/17/2015   Diabetes mellitus type 2 without retinopathy ([place]) 05/31/2015   Hyperlipidemia 05/30/2015   Type 2 diabetes mellitus with renal manifestations ([place]) 01/08/2015   Incisional hernia, without obstruction or gangrene 03/23/2013   Enlarged prostate with lower urinary tract symptoms (LUTS) 11/03/2012   Myopic degeneration, bilateral 08/12/2012   Nonexudative age-related macular degeneration 07/15/2012   Anisometropia 04/08/2012   Pseudoaphakia 04/08/2012   History of retinal detachment 04/08/2012   Presence of intraocular lens 04/08/2012   Secondary open-angle glaucoma of left eye, severe stage 01/13/2012   Benign essential HTN 02/03/2007   Hypertension associated with diabetes ([place]) 02/03/2007    Past Surgical History:  Procedure Laterality Date   APPENDECTOMY     BLADDER DIVERTICULECTOMY  2013   COLON SURGERY Left 1980's   COLONOSCOPY W/ POLYPECTOMY  1990's   Dr  Crawford   COLONOSCOPY WITH PROPOFOL N/A 12/21/2014   Procedure: COLONOSCOPY WITH PROPOFOL;  Surgeon: Lollie Sails, MD;  Location: [facility] ENDOSCOPY;  Service: Endoscopy;  Laterality: N/A;   COLONOSCOPY WITH PROPOFOL N/A 08/16/2017   Procedure: COLONOSCOPY WITH PROPOFOL;  Surgeon: Lollie Sails, MD;  Location: [facility] ENDOSCOPY;  Service: Endoscopy;  Laterality: N/A;   COLONOSCOPY WITH PROPOFOL N/A 01/31/2021   Procedure: COLONOSCOPY WITH PROPOFOL;  Surgeon:  Robert Bellow, MD;  Location: [facility] ENDOSCOPY;  Service: Endoscopy;  Laterality: N/A;   ESOPHAGOGASTRODUODENOSCOPY (EGD) WITH PROPOFOL N/A 02/15/2015   Procedure: ESOPHAGOGASTRODUODENOSCOPY (EGD) WITH PROPOFOL;  Surgeon: Lollie Sails, MD;  Location: [facility] ENDOSCOPY;  Service: Endoscopy;  Laterality: N/A;   ESOPHAGOGASTRODUODENOSCOPY (EGD) WITH PROPOFOL N/A 06/10/2017   Procedure: ESOPHAGOGASTRODUODENOSCOPY (EGD) WITH PROPOFOL;  Surgeon: Lollie Sails, MD;  Location: [facility] ENDOSCOPY;  Service: Endoscopy;  Laterality: N/A;   ESOPHAGOGASTRODUODENOSCOPY (EGD) WITH PROPOFOL N/A 08/16/2017   Procedure: ESOPHAGOGASTRODUODENOSCOPY (EGD) WITH PROPOFOL;  Surgeon: Lollie Sails, MD;  Location: [facility] ENDOSCOPY;  Service: Endoscopy;  Laterality: N/A;   ESOPHAGOGASTRODUODENOSCOPY (EGD) WITH PROPOFOL N/A 01/31/2021   Procedure: ESOPHAGOGASTRODUODENOSCOPY (EGD) WITH PROPOFOL;  Surgeon: Robert Bellow, MD;  Location: [facility] ENDOSCOPY;  Service: Endoscopy;  Laterality: N/A;   EYE SURGERY     GLAUCOMA SURGERY Left 26's   [facility]   HEMORROIDECTOMY  1980's   [facility]   HERNIA REPAIR     [facility], Dr Alfredia Client HERNIA REPAIR Bilateral [phone]   LARYNX SURGERY  1990   3 times   RETINAL DETACHMENT SURGERY Right    TONSILLECTOMY     turp  2013   URETER SURGERY  2013   Dr Jacqlyn Larsen    Family History  Problem Relation Age of Onset   Diabetes Mother    Cataracts Mother    Metabolic syndrome Mother    Blindness Mother        r/t diabetes   Cataracts Father    Glaucoma Father        Retina-Detachment   Atrial fibrillation Father    Diverticulitis Sister    Glaucoma Brother    Diabetes Brother    Cancer Sister        Gallbladder   Cancer Daughter     Social History   Tobacco Use   Smoking status: Never   Smokeless tobacco: Never   Tobacco comments:    smoking cessation materials not required  Substance Use Topics   Alcohol use: No     Current Outpatient Medications:     brimonidine (ALPHAGAN P) 0.1 % SOLN, Place 1 drop into the left eye 2 (two) times daily. , Disp: , Rfl:    desonide (DESOWEN) 0.05 % ointment, Apply to rash under eye QD-BID PRN, Disp: 15 g, Rfl: 1   dorzolamide (TRUSOPT) 2 % ophthalmic solution, Place 1 drop into the left eye 2 (two) times daily., Disp: , Rfl: 3   empagliflozin (JARDIANCE) 25 MG TABS tablet, Take 1 tablet by mouth daily., Disp: , Rfl:    Exenatide ER (BYDUREON BCISE) 2 MG/0.85ML AUIJ, Inject 2 mg into the skin once a week., Disp: , Rfl:    finasteride (PROSCAR) 5 MG tablet, Take 1 tablet (5 mg total) by mouth daily., Disp: 90 tablet, Rfl: 3   hydrocortisone 2.5 % cream, APPLY TO THE AFFECTED AREAS OF FACE AND EARS EVERY OTHER NIGHT. ALTERNATING WITH KETOCONAZOLE CREAM, Disp: 28 g, Rfl: 0   insulin degludec (TRESIBA FLEXTOUCH) 200 UNIT/ML FlexTouch  Pen, Inject 74 Units into the skin in the morning., Disp: , Rfl:    ketoconazole (NIZORAL) 2 % cream, Apply to aa's face and ears every other night alternating with Hydrocortisone cream., Disp: 60 g, Rfl: 11   ketoconazole (NIZORAL) 2 % shampoo, Apply 1 application topically 3 (three) times a week. Shampoo scalp 3 days a week let sit 5 minutes and rinse off, Disp: 120 mL, Rfl: 11   metFORMIN (GLUCOPHAGE) 1000 MG tablet, Take 1,000 mg by mouth 2 (two) times daily with a meal., Disp: , Rfl:    mometasone (ELOCON) 0.1 % lotion, APPLY TO THE AFFECTED AREAS OF SCALP THREE NIGHTS PER WEEK, Disp: 60 mL, Rfl: 0   naproxen sodium (ALEVE) 220 MG tablet, Take 1 tablet (220 mg total) by mouth daily as needed., Disp: 30 tablet, Rfl: 0   omega-3 acid ethyl esters (LOVAZA) 1 g capsule, Take 2 capsules (2 g total) by mouth 2 (two) times daily., Disp: 480 capsule, Rfl: 3   tadalafil (CIALIS) 20 MG tablet, Take 0.5-1 tablets (10-20 mg total) by mouth every other day as needed for erectile dysfunction., Disp: 30 tablet, Rfl: 0   timolol (TIMOPTIC) 0.5 % ophthalmic solution, INSTILL 1 DROP INTO LEFT EYE  TWICE DAILY, Disp: , Rfl:    bisoprolol (ZEBETA) 10 MG tablet, Take 1 tablet (10 mg total) by mouth daily., Disp: 90 tablet, Rfl: 1   omeprazole (PRILOSEC) 20 MG capsule, Take 1 capsule (20 mg total) by mouth daily., Disp: 90 capsule, Rfl: 1   rosuvastatin (CRESTOR) 10 MG tablet, Take 1 tablet (10 mg total) by mouth at bedtime., Disp: 90 tablet, Rfl: 1   telmisartan-hydrochlorothiazide (MICARDIS HCT) 80-25 MG tablet, Take 1 tablet by mouth daily., Disp: 90 tablet, Rfl: 1  Allergies  Allergen Reactions   Demerol [Meperidine] Other (See Comments) and Anaphylaxis    MAKES BLOOD PRESSURE BOTTOM OUT hypotension   Dilaudid [Hydromorphone Hcl]    Hydromorphone     Other reaction(s): Unknown MAKES BLOOD PRESSURE BOTTOM OUT   Meperidine Hcl    Actos [Pioglitazone] Other (See Comments) and Hives   Dapagliflozin Rash   Penicillins Rash    I personally reviewed active problem list, medication list, allergies, family history, social history, health maintenance with the patient/caregiver today.   ROS  Constitutional: Negative for fever , positive for  weight change.  Respiratory: Negative for cough and shortness of breath.   Cardiovascular: Negative for chest pain or palpitations.  Gastrointestinal: Negative for abdominal pain, no bowel changes.  Musculoskeletal: Negative for gait problem or joint swelling.  Skin: Negative for rash.  Neurological: Negative for dizziness or headache.  No other specific complaints in a complete review of systems (except as listed in HPI above).   Objective  Vitals:   11/03/21 1350  BP: 124/80  Pulse: 73  Resp: 16  SpO2: 96%  Weight: 234 lb (106.1 kg)  Height: '6\' 3"'$  (1.905 m)    Body mass index is 29.25 kg/m.  Physical Exam  Constitutional: Patient appears well-developed and well-nourished.  No distress.  HEENT: head atraumatic, normocephalic, pupils equal and reactive to light, neck supple Cardiovascular: Normal rate, regular rhythm , split  second sound . No murmur heard. No BLE edema. Pulmonary/Chest: Effort normal and breath sounds normal. No respiratory distress. Abdominal: Soft.  There is no tenderness. Psychiatric: Patient has a normal mood and affect. behavior is normal. Judgment and thought content normal.   Recent Results (from the past 2160 hour(s))  Hemoglobin A1c  Status: None   Collection Time: 08/14/21 12:00 AM  Result Value Ref Range   Hemoglobin A1C 6.4   HM DIABETES FOOT EXAM     Status: None   Collection Time: 09/11/21 12:00 AM  Result Value Ref Range   HM Diabetic Foot Exam [place]      PHQ2/9:    11/03/2021    1:49 PM 05/05/2021    1:23 PM 03/04/2021    1:57 PM 10/29/2020    1:22 PM 05/01/2020    1:18 PM  Depression screen PHQ 2/9  Decreased Interest 0 0 0 0 0  Down, Depressed, Hopeless 0 0 0 0 0  PHQ - 2 Score 0 0 0 0 0  Altered sleeping 0 0     Tired, decreased energy 0 0     Change in appetite 0 0     Feeling bad or failure about yourself  0 0     Trouble concentrating 0 0     Moving slowly or fidgety/restless 0 0     Suicidal thoughts 0 0     PHQ-9 Score 0 0       phq 9 is negative   Fall Risk:    11/03/2021    1:49 PM 05/05/2021    1:23 PM 03/04/2021    1:58 PM 10/29/2020    1:22 PM 05/01/2020    1:18 PM  [place] in the past year? 0 0 0 0 0  Number falls in past yr: 0 0 0 0 0  Injury with Fall? 0 0 0 0 0  Risk for fall due to : No Fall Risks No Fall Risks No Fall Risks    Follow up Falls prevention discussed Falls prevention discussed Falls prevention discussed        Functional Status Survey: Is the patient deaf or have difficulty hearing?: No Does the patient have difficulty seeing, even when wearing glasses/contacts?: No Does the patient have difficulty concentrating, remembering, or making decisions?: No Does the patient have difficulty walking or climbing stairs?: No Does the patient have difficulty dressing or bathing?: No Does the patient have  difficulty doing errands alone such as visiting a doctor's office or shopping?: No    Assessment & Plan  1. Dyslipidemia associated with type 2 diabetes mellitus (HCC)  - rosuvastatin (CRESTOR) 10 MG tablet; Take 1 tablet (10 mg total) by mouth at bedtime.  Dispense: 90 tablet; Refill: 1  2. Basal cell carcinoma (BCC) of skin of face, unspecified part of face   3. Hypertension associated with diabetes ([place])  - telmisartan-hydrochlorothiazide (MICARDIS HCT) 80-25 MG tablet; Take 1 tablet by mouth daily.  Dispense: 90 tablet; Refill: 1  4. Type 2 diabetes mellitus with microalbuminuria, with long-term current use of insulin (HCC)  - telmisartan-hydrochlorothiazide (MICARDIS HCT) 80-25 MG tablet; Take 1 tablet by mouth daily.  Dispense: 90 tablet; Refill: 1  5. Benign essential HTN  - bisoprolol (ZEBETA) 10 MG tablet; Take 1 tablet (10 mg total) by mouth daily.  Dispense: 90 tablet; Refill: 1 - telmisartan-hydrochlorothiazide (MICARDIS HCT) 80-25 MG tablet; Take 1 tablet by mouth daily.  Dispense: 90 tablet; Refill: 1  6. Atherosclerosis of aorta ([place])   7. B12 deficiency   8. Benign prostatic hyperplasia without lower urinary tract symptoms   9. Mixed hyperlipidemia  - rosuvastatin (CRESTOR) 10 MG tablet; Take 1 tablet (10 mg total) by mouth at bedtime.  Dispense: 90 tablet; Refill: 1  10. Carpal tunnel syndrome of  right wrist

## 2021-11-03 NOTE — Addendum Note (Signed)
Addended by: Loistine Chance on: 11/03/2021 05:42 PM   Modules accepted: Orders

## 2021-11-17 DIAGNOSIS — G5601 Carpal tunnel syndrome, right upper limb: Secondary | ICD-10-CM | POA: Diagnosis not present

## 2021-11-19 ENCOUNTER — Telehealth: Payer: Self-pay

## 2021-11-19 NOTE — Telephone Encounter (Signed)
Pt called requesting we re fax his referral to [facility], they received it on May 18 but some how they lost it,   Okay we re faxed referral to skin surgery center, pt aware

## 2021-12-04 DIAGNOSIS — G5601 Carpal tunnel syndrome, right upper limb: Secondary | ICD-10-CM | POA: Diagnosis not present

## 2021-12-23 DIAGNOSIS — G5601 Carpal tunnel syndrome, right upper limb: Secondary | ICD-10-CM | POA: Diagnosis not present

## 2021-12-31 DIAGNOSIS — E1142 Type 2 diabetes mellitus with diabetic polyneuropathy: Secondary | ICD-10-CM | POA: Diagnosis not present

## 2021-12-31 DIAGNOSIS — B351 Tinea unguium: Secondary | ICD-10-CM | POA: Diagnosis not present

## 2022-01-13 HISTORY — PX: CARPAL TUNNEL RELEASE: SHX101

## 2022-01-22 DIAGNOSIS — C44319 Basal cell carcinoma of skin of other parts of face: Secondary | ICD-10-CM | POA: Diagnosis not present

## 2022-01-22 HISTORY — PX: MOHS SURGERY: SUR867

## 2022-01-27 ENCOUNTER — Ambulatory Visit: Payer: Self-pay

## 2022-01-27 NOTE — Telephone Encounter (Signed)
    Chief Complaint: Rash to both arms. Small red bumps. Thinks Vania Rea is causing this. Symptoms: Itchy Frequency: 1 week ago Pertinent Negatives: Patient denies  Disposition: '[]'$ ED /'[]'$ Urgent Care (no appt availability in office) / '[x]'$ Appointment(In office/virtual)/ '[]'$  [facility]/ '[]'$ Home Care/ '[]'$ Refused Recommended Disposition /'[]'$ [facility]/ '[]'$  Follow-up with PCP Additional Notes: Will only see Dr. Ancil Boozer. If she can work him in, please advise pt.  Reason for Disposition  [1] Severe localized itching AND [2] after 2 days of steroid cream  Answer Assessment - Initial Assessment Questions 1. APPEARANCE of RASH: "Describe the rash."      Red 2. LOCATION: "Where is the rash located?"      Both arms 3. NUMBER: "How many spots are there?"      Many 4. SIZE: "How big are the spots?" (Inches, centimeters or compare to size of a coin)      N/a 5. ONSET: "When did the rash start?"      1 week ago 6. ITCHING: "Does the rash itch?" If Yes, ask: "How bad is the itch?"  (Scale 0-10; or none, mild, moderate, severe)     Moderate 7. PAIN: "Does the rash hurt?" If Yes, ask: "How bad is the pain?"  (Scale 0-10; or none, mild, moderate, severe)    - NONE (0): no pain    - MILD (1-3): doesn't interfere with normal activities     - MODERATE (4-7): interferes with normal activities or awakens from sleep     - SEVERE (8-10): excruciating pain, unable to do any normal activities     None 8. OTHER SYMPTOMS: "Do you have any other symptoms?" (e.g., fever)     None 9. PREGNANCY: "Is there any chance you are pregnant?" "When was your last menstrual period?"     N/a  Protocols used: Rash or Redness - Localized-A-AH

## 2022-01-27 NOTE — Progress Notes (Unsigned)
Name: Jeremy [name]   MRN: [id number]    DOB: 07/06/1935   Date:01/28/2022       Progress Note  Subjective  Chief Complaint  Rash on Arms  HPI  Rash: patient was on Invokana for years, but in January his insurance required to switch to [place]. He has been alternating Invokana and Jardiance since, however about 6 days ago he noticed a pruriginous macular papular rash on both arms/ventral aspect . No oozing, he has been taking loratadine and using topical hydrocortisone that helps with the itching. Rash is spreading distally.   He reached out to Dr. Manfred Shirts to switch back to [facility] but was advised to follow up with me instead  He denies change in soap or any hygiene products , has not worked in the yard, no other new medications, food or recent travel. He denies systemic symptoms.   He had a Mohs' procedure on forehead but it was 3 days after the rash started   He has developed a similar rash when he took Actos and [place]   Patient Active Problem List   Diagnosis Date Noted   Basal cell carcinoma (BCC) of skin of face 11/03/2021   Dyslipidemia associated with type 2 diabetes mellitus ([place]) 11/03/2021   Impingement syndrome of shoulder region 11/16/2018   Atherosclerosis of aorta ([place]) 07/21/2018   History of diverticulitis of colon 11/15/2017   Hyperlipidemia due to type 2 diabetes mellitus ([place]) 03/14/2017   Bilateral lower extremity edema 03/27/2016   Vitamin D deficiency 12/05/2015   Hx of transient ischemic attack (TIA) 09/17/2015   Diabetes mellitus type 2 without retinopathy ([place]) 05/31/2015   Hyperlipidemia 05/30/2015   Type 2 diabetes mellitus with renal manifestations ([place]) 01/08/2015   Incisional hernia, without obstruction or gangrene 03/23/2013   Enlarged prostate with lower urinary tract symptoms (LUTS) 11/03/2012   Myopic degeneration, bilateral 08/12/2012   Nonexudative age-related macular degeneration 07/15/2012   Anisometropia 04/08/2012   Pseudoaphakia  04/08/2012   History of retinal detachment 04/08/2012   Presence of intraocular lens 04/08/2012   Secondary open-angle glaucoma of left eye, severe stage 01/13/2012   Benign essential HTN 02/03/2007   Hypertension associated with diabetes ([place]) 02/03/2007    Past Surgical History:  Procedure Laterality Date   APPENDECTOMY     BLADDER DIVERTICULECTOMY  2013   COLON SURGERY Left 1980's   COLONOSCOPY W/ POLYPECTOMY  1990's   Dr Sharlet Salina   COLONOSCOPY WITH PROPOFOL N/A 12/21/2014   Procedure: COLONOSCOPY WITH PROPOFOL;  Surgeon: Lollie Sails, MD;  Location: [facility] ENDOSCOPY;  Service: Endoscopy;  Laterality: N/A;   COLONOSCOPY WITH PROPOFOL N/A 08/16/2017   Procedure: COLONOSCOPY WITH PROPOFOL;  Surgeon: Lollie Sails, MD;  Location: [facility] ENDOSCOPY;  Service: Endoscopy;  Laterality: N/A;   COLONOSCOPY WITH PROPOFOL N/A 01/31/2021   Procedure: COLONOSCOPY WITH PROPOFOL;  Surgeon: Robert Bellow, MD;  Location: [facility] ENDOSCOPY;  Service: Endoscopy;  Laterality: N/A;   ESOPHAGOGASTRODUODENOSCOPY (EGD) WITH PROPOFOL N/A 02/15/2015   Procedure: ESOPHAGOGASTRODUODENOSCOPY (EGD) WITH PROPOFOL;  Surgeon: Lollie Sails, MD;  Location: [facility] ENDOSCOPY;  Service: Endoscopy;  Laterality: N/A;   ESOPHAGOGASTRODUODENOSCOPY (EGD) WITH PROPOFOL N/A 06/10/2017   Procedure: ESOPHAGOGASTRODUODENOSCOPY (EGD) WITH PROPOFOL;  Surgeon: Lollie Sails, MD;  Location: [facility] ENDOSCOPY;  Service: Endoscopy;  Laterality: N/A;   ESOPHAGOGASTRODUODENOSCOPY (EGD) WITH PROPOFOL N/A 08/16/2017   Procedure: ESOPHAGOGASTRODUODENOSCOPY (EGD) WITH PROPOFOL;  Surgeon: Lollie Sails, MD;  Location: [facility] ENDOSCOPY;  Service: Endoscopy;  Laterality: N/A;   ESOPHAGOGASTRODUODENOSCOPY (EGD) WITH PROPOFOL  N/A 01/31/2021   Procedure: ESOPHAGOGASTRODUODENOSCOPY (EGD) WITH PROPOFOL;  Surgeon: Robert Bellow, MD;  Location: [facility] ENDOSCOPY;  Service: Endoscopy;  Laterality: N/A;   EYE SURGERY     GLAUCOMA SURGERY Left  61's   [facility]   HEMORROIDECTOMY  1980's   [facility]   HERNIA REPAIR     [facility], Dr Alfredia Client HERNIA REPAIR Bilateral [phone]   LARYNX SURGERY  1990   3 times   RETINAL DETACHMENT SURGERY Right    TONSILLECTOMY     turp  2013   URETER SURGERY  2013   Dr Jacqlyn Larsen    Family History  Problem Relation Age of Onset   Diabetes Mother    Cataracts Mother    Metabolic syndrome Mother    Blindness Mother        r/t diabetes   Cataracts Father    Glaucoma Father        Retina-Detachment   Atrial fibrillation Father    Diverticulitis Sister    Glaucoma Brother    Diabetes Brother    Cancer Sister        Gallbladder   Cancer Daughter     Social History   Tobacco Use   Smoking status: Never   Smokeless tobacco: Never   Tobacco comments:    smoking cessation materials not required  Substance Use Topics   Alcohol use: No     Current Outpatient Medications:    bisoprolol (ZEBETA) 10 MG tablet, Take 1 tablet (10 mg total) by mouth daily., Disp: 90 tablet, Rfl: 1   brimonidine (ALPHAGAN P) 0.1 % SOLN, Place 1 drop into the left eye 2 (two) times daily. , Disp: , Rfl:    desonide (DESOWEN) 0.05 % ointment, Apply to rash under eye QD-BID PRN, Disp: 15 g, Rfl: 1   dorzolamide (TRUSOPT) 2 % ophthalmic solution, Place 1 drop into the left eye 2 (two) times daily., Disp: , Rfl: 3   empagliflozin (JARDIANCE) 25 MG TABS tablet, Take 1 tablet by mouth daily., Disp: , Rfl:    Exenatide ER (BYDUREON BCISE) 2 MG/0.85ML AUIJ, Inject 2 mg into the skin once a week., Disp: , Rfl:    finasteride (PROSCAR) 5 MG tablet, Take 1 tablet (5 mg total) by mouth daily., Disp: 90 tablet, Rfl: 3   hydrocortisone 2.5 % cream, APPLY TO THE AFFECTED AREAS OF FACE AND EARS EVERY OTHER NIGHT. ALTERNATING WITH KETOCONAZOLE CREAM, Disp: 28 g, Rfl: 0   insulin degludec (TRESIBA FLEXTOUCH) 200 UNIT/ML FlexTouch Pen, Inject 74 Units into the skin in the morning., Disp: , Rfl:    ketoconazole  (NIZORAL) 2 % cream, Apply to aa's face and ears every other night alternating with Hydrocortisone cream., Disp: 60 g, Rfl: 11   ketoconazole (NIZORAL) 2 % shampoo, Apply 1 application topically 3 (three) times a week. Shampoo scalp 3 days a week let sit 5 minutes and rinse off, Disp: 120 mL, Rfl: 11   metFORMIN (GLUCOPHAGE) 1000 MG tablet, Take 1,000 mg by mouth 2 (two) times daily with a meal., Disp: , Rfl:    mometasone (ELOCON) 0.1 % lotion, APPLY TO THE AFFECTED AREAS OF SCALP THREE NIGHTS PER WEEK, Disp: 60 mL, Rfl: 0   naproxen sodium (ALEVE) 220 MG tablet, Take 1 tablet (220 mg total) by mouth daily as needed., Disp: 30 tablet, Rfl: 0   omega-3 acid ethyl esters (LOVAZA) 1 g capsule, Take 2 capsules (2 g total) by mouth 2 (two) times daily., Disp:  480 capsule, Rfl: 3   omeprazole (PRILOSEC) 20 MG capsule, Take 1 capsule (20 mg total) by mouth daily., Disp: 90 capsule, Rfl: 1   rosuvastatin (CRESTOR) 10 MG tablet, Take 1 tablet (10 mg total) by mouth at bedtime., Disp: 90 tablet, Rfl: 1   tadalafil (CIALIS) 20 MG tablet, Take 0.5-1 tablets (10-20 mg total) by mouth every other day as needed for erectile dysfunction., Disp: 30 tablet, Rfl: 0   telmisartan-hydrochlorothiazide (MICARDIS HCT) 80-25 MG tablet, Take 1 tablet by mouth daily., Disp: 90 tablet, Rfl: 1   timolol (TIMOPTIC) 0.5 % ophthalmic solution, INSTILL 1 DROP INTO LEFT EYE TWICE DAILY, Disp: , Rfl:   Allergies  Allergen Reactions   Demerol [Meperidine] Other (See Comments) and Anaphylaxis    MAKES BLOOD PRESSURE BOTTOM OUT hypotension   Dilaudid [Hydromorphone Hcl]    Hydromorphone     Other reaction(s): Unknown MAKES BLOOD PRESSURE BOTTOM OUT   Meperidine Hcl    Actos [Pioglitazone] Other (See Comments) and Hives   Dapagliflozin Rash   Penicillins Rash    I personally reviewed active problem list, medication list, allergies, family history, social history, health maintenance with the patient/caregiver  today.   ROS  Ten systems reviewed and is negative except as mentioned in HPI   Objective  Vitals:   01/28/22 1104  BP: 122/70  Pulse: 77  Resp: 16  SpO2: 92%  Weight: 220 lb (99.8 kg)  Height: '6\' 3"'$  (1.905 m)    Body mass index is 27.5 kg/m.  Physical Exam  Constitutional: Patient appears well-developed and well-nourished. Obese   HEENT: head atraumatic, normocephalic, pupils equal and reactive to light, neck supple Cardiovascular: Normal rate, regular rhythm and normal heart sounds.  No murmur heard. No BLE edema. Pulmonary/Chest: Effort normal and breath sounds normal. No respiratory distress. Abdominal: Soft.  There is no tenderness. Skin: recent scar from excisional biopsy forehead in good aspect, arms have a macular papular rash that blanches with touch on ventral aspect of both arms to about 2 inches of wrist  Psychiatric: Patient has a normal mood and affect. behavior is normal. Judgment and thought content normal.   PHQ2/9:    01/28/2022   11:03 AM 11/03/2021    1:49 PM 05/05/2021    1:23 PM 03/04/2021    1:57 PM 10/29/2020    1:22 PM  Depression screen PHQ 2/9  Decreased Interest 0 0 0 0 0  Down, Depressed, Hopeless 0 0 0 0 0  PHQ - 2 Score 0 0 0 0 0  Altered sleeping  0 0    Tired, decreased energy  0 0    Change in appetite  0 0    Feeling bad or failure about yourself   0 0    Trouble concentrating  0 0    Moving slowly or fidgety/restless  0 0    Suicidal thoughts  0 0    PHQ-9 Score  0 0      phq 9 is negative   Fall Risk:    01/28/2022   10:57 AM 11/03/2021    1:49 PM 05/05/2021    1:23 PM 03/04/2021    1:58 PM 10/29/2020    1:22 PM  Fall Risk   Falls in the past year? 0 0 0 0 0  Number falls in past yr: 0 0 0 0 0  Injury with Fall? 0 0 0 0 0  Risk for fall due to : No Fall Risks No Fall Risks No Fall  Risks No Fall Risks   Follow up Falls prevention discussed Falls prevention discussed Falls prevention discussed Falls prevention discussed        Functional Status Survey: Is the patient deaf or have difficulty hearing?: No Does the patient have difficulty seeing, even when wearing glasses/contacts?: No Does the patient have difficulty concentrating, remembering, or making decisions?: No Does the patient have difficulty walking or climbing stairs?: Yes Does the patient have difficulty dressing or bathing?: No Does the patient have difficulty doing errands alone such as visiting a doctor's office or shopping?: No    Assessment & Plan  1. Rash in adult  - mometasone (ELOCON) 0.1 % cream; Apply topically daily.  Dispense: 45 g; Refill: 0   It is possible that the rash is secondary to Jardiance, advised to stop taking medication and see if resolves, use topical medication, continue loratadine.   Patient denies any systemic symptoms of anaphylaxis.   He will reach out to Dr. Honor Junes for refills of Invokana if rashes resolves once he stops taking [place]

## 2022-01-27 NOTE — Telephone Encounter (Signed)
Appt made and patient notified

## 2022-01-27 NOTE — Telephone Encounter (Signed)
Pt states he thinks he is breaking out from empagliflozin (JARDIANCE) 25 MG TABS tablet   Pt states he has discontinued Jardiance and started taking Invokana he had at home, but was alternating both medicine   Pt has rash on both arms x1w   Left message to call back.

## 2022-01-28 ENCOUNTER — Encounter: Payer: Self-pay | Admitting: Family Medicine

## 2022-01-28 ENCOUNTER — Ambulatory Visit (INDEPENDENT_AMBULATORY_CARE_PROVIDER_SITE_OTHER): Payer: Medicare Other | Admitting: Family Medicine

## 2022-01-28 VITALS — BP 122/70 | HR 77 | Resp 16 | Ht 75.0 in | Wt 220.0 lb

## 2022-01-28 DIAGNOSIS — E1142 Type 2 diabetes mellitus with diabetic polyneuropathy: Secondary | ICD-10-CM | POA: Diagnosis not present

## 2022-01-28 DIAGNOSIS — R21 Rash and other nonspecific skin eruption: Secondary | ICD-10-CM

## 2022-01-28 DIAGNOSIS — E1169 Type 2 diabetes mellitus with other specified complication: Secondary | ICD-10-CM | POA: Diagnosis not present

## 2022-01-28 DIAGNOSIS — I700 Atherosclerosis of aorta: Secondary | ICD-10-CM | POA: Diagnosis not present

## 2022-01-28 DIAGNOSIS — R0602 Shortness of breath: Secondary | ICD-10-CM | POA: Diagnosis not present

## 2022-01-28 DIAGNOSIS — E1159 Type 2 diabetes mellitus with other circulatory complications: Secondary | ICD-10-CM | POA: Diagnosis not present

## 2022-01-28 DIAGNOSIS — E785 Hyperlipidemia, unspecified: Secondary | ICD-10-CM | POA: Diagnosis not present

## 2022-01-28 DIAGNOSIS — Z794 Long term (current) use of insulin: Secondary | ICD-10-CM | POA: Diagnosis not present

## 2022-01-28 DIAGNOSIS — I152 Hypertension secondary to endocrine disorders: Secondary | ICD-10-CM | POA: Diagnosis not present

## 2022-01-28 MED ORDER — MOMETASONE FUROATE 0.1 % EX CREA: TOPICAL_CREAM | Freq: Every day | CUTANEOUS | 0 refills | Status: DC

## 2022-01-30 ENCOUNTER — Ambulatory Visit: Payer: Medicare Other | Admitting: Family Medicine

## 2022-02-04 DIAGNOSIS — G8918 Other acute postprocedural pain: Secondary | ICD-10-CM | POA: Diagnosis not present

## 2022-02-04 DIAGNOSIS — G5601 Carpal tunnel syndrome, right upper limb: Secondary | ICD-10-CM | POA: Diagnosis not present

## 2022-02-25 DIAGNOSIS — E1169 Type 2 diabetes mellitus with other specified complication: Secondary | ICD-10-CM | POA: Diagnosis not present

## 2022-02-25 DIAGNOSIS — I152 Hypertension secondary to endocrine disorders: Secondary | ICD-10-CM | POA: Diagnosis not present

## 2022-02-25 DIAGNOSIS — Z794 Long term (current) use of insulin: Secondary | ICD-10-CM | POA: Diagnosis not present

## 2022-02-25 DIAGNOSIS — E1159 Type 2 diabetes mellitus with other circulatory complications: Secondary | ICD-10-CM | POA: Diagnosis not present

## 2022-02-25 DIAGNOSIS — E785 Hyperlipidemia, unspecified: Secondary | ICD-10-CM | POA: Diagnosis not present

## 2022-02-25 DIAGNOSIS — E1142 Type 2 diabetes mellitus with diabetic polyneuropathy: Secondary | ICD-10-CM | POA: Diagnosis not present

## 2022-02-25 LAB — HEMOGLOBIN A1C: Hemoglobin A1C: 8.2

## 2022-03-05 ENCOUNTER — Ambulatory Visit (INDEPENDENT_AMBULATORY_CARE_PROVIDER_SITE_OTHER): Payer: Medicare Other

## 2022-03-05 VITALS — BP 124/62 | HR 76 | Temp 98.7°F | Resp 17 | Ht 75.0 in | Wt 215.6 lb

## 2022-03-05 DIAGNOSIS — Z23 Encounter for immunization: Secondary | ICD-10-CM

## 2022-03-05 DIAGNOSIS — Z0000 Encounter for general adult medical examination without abnormal findings: Secondary | ICD-10-CM

## 2022-03-05 NOTE — Progress Notes (Signed)
Subjective:   Jeremy [name] is a 86 y.o. male who presents for Medicare Annual/Subsequent preventive examination.  Review of Systems     Per HPI unless specifically indicated below.  Cardiac Risk Factors include: advanced age (>87mn, >>83women);male gender        Objective:    Today's Vitals   03/05/22 1348 03/05/22 1401  BP: 124/62   Pulse: 76   Resp: 17   Temp: 98.7 F (37.1 C)   TempSrc: Oral   SpO2: 95%   Weight: 215 lb 9.6 oz (97.8 kg)   Height: '6\' 3"'$  (1.905 m)   PainSc: 2  2   PainLoc: Hand    Body mass index is 26.95 kg/m.     03/04/2021    1:57 PM 01/31/2021    7:20 AM 02/29/2020    2:23 PM 02/28/2019    1:58 PM 02/25/2018    1:05 PM 08/16/2017    8:21 AM 04/08/2017    2:52 PM  Advanced Directives  Does Patient Have a Medical Advance Directive? Yes Yes Yes Yes Yes Yes Yes  Type of AParamedicof ACharleroiLiving will Living will;Healthcare Power of AMorristownLiving will HBaldwin CityLiving will HProspectLiving will HCastrovilleLiving will HRaysalLiving will  Copy of HHolualoain Chart? Yes - validated most recent copy scanned in chart (See row information) No - copy requested Yes - validated most recent copy scanned in chart (See row information) Yes - validated most recent copy scanned in chart (See row information) Yes No - copy requested No - copy requested    Current Medications (verified) Outpatient Encounter Medications as of 03/05/2022  Medication Sig   bisoprolol (ZEBETA) 10 MG tablet Take 1 tablet (10 mg total) by mouth daily.   brimonidine (ALPHAGAN P) 0.1 % SOLN Place 1 drop into the left eye 2 (two) times daily.    desonide (DESOWEN) 0.05 % ointment Apply to rash under eye QD-BID PRN   dorzolamide (TRUSOPT) 2 % ophthalmic solution Place 1 drop into the left eye 2 (two) times daily.   empagliflozin  (JARDIANCE) 25 MG TABS tablet Take 1 tablet by mouth daily.   Exenatide ER (BYDUREON BCISE) 2 MG/0.85ML AUIJ Inject 2 mg into the skin once a week.   finasteride (PROSCAR) 5 MG tablet Take 1 tablet (5 mg total) by mouth daily.   glipiZIDE (GLUCOTROL XL) 2.5 MG 24 hr tablet Take 2.5 mg by mouth daily with breakfast.   hydrocortisone 2.5 % cream APPLY TO THE AFFECTED AREAS OF FACE AND EARS EVERY OTHER NIGHT. ALTERNATING WITH KETOCONAZOLE CREAM   insulin degludec (TRESIBA FLEXTOUCH) 200 UNIT/ML FlexTouch Pen Inject 45 Units into the skin in the morning.   ketoconazole (NIZORAL) 2 % cream Apply to aa's face and ears every other night alternating with Hydrocortisone cream.   ketoconazole (NIZORAL) 2 % shampoo Apply 1 application topically 3 (three) times a week. Shampoo scalp 3 days a week let sit 5 minutes and rinse off   metFORMIN (GLUCOPHAGE) 1000 MG tablet Take 1,000 mg by mouth 2 (two) times daily with a meal.   mometasone (ELOCON) 0.1 % cream Apply topically daily.   mometasone (ELOCON) 0.1 % lotion APPLY TO THE AFFECTED AREAS OF SCALP THREE NIGHTS PER WEEK   omega-3 acid ethyl esters (LOVAZA) 1 g capsule Take 2 capsules (2 g total) by mouth 2 (two) times daily.   omeprazole (PRILOSEC)  20 MG capsule Take 1 capsule (20 mg total) by mouth daily.   rosuvastatin (CRESTOR) 10 MG tablet Take 1 tablet (10 mg total) by mouth at bedtime.   tadalafil (CIALIS) 20 MG tablet Take 0.5-1 tablets (10-20 mg total) by mouth every other day as needed for erectile dysfunction.   telmisartan-hydrochlorothiazide (MICARDIS HCT) 80-25 MG tablet Take 1 tablet by mouth daily.   timolol (TIMOPTIC) 0.5 % ophthalmic solution INSTILL 1 DROP INTO LEFT EYE TWICE DAILY   naproxen sodium (ALEVE) 220 MG tablet Take 1 tablet (220 mg total) by mouth daily as needed. (Patient not taking: Reported on 03/05/2022)   [DISCONTINUED] canagliflozin (INVOKANA) 300 MG TABS tablet Take by mouth. (Patient not taking: Reported on 03/05/2022)    [DISCONTINUED] cephALEXin (KEFLEX) 500 MG capsule TAKE 1 CAPSULE BY MOUTH EVERY 6 HOURS FOR 5 DAYS (Patient not taking: Reported on 03/05/2022)   No facility-administered encounter medications on file as of 03/05/2022.    Allergies (verified) Demerol [meperidine], Dilaudid [hydromorphone hcl], Hydromorphone, Meperidine hcl, Actos [pioglitazone], Dapagliflozin, and Penicillins   History: Past Medical History:  Diagnosis Date   Allergy    Basal cell carcinoma 10/09/2021   right medial forehead at glabella, need treatment   Chronic kidney disease    Diabetes mellitus without complication (HCC)    Diabetic neuropathy ([place])    Diverticula, colon 1980's   Diverticulosis    Enlarged prostate    GERD (gastroesophageal reflux disease)    Glaucoma    Hemorrhoids    High cholesterol    History of hiatal hernia    Hyperlipidemia    Hypertension    Obesity    Onychomycosis    Seizures (HCC)    Small bowel obstruction ([place]) 1990's   Urination disorder    Past Surgical History:  Procedure Laterality Date   APPENDECTOMY     BLADDER DIVERTICULECTOMY  2013   COLON SURGERY Left 1980's   COLONOSCOPY W/ POLYPECTOMY  1990's   Dr Sharlet Salina   COLONOSCOPY WITH PROPOFOL N/A 12/21/2014   Procedure: COLONOSCOPY WITH PROPOFOL;  Surgeon: Lollie Sails, MD;  [facility] END[facility]COPY;  Service: Endoscopy;  Laterality: N/A;   COLONOSCOPY WITH PROPOFOL N/A 08/16/2017   Procedure: COLONOSCOPY WITH PROPOFOL;  Surgeon: Lollie Sails, MD;  Location: [facility] ENDOSCOPY;  Service: Endoscopy;  Laterality: N/A;   COLONOSCOPY WITH PROPOFOL N/A 01/31/2021   Procedure: COLONOSCOPY WITH PROPOFOL;  Surgeon: Robert Bellow, MD;  Location: [facility] ENDOSCOPY;  Service: Endoscopy;  Laterality: N/A;   ESOPHAGOGASTRODUODENOSCOPY (EGD) WITH PROPOFOL N/A 02/15/2015   Procedure: ESOPHAGOGASTRODUODENOSCOPY (EGD) WITH PROPOFOL;  Surgeon: Lollie Sails, MD;  Location: [facility] ENDOSCOPY;  Service: Endoscopy;  Laterality: N/A;    ESOPHAGOGASTRODUODENOSCOPY (EGD) WITH PROPOFOL N/A 06/10/2017   Procedure: ESOPHAGOGASTRODUODENOSCOPY (EGD) WITH PROPOFOL;  Surgeon: Lollie Sails, MD;  Location: [facility] ENDOSCOPY;  Service: Endoscopy;  Laterality: N/A;   ESOPHAGOGASTRODUODENOSCOPY (EGD) WITH PROPOFOL N/A 08/16/2017   Procedure: ESOPHAGOGASTRODUODENOSCOPY (EGD) WITH PROPOFOL;  Surgeon: Lollie Sails, MD;  Location: [facility] ENDOSCOPY;  Service: Endoscopy;  Laterality: N/A;   ESOPHAGOGASTRODUODENOSCOPY (EGD) WITH PROPOFOL N/A 01/31/2021   Procedure: ESOPHAGOGASTRODUODENOSCOPY (EGD) WITH PROPOFOL;  Surgeon: Robert Bellow, MD;  Location: [facility] ENDOSCOPY;  Service: Endoscopy;  Laterality: N/A;   EYE SURGERY     GLAUCOMA SURGERY Left 1990's   [facility]   HEMORROIDECTOMY  1980's   [facility]   HERNIA REPAIR     [facility], Dr Alfredia Client HERNIA REPAIR Bilateral [phone]   [place]  3 times   RETINAL DETACHMENT SURGERY Right    TONSILLECTOMY     turp  2013   URETER SURGERY  2013   Dr Jacqlyn Larsen   Family History  Problem Relation Age of Onset   Diabetes Mother    Cataracts Mother    Metabolic syndrome Mother    Blindness Mother        r/t diabetes   Cataracts Father    Glaucoma Father        Retina-Detachment   Atrial fibrillation Father    Diverticulitis Sister    Glaucoma Brother    Diabetes Brother    Cancer Sister        Gallbladder   Cancer Daughter    Social History   Socioeconomic History   Marital status: Married    Spouse name: Pamala Hurry    Number of children: 3   Years of education: Not on file   Highest education level: Professional school degree (e.g., MD, DDS, DVM, JD)  Occupational History   Occupation: Industrial/product designer     Comment: still working - Engineer, maintenance as a Optometrist   Tobacco Use   Smoking status: Never   Smokeless tobacco: Never   Tobacco comments:    smoking cessation materials not required  Vaping Use   Vaping Use: Never used  Substance and Sexual Activity    Alcohol use: No   Drug use: No   Sexual activity: Yes  Other Topics Concern   Not on file  Social History Narrative   Married , still works as a Engineer, drilling Environmental consultant)   Three children ( one died in his 7's with colon cancer) , one lives in [place] and the other one in [place]   His siblings are in [place] and [place] Strain: [place]  (03/04/2021)   Overall Financial Resource Strain (CARDIA)    Difficulty of Paying Living Expenses: Not hard at [place]: No Food Insecurity (03/05/2022)   Hunger Vital Sign    Worried About Running Out of Food in the Last Year: Never true    [place] in the Last Year: Never true  Transportation Needs: No Transportation Needs (03/04/2021)   PRAPARE - Hydrologist (Medical): No    Lack of Transportation (Non-Medical): No  Physical Activity: Inactive (03/05/2022)   Exercise Vital Sign    Days of Exercise per Week: 0 days    Minutes of Exercise per Session: 0 min  Stress: No Stress Concern Present (03/05/2022)   [place]    Feeling of Stress : Not at all  Social Connections: [place] (03/04/2021)   Social Connection and Isolation Panel [NHANES]    Frequency of Communication with Friends and Family: More than three times a week    Frequency of Social Gatherings with Friends and Family: Once a week    Attends Religious Services: More than 4 times per year    Active Member of [employer] or Organizations: Yes    Attends Archivist Meetings: Never    Marital Status: Married    Tobacco Counseling Counseling given: No Tobacco comments: smoking cessation materials not required   Clinical Intake:     Pain : 0-10 Pain Score: 2  Pain Type: Acute pain Pain Location: Hand Pain Orientation: Right Pain Descriptors / Indicators: Aching Pain Frequency: Intermittent     Nutritional  Status: BMI  of 19-24  Normal Nutritional Risks: Unintentional weight loss Diabetes: Yes CBG done?: Yes CBG resulted in Enter/ Edit results?: Yes Did pt. bring in CBG monitor from home?: Yes Glucose Meter Downloaded?: Yes  How often do you need to have someone help you when you read instructions, pamphlets, or other written materials from your doctor or pharmacy?: 1 - Never  Diabetic?Nutrition Risk Assessment:  Has the patient had any N/V/D within the last 2 months?  No  Does the patient have any non-healing wounds?  No  Has the patient had any unintentional weight loss or weight gain?  Yes   Diabetes:  Is the patient diabetic?  Yes  If diabetic, was a CBG obtained today?  Yes  Did the patient bring in their glucometer from home?  Yes  How often do you monitor your CBG's? Continuously glucometer .   Financial Strains and Diabetes Management:  Are you having any financial strains with the device, your supplies or your medication? No .  Does the patient want to be seen by Chronic Care Management for management of their diabetes?  No  Would the patient like to be referred to a Nutritionist or for Diabetic Management?  No   Diabetic Exams:  Diabetic Eye Exam: Completed 10/27/2021 Diabetic Foot Exam: Completed 09/11/3021    Interpreter Needed?: No  Information entered by :: Donnie Mesa, [place]   Activities of Daily Living    03/05/2022    2:05 PM 01/28/2022   11:04 AM  In your present state of health, do you have any difficulty performing the following activities:  Hearing? 0 0  Vision? 1 0  Difficulty concentrating or making decisions? 0 0  Walking or climbing stairs? 0 1  Dressing or bathing? 0 0  Doing errands, shopping? 1 0    Patient Care Team: Steele Sizer, MD as PCP - General (Family Medicine) Requested, Self Birder Robson, MD as Consulting Physician (Ophthalmology) Earnestine Leys, MD as Consulting Physician (Orthopedic Surgery) Isaias Cowman, MD  as Consulting Physician (Cardiology) Lonia Farber, MD as Consulting Physician (Internal Medicine) Sharlotte Alamo, [place] (Podiatry) Ralene Bathe, MD (Dermatology) Bary Castilla Forest Gleason, MD as Consulting Physician (General Surgery)  Indicate any recent Medical Services you may have received from other than [facility] providers in the past year (date may be approximate). No hospitalization in the past 12 months.     Assessment:   This is a routine wellness examination for Jerzy.  Hearing/Vision screen Denies any hearing issues. Denies any changes in vision. Biannual eye exam [facility]   Dietary issues and exercise activities discussed: Current Exercise Habits: Home exercise routine, Type of exercise: Other - see comments (yardwork), Time (Minutes): 15, Intensity: Mild, Exercise limited by: orthopedic condition(s);Other - see comments   Goals Addressed             This Visit's Progress    Stay Active and Independent           Why is this important?   Regular activity or exercise is important to managing back pain.  Activity helps to keep your muscles strong.  You will sleep better and feel more relaxed.  You will have more energy and feel less stressed.  If you are not active now, start slowly. Little changes make a big difference.  Rest, but not too much.  Stay as active as you can and listen to your body's signals.           Depression Screen  01/28/2022   11:03 AM 11/03/2021    1:49 PM 05/05/2021    1:23 PM 03/04/2021    1:57 PM 10/29/2020    1:22 PM 05/01/2020    1:18 PM 02/29/2020    2:23 PM  PHQ 2/9 Scores  PHQ - 2 Score 0 0 0 0 0 0 0  PHQ- 9 Score  0 0        Fall Risk    03/05/2022    2:05 PM 01/28/2022   10:57 AM 11/03/2021    1:49 PM 05/05/2021    1:23 PM 03/04/2021    1:58 PM  Fall Risk   Falls in the past year? 0 0 0 0 0  Number falls in past yr: 0 0 0 0 0  Injury with Fall? 0 0 0 0 0  Risk for fall due to : No Fall Risks No Fall Risks No  Fall Risks No Fall Risks No Fall Risks  Follow up Falls evaluation completed Falls prevention discussed Falls prevention discussed Falls prevention discussed Falls prevention discussed    FALL RISK PREVENTION PERTAINING TO THE HOME:  Any stairs in or around the home? Yes  If so, are there any without handrails? No  Home free of loose throw rugs in walkways, pet beds, electrical cords, etc? No  Adequate lighting in your home to reduce risk of falls? Yes   ASSISTIVE DEVICES UTILIZED TO PREVENT FALLS:  Life alert? No  Use of a cane, walker or w/c? Yes  Grab bars in the bathroom? Yes  Shower chair or bench in shower? No  Elevated toilet seat or a handicapped toilet? No   TIMED UP AND GO:  Was the test performed? Yes .  Length of time to ambulate 10 feet: 10 sec.   Gait slow and steady without use of assistive device  Cognitive Function:        03/05/2022    2:07 PM 02/28/2019    2:02 PM 02/25/2018    1:03 PM  6CIT Screen  What Year? 0 points 0 points 0 points  What month? 0 points 0 points 0 points  What time? 0 points 0 points 0 points  Count back from 20 0 points 0 points 0 points  Months in reverse 0 points 0 points 0 points  Repeat phrase 0 points 0 points 4 points  Total Score 0 points 0 points 4 points    Immunizations Immunization History  Administered Date(s) Administered   Fluad Quad(high Dose 65+) 02/28/2019, 03/04/2021, 03/05/2022   Influenza, High Dose Seasonal PF 03/12/2016, 02/22/2017, 02/25/2018   Influenza,inj,Quad PF,6+ Mos 03/28/2015   Influenza-Unspecified 02/13/2020   Moderna Sars-Covid-2 Vaccination 09/20/2020   PFIZER(Purple Top)SARS-COV-2 Vaccination 06/21/2019, 07/12/2019, 03/12/2020   Pfizer Covid-19 Vaccine Bivalent Booster 20yr & up 02/28/2021   Pneumococcal Conjugate-13 08/07/2013, 09/04/2013   Pneumococcal Polysaccharide-23 04/04/2012   Tdap 04/04/2012   Zoster Recombinat (Shingrix) 12/14/2017, 02/21/2018   Zoster, Live 09/17/2010     TDAP status: Up to date  Flu Vaccine status: Completed at today's visit  Pneumococcal vaccine status: Up to date  Covid-19 vaccine status: Completed vaccines  Qualifies for Shingles Vaccine? Yes   Zostavax completed Yes   Shingrix Completed?: Yes  Screening Tests Health Maintenance  Topic Date Due   COVID-19 Vaccine (6 - Pfizer risk series) 04/25/2021   HEMOGLOBIN A1C  02/14/2022   TETANUS/TDAP  04/04/2022   Diabetic kidney evaluation - GFR measurement  05/05/2022   Diabetic kidney evaluation - Urine ACR  05/05/2022  FOOT EXAM  09/12/2022   OPHTHALMOLOGY EXAM  10/28/2022   COLONOSCOPY (Pts 45-83yr Insurance coverage will need to be confirmed)  02/01/2024   Pneumonia Vaccine 86 Years old  Completed   INFLUENZA VACCINE  Completed   Zoster Vaccines- Shingrix  Completed   HPV VACCINES  Aged Out    Health Maintenance  Health Maintenance Due  Topic Date Due   COVID-19 Vaccine (6 - Pfizer risk series) 04/25/2021   HEMOGLOBIN A1C  02/14/2022    Colorectal cancer screening: Type of screening: Colonoscopy. Completed 01/31/2021. Repeat every 3 years  Lung Cancer Screening: (Low Dose CT Chest recommended if Age 84-80years, 30 pack-year currently smoking OR have quit w/in 15years.) does not qualify.    Additional Screening:  Hepatitis C Screening: does qualify  Vision Screening: Recommended annual ophthalmology exams for early detection of glaucoma and other disorders of the eye. Is the patient up to date with their annual eye exam?  Yes  Who is the provider or what is the name of the office in which the patient attends annual eye exams? [facility] If pt is not established with a provider, would they like to be referred to a provider to establish care? No .   Dental Screening: Recommended annual dental exams for proper oral hygiene  Community Resource Referral / Chronic Care Management: CRR required this visit?  No   CCM required this visit?  No       Plan:     I have personally reviewed and noted the following in the patient's chart:   Medical and social history Use of alcohol, tobacco or illicit drugs  Current medications and supplements including opioid prescriptions. Patient is not currently taking opioid prescriptions. Functional ability and status Nutritional status Physical activity Advanced directives List of other physicians Hospitalizations, surgeries, and ER visits in previous 12 months Vitals Screenings to include cognitive, depression, and falls Referrals and appointments  In addition, I have reviewed and discussed with patient certain preventive protocols, quality metrics, and best practice recommendations. A written personalized care plan for preventive services as well as general preventive health recommendations were provided to patient.    Mr. RKaley, Thank you for taking time to come for your Medicare Wellness Visit. I appreciate your ongoing commitment to your health goals. Please review the following plan we discussed and let me know if I can assist you in the future.   These are the goals we discussed:  Goals      Increase water intake     Recommend increasing water intake to 6 glasses of water a day.      Prevent falls     Recommend to remove any items from the home that may cause slips or trips.     Stay Active and Independent         Why is this important?   Regular activity or exercise is important to managing back pain.  Activity helps to keep your muscles strong.  You will sleep better and feel more relaxed.  You will have more energy and feel less stressed.  If you are not active now, start slowly. Little changes make a big difference.  Rest, but not too much.  Stay as active as you can and listen to your body's signals.            This is a list of the screening recommended for you and due dates:  Health Maintenance  Topic Date Due   COVID-19  Vaccine (6 - Pfizer risk series)  04/25/2021   Hemoglobin A1C  02/14/2022   Tetanus Vaccine  04/04/2022   Yearly kidney function blood test for diabetes  05/05/2022   Yearly kidney health urinalysis for diabetes  05/05/2022   Complete foot exam   09/12/2022   Eye exam for diabetics  10/28/2022   Colon Cancer Screening  02/01/2024   Pneumonia Vaccine  Completed   Flu Shot  Completed   Zoster (Shingles) Vaccine  Completed   HPV Vaccine  Aged [address] [place]   03/05/2022   Nurse Notes: Approximately 30 minute Face to Face visit

## 2022-03-16 ENCOUNTER — Telehealth: Payer: Self-pay

## 2022-03-16 NOTE — Telephone Encounter (Signed)
History and specimen tracking updated per MOHs progress note. 

## 2022-03-29 DIAGNOSIS — Z23 Encounter for immunization: Secondary | ICD-10-CM | POA: Diagnosis not present

## 2022-04-09 DIAGNOSIS — E1142 Type 2 diabetes mellitus with diabetic polyneuropathy: Secondary | ICD-10-CM | POA: Diagnosis not present

## 2022-04-09 DIAGNOSIS — B351 Tinea unguium: Secondary | ICD-10-CM | POA: Diagnosis not present

## 2022-04-10 ENCOUNTER — Ambulatory Visit: Payer: Self-pay | Admitting: *Deleted

## 2022-04-10 NOTE — Telephone Encounter (Signed)
Summary: Diarrhea advise   Pt is calling to report he having long term diarrhea while taking metFORMIN (GLUCOPHAGE) 1000 MG tablet [[id number]]  DISCONTINUED. Pt would like to know if would be a good idea if Dr. Ancil Boozer prescribes him Colestipol to assist with the diarrhea? Please advise        Chief Complaint: requesting medication due to constant diarrhea  Symptoms: long term diarrhea after taking metformin. Reports mild diarrhea but would like to take medication colestipol. Reports his brother was started on medication and has same sx from metformin and was started on colestipol and decreased diarrhea. Patient aware medication is to treat cholesterol.  Frequency: since taking metformin long term Pertinent Negatives: Patient denies signs of dehydration. No N/V. No fever reported.  Disposition: '[]'$ ED /'[]'$ Urgent Care (no appt availability in office) / '[]'$ Appointment(In office/virtual)/ '[]'$  [place] [facility]/ '[]'$ Home Care/ '[]'$ Refused Recommended Disposition /'[]'$ [place] [facility]/ '[x]'$  Follow-up with PCP Additional Notes:   Appt scheduled 05/06/22 and would like to be prescribed medication colestipol and discuss at future visit if medication helps with diarrhea. Reports up date on medication dosages. Reports he is now taking glipizide 5 mg  daily , tresiba decreased to 40 units every morning. Please advise.    Reason for Disposition  Diarrhea is a chronic symptom (recurrent or ongoing AND present > 4 weeks)  Answer Assessment - Initial Assessment Questions 1. DIARRHEA SEVERITY: "How bad is the diarrhea?" "How many more stools have you had in the past 24 hours than normal?"    - NO DIARRHEA (SCALE 0)   - MILD (SCALE 1-3): Few loose or mushy BMs; increase of 1-3 stools over normal daily number of stools; mild increase in ostomy output.   -  MODERATE (SCALE 4-7): Increase of 4-6 stools daily over normal; moderate increase in ostomy output.   -  SEVERE (SCALE 8-10; OR "WORST POSSIBLE"): Increase  of 7 or more stools daily over normal; moderate increase in ostomy output; incontinence.     Mild  diarrhea noted after taking metformin 2. ONSET: "When did the diarrhea begin?"      On going since starting metformin 3. BM CONSISTENCY: "How loose or watery is the diarrhea?"      na 4. VOMITING: "Are you also vomiting?" If Yes, ask: "How many times in the past 24 hours?"      no 5. ABDOMEN PAIN: "Are you having any abdomen pain?" If Yes, ask: "What does it feel like?" (e.g., crampy, dull, intermittent, constant)      no 6. ABDOMEN PAIN SEVERITY: If present, ask: "How bad is the pain?"  (e.g., Scale 1-10; mild, moderate, or severe)   - MILD (1-3): doesn't interfere with normal activities, abdomen soft and not tender to touch    - MODERATE (4-7): interferes with normal activities or awakens from sleep, abdomen tender to touch    - SEVERE (8-10): excruciating pain, doubled over, unable to do any normal activities       na 7. ORAL INTAKE: If vomiting, "Have you been able to drink liquids?" "How much liquids have you had in the past 24 hours?"     Na  8. HYDRATION: "Any signs of dehydration?" (e.g., dry mouth [not just dry lips], too weak to stand, dizziness, new weight loss) "When did you last urinate?"     No  9. EXPOSURE: "Have you traveled to a foreign country recently?" "Have you been exposed to anyone with diarrhea?" "Could you have eaten any food that was spoiled?"  na 10. ANTIBIOTIC USE: "Are you taking antibiotics now or have you taken antibiotics in the past 2 months?"       na 11. OTHER SYMPTOMS: "Do you have any other symptoms?" (e.g., fever, blood in stool)       Long term diarrhea reported due to taking metformin 12. PREGNANCY: "Is there any chance you are pregnant?" "When was your last menstrual period?"       na  Protocols used: [facility]

## 2022-04-14 NOTE — Telephone Encounter (Signed)
Pt calling for message from Dr. Ancil Boozer. Advised Dr. Ancil Boozer recommended he call endocrinologist for med adjustments. Pt somewhat argumentative. States he just wants to try the Colestipol, "Might help might not." States "Dr. Ancil Boozer prescribed the metformin for me so why would I call the endocrinologist?"  Again reviewed the message from Dr. Ancil Boozer.   Would like follow up if she will or will not order med requested. Please advise further.  CB# [phone]

## 2022-04-29 DIAGNOSIS — H401122 Primary open-angle glaucoma, left eye, moderate stage: Secondary | ICD-10-CM | POA: Diagnosis not present

## 2022-05-01 ENCOUNTER — Other Ambulatory Visit: Payer: Self-pay | Admitting: Family Medicine

## 2022-05-01 DIAGNOSIS — I10 Essential (primary) hypertension: Secondary | ICD-10-CM

## 2022-05-05 NOTE — Progress Notes (Deleted)
Name: Jeremy [name]   MRN: [id number]    DOB: 08/06/1935   Date:05/05/2022       Progress Note  Subjective  Chief Complaint  Follow Up  HPI  DMII: he was diagnosed in the 73's during a hospital stay for small bowel obstruction. He was on oral medications for a long time, but has been on insulin for the past few years. Currently seeing Endocrinologist Dr. Honor Junes at [facility] . He denies polyphagia, polyuria or polydipsia. He is compliant and is up to date with eye exam - that is now showing some diabetic retinopathy.  No side effects of medications. He is on Tresiba  74 units per day, Jardiance and Metformin Last A1C done at Endo was 6.4% % . He also has a history of microalbuminuria and is on ARB, dyslipidemia and is on statin therapy    HTN: he denies chest pain, palpitation or dizziness He has good exercise tolerance, he is still able to work in his yard    Atherosclerosis of aorta:  found on CT scan done 07/2018, no side effects of Rosuvastatin last LDL at goal    GERD: had EGD done recently by Dr. Fleet Contras and no esophageal varices seen on EGD this time around, he has gastritis but controlled with PPI, discussed high risk use of PPI but he states not able to come off medication   Seborrhea: under the care of Dr. Nehemiah Massed, doing well on medication    Lower extremity edema he uses compression stocking hoses and it is controlling symptoms for him , no problems at this time, ulceration of leg resolved   Carpal tunnel syndrome: right wrist only, had surgery done by Dr. Sabra Heck about 10 years ago and has noticed tingling on middle fingers and sometimes right thumb, aggravated by driving or gardening, states symptoms are intermittent. He recently saw Dr. Sabra Heck and was advised to take naproxen and had steroid injection, explained nsaid's not good for him long term with personal history of gastritis and also history of microalbuminuria.   Basal cell carcinoma face: under the care of  Dr. Nehemiah Massed and waiting to have [place] surgery soon   Patient Active Problem List   Diagnosis Date Noted   Basal cell carcinoma ([place]) of skin of face 11/03/2021   Dyslipidemia associated with type 2 diabetes mellitus ([place]) 11/03/2021   Impingement syndrome of shoulder region 11/16/2018   Atherosclerosis of aorta ([place]) 07/21/2018   History of diverticulitis of colon 11/15/2017   Hyperlipidemia due to type 2 diabetes mellitus ([place]) 03/14/2017   Bilateral lower extremity edema 03/27/2016   Vitamin D deficiency 12/05/2015   Hx of transient ischemic attack (TIA) 09/17/2015   Diabetes mellitus type 2 without retinopathy ([place]) 05/31/2015   Hyperlipidemia 05/30/2015   Type 2 diabetes mellitus with renal manifestations ([place]) 01/08/2015   Incisional hernia, without obstruction or gangrene 03/23/2013   Enlarged prostate with lower urinary tract symptoms (LUTS) 11/03/2012   Myopic degeneration, bilateral 08/12/2012   Nonexudative age-related macular degeneration 07/15/2012   Anisometropia 04/08/2012   Pseudoaphakia 04/08/2012   History of retinal detachment 04/08/2012   Presence of intraocular lens 04/08/2012   Secondary open-angle glaucoma of left eye, severe stage 01/13/2012   Benign essential HTN 02/03/2007   Hypertension associated with diabetes ([place]) 02/03/2007    Past Surgical History:  Procedure Laterality Date   APPENDECTOMY     BLADDER DIVERTICULECTOMY  2013   COLON SURGERY Left 1980's   COLONOSCOPY W/ POLYPECTOMY  1990's   Dr  Crawford   COLONOSCOPY WITH PROPOFOL N/A 12/21/2014   Procedure: COLONOSCOPY WITH PROPOFOL;  Surgeon: Lollie Sails, MD;  Location: [facility] ENDOSCOPY;  Service: Endoscopy;  Laterality: N/A;   COLONOSCOPY WITH PROPOFOL N/A 08/16/2017   Procedure: COLONOSCOPY WITH PROPOFOL;  Surgeon: Lollie Sails, MD;  Location: [facility] ENDOSCOPY;  Service: Endoscopy;  Laterality: N/A;   COLONOSCOPY WITH PROPOFOL N/A 01/31/2021   Procedure: COLONOSCOPY WITH PROPOFOL;   Surgeon: Robert Bellow, MD;  Location: [facility] ENDOSCOPY;  Service: Endoscopy;  Laterality: N/A;   ESOPHAGOGASTRODUODENOSCOPY (EGD) WITH PROPOFOL N/A 02/15/2015   Procedure: ESOPHAGOGASTRODUODENOSCOPY (EGD) WITH PROPOFOL;  Surgeon: Lollie Sails, MD;  Location: [facility] ENDOSCOPY;  Service: Endoscopy;  Laterality: N/A;   ESOPHAGOGASTRODUODENOSCOPY (EGD) WITH PROPOFOL N/A 06/10/2017   Procedure: ESOPHAGOGASTRODUODENOSCOPY (EGD) WITH PROPOFOL;  Surgeon: Lollie Sails, MD;  Location: [facility] ENDOSCOPY;  Service: Endoscopy;  Laterality: N/A;   ESOPHAGOGASTRODUODENOSCOPY (EGD) WITH PROPOFOL N/A 08/16/2017   Procedure: ESOPHAGOGASTRODUODENOSCOPY (EGD) WITH PROPOFOL;  Surgeon: Lollie Sails, MD;  Location: [facility] ENDOSCOPY;  Service: Endoscopy;  Laterality: N/A;   ESOPHAGOGASTRODUODENOSCOPY (EGD) WITH PROPOFOL N/A 01/31/2021   Procedure: ESOPHAGOGASTRODUODENOSCOPY (EGD) WITH PROPOFOL;  Surgeon: Robert Bellow, MD;  Location: [facility] ENDOSCOPY;  Service: Endoscopy;  Laterality: N/A;   EYE SURGERY     GLAUCOMA SURGERY Left 79's   [facility]   HEMORROIDECTOMY  [phone]   [facility]   HERNIA REPAIR     [facility], Dr Alfredia Client HERNIA REPAIR Bilateral [phone]   LARYNX SURGERY  1990   3 times   RETINAL DETACHMENT SURGERY Right    TONSILLECTOMY     turp  2013   URETER SURGERY  2013   Dr Jacqlyn Larsen    Family History  Problem Relation Age of Onset   Diabetes Mother    Cataracts Mother    Metabolic syndrome Mother    Blindness Mother        r/t diabetes   Cataracts Father    Glaucoma Father        Retina-Detachment   Atrial fibrillation Father    Diverticulitis Sister    Glaucoma Brother    Diabetes Brother    Cancer Sister        Gallbladder   Cancer Daughter     Social History   Tobacco Use   Smoking status: Never   Smokeless tobacco: Never   Tobacco comments:    smoking cessation materials not required  Substance Use Topics   Alcohol use: No     Current Outpatient  Medications:    bisoprolol (ZEBETA) 10 MG tablet, Take 1 tablet by mouth once daily, Disp: 90 tablet, Rfl: 0   brimonidine (ALPHAGAN P) 0.1 % SOLN, Place 1 drop into the left eye 2 (two) times daily. , Disp: , Rfl:    desonide (DESOWEN) 0.05 % ointment, Apply to rash under eye QD-BID PRN, Disp: 15 g, Rfl: 1   dorzolamide (TRUSOPT) 2 % ophthalmic solution, Place 1 drop into the left eye 2 (two) times daily., Disp: , Rfl: 3   empagliflozin (JARDIANCE) 25 MG TABS tablet, Take 1 tablet by mouth daily., Disp: , Rfl:    Exenatide ER (BYDUREON BCISE) 2 MG/0.85ML AUIJ, Inject 2 mg into the skin once a week., Disp: , Rfl:    finasteride (PROSCAR) 5 MG tablet, Take 1 tablet (5 mg total) by mouth daily., Disp: 90 tablet, Rfl: 3   glipiZIDE (GLUCOTROL XL) 2.5 MG 24 hr tablet, Take 2.5 mg by mouth daily with  breakfast., Disp: , Rfl:    hydrocortisone 2.5 % cream, APPLY TO THE AFFECTED AREAS OF FACE AND EARS EVERY OTHER NIGHT. ALTERNATING WITH KETOCONAZOLE CREAM, Disp: 28 g, Rfl: 0   insulin degludec (TRESIBA FLEXTOUCH) 200 UNIT/ML FlexTouch Pen, Inject 45 Units into the skin in the morning., Disp: , Rfl:    ketoconazole (NIZORAL) 2 % cream, Apply to aa's face and ears every other night alternating with Hydrocortisone cream., Disp: 60 g, Rfl: 11   ketoconazole (NIZORAL) 2 % shampoo, Apply 1 application topically 3 (three) times a week. Shampoo scalp 3 days a week let sit 5 minutes and rinse off, Disp: 120 mL, Rfl: 11   metFORMIN (GLUCOPHAGE) 1000 MG tablet, Take 1,000 mg by mouth 2 (two) times daily with a meal., Disp: , Rfl:    mometasone (ELOCON) 0.1 % cream, Apply topically daily., Disp: 45 g, Rfl: 0   mometasone (ELOCON) 0.1 % lotion, APPLY TO THE AFFECTED AREAS OF SCALP THREE NIGHTS PER WEEK, Disp: 60 mL, Rfl: 0   naproxen sodium (ALEVE) 220 MG tablet, Take 1 tablet (220 mg total) by mouth daily as needed. (Patient not taking: Reported on 03/05/2022), Disp: 30 tablet, Rfl: 0   omega-3 acid ethyl esters  (LOVAZA) 1 g capsule, Take 2 capsules (2 g total) by mouth 2 (two) times daily., Disp: 480 capsule, Rfl: 3   omeprazole (PRILOSEC) 20 MG capsule, Take 1 capsule (20 mg total) by mouth daily., Disp: 90 capsule, Rfl: 1   rosuvastatin (CRESTOR) 10 MG tablet, Take 1 tablet (10 mg total) by mouth at bedtime., Disp: 90 tablet, Rfl: 1   tadalafil (CIALIS) 20 MG tablet, Take 0.5-1 tablets (10-20 mg total) by mouth every other day as needed for erectile dysfunction., Disp: 30 tablet, Rfl: 0   telmisartan-hydrochlorothiazide (MICARDIS HCT) 80-25 MG tablet, Take 1 tablet by mouth daily., Disp: 90 tablet, Rfl: 1   timolol (TIMOPTIC) 0.5 % ophthalmic solution, INSTILL 1 DROP INTO LEFT EYE TWICE DAILY, Disp: , Rfl:   Allergies  Allergen Reactions   Demerol [Meperidine] Other (See Comments) and Anaphylaxis    MAKES BLOOD PRESSURE BOTTOM OUT hypotension   Dilaudid [Hydromorphone Hcl]    Hydromorphone     Other reaction(s): Unknown MAKES BLOOD PRESSURE BOTTOM OUT   Meperidine Hcl    Actos [Pioglitazone] Other (See Comments) and Hives   Dapagliflozin Rash   Penicillins Rash    I personally reviewed active problem list, medication list, allergies, family history, social history, health maintenance with the patient/caregiver today.   ROS  ***  Objective  There were no vitals filed for this visit.  There is no height or weight on file to calculate BMI.  Physical Exam ***  No results found for this or any previous visit (from the past 2160 hour(s)).   PHQ2/9:    01/28/2022   11:03 AM 11/03/2021    1:49 PM 05/05/2021    1:23 PM 03/04/2021    1:57 PM 10/29/2020    1:22 PM  Depression screen PHQ 2/9  Decreased Interest 0 0 0 0 0  Down, Depressed, Hopeless 0 0 0 0 0  PHQ - 2 Score 0 0 0 0 0  Altered sleeping  0 0    Tired, decreased energy  0 0    Change in appetite  0 0    Feeling bad or failure about yourself   0 0    Trouble concentrating  0 0    Moving slowly or fidgety/restless  0 0  Suicidal thoughts  0 0    PHQ-9 Score  0 0      phq 9 is {gen pos [id number]}   Fall Risk:    03/05/2022    2:05 PM 01/28/2022   10:57 AM 11/03/2021    1:49 PM 05/05/2021    1:23 PM 03/04/2021    1:58 PM  Fall Risk   Falls in the past year? 0 0 0 0 0  Number falls in past yr: 0 0 0 0 0  Injury with Fall? 0 0 0 0 0  Risk for fall due to : No Fall Risks No Fall Risks No Fall Risks No Fall Risks No Fall Risks  Follow up Falls evaluation completed Falls prevention discussed Falls prevention discussed Falls prevention discussed Falls prevention discussed      Functional Status Survey:      Assessment & Plan  *** There are no diagnoses linked to this encounter.

## 2022-05-06 ENCOUNTER — Ambulatory Visit: Payer: Medicare Other | Admitting: Family Medicine

## 2022-05-06 DIAGNOSIS — E1169 Type 2 diabetes mellitus with other specified complication: Secondary | ICD-10-CM

## 2022-05-14 ENCOUNTER — Other Ambulatory Visit: Payer: Self-pay | Admitting: Family Medicine

## 2022-05-14 DIAGNOSIS — R42 Dizziness and giddiness: Secondary | ICD-10-CM | POA: Diagnosis not present

## 2022-05-14 DIAGNOSIS — I10 Essential (primary) hypertension: Secondary | ICD-10-CM

## 2022-05-14 DIAGNOSIS — R002 Palpitations: Secondary | ICD-10-CM | POA: Diagnosis not present

## 2022-05-28 ENCOUNTER — Other Ambulatory Visit: Payer: Self-pay | Admitting: Internal Medicine

## 2022-05-28 ENCOUNTER — Encounter: Payer: Self-pay | Admitting: Internal Medicine

## 2022-05-28 DIAGNOSIS — Z794 Long term (current) use of insulin: Secondary | ICD-10-CM | POA: Diagnosis not present

## 2022-05-28 DIAGNOSIS — R9389 Abnormal findings on diagnostic imaging of other specified body structures: Secondary | ICD-10-CM | POA: Diagnosis not present

## 2022-05-28 DIAGNOSIS — I152 Hypertension secondary to endocrine disorders: Secondary | ICD-10-CM | POA: Diagnosis not present

## 2022-05-28 DIAGNOSIS — E1159 Type 2 diabetes mellitus with other circulatory complications: Secondary | ICD-10-CM | POA: Diagnosis not present

## 2022-05-28 DIAGNOSIS — E1142 Type 2 diabetes mellitus with diabetic polyneuropathy: Secondary | ICD-10-CM | POA: Diagnosis not present

## 2022-06-02 NOTE — Progress Notes (Unsigned)
Name: Jeremy [name]   MRN: [id number]    DOB: Jan 28, 1936   Date:06/03/2022       Progress Note  Subjective  Chief Complaint  Follow Up  HPI  DMII: he was diagnosed in the 90's during a hospital stay for small bowel obstruction. He was on oral medications for a long time, but has been on insulin for the past few years. Currently seeing Endocrinologist Dr. Honor Junes at [facility], last A1C was 8.2 % back in Sept 2023, he is currently [place] 74 units also on Bydureon, glipizide , Metformin and Jardiance. He denies polyphagia, polyuria or polydipsia. He is compliant and is up to date with eye exam - that is now showing some diabetic retinopathy.  No side effects of medications. He also has a history of microalbuminuria and is on ARB, dyslipidemia and is on statin therapy He denies recent hypoglycemic episodes   Thrombocytopenia: we will recheck CBC  Malnutrition: he has lost 43.5 lbs in the past year. He states he is happy with weight loss. He states he started to lose weight with Jardiance. Discussed importance of not losing any more weight.   HTN: BP has been low, he had some episodes of dizziness, we will decrease dose of micardis HCTZ from 80/25 mg to 80/12.5 mg , he asked to stay on current dose but explained not safe and we will titrate dose down slowly    Atherosclerosis of aorta:  found on CT scan done 07/2018, no side effects of Rosuvastatin last LDL at goal , we will recheck levels.    GERD: had EGD done recently by Dr. Fleet Contras and no esophageal varices seen on EGD this time around, he has gastritis but controlled with PPI, discussed high risk use of PPI but he states not able to come off medication, recently seen by GI because he is concerned about long time side effects of Finasteride, he will have US liver done tomorrow.   Seborrhea: under the care of Dr. Nehemiah Massed, doing well on medication    Lower extremity edema he uses compression stocking hoses and it is controlling  symptoms for him  Carpal tunnel syndrome: s/p revision on right side and doing a little better   Basal cell carcinoma face: under the care of Dr. Nehemiah Massed and waiting he had Moh's surgery on face and is doing better   Low B12 : we will recheck level , he takes metformin   Patient Active Problem List   Diagnosis Date Noted  . Thrombocytopenia ([place]) 06/03/2022  . B12 deficiency 06/03/2022  . Basal cell carcinoma (BCC) of skin of face 11/03/2021  . Dyslipidemia associated with type 2 diabetes mellitus ([place]) 11/03/2021  . Impingement syndrome of shoulder region 11/16/2018  . Atherosclerosis of aorta ([place]) 07/21/2018  . History of diverticulitis of colon 11/15/2017  . Hyperlipidemia due to type 2 diabetes mellitus ([place]) 03/14/2017  . Bilateral lower extremity edema 03/27/2016  . Vitamin D deficiency 12/05/2015  . Hx of transient ischemic attack (TIA) 09/17/2015  . Diabetes mellitus type 2 without retinopathy ([place]) 05/31/2015  . Hyperlipidemia 05/30/2015  . Type 2 diabetes mellitus with renal manifestations ([place]) 01/08/2015  . Incisional hernia, without obstruction or gangrene 03/23/2013  . Enlarged prostate with lower urinary tract symptoms (LUTS) 11/03/2012  . Myopic degeneration, bilateral 08/12/2012  . Nonexudative age-related macular degeneration 07/15/2012  . Anisometropia 04/08/2012  . Pseudoaphakia 04/08/2012  . History of retinal detachment 04/08/2012  . Presence of intraocular lens 04/08/2012  . Secondary open-angle glaucoma  of left eye, severe stage 01/13/2012  . Benign essential HTN 02/03/2007  . Hypertension associated with diabetes ([place]) 02/03/2007    Past Surgical History:  Procedure Laterality Date  . APPENDECTOMY    . BLADDER DIVERTICULECTOMY  2013  . CARPAL TUNNEL RELEASE  01/2022   revision right side  . COLON SURGERY Left 1980's  . COLONOSCOPY W/ POLYPECTOMY  1990's   Dr Sharlet Salina  . COLONOSCOPY WITH PROPOFOL N/A 12/21/2014   Procedure: COLONOSCOPY WITH  PROPOFOL;  Surgeon: Lollie Sails, MD;  Location: [facility] ENDOSCOPY;  Service: Endoscopy;  Laterality: N/A;  . COLONOSCOPY WITH PROPOFOL N/A 08/16/2017   Procedure: COLONOSCOPY WITH PROPOFOL;  Surgeon: Lollie Sails, MD;  Location: [facility] ENDOSCOPY;  Service: Endoscopy;  Laterality: N/A;  . COLONOSCOPY WITH PROPOFOL N/A 01/31/2021   Procedure: COLONOSCOPY WITH PROPOFOL;  Surgeon: Robert Bellow, MD;  Location: [facility] ENDOSCOPY;  Service: Endoscopy;  Laterality: N/A;  . ESOPHAGOGASTRODUODENOSCOPY (EGD) WITH PROPOFOL N/A 02/15/2015   Procedure: ESOPHAGOGASTRODUODENOSCOPY (EGD) WITH PROPOFOL;  Surgeon: Lollie Sails, MD;  Location: [facility] ENDOSCOPY;  Service: Endoscopy;  Laterality: N/A;  . ESOPHAGOGASTRODUODENOSCOPY (EGD) WITH PROPOFOL N/A 06/10/2017   Procedure: ESOPHAGOGASTRODUODENOSCOPY (EGD) WITH PROPOFOL;  Surgeon: Lollie Sails, MD;  Location: [facility] ENDOSCOPY;  Service: Endoscopy;  Laterality: N/A;  . ESOPHAGOGASTRODUODENOSCOPY (EGD) WITH PROPOFOL N/A 08/16/2017   Procedure: ESOPHAGOGASTRODUODENOSCOPY (EGD) WITH PROPOFOL;  Surgeon: Lollie Sails, MD;  Location: [facility] ENDOSCOPY;  Service: Endoscopy;  Laterality: N/A;  . ESOPHAGOGASTRODUODENOSCOPY (EGD) WITH PROPOFOL N/A 01/31/2021   Procedure: ESOPHAGOGASTRODUODENOSCOPY (EGD) WITH PROPOFOL;  Surgeon: Robert Bellow, MD;  Location: [facility] ENDOSCOPY;  Service: Endoscopy;  Laterality: N/A;  . EYE SURGERY    . GLAUCOMA SURGERY Left [place]     [place], Dr Sharlet Salina  . INGUINAL HERNIA REPAIR Bilateral 1960's  . LARYNX SURGERY  1990   3 times  . MOHS SURGERY  01/22/2022   Dr. Purcell Nails - [place]  . RETINAL DETACHMENT SURGERY Right   . TONSILLECTOMY    . turp  2013  . URETER SURGERY  2013   Dr Jacqlyn Larsen    Family History  Problem Relation Age of Onset  . Diabetes Mother   . Cataracts Mother   . Metabolic syndrome Mother   . Blindness Mother         r/t diabetes  . Cataracts Father   . Glaucoma Father        Retina-Detachment  . Atrial fibrillation Father   . Diverticulitis Sister   . Glaucoma Brother   . Diabetes Brother   . Cancer Sister        Gallbladder  . Cancer Daughter     Social History   Tobacco Use  . Smoking status: Never  . Smokeless tobacco: Never  . Tobacco comments:    smoking cessation materials not required  Substance Use Topics  . Alcohol use: No     Current Outpatient Medications:  .  brimonidine (ALPHAGAN P) 0.1 % SOLN, Place 1 drop into the left eye 2 (two) times daily. , Disp: , Rfl:  .  desonide (DESOWEN) 0.05 % ointment, Apply to rash under eye QD-BID PRN, Disp: 15 g, Rfl: 1 .  dorzolamide (TRUSOPT) 2 % ophthalmic solution, Place 1 drop into the left eye 2 (two) times daily., Disp: , Rfl: 3 .  Exenatide ER (BYDUREON BCISE) 2 MG/0.85ML AUIJ, Inject 2 mg into the skin once  a week., Disp: , Rfl:  .  finasteride (PROSCAR) 5 MG tablet, Take 1 tablet (5 mg total) by mouth daily., Disp: 90 tablet, Rfl: 3 .  glipiZIDE (GLUCOTROL XL) 2.5 MG 24 hr tablet, Take 2.5 mg by mouth daily with breakfast., Disp: , Rfl:  .  hydrocortisone 2.5 % cream, APPLY TO THE AFFECTED AREAS OF FACE AND EARS EVERY OTHER NIGHT. ALTERNATING WITH KETOCONAZOLE CREAM, Disp: 28 g, Rfl: 0 .  insulin degludec (TRESIBA FLEXTOUCH) 200 UNIT/ML FlexTouch Pen, Inject 45 Units into the skin in the morning., Disp: , Rfl:  .  ketoconazole (NIZORAL) 2 % cream, Apply to aa's face and ears every other night alternating with Hydrocortisone cream., Disp: 60 g, Rfl: 11 .  ketoconazole (NIZORAL) 2 % shampoo, Apply 1 application topically 3 (three) times a week. Shampoo scalp 3 days a week let sit 5 minutes and rinse off, Disp: 120 mL, Rfl: 11 .  metFORMIN (GLUCOPHAGE) 1000 MG tablet, Take 1,000 mg by mouth 2 (two) times daily with a meal., Disp: , Rfl:  .  mometasone (ELOCON) 0.1 % cream, Apply topically daily., Disp: 45 g, Rfl: 0 .  mometasone (ELOCON)  0.1 % lotion, APPLY TO THE AFFECTED AREAS OF SCALP THREE NIGHTS PER WEEK, Disp: 60 mL, Rfl: 0 .  omeprazole (PRILOSEC) 20 MG capsule, Take 1 capsule (20 mg total) by mouth daily., Disp: 90 capsule, Rfl: 1 .  telmisartan-hydrochlorothiazide (MICARDIS HCT) 80-12.5 MG tablet, Take 1 tablet by mouth daily., Disp: 90 tablet, Rfl: 0 .  timolol (TIMOPTIC) 0.5 % ophthalmic solution, INSTILL 1 DROP INTO LEFT EYE TWICE DAILY, Disp: , Rfl:  .  bisoprolol (ZEBETA) 10 MG tablet, Take 1 tablet (10 mg total) by mouth daily., Disp: 90 tablet, Rfl: 1 .  omega-3 acid ethyl esters (LOVAZA) 1 g capsule, Take 2 capsules (2 g total) by mouth 2 (two) times daily., Disp: 480 capsule, Rfl: 3 .  rosuvastatin (CRESTOR) 10 MG tablet, Take 1 tablet (10 mg total) by mouth at bedtime., Disp: 90 tablet, Rfl: 1 .  tadalafil (CIALIS) 20 MG tablet, Take 0.5-1 tablets (10-20 mg total) by mouth every other day as needed for erectile dysfunction., Disp: 30 tablet, Rfl: 0  Allergies  Allergen Reactions  . Demerol [Meperidine] Other (See Comments) and Anaphylaxis    MAKES BLOOD PRESSURE BOTTOM OUT hypotension  . Dilaudid [Hydromorphone Hcl]   . Hydromorphone     Other reaction(s): Unknown MAKES BLOOD PRESSURE BOTTOM OUT  . Meperidine Hcl   . Actos [Pioglitazone] Other (See Comments) and Hives  . Dapagliflozin Rash  . Penicillins Rash    I personally reviewed active problem list, medication list, allergies, family history, social history, health maintenance with the patient/caregiver today.   ROS  Constitutional: Negative for fever , positive for  weight change.  Respiratory: Negative for cough and shortness of breath.   Cardiovascular: Negative for chest pain or palpitations.  Gastrointestinal: Negative for abdominal pain, no bowel changes.  Musculoskeletal: Negative for gait problem or joint swelling.  Skin: Negative for rash.  Neurological: Negative for dizziness or headache.  No other specific complaints in a  complete review of systems (except as listed in HPI above).   Objective  Vitals:   06/03/22 1313  BP: 118/64  Pulse: 70  Resp: 16  Temp: 98.1 F (36.7 C)  TempSrc: Oral  SpO2: 98%  Weight: 209 lb 8 oz (95 kg)  Height: 6' 3.5" (1.918 m)    Body mass index is 25.84 kg/m.  Physical Exam  Constitutional: Patient appears well-developed and well-nourished. Malnutrition, temporal waisting   No distress.  HEENT: head atraumatic, normocephalic, pupils equal and reactive to light, neck supple, throat within normal limits Cardiovascular: Normal rate, regular rhythm and normal heart sounds.  No murmur heard. No BLE edema. Pulmonary/Chest: Effort normal and breath sounds normal. No respiratory distress. Abdominal: Soft.  There is no tenderness. Psychiatric: Patient has a normal mood and affect. behavior is normal. Judgment and thought content normal.    PHQ2/9:    06/03/2022    1:16 PM 01/28/2022   11:03 AM 11/03/2021    1:49 PM 05/05/2021    1:23 PM 03/04/2021    1:57 PM  Depression screen PHQ 2/9  Decreased Interest 0 0 0 0 0  Down, Depressed, Hopeless 0 0 0 0 0  PHQ - 2 Score 0 0 0 0 0  Altered sleeping 0  0 0   Tired, decreased energy 0  0 0   Change in appetite 0  0 0   Feeling bad or failure about yourself  0  0 0   Trouble concentrating 0  0 0   Moving slowly or fidgety/restless 0  0 0   Suicidal thoughts 0  0 0   PHQ-9 Score 0  0 0     phq 9 is negative   Fall Risk:    06/03/2022    1:15 PM 03/05/2022    2:05 PM 01/28/2022   10:57 AM 11/03/2021    1:49 PM 05/05/2021    1:23 PM  Fall Risk   Falls in the past year? 0 0 0 0 0  Number falls in past yr:  0 0 0 0  Injury with Fall?  0 0 0 0  Risk for fall due to : No Fall Risks No Fall Risks No Fall Risks No Fall Risks No Fall Risks  Follow up Falls prevention discussed;Education provided;Falls evaluation completed Falls evaluation completed Falls prevention discussed Falls prevention discussed Falls prevention  discussed      Functional Status Survey: Is the patient deaf or have difficulty hearing?: No Does the patient have difficulty seeing, even when wearing glasses/contacts?: No Does the patient have difficulty concentrating, remembering, or making decisions?: No Does the patient have difficulty walking or climbing stairs?: No Does the patient have difficulty dressing or bathing?: No Does the patient have difficulty doing errands alone such as visiting a doctor's office or shopping?: No    Assessment & Plan  1. Dyslipidemia associated with type 2 diabetes mellitus (HCC)  - Urine Microalbumin w/creat. ratio - COMPLETE METABOLIC PANEL WITH GFR - Lipid panel - omega-3 acid ethyl esters (LOVAZA) 1 g capsule; Take 2 capsules (2 g total) by mouth 2 (two) times daily.  Dispense: 480 capsule; Refill: 3 - rosuvastatin (CRESTOR) 10 MG tablet; Take 1 tablet (10 mg total) by mouth at bedtime.  Dispense: 90 tablet; Refill: 1  2. Hypertension associated with diabetes ([place])  - telmisartan-hydrochlorothiazide (MICARDIS HCT) 80-12.5 MG tablet; Take 1 tablet by mouth daily.  Dispense: 90 tablet; Refill: 0  3. Atherosclerosis of aorta ([place])  On statin  4. Type 2 diabetes mellitus with microalbuminuria, with long-term current use of insulin (HCC)  - telmisartan-hydrochlorothiazide (MICARDIS HCT) 80-12.5 MG tablet; Take 1 tablet by mouth daily.  Dispense: 90 tablet; Refill: 0  5. Thrombocytopenia ([place])  Recheck today   6. Senile purpura ([place])  Reassurance given   7. Mild protein-calorie malnutrition ([place])  Advised to make sure having  enough protein in his diet   8. B12 deficiency  Continue supplementation   9. Mixed hyperlipidemia  - omega-3 acid ethyl esters (LOVAZA) 1 g capsule; Take 2 capsules (2 g total) by mouth 2 (two) times daily.  Dispense: 480 capsule; Refill: 3 - rosuvastatin (CRESTOR) 10 MG tablet; Take 1 tablet (10 mg total) by mouth at bedtime.  Dispense: 90 tablet;  Refill: 1  10. ED (erectile dysfunction) of organic origin  - tadalafil (CIALIS) 20 MG tablet; Take 0.5-1 tablets (10-20 mg total) by mouth every other day as needed for erectile dysfunction.  Dispense: 30 tablet; Refill: 0  11. Benign essential HTN  - B12 and Folate Panel - CBC with Differential/Platelet - bisoprolol (ZEBETA) 10 MG tablet; Take 1 tablet (10 mg total) by mouth daily.  Dispense: 90 tablet; Refill: 1  12. Benign prostatic hyperplasia without lower urinary tract symptoms  - tadalafil (CIALIS) 20 MG tablet; Take 0.5-1 tablets (10-20 mg total) by mouth every other day as needed for erectile dysfunction.  Dispense: 30 tablet; Refill: 0  13. Family history of Lynch syndrome  Daughter, up to date with colonoscopy

## 2022-06-03 ENCOUNTER — Ambulatory Visit (INDEPENDENT_AMBULATORY_CARE_PROVIDER_SITE_OTHER): Payer: Medicare Other | Admitting: Family Medicine

## 2022-06-03 ENCOUNTER — Encounter: Payer: Self-pay | Admitting: Family Medicine

## 2022-06-03 VITALS — BP 118/64 | HR 70 | Temp 98.1°F | Resp 16 | Ht 75.5 in | Wt 209.5 lb

## 2022-06-03 DIAGNOSIS — I700 Atherosclerosis of aorta: Secondary | ICD-10-CM

## 2022-06-03 DIAGNOSIS — E441 Mild protein-calorie malnutrition: Secondary | ICD-10-CM

## 2022-06-03 DIAGNOSIS — E1159 Type 2 diabetes mellitus with other circulatory complications: Secondary | ICD-10-CM | POA: Diagnosis not present

## 2022-06-03 DIAGNOSIS — E1129 Type 2 diabetes mellitus with other diabetic kidney complication: Secondary | ICD-10-CM | POA: Diagnosis not present

## 2022-06-03 DIAGNOSIS — Z800 Family history of malignant neoplasm of digestive organs: Secondary | ICD-10-CM

## 2022-06-03 DIAGNOSIS — Z794 Long term (current) use of insulin: Secondary | ICD-10-CM

## 2022-06-03 DIAGNOSIS — I10 Essential (primary) hypertension: Secondary | ICD-10-CM | POA: Diagnosis not present

## 2022-06-03 DIAGNOSIS — E538 Deficiency of other specified B group vitamins: Secondary | ICD-10-CM | POA: Diagnosis not present

## 2022-06-03 DIAGNOSIS — E785 Hyperlipidemia, unspecified: Secondary | ICD-10-CM | POA: Diagnosis not present

## 2022-06-03 DIAGNOSIS — D692 Other nonthrombocytopenic purpura: Secondary | ICD-10-CM

## 2022-06-03 DIAGNOSIS — E1169 Type 2 diabetes mellitus with other specified complication: Secondary | ICD-10-CM | POA: Diagnosis not present

## 2022-06-03 DIAGNOSIS — I152 Hypertension secondary to endocrine disorders: Secondary | ICD-10-CM

## 2022-06-03 DIAGNOSIS — D696 Thrombocytopenia, unspecified: Secondary | ICD-10-CM | POA: Diagnosis not present

## 2022-06-03 DIAGNOSIS — R002 Palpitations: Secondary | ICD-10-CM | POA: Diagnosis not present

## 2022-06-03 DIAGNOSIS — N400 Enlarged prostate without lower urinary tract symptoms: Secondary | ICD-10-CM

## 2022-06-03 DIAGNOSIS — E782 Mixed hyperlipidemia: Secondary | ICD-10-CM | POA: Diagnosis not present

## 2022-06-03 DIAGNOSIS — N529 Male erectile dysfunction, unspecified: Secondary | ICD-10-CM | POA: Diagnosis not present

## 2022-06-03 DIAGNOSIS — R809 Proteinuria, unspecified: Secondary | ICD-10-CM

## 2022-06-03 MED ORDER — ROSUVASTATIN CALCIUM 10 MG PO TABS: 10.0000 mg | ORAL_TABLET | Freq: Every day | ORAL | 1 refills | Status: DC

## 2022-06-03 MED ORDER — TELMISARTAN-HCTZ 80-12.5 MG PO TABS: 1.0000 | ORAL_TABLET | Freq: Every day | ORAL | 0 refills | Status: DC

## 2022-06-03 MED ORDER — OMEGA-3-ACID ETHYL ESTERS 1 G PO CAPS: 2.0000 | ORAL_CAPSULE | Freq: Two times a day (BID) | ORAL | 3 refills | Status: DC

## 2022-06-03 MED ORDER — BISOPROLOL FUMARATE 10 MG PO TABS: 10.0000 mg | ORAL_TABLET | Freq: Every day | ORAL | 1 refills | Status: DC

## 2022-06-03 MED ORDER — TADALAFIL 20 MG PO TABS: 10.0000 mg | ORAL_TABLET | ORAL | 0 refills | Status: DC | PRN

## 2022-06-04 ENCOUNTER — Other Ambulatory Visit: Payer: Self-pay | Admitting: Internal Medicine

## 2022-06-04 ENCOUNTER — Ambulatory Visit
Admission: RE | Admit: 2022-06-04 | Discharge: 2022-06-04 | Disposition: A | Discharge status: Home / Self Care | Payer: Medicare Other | Source: Ambulatory Visit | Attending: Internal Medicine | Admitting: Internal Medicine

## 2022-06-04 ENCOUNTER — Encounter: Payer: Self-pay | Admitting: Internal Medicine

## 2022-06-04 DIAGNOSIS — K7460 Unspecified cirrhosis of liver: Secondary | ICD-10-CM | POA: Diagnosis not present

## 2022-06-04 DIAGNOSIS — R9389 Abnormal findings on diagnostic imaging of other specified body structures: Secondary | ICD-10-CM

## 2022-06-04 DIAGNOSIS — K766 Portal hypertension: Secondary | ICD-10-CM

## 2022-06-04 DIAGNOSIS — K760 Fatty (change of) liver, not elsewhere classified: Secondary | ICD-10-CM | POA: Diagnosis not present

## 2022-06-04 LAB — COMPLETE METABOLIC PANEL WITH GFR
AG Ratio: 1.7 (calc) (ref 1.0–2.5)
ALT: 25 U/L (ref 9–46)
AST: 24 U/L (ref 10–35)
Albumin: 4 g/dL (ref 3.6–5.1)
Alkaline phosphatase (APISO): 56 U/L (ref 35–144)
BUN/Creatinine Ratio: 28 (calc) — ABNORMAL HIGH (ref 6–22)
BUN: 19 mg/dL (ref 7–25)
CO2: 31 mmol/L (ref 20–32)
Calcium: 9.1 mg/dL (ref 8.6–10.3)
Chloride: 105 mmol/L (ref 98–110)
Creat: 0.68 mg/dL — ABNORMAL LOW (ref 0.70–1.22)
Globulin: 2.4 g/dL (calc) (ref 1.9–3.7)
Glucose, Bld: 149 mg/dL — ABNORMAL HIGH (ref 65–99)
Potassium: 4.9 mmol/L (ref 3.5–5.3)
Sodium: 142 mmol/L (ref 135–146)
Total Bilirubin: 1 mg/dL (ref 0.2–1.2)
Total Protein: 6.4 g/dL (ref 6.1–8.1)
eGFR: 92 mL/min/{1.73_m2} (ref >=60)

## 2022-06-04 LAB — LIPID PANEL
Cholesterol: 68 mg/dL (ref <200)
HDL: 41 mg/dL (ref >=40)
LDL Cholesterol (Calc): 11 mg/dL (calc)
Non-HDL Cholesterol (Calc): 27 mg/dL (calc) (ref <130)
Total CHOL/HDL Ratio: 1.7 (calc) (ref <5.0)
Triglycerides: 75 mg/dL (ref <150)

## 2022-06-04 LAB — CBC WITH DIFFERENTIAL/PLATELET
Absolute Monocytes: 598 cells/uL (ref 200–950)
Basophils Absolute: 22 cells/uL (ref 0–200)
Basophils Relative: 0.5 %
Eosinophils Absolute: 151 cells/uL (ref 15–500)
Eosinophils Relative: 3.5 %
HCT: 40.8 % (ref 38.5–50.0)
Hemoglobin: 14.5 g/dL (ref 13.2–17.1)
Lymphs Abs: 1866 cells/uL (ref 850–3900)
MCH: 36.5 pg — ABNORMAL HIGH (ref 27.0–33.0)
MCHC: 35.5 g/dL (ref 32.0–36.0)
MCV: 102.8 fL — ABNORMAL HIGH (ref 80.0–100.0)
MPV: 10.8 fL (ref 7.5–12.5)
Monocytes Relative: 13.9 %
Neutro Abs: 1664 cells/uL (ref 1500–7800)
Neutrophils Relative %: 38.7 %
Platelets: 138 10*3/uL — ABNORMAL LOW (ref 140–400)
RBC: 3.97 10*6/uL — ABNORMAL LOW (ref 4.20–5.80)
RDW: 12 % (ref 11.0–15.0)
Total Lymphocyte: 43.4 %
WBC: 4.3 10*3/uL (ref 3.8–10.8)

## 2022-06-04 LAB — MICROALBUMIN / CREATININE URINE RATIO
Creatinine, Urine: 52 mg/dL (ref 20–320)
Microalb Creat Ratio: 10 mcg/mg creat (ref <30)
Microalb, Ur: 0.5 mg/dL

## 2022-06-04 LAB — B12 AND FOLATE PANEL
Folate: >24 ng/mL
Vitamin B-12: >2000 pg/mL — ABNORMAL HIGH (ref 200–1100)

## 2022-06-09 ENCOUNTER — Other Ambulatory Visit: Payer: Self-pay

## 2022-06-09 DIAGNOSIS — D696 Thrombocytopenia, unspecified: Secondary | ICD-10-CM

## 2022-06-10 ENCOUNTER — Other Ambulatory Visit: Payer: Self-pay | Admitting: Internal Medicine

## 2022-06-10 DIAGNOSIS — K769 Liver disease, unspecified: Secondary | ICD-10-CM

## 2022-06-10 DIAGNOSIS — R1900 Intra-abdominal and pelvic swelling, mass and lump, unspecified site: Secondary | ICD-10-CM

## 2022-06-11 DIAGNOSIS — K7460 Unspecified cirrhosis of liver: Secondary | ICD-10-CM | POA: Diagnosis not present

## 2022-06-11 DIAGNOSIS — Z8673 Personal history of transient ischemic attack (TIA), and cerebral infarction without residual deficits: Secondary | ICD-10-CM | POA: Diagnosis not present

## 2022-06-11 DIAGNOSIS — Z794 Long term (current) use of insulin: Secondary | ICD-10-CM | POA: Diagnosis not present

## 2022-06-11 DIAGNOSIS — R0602 Shortness of breath: Secondary | ICD-10-CM | POA: Diagnosis not present

## 2022-06-11 DIAGNOSIS — E1159 Type 2 diabetes mellitus with other circulatory complications: Secondary | ICD-10-CM | POA: Diagnosis not present

## 2022-06-11 DIAGNOSIS — E785 Hyperlipidemia, unspecified: Secondary | ICD-10-CM | POA: Diagnosis not present

## 2022-06-11 DIAGNOSIS — R0789 Other chest pain: Secondary | ICD-10-CM | POA: Diagnosis not present

## 2022-06-11 DIAGNOSIS — I152 Hypertension secondary to endocrine disorders: Secondary | ICD-10-CM | POA: Diagnosis not present

## 2022-06-11 DIAGNOSIS — R002 Palpitations: Secondary | ICD-10-CM | POA: Diagnosis not present

## 2022-06-11 DIAGNOSIS — E1169 Type 2 diabetes mellitus with other specified complication: Secondary | ICD-10-CM | POA: Diagnosis not present

## 2022-06-11 DIAGNOSIS — K7689 Other specified diseases of liver: Secondary | ICD-10-CM | POA: Diagnosis not present

## 2022-06-11 DIAGNOSIS — E1142 Type 2 diabetes mellitus with diabetic polyneuropathy: Secondary | ICD-10-CM | POA: Diagnosis not present

## 2022-06-11 DIAGNOSIS — R1900 Intra-abdominal and pelvic swelling, mass and lump, unspecified site: Secondary | ICD-10-CM | POA: Diagnosis not present

## 2022-06-11 LAB — HEMOGLOBIN A1C: Hemoglobin A1C: 7.3

## 2022-06-12 ENCOUNTER — Ambulatory Visit
Admission: RE | Admit: 2022-06-12 | Discharge: 2022-06-12 | Disposition: A | Discharge status: Home / Self Care | Payer: Medicare Other | Source: Ambulatory Visit | Attending: Internal Medicine | Admitting: Internal Medicine

## 2022-06-12 DIAGNOSIS — N281 Cyst of kidney, acquired: Secondary | ICD-10-CM | POA: Diagnosis not present

## 2022-06-12 DIAGNOSIS — R1900 Intra-abdominal and pelvic swelling, mass and lump, unspecified site: Secondary | ICD-10-CM | POA: Insufficient documentation

## 2022-06-12 DIAGNOSIS — K769 Liver disease, unspecified: Secondary | ICD-10-CM | POA: Insufficient documentation

## 2022-06-12 DIAGNOSIS — N2889 Other specified disorders of kidney and ureter: Secondary | ICD-10-CM | POA: Diagnosis not present

## 2022-06-12 DIAGNOSIS — K3189 Other diseases of stomach and duodenum: Secondary | ICD-10-CM | POA: Diagnosis not present

## 2022-06-12 DIAGNOSIS — K8689 Other specified diseases of pancreas: Secondary | ICD-10-CM | POA: Diagnosis not present

## 2022-06-12 MED ORDER — GADOBUTROL 1 MMOL/ML IV SOLN
9.0000 mL | Freq: Once | INTRAVENOUS | Status: AC | PRN
  Administered 2022-06-12: 10 mL via INTRAVENOUS

## 2022-06-16 ENCOUNTER — Inpatient Hospital Stay: Payer: Medicare Other | Attending: Oncology | Admitting: Oncology

## 2022-06-16 ENCOUNTER — Encounter: Payer: Self-pay | Admitting: Oncology

## 2022-06-16 ENCOUNTER — Inpatient Hospital Stay: Payer: Medicare Other

## 2022-06-16 VITALS — BP 141/75 | HR 76 | Temp 97.0°F | Resp 18 | Ht 71.25 in | Wt 211.0 lb

## 2022-06-16 DIAGNOSIS — E785 Hyperlipidemia, unspecified: Secondary | ICD-10-CM | POA: Insufficient documentation

## 2022-06-16 DIAGNOSIS — D696 Thrombocytopenia, unspecified: Secondary | ICD-10-CM | POA: Diagnosis not present

## 2022-06-16 DIAGNOSIS — N400 Enlarged prostate without lower urinary tract symptoms: Secondary | ICD-10-CM | POA: Insufficient documentation

## 2022-06-16 DIAGNOSIS — Z794 Long term (current) use of insulin: Secondary | ICD-10-CM | POA: Insufficient documentation

## 2022-06-16 DIAGNOSIS — K449 Diaphragmatic hernia without obstruction or gangrene: Secondary | ICD-10-CM | POA: Diagnosis not present

## 2022-06-16 DIAGNOSIS — Z85828 Personal history of other malignant neoplasm of skin: Secondary | ICD-10-CM | POA: Diagnosis not present

## 2022-06-16 DIAGNOSIS — Z79899 Other long term (current) drug therapy: Secondary | ICD-10-CM | POA: Insufficient documentation

## 2022-06-16 DIAGNOSIS — E7800 Pure hypercholesterolemia, unspecified: Secondary | ICD-10-CM | POA: Diagnosis not present

## 2022-06-16 DIAGNOSIS — R718 Other abnormality of red blood cells: Secondary | ICD-10-CM | POA: Insufficient documentation

## 2022-06-16 DIAGNOSIS — I10 Essential (primary) hypertension: Secondary | ICD-10-CM | POA: Diagnosis not present

## 2022-06-16 DIAGNOSIS — N189 Chronic kidney disease, unspecified: Secondary | ICD-10-CM | POA: Diagnosis not present

## 2022-06-16 DIAGNOSIS — Z7984 Long term (current) use of oral hypoglycemic drugs: Secondary | ICD-10-CM | POA: Diagnosis not present

## 2022-06-16 DIAGNOSIS — I700 Atherosclerosis of aorta: Secondary | ICD-10-CM | POA: Diagnosis not present

## 2022-06-16 DIAGNOSIS — Z809 Family history of malignant neoplasm, unspecified: Secondary | ICD-10-CM | POA: Diagnosis not present

## 2022-06-16 DIAGNOSIS — E1140 Type 2 diabetes mellitus with diabetic neuropathy, unspecified: Secondary | ICD-10-CM | POA: Diagnosis not present

## 2022-06-16 DIAGNOSIS — K219 Gastro-esophageal reflux disease without esophagitis: Secondary | ICD-10-CM | POA: Diagnosis not present

## 2022-06-16 DIAGNOSIS — G40909 Epilepsy, unspecified, not intractable, without status epilepticus: Secondary | ICD-10-CM | POA: Diagnosis not present

## 2022-06-16 DIAGNOSIS — Z7985 Long-term (current) use of injectable non-insulin antidiabetic drugs: Secondary | ICD-10-CM | POA: Diagnosis not present

## 2022-06-16 LAB — CBC
HCT: 42.8 % (ref 39.0–52.0)
Hemoglobin: 14.8 g/dL (ref 13.0–17.0)
MCH: 36.4 pg — ABNORMAL HIGH (ref 26.0–34.0)
MCHC: 34.6 g/dL (ref 30.0–36.0)
MCV: 105.2 fL — ABNORMAL HIGH (ref 80.0–100.0)
Platelets: 79 10*3/uL — ABNORMAL LOW (ref 150–400)
RBC: 4.07 MIL/uL — ABNORMAL LOW (ref 4.22–5.81)
RDW: 13.6 % (ref 11.5–15.5)
WBC: 3.8 10*3/uL — ABNORMAL LOW (ref 4.0–10.5)
nRBC: 0 % (ref 0.0–0.2)

## 2022-06-16 LAB — FOLATE: Folate: >40 ng/mL (ref >5.9)

## 2022-06-16 LAB — IRON AND TIBC
Iron: 137 ug/dL (ref 45–182)
Saturation Ratios: 54 % — ABNORMAL HIGH (ref 17.9–39.5)
TIBC: 252 ug/dL (ref 250–450)
UIBC: 115 ug/dL

## 2022-06-16 LAB — FERRITIN: Ferritin: 139 ng/mL (ref 24–336)

## 2022-06-16 NOTE — Addendum Note (Signed)
Addended by: Sofie Rower A on: 06/16/2022 03:47 PM   Modules accepted: Orders

## 2022-06-16 NOTE — Progress Notes (Signed)
[place]  [phone] [phone]) [phone]  ID: Jeremy [name] OB: 1936/02/05  MR#: [id number]  BJS#:[id number]  Patient Care Team: Steele Sizer, MD as PCP - General (Family Medicine) Requested, Self Birder Robson, MD as Consulting Physician (Ophthalmology) Earnestine Leys, MD as Consulting Physician (Orthopedic Surgery) Isaias Cowman, MD as Consulting Physician (Cardiology) Lonia Farber, MD as Consulting Physician (Internal Medicine) Sharlotte Alamo, DPM (Podiatry) Ralene Bathe, MD (Dermatology) Lloyd Huger, MD as Consulting Physician (Oncology)  CHIEF COMPLAINT: Thrombocytopenia.  INTERVAL HISTORY: Patient is an 87 year old male who was noted to have a mild thrombocytopenia on routine blood work.  He currently feels well and is asymptomatic.  He has no neurologic complaints.  He denies any recent fevers or illnesses.  He has a good appetite and denies weight loss.  He has no chest pain, shortness of breath, cough, or hemoptysis.  He denies any nausea, vomiting, constipation, or diarrhea.  He has no urinary complaints.  Patient feels at his baseline offers no specific complaints today.  REVIEW OF SYSTEMS:   Review of Systems  Constitutional: Negative.  Negative for fever, malaise/fatigue and weight loss.  Respiratory: Negative.  Negative for cough, hemoptysis and shortness of breath.   Cardiovascular: Negative.  Negative for chest pain and leg swelling.  Gastrointestinal: Negative.  Negative for abdominal pain.  Genitourinary: Negative.  Negative for dysuria and hematuria.  Musculoskeletal:  Negative for back pain.  Skin: Negative.  Negative for rash.  Neurological: Negative.  Negative for dizziness, focal weakness, weakness and headaches.  Endo/Heme/Allergies:  Does not bruise/bleed easily.  Psychiatric/Behavioral: Negative.  The patient is not nervous/anxious.     As per HPI. Otherwise, a complete review of systems  is negative.  PAST MEDICAL HISTORY: Past Medical History:  Diagnosis Date   Allergy    Basal cell carcinoma 10/09/2021   right medial forehead at glabella, need treatment   Chronic kidney disease    Diabetes mellitus without complication ([place])    Diabetic neuropathy ([place])    Diverticula, colon 1980's   Diverticulosis    Enlarged prostate    GERD (gastroesophageal reflux disease)    Glaucoma    Hemorrhoids    High cholesterol    History of hiatal hernia    Hyperlipidemia    Hypertension    Obesity    Onychomycosis    Seizures ([place])    Small bowel obstruction ([place]) 1990's   Urination disorder     PAST SURGICAL HISTORY: Past Surgical History:  Procedure Laterality Date   APPENDECTOMY     BLADDER DIVERTICULECTOMY  2013   CARPAL TUNNEL RELEASE  01/2022   revision right side   COLON SURGERY Left 1980's   COLONOSCOPY W/ POLYPECTOMY  1990's   Dr Sharlet Salina   COLONOSCOPY WITH PROPOFOL N/A 12/21/2014   Procedure: COLONOSCOPY WITH PROPOFOL;  Surgeon: Lollie Sails, MD;  Location: [facility] ENDOSCOPY;  Service: Endoscopy;  Laterality: N/A;   COLONOSCOPY WITH PROPOFOL N/A 08/16/2017   Procedure: COLONOSCOPY WITH PROPOFOL;  Surgeon: Lollie Sails, MD;  Location: [facility] ENDOSCOPY;  Service: Endoscopy;  Laterality: N/A;   COLONOSCOPY WITH PROPOFOL N/A 01/31/2021   Procedure: COLONOSCOPY WITH PROPOFOL;  Surgeon: Robert Bellow, MD;  Location: [facility] ENDOSCOPY;  Service: Endoscopy;  Laterality: N/A;   ESOPHAGOGASTRODUODENOSCOPY (EGD) WITH PROPOFOL N/A 02/15/2015   Procedure: ESOPHAGOGASTRODUODENOSCOPY (EGD) WITH PROPOFOL;  Surgeon: Lollie Sails, MD;  Location: [facility] ENDOSCOPY;  Service: Endoscopy;  Laterality: N/A;   ESOPHAGOGASTRODUODENOSCOPY (EGD) WITH PROPOFOL N/A 06/10/2017  Procedure: ESOPHAGOGASTRODUODENOSCOPY (EGD) WITH PROPOFOL;  Surgeon: Lollie Sails, MD;  Location: [facility] ENDOSCOPY;  Service: Endoscopy;  Laterality: N/A;   ESOPHAGOGASTRODUODENOSCOPY (EGD) WITH  PROPOFOL N/A 08/16/2017   Procedure: ESOPHAGOGASTRODUODENOSCOPY (EGD) WITH PROPOFOL;  Surgeon: Lollie Sails, MD;  Location: [facility] ENDOSCOPY;  Service: Endoscopy;  Laterality: N/A;   ESOPHAGOGASTRODUODENOSCOPY (EGD) WITH PROPOFOL N/A 01/31/2021   Procedure: ESOPHAGOGASTRODUODENOSCOPY (EGD) WITH PROPOFOL;  Surgeon: Robert Bellow, MD;  Location: [facility] ENDOSCOPY;  Service: Endoscopy;  Laterality: N/A;   EYE SURGERY     GLAUCOMA SURGERY Left 1990's   [facility]   HEMORROIDECTOMY  1980's   [facility]   HERNIA REPAIR     [facility], Dr Alfredia Client HERNIA REPAIR Bilateral [phone]   LARYNX SURGERY  1990   3 times   MOHS SURGERY  01/22/2022   Dr. Purcell Nails - [facility]   RETINAL DETACHMENT SURGERY Right    TONSILLECTOMY     turp  2013   URETER SURGERY  2013   Dr Jacqlyn Larsen    FAMILY HISTORY: Family History  Problem Relation Age of Onset   Diabetes Mother    Cataracts Mother    Metabolic syndrome Mother    Blindness Mother        r/t diabetes   Cataracts Father    Glaucoma Father        Retina-Detachment   Atrial fibrillation Father    Diverticulitis Sister    Glaucoma Brother    Diabetes Brother    Cancer Sister        Gallbladder   Cancer Daughter     ADVANCED DIRECTIVES (Y/N):  N  HEALTH MAINTENANCE: Social History   Tobacco Use   Smoking status: Never   Smokeless tobacco: Never   Tobacco comments:    smoking cessation materials not required  Vaping Use   Vaping Use: Never used  Substance Use Topics   Alcohol use: No   Drug use: No     Colonoscopy:  PAP:  Bone density:  Lipid panel:  Allergies  Allergen Reactions   Demerol [Meperidine] Other (See Comments) and Anaphylaxis    MAKES BLOOD PRESSURE BOTTOM OUT hypotension   Dilaudid [Hydromorphone Hcl]    Hydromorphone     Other reaction(s): Unknown MAKES BLOOD PRESSURE BOTTOM OUT   Meperidine Hcl    Actos [Pioglitazone] Other (See Comments) and Hives   Dapagliflozin Rash   Penicillins Rash     Current Outpatient Medications  Medication Sig Dispense Refill   bisoprolol (ZEBETA) 10 MG tablet Take 1 tablet (10 mg total) by mouth daily. 90 tablet 1   brimonidine (ALPHAGAN P) 0.1 % SOLN Place 1 drop into the left eye 2 (two) times daily.      desonide (DESOWEN) 0.05 % ointment Apply to rash under eye QD-BID PRN 15 g 1   dorzolamide (TRUSOPT) 2 % ophthalmic solution Place 1 drop into the left eye 2 (two) times daily.  3   Exenatide ER (BYDUREON BCISE) 2 MG/0.85ML AUIJ Inject 2 mg into the skin once a week.     finasteride (PROSCAR) 5 MG tablet Take 1 tablet (5 mg total) by mouth daily. 90 tablet 3   glipiZIDE (GLUCOTROL XL) 2.5 MG 24 hr tablet Take 2.5 mg by mouth daily with breakfast.     hydrocortisone 2.5 % cream APPLY TO THE AFFECTED AREAS OF FACE AND EARS EVERY OTHER NIGHT. ALTERNATING WITH KETOCONAZOLE CREAM 28 g 0   insulin degludec (TRESIBA FLEXTOUCH) 200 UNIT/ML FlexTouch Pen Inject  45 Units into the skin in the morning.     ketoconazole (NIZORAL) 2 % cream Apply to aa's face and ears every other night alternating with Hydrocortisone cream. 60 g 11   ketoconazole (NIZORAL) 2 % shampoo Apply 1 application topically 3 (three) times a week. Shampoo scalp 3 days a week let sit 5 minutes and rinse off 120 mL 11   metFORMIN (GLUCOPHAGE) 1000 MG tablet Take 1,000 mg by mouth 2 (two) times daily with a meal.     mometasone (ELOCON) 0.1 % cream Apply topically daily. 45 g 0   mometasone (ELOCON) 0.1 % lotion APPLY TO THE AFFECTED AREAS OF SCALP THREE NIGHTS PER WEEK 60 mL 0   omega-3 acid ethyl esters (LOVAZA) 1 g capsule Take 2 capsules (2 g total) by mouth 2 (two) times daily. 480 capsule 3   omeprazole (PRILOSEC) 20 MG capsule Take 1 capsule (20 mg total) by mouth daily. 90 capsule 1   rosuvastatin (CRESTOR) 10 MG tablet Take 1 tablet (10 mg total) by mouth at bedtime. 90 tablet 1   tadalafil (CIALIS) 20 MG tablet Take 0.5-1 tablets (10-20 mg total) by mouth every other day as needed  for erectile dysfunction. 30 tablet 0   telmisartan-hydrochlorothiazide (MICARDIS HCT) 80-12.5 MG tablet Take 1 tablet by mouth daily. 90 tablet 0   timolol (TIMOPTIC) 0.5 % ophthalmic solution INSTILL 1 DROP INTO LEFT EYE TWICE DAILY     No current facility-administered medications for this visit.    OBJECTIVE: Vitals:   06/16/22 1333 06/16/22 1335  BP:  (!) 141/75  Pulse: 76 76  Resp:  18  Temp: (!) 97 F (36.1 C) (!) 97 F (36.1 C)  SpO2:  99%     Body mass index is 29.22 kg/m.    ECOG FS:0 - Asymptomatic  General: Well-developed, well-nourished, no acute distress. Eyes: Pink conjunctiva, anicteric sclera. HEENT: Normocephalic, moist mucous membranes. Lungs: No audible wheezing or coughing. Heart: Regular rate and rhythm. Abdomen: Soft, nontender, no obvious distention. Musculoskeletal: No edema, cyanosis, or clubbing. Neuro: Alert, answering all questions appropriately. Cranial nerves grossly intact. Skin: No rashes or petechiae noted. Psych: Normal affect. Lymphatics: No cervical, calvicular, axillary or inguinal LAD.   LAB RESULTS:  Lab Results  Component Value Date   NA 142 06/03/2022   K 4.9 06/03/2022   CL 105 06/03/2022   CO2 31 06/03/2022   GLUCOSE 149 (H) 06/03/2022   BUN 19 06/03/2022   CREATININE 0.68 (L) 06/03/2022   CALCIUM 9.1 06/03/2022   PROT 6.4 06/03/2022   ALBUMIN 4.4 09/29/2016   AST 24 06/03/2022   ALT 25 06/03/2022   ALKPHOS 30 (L) 09/29/2016   BILITOT 1.0 06/03/2022   GFRNONAA 86 02/28/2020   GFRAA 99 02/28/2020    Lab Results  Component Value Date   WBC 4.3 06/03/2022   NEUTROABS 1,664 06/03/2022   HGB 14.5 06/03/2022   HCT 40.8 06/03/2022   MCV 102.8 (H) 06/03/2022   PLT 138 (L) 06/03/2022     STUDIES: MR ABDOMEN WWO CONTRAST  Result Date: 06/16/2022 CLINICAL DATA:  Left hepatic lobe lesion on ultrasound. EXAM: MRI ABDOMEN WITHOUT AND WITH CONTRAST TECHNIQUE: Multiplanar multisequence MR imaging of the abdomen was  performed both before and after the administration of intravenous contrast. CONTRAST:  73m GADAVIST GADOBUTROL 1 MMOL/ML IV SOLN COMPARISON:  Ultrasound of 06/04/2022.  CT of 07/21/2018. FINDINGS: Lower chest: Mild cardiomegaly, without pericardial or pleural effusion. Hepatobiliary: No evidence of cirrhosis. There are multiple areas  of capsular based left hepatic lobe signal abnormality which are most consistent with focal steatosis. Most apparent on out of phase series 5, including at up to 2.1 cm on 31/5. This larger area corresponds to the ultrasound abnormality. No suspicious liver lesion or biliary abnormality. Pancreas: Normal pancreas for age, with mild atrophy but no duct dilatation or acute inflammation. Spleen:  Normal in size, without focal abnormality. Adrenals/Urinary Tract: Normal adrenal glands. Right renal cysts of up to 4.7 cm . In the absence of clinically indicated signs/symptoms require(s) no independent follow-up. Normal left kidney. Minimal right-sided caliectasis, most apparent on 12/3. No hydroureter. Stomach/Bowel: Proximal gastric underdistention. Normal abdominal small bowel. Large colonic stool burden, especially within the sigmoid. Vascular/Lymphatic: Aortic atherosclerosis. No retroperitoneal or retrocrural adenopathy. Other:  No ascites. Musculoskeletal: Moderate convex left lumbar spine curvature. IMPRESSION: 1. No evidence of cirrhosis. 2. The left hepatic lobe ultrasound abnormality corresponds to 1 of multiple areas of capsular based focal steatosis. 3. Minimal right-sided caliectasis without hydroureter, of indeterminate clinical significance. Possibly minimal chronic UPJ obstruction. 4.  Possible constipation. 5.  Aortic Atherosclerosis (ICD10-I70.0). Electronically Signed   By: Abigail Miyamoto M.D.   On: 06/16/2022 10:44   US ABDOMEN RUQ W/ELASTOGRAPHY  Result Date: 06/08/2022 CLINICAL DATA:  Portal venous hypertension EXAM: US ABDOMEN LIMITED - RIGHT UPPER QUADRANT  ULTRASOUND HEPATIC ELASTOGRAPHY TECHNIQUE: Sonography of the right upper quadrant was performed. In addition, ultrasound elastography evaluation of the liver was performed. A region of interest was placed within the right lobe of the liver. Following application of a compressive sonographic pulse, tissue compressibility was assessed. Multiple assessments were performed at the selected site. Median tissue compressibility was determined. Previously, hepatic stiffness was assessed by shear wave velocity. Based on recently published Society of Radiologists in Ultrasound consensus article, reporting is now recommended to be performed in the SI units of pressure (kiloPascals) representing hepatic stiffness/elasticity. The obtained result is compared to the published reference standards. (cACLD = compensated Advanced Chronic Liver Disease) COMPARISON:  02/08/2019 FINDINGS: ULTRASOUND ABDOMEN LIMITED RIGHT UPPER QUADRANT Gallbladder: No gallstones or wall thickening visualized. No sonographic Murphy sign noted. Common bile duct: Diameter: 0.2 cm Liver: Subcapsular echogenic mass of the anterior left lobe of the liver measuring 2.1 x 1.6 x 1.4 cm Coarse contour of the liver. Increased parenchymal echogenicity. Portal vein is patent on color Doppler imaging with normal direction of blood flow towards the liver. ULTRASOUND HEPATIC ELASTOGRAPHY Device: Siemens Helix VTQ Patient position: Supine Transducer 5C1 Number of measurements: 10 Hepatic segment:  8 Median kPa: 1.9 IQR: 0.2 IQR/Median kPa ratio: 0.11 Data quality:  Good Diagnostic category:  < or = 5 kPa: high probability of being normal The use of hepatic elastography is applicable to patients with viral hepatitis and non-alcoholic fatty liver disease. At this time, there is insufficient data for the referenced cut-off values and use in other causes of liver disease, including alcoholic liver disease. Patients, however, may be assessed by elastography and serve as their  own reference standard/baseline. In patients with non-alcoholic liver disease, the values suggesting compensated advanced chronic liver disease (cACLD) may be lower, and patients may need additional testing with elasticity results of 7-9 kPa. Please note that abnormal hepatic elasticity and shear wave velocities may also be identified in clinical settings other than with hepatic fibrosis, such as: acute hepatitis, elevated right heart and central venous pressures including use of beta blockers, veno-occlusive disease (Budd-Chiari), infiltrative processes such as mastocytosis/amyloidosis/infiltrative tumor/lymphoma, extrahepatic cholestasis, with hyperemia in the post-prandial state,  and with liver transplantation. Correlation with patient history, laboratory data, and clinical condition recommended. Diagnostic Categories: < or =5 kPa: high probability of being normal < or =9 kPa: in the absence of other known clinical signs, rules out cACLD >9 kPa and ?13 kPa: suggestive of cACLD, but needs further testing >13 kPa: highly suggestive of cACLD > or =17 kPa: highly suggestive of cACLD with an increased probability of clinically significant portal hypertension IMPRESSION: ULTRASOUND RUQ: 1. Subcapsular echogenic mass of the anterior left lobe of the liver measuring 2.1 cm. Although ultrasound appearance is suggestive of a benign hemangioma, this lesion was not present on prior examination and the presence of any new mass in the setting of high risk chronic liver disease is suspicious for malignancy. Recommend multiphasic contrast enhanced MRI or CT to further evaluate. 2.  Coarse, nodular contour of the liver suggestive of cirrhosis. 3.  Hepatic steatosis. ULTRASOUND HEPATIC ELASTOGRAPHY: Median kPa:  1.9 Diagnostic category:  < or = 5 kPa: high probability of being normal These results will be called to the ordering clinician or representative by the Radiologist Assistant, and communication documented in the PACS or  [employer]. Electronically Signed   By: Delanna Ahmadi M.D.   On: 06/08/2022 21:09    ASSESSMENT: Thrombocytopenia.  PLAN:    Thrombocytopenia: Upon review of the records patient has had a mild, intermittent thrombocytopenia since September 2013 ranging from 124-170.  Today's result is much lower at 79.  The remainder of his laboratory work is pending at time of dictation.  No intervention is needed at this time.  Return to clinic in 1 month for repeat laboratory work only and then in 6 months with repeat laboratory work and further evaluation. Elevated MCV: Mild.  Folate and B12 levels are within normal limits.  I spent a total of 45 minutes reviewing chart data, face-to-face evaluation with the patient, counseling and coordination of care as detailed above.   Patient expressed understanding and was in agreement with this plan. He also understands that He can call clinic at any time with any questions, concerns, or complaints.    Lloyd Huger, MD   06/16/2022 2:08 PM

## 2022-06-16 NOTE — Progress Notes (Signed)
Pt states he is here for elevated MCV. States he has some kind of lesion in his liver. Pt knows he has had low platelets in the past. Pt continues to work. Pt is diabetic, taking jardiance he has lost 44lbs.

## 2022-06-18 DIAGNOSIS — E1142 Type 2 diabetes mellitus with diabetic polyneuropathy: Secondary | ICD-10-CM | POA: Diagnosis not present

## 2022-06-18 DIAGNOSIS — B351 Tinea unguium: Secondary | ICD-10-CM | POA: Diagnosis not present

## 2022-06-18 LAB — PROTEIN ELECTROPHORESIS, SERUM
A/G Ratio: 1.5 (ref 0.7–1.7)
Albumin ELP: 3.8 g/dL (ref 2.9–4.4)
Alpha-1-Globulin: 0.2 g/dL (ref 0.0–0.4)
Alpha-2-Globulin: 0.6 g/dL (ref 0.4–1.0)
Beta Globulin: 0.7 g/dL (ref 0.7–1.3)
Gamma Globulin: 1.1 g/dL (ref 0.4–1.8)
Globulin, Total: 2.6 g/dL (ref 2.2–3.9)
M-Spike, %: 0.2 g/dL — ABNORMAL HIGH
Total Protein ELP: 6.4 g/dL (ref 6.0–8.5)

## 2022-06-19 LAB — PLATELET ANTIBODY PROFILE
Glycoprotein IV Antibody: NEGATIVE
HLA Ab Ser Ql EIA: NEGATIVE
IA/IIA Antibody: NEGATIVE
IB/IX Antibody: NEGATIVE
IIB/IIIA Antibody: NEGATIVE

## 2022-06-30 ENCOUNTER — Other Ambulatory Visit: Payer: Self-pay | Admitting: Family Medicine

## 2022-06-30 ENCOUNTER — Telehealth: Payer: Self-pay | Admitting: Internal Medicine

## 2022-06-30 DIAGNOSIS — R93429 Abnormal radiologic findings on diagnostic imaging of unspecified kidney: Secondary | ICD-10-CM

## 2022-06-30 NOTE — Telephone Encounter (Signed)
GI Phone note  I called Dr. Oswaldo Milian to notify him of the addended copy of his MRI showing renal  abnormality, possibly benign postop finding. Patient requested I notify his PCP, Dr. Ancil Boozer, which I did via the secure chat function of [employer]. Patient voiced his intention to obtain Urology consultation with Dr. Bernardo Heater to get his opinion on the abnormality. Patient had no further questions.   Robet Leu, M.D. ABIM Diplomate in Gastroenterology [place]

## 2022-07-07 ENCOUNTER — Telehealth: Payer: Self-pay | Admitting: Family Medicine

## 2022-07-07 ENCOUNTER — Telehealth: Payer: Self-pay | Admitting: Urology

## 2022-07-07 NOTE — Telephone Encounter (Signed)
we just need to make sure we have his notes and infor. He is new patient

## 2022-07-07 NOTE — Telephone Encounter (Unsigned)
Copied from [place] [phone]. Topic: General - Other >> Jul 07, 2022 11:43 AM Sabas Sous wrote: Reason for CRM: Pt is requesting to speak to PCP/Nurse regarding recent imaging with his GI specialist. Has plans that he wants to propose  Best contact: [phone]

## 2022-07-07 NOTE — Telephone Encounter (Signed)
Called and lvm informing patient to reach out to Dr. Bernardo Heater.

## 2022-07-07 NOTE — Telephone Encounter (Signed)
Pt, Dr Oswaldo Milian, called to make a NP appt from referral and would like for Dr Bernardo Heater to look at his records and see if he would like to order any more imaging prior to his appt.

## 2022-07-11 ENCOUNTER — Other Ambulatory Visit: Payer: Self-pay | Admitting: Family Medicine

## 2022-07-11 DIAGNOSIS — N400 Enlarged prostate without lower urinary tract symptoms: Secondary | ICD-10-CM

## 2022-07-17 ENCOUNTER — Inpatient Hospital Stay: Payer: Medicare Other | Attending: Oncology

## 2022-07-17 DIAGNOSIS — Z23 Encounter for immunization: Secondary | ICD-10-CM | POA: Diagnosis not present

## 2022-07-23 ENCOUNTER — Ambulatory Visit: Payer: Self-pay

## 2022-07-23 NOTE — Telephone Encounter (Signed)
Called patient to check on him told him if symptoms gets worse needs to go to ER.  He has an appt tomorroW am

## 2022-07-23 NOTE — Telephone Encounter (Signed)
  Chief Complaint: Mid abdomen pain Symptoms: Pain 10/10 since 'Sunday Frequency: Sunday Pertinent Negatives: Patient denies Fever,  Disposition: []ED /[]Urgent Care (no appt availability in office) / []Appointment(In office/virtual)/ [] [facility]/ []Home Care/ [x]Refused Recommended Disposition /[][facility]/ [] Follow-up with PCP Additional Notes: PT started Ozempic on Saturday and has had abdominal pain since Sunday. PT believes that Ozempic is responsible for this pain. Pt does not think that he needs to go to ED, but will if s/s progress.  Pt states that he is unable to eat from the pain. Pt is requesting a call back from Dr. Sowles today for further advice.  Please advise.    Reason for Disposition  [1] SEVERE pain AND [2] age > 60 years  Answer Assessment - Initial Assessment Questions 1. LOCATION: "Where does it hurt?"      Mid abdomen 2. RADIATION: "Does the pain shoot anywhere else?" (e.g., chest, back)      3. ONSET: "When did the pain begin?" (Minutes, hours or days ago)      Sunday 4. SUDDEN: "Gradual or sudden onset?"     gradual 5. PATTERN "Does the pain come and go, or is it constant?"    - If it comes and goes: "How long does it last?" "Do you have pain now?"     (Note: Comes and goes means the pain is intermittent. It goes away completely between bouts.)    - If constant: "Is it getting better, staying the same, or getting worse?"      (Note: Constant means the pain never goes away completely; most serious pain is constant and gets worse.)      Constant 6. SEVERITY: "How bad is the pain?"  (e.g., Scale 1-10; mild, moderate, or severe)    - MILD (1-3): Doesn\'t interfere with normal activities, abdomen soft and not tender to touch.     - MODERATE (4-7): Interferes with normal activities or awakens from sleep, abdomen tender to touch.     - SEVERE (8-10): Excruciating pain, doubled over, unable to do any normal activities.       10/10 7.  RECURRENT SYMPTOM: "Have you ever had this type of stomach pain before?" If Yes, ask: "When was the last time?" and "What happened that time?"      no 8. CAUSE: "What do you think is causing the stomach pain?"     Ozempic 9. RELIEVING/AGGRAVATING FACTORS: "What makes it better or worse?" (e.g., antacids, bending or twisting motion, bowel movement)      10'$ . OTHER SYMPTOMS: "Do you have any other symptoms?" (e.g., back pain, diarrhea, fever, urination pain, vomiting)       Diarrhea - unable to eat  Protocols used: Abdominal Pain - Male-A-AH

## 2022-07-24 ENCOUNTER — Encounter: Payer: Self-pay | Admitting: Family Medicine

## 2022-07-24 ENCOUNTER — Ambulatory Visit (INDEPENDENT_AMBULATORY_CARE_PROVIDER_SITE_OTHER): Payer: Medicare Other | Admitting: Family Medicine

## 2022-07-24 ENCOUNTER — Ambulatory Visit
Admission: RE | Admit: 2022-07-24 | Discharge: 2022-07-24 | Disposition: A | Discharge status: Home / Self Care | Payer: Medicare Other | Source: Ambulatory Visit | Attending: Family Medicine | Admitting: Family Medicine

## 2022-07-24 ENCOUNTER — Other Ambulatory Visit
Admission: RE | Admit: 2022-07-24 | Discharge: 2022-07-24 | Disposition: A | Discharge status: Home / Self Care | Payer: Medicare Other | Source: Ambulatory Visit | Attending: Family Medicine | Admitting: Family Medicine

## 2022-07-24 VITALS — BP 130/82 | HR 84 | Temp 97.7°F | Resp 16 | Ht 71.25 in | Wt 205.7 lb

## 2022-07-24 DIAGNOSIS — R112 Nausea with vomiting, unspecified: Secondary | ICD-10-CM

## 2022-07-24 DIAGNOSIS — Z8719 Personal history of other diseases of the digestive system: Secondary | ICD-10-CM

## 2022-07-24 DIAGNOSIS — R109 Unspecified abdominal pain: Secondary | ICD-10-CM

## 2022-07-24 DIAGNOSIS — Z9049 Acquired absence of other specified parts of digestive tract: Secondary | ICD-10-CM

## 2022-07-24 DIAGNOSIS — I700 Atherosclerosis of aorta: Secondary | ICD-10-CM | POA: Diagnosis not present

## 2022-07-24 LAB — CBC WITH DIFFERENTIAL/PLATELET
Abs Immature Granulocytes: 0.08 10*3/uL — ABNORMAL HIGH (ref 0.00–0.07)
Basophils Absolute: 0 10*3/uL (ref 0.0–0.1)
Basophils Relative: 0 %
Eosinophils Absolute: 0.2 10*3/uL (ref 0.0–0.5)
Eosinophils Relative: 3 %
HCT: 47.3 % (ref 39.0–52.0)
Hemoglobin: 15.8 g/dL (ref 13.0–17.0)
Immature Granulocytes: 1 %
Lymphocytes Relative: 18 %
Lymphs Abs: 1.4 10*3/uL (ref 0.7–4.0)
MCH: 35.7 pg — ABNORMAL HIGH (ref 26.0–34.0)
MCHC: 33.4 g/dL (ref 30.0–36.0)
MCV: 107 fL — ABNORMAL HIGH (ref 80.0–100.0)
Monocytes Absolute: 1.3 10*3/uL — ABNORMAL HIGH (ref 0.1–1.0)
Monocytes Relative: 16 %
Neutro Abs: 4.8 10*3/uL (ref 1.7–7.7)
Neutrophils Relative %: 62 %
Platelets: 105 10*3/uL — ABNORMAL LOW (ref 150–400)
RBC: 4.42 MIL/uL (ref 4.22–5.81)
RDW: 13.6 % (ref 11.5–15.5)
WBC: 7.8 10*3/uL (ref 4.0–10.5)
nRBC: 0 % (ref 0.0–0.2)

## 2022-07-24 LAB — COMPREHENSIVE METABOLIC PANEL
ALT: 42 U/L (ref 0–44)
AST: 45 U/L — ABNORMAL HIGH (ref 15–41)
Albumin: 4 g/dL (ref 3.5–5.0)
Alkaline Phosphatase: 62 U/L (ref 38–126)
Anion gap: 12 (ref 5–15)
BUN: 21 mg/dL (ref 8–23)
CO2: 23 mmol/L (ref 22–32)
Calcium: 8.5 mg/dL — ABNORMAL LOW (ref 8.9–10.3)
Chloride: 99 mmol/L (ref 98–111)
Creatinine, Ser: 0.84 mg/dL (ref 0.61–1.24)
GFR, Estimated: >60 mL/min (ref >60)
Glucose, Bld: 95 mg/dL (ref 70–99)
Potassium: 3.8 mmol/L (ref 3.5–5.1)
Sodium: 134 mmol/L — ABNORMAL LOW (ref 135–145)
Total Bilirubin: 1.7 mg/dL — ABNORMAL HIGH (ref 0.3–1.2)
Total Protein: 7 g/dL (ref 6.5–8.1)

## 2022-07-24 LAB — LIPASE, BLOOD: Lipase: 33 U/L (ref 11–51)

## 2022-07-24 NOTE — Progress Notes (Signed)
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 He has poor appetite and last night he woke up at 2 am and vomited all the food he ate this week. He has a history of left hemicolectomy and about 10 years later had to be admitted for small bowel obstruction. He states the symptoms are the same of when he had small bowel obstruction. Only one small bowel movement 3 days ago. No blood in stools. No fever or chills. Appetite is very poor  He drank a small amount of water with one Tylenol and Naproxen at 6 am   Patient Active Problem List   Diagnosis Date Noted   Thrombocytopenia ([place]) 06/03/2022   B12 deficiency 06/03/2022   Basal cell carcinoma (BCC) of skin of face 11/03/2021   Dyslipidemia associated with type 2 diabetes mellitus ([place]) 11/03/2021   Impingement syndrome of shoulder region 11/16/2018   Atherosclerosis of aorta ([place]) 07/21/2018   History of diverticulitis of colon 11/15/2017   Hyperlipidemia due to type 2 diabetes mellitus ([place]) 03/14/2017   Bilateral lower extremity edema 03/27/2016   Vitamin D deficiency 12/05/2015   Hx of transient ischemic attack (TIA) 09/17/2015   Diabetes mellitus type 2 without retinopathy ([place]) 05/31/2015   Hyperlipidemia 05/30/2015   Type 2 diabetes mellitus with renal manifestations ([place]) 01/08/2015   Incisional hernia, without obstruction or gangrene 03/23/2013   Enlarged prostate with lower urinary tract symptoms (LUTS) 11/03/2012   Myopic degeneration, bilateral 08/12/2012   Nonexudative age-related macular degeneration 07/15/2012   Anisometropia 04/08/2012   Pseudoaphakia 04/08/2012   History of retinal detachment 04/08/2012   Presence of intraocular lens 04/08/2012   Secondary open-angle glaucoma of left eye,  severe stage 01/13/2012   Benign essential HTN 02/03/2007   Hypertension associated with diabetes ([place]) 02/03/2007    Past Surgical History:  Procedure Laterality Date   APPENDECTOMY     BLADDER DIVERTICULECTOMY  2013   CARPAL TUNNEL RELEASE  01/2022   revision right side   COLON SURGERY Left 1980's   COLONOSCOPY W/ POLYPECTOMY  1990's   Dr Sharlet Salina   COLONOSCOPY WITH PROPOFOL N/A 12/21/2014   Procedure: COLONOSCOPY WITH PROPOFOL;  Surgeon: Lollie Sails, MD;  Location: [facility] ENDOSCOPY;  Service: Endoscopy;  Laterality: N/A;   COLONOSCOPY WITH PROPOFOL N/A 08/16/2017   Procedure: COLONOSCOPY WITH PROPOFOL;  Surgeon: Lollie Sails, MD;  Location: [facility] ENDOSCOPY;  Service: Endoscopy;  Laterality: N/A;   COLONOSCOPY WITH PROPOFOL N/A 01/31/2021   Procedure: COLONOSCOPY WITH PROPOFOL;  Surgeon: Robert Bellow, MD;  Location: [facility] ENDOSCOPY;  Service: Endoscopy;  Laterality: N/A;   ESOPHAGOGASTRODUODENOSCOPY (EGD) WITH PROPOFOL N/A 02/15/2015   Procedure: ESOPHAGOGASTRODUODENOSCOPY (EGD) WITH PROPOFOL;  Surgeon: Lollie Sails, MD;  Location: [facility] ENDOSCOPY;  Service: Endoscopy;  Laterality: N/A;   ESOPHAGOGASTRODUODENOSCOPY (EGD) WITH PROPOFOL N/A 06/10/2017   Procedure: ESOPHAGOGASTRODUODENOSCOPY (EGD) WITH PROPOFOL;  Surgeon: Lollie Sails, MD;  Location: [facility] ENDOSCOPY;  Service: Endoscopy;  Laterality: N/A;   ESOPHAGOGASTRODUODENOSCOPY (EGD) WITH PROPOFOL N/A 08/16/2017   Procedure: ESOPHAGOGASTRODUODENOSCOPY (EGD) WITH PROPOFOL;  Surgeon: Lollie Sails, MD;  Location: [facility] ENDOSCOPY;  Service: Endoscopy;  Laterality: N/A;   ESOPHAGOGASTRODUODENOSCOPY (EGD) WITH PROPOFOL N/A 01/31/2021   Procedure: ESOPHAGOGASTRODUODENOSCOPY (EGD) WITH PROPOFOL;  Surgeon: Robert Bellow, MD;  Location: [facility] ENDOSCOPY;  Service: Endoscopy;  Laterality: N/A;   EYE SURGERY     GLAUCOMA SURGERY Left 5's   [facility]   HEMORROIDECTOMY  [phone]   [facility]   HERNIA REPAIR      [facility], Dr Alfredia Client HERNIA REPAIR Bilateral [phone]   [place]   3 times   MOHS SURGERY  01/22/2022   Dr. Purcell Nails - [facility]   RETINAL DETACHMENT SURGERY Right    TONSILLECTOMY     turp  2013   URETER SURGERY  2013   Dr Jacqlyn Larsen    Family History  Problem Relation Age of Onset   Diabetes Mother    Cataracts Mother    Metabolic syndrome Mother    Blindness Mother        r/t diabetes   Cataracts Father    Glaucoma Father        Retina-Detachment   Atrial fibrillation Father    Diverticulitis Sister    Glaucoma Brother    Diabetes Brother    Cancer Sister        Gallbladder   Cancer Daughter     Social History   Tobacco Use   Smoking status: Never   Smokeless tobacco: Never   Tobacco comments:    smoking cessation materials not required  Substance Use Topics   Alcohol use: No     Current Outpatient Medications:    bisoprolol (ZEBETA) 10 MG tablet, Take 1 tablet (10 mg total) by mouth daily., Disp: 90 tablet, Rfl: 1   brimonidine (ALPHAGAN P) 0.1 % SOLN, Place 1 drop into the left eye 2 (two) times daily. , Disp: , Rfl:    desonide (DESOWEN) 0.05 % ointment, Apply to rash under eye QD-BID PRN, Disp: 15 g, Rfl: 1   dorzolamide (TRUSOPT) 2 % ophthalmic solution, Place 1 drop into the left eye 2 (two) times daily., Disp: , Rfl: 3   Exenatide ER (BYDUREON BCISE) 2 MG/0.85ML AUIJ, Inject 2 mg into the skin once a week., Disp: , Rfl:    finasteride (PROSCAR) 5 MG tablet, Take 1 tablet by mouth once daily, Disp: 90 tablet, Rfl: 0   glipiZIDE (GLUCOTROL XL) 2.5 MG 24 hr tablet, Take 2.5 mg by mouth daily with breakfast., Disp: , Rfl:    hydrocortisone 2.5 % cream, APPLY TO THE AFFECTED AREAS OF FACE AND EARS EVERY OTHER NIGHT. ALTERNATING WITH KETOCONAZOLE CREAM, Disp: 28 g, Rfl: 0   insulin degludec (TRESIBA FLEXTOUCH) 200 UNIT/ML FlexTouch Pen, Inject 45 Units into the skin in the morning., Disp: , Rfl:    ketoconazole (NIZORAL) 2 % cream, Apply  to aa's face and ears every other night alternating with Hydrocortisone cream., Disp: 60 g, Rfl: 11   ketoconazole (NIZORAL) 2 % shampoo, Apply 1 application topically 3 (three) times a week. Shampoo scalp 3 days a week let sit 5 minutes and rinse off, Disp: 120 mL, Rfl: 11   metFORMIN (GLUCOPHAGE) 1000 MG tablet, Take 1,000 mg by mouth 2 (two) times daily with a meal., Disp: , Rfl:    mometasone (ELOCON) 0.1 % cream, Apply topically daily., Disp: 45 g, Rfl: 0   mometasone (ELOCON) 0.1 % lotion, APPLY TO THE AFFECTED AREAS OF SCALP THREE NIGHTS PER WEEK, Disp: 60 mL, Rfl: 0   omega-3 acid ethyl esters (LOVAZA) 1 g capsule, Take 2 capsules (2 g total) by mouth 2 (two) times daily., Disp: 480 capsule, Rfl: 3  omeprazole (PRILOSEC) 20 MG capsule, Take 1 capsule (20 mg total) by mouth daily., Disp: 90 capsule, Rfl: 1   rosuvastatin (CRESTOR) 10 MG tablet, Take 1 tablet (10 mg total) by mouth at bedtime., Disp: 90 tablet, Rfl: 1   tadalafil (CIALIS) 20 MG tablet, Take 0.5-1 tablets (10-20 mg total) by mouth every other day as needed for erectile dysfunction., Disp: 30 tablet, Rfl: 0   telmisartan-hydrochlorothiazide (MICARDIS HCT) 80-12.5 MG tablet, Take 1 tablet by mouth daily., Disp: 90 tablet, Rfl: 0   timolol (TIMOPTIC) 0.5 % ophthalmic solution, INSTILL 1 DROP INTO LEFT EYE TWICE DAILY, Disp: , Rfl:   Allergies  Allergen Reactions   Demerol [Meperidine] Other (See Comments) and Anaphylaxis    MAKES BLOOD PRESSURE BOTTOM OUT hypotension   Dilaudid [Hydromorphone Hcl]    Hydromorphone     Other reaction(s): Unknown MAKES BLOOD PRESSURE BOTTOM OUT   Meperidine Hcl    Actos [Pioglitazone] Other (See Comments) and Hives   Dapagliflozin Rash   Penicillins Rash    I personally reviewed active problem list, medication list, allergies, family history, social history, health maintenance with the patient/caregiver today.   ROS  Ten systems reviewed and is negative except as mentioned in HPI    Objective  Vitals:   07/24/22 0811  BP: 130/82  Pulse: 84  Resp: 16  Temp: 97.7 F (36.5 C)  TempSrc: Oral  SpO2: 93%  Weight: 205 lb 11.2 oz (93.3 kg)  Height: 5' 11.25" (1.81 m)    Body mass index is 28.49 kg/m.  Physical Exam  Constitutional: Patient appears well-developed and well-nourished.  No distress.  HEENT: head atraumatic, normocephalic, pupils equal and reactive to light,, neck supple Cardiovascular: Normal rate, regular rhythm and normal heart sounds.  No murmur heard. No BLE edema. Pulmonary/Chest: Effort normal and breath sounds normal. No respiratory distress. Abdominal: Soft.  There is diffuse abdominal pain, no guarding or rebound tenderness, bowel sounds normal on all quadrants , except on LLQ where it was absent.  Psychiatric: Patient has a normal mood and affect. behavior is normal. Judgment and thought content normal.    PHQ2/9:    07/24/2022    8:11 AM 06/03/2022    1:16 PM 01/28/2022   11:03 AM 11/03/2021    1:49 PM 05/05/2021    1:23 PM  Depression screen PHQ 2/9  Decreased Interest 0 0 0 0 0  Down, Depressed, Hopeless 0 0 0 0 0  PHQ - 2 Score 0 0 0 0 0  Altered sleeping 0 0  0 0  Tired, decreased energy 0 0  0 0  Change in appetite 0 0  0 0  Feeling bad or failure about yourself  0 0  0 0  Trouble concentrating 0 0  0 0  Moving slowly or fidgety/restless 0 0  0 0  Suicidal thoughts 0 0  0 0  PHQ-9 Score 0 0  0 0  Difficult doing work/chores Not difficult at all        phq 9 is negative   Fall Risk:    07/24/2022    8:11 AM 06/03/2022    1:15 PM 03/05/2022    2:05 PM 01/28/2022   10:57 AM 11/03/2021    1:49 PM  Fall Risk   Falls in the past year? 0 0 0 0 0  Number falls in past yr: 0  0 0 0  Injury with Fall? 0  0 0 0  Risk for fall due to : No  Fall Risks No Fall Risks No Fall Risks No Fall Risks No Fall Risks  Follow up Falls prevention discussed;Education provided;Falls evaluation completed Falls prevention discussed;Education  provided;Falls evaluation completed Falls evaluation completed Falls prevention discussed Falls prevention discussed      Functional Status Survey: Is the patient deaf or have difficulty hearing?: No Does the patient have difficulty seeing, even when wearing glasses/contacts?: No Does the patient have difficulty concentrating, remembering, or making decisions?: No Does the patient have difficulty walking or climbing stairs?: No Does the patient have difficulty dressing or bathing?: No Does the patient have difficulty doing errands alone such as visiting a doctor's office or shopping?: No    Assessment & Plan  1. Acute abdominal pain  - CT Abdomen Pelvis Wo Contrast; Future - CBC with Differential/Platelet; Future - Comprehensive metabolic panel; Future - Lipase, blood; Future  It may be secondary to taking GLP 1 agonist but unlikely since symptoms are still present and seems to be getting worse   2. Nausea and vomiting, unspecified vomiting type  - CT Abdomen Pelvis Wo Contrast; Future - CBC with Differential/Platelet; Future - Comprehensive metabolic panel; Future - Lipase, blood; Future  3. History of left hemicolectomy  - CT Abdomen Pelvis Wo Contrast; Future - CBC with Differential/Platelet; Future - Comprehensive metabolic panel; Future - Lipase, blood; Future  4. History of small bowel obstruction  - CT Abdomen Pelvis Wo Contrast; Future - CBC with Differential/Platelet; Future - Comprehensive metabolic panel; Future - Lipase, blood; Future

## 2022-08-03 ENCOUNTER — Ambulatory Visit (INDEPENDENT_AMBULATORY_CARE_PROVIDER_SITE_OTHER): Payer: Medicare Other | Admitting: Urology

## 2022-08-03 ENCOUNTER — Encounter: Payer: Self-pay | Admitting: Urology

## 2022-08-03 VITALS — BP 127/77 | HR 77 | Ht 72.0 in | Wt 205.0 lb

## 2022-08-03 DIAGNOSIS — N2889 Other specified disorders of kidney and ureter: Secondary | ICD-10-CM

## 2022-08-03 NOTE — Progress Notes (Signed)
08/03/2022 2:06 PM   Jeremy [name] [birth year] [id number]  Referring provider: Steele Sizer, MD [address] [place],  [place] [address]  Chief Complaint  Patient presents with   Other    HPI: Jeremy [name] is a 87 y.o. male referred for an abnormal MRI.  Incidentally noted on abdominal imaging ordered by GI to have mild right caliectasis and fullness of the renal pelvis on MRI He is asymptomatic and denies flank, abdominal or pelvic pain No bothersome LUTS or gross hematuria Underwent PVP, bladder diverticulectomy with right ureteral implant by Dr. Jacqlyn Larsen in 2013   PMH: Past Medical History:  Diagnosis Date   Allergy    Basal cell carcinoma 10/09/2021   right medial forehead at glabella, need treatment   Chronic kidney disease    Diabetes mellitus without complication ([place])    Diabetic neuropathy ([place])    Diverticula, colon 1980's   Diverticulosis    Enlarged prostate    GERD (gastroesophageal reflux disease)    Glaucoma    Hemorrhoids    High cholesterol    History of hiatal hernia    Hyperlipidemia    Hypertension    Obesity    Onychomycosis    Seizures ([place])    Small bowel obstruction ([place]) 1990's   Urination disorder     Surgical History: Past Surgical History:  Procedure Laterality Date   APPENDECTOMY     BLADDER DIVERTICULECTOMY  2013   CARPAL TUNNEL RELEASE  01/2022   revision right side   COLON SURGERY Left 1980's   COLONOSCOPY W/ POLYPECTOMY  1990's   Dr Sharlet Salina   COLONOSCOPY WITH PROPOFOL N/A 12/21/2014   Procedure: COLONOSCOPY WITH PROPOFOL;  Surgeon: Lollie Sails, MD;  Location: [facility] ENDOSCOPY;  Service: Endoscopy;  Laterality: N/A;   COLONOSCOPY WITH PROPOFOL N/A 08/16/2017   Procedure: COLONOSCOPY WITH PROPOFOL;  Surgeon: Lollie Sails, MD;  Location: [facility] ENDOSCOPY;  Service: Endoscopy;  Laterality: N/A;   COLONOSCOPY WITH PROPOFOL N/A 01/31/2021   Procedure: COLONOSCOPY WITH PROPOFOL;  Surgeon: Robert Bellow, MD;  Location: [facility] ENDOSCOPY;  Service: Endoscopy;  Laterality: N/A;   ESOPHAGOGASTRODUODENOSCOPY (EGD) WITH PROPOFOL N/A 02/15/2015   Procedure: ESOPHAGOGASTRODUODENOSCOPY (EGD) WITH PROPOFOL;  Surgeon: Lollie Sails, MD;  Location: [facility] ENDOSCOPY;  Service: Endoscopy;  Laterality: N/A;   ESOPHAGOGASTRODUODENOSCOPY (EGD) WITH PROPOFOL N/A 06/10/2017   Procedure: ESOPHAGOGASTRODUODENOSCOPY (EGD) WITH PROPOFOL;  Surgeon: Lollie Sails, MD;  Location: [facility] ENDOSCOPY;  Service: Endoscopy;  Laterality: N/A;   ESOPHAGOGASTRODUODENOSCOPY (EGD) WITH PROPOFOL N/A 08/16/2017   Procedure: ESOPHAGOGASTRODUODENOSCOPY (EGD) WITH PROPOFOL;  Surgeon: Lollie Sails, MD;  Location: [facility] ENDOSCOPY;  Service: Endoscopy;  Laterality: N/A;   ESOPHAGOGASTRODUODENOSCOPY (EGD) WITH PROPOFOL N/A 01/31/2021   Procedure: ESOPHAGOGASTRODUODENOSCOPY (EGD) WITH PROPOFOL;  Surgeon: Robert Bellow, MD;  Location: [facility] ENDOSCOPY;  Service: Endoscopy;  Laterality: N/A;   EYE SURGERY     GLAUCOMA SURGERY Left 1990's   [facility]   HEMORROIDECTOMY  1980's   [facility]   HERNIA REPAIR     [facility], Dr Alfredia Client HERNIA REPAIR Bilateral [phone]   LARYNX SURGERY  1990   3 times   MOHS SURGERY  01/22/2022   Dr. Purcell Nails - [facility]   RETINAL DETACHMENT SURGERY Right    TONSILLECTOMY     turp  2013   URETER SURGERY  2013   Dr Jacqlyn Larsen    Home Medications:  Allergies as of 08/03/2022       Reactions  Demerol [meperidine] Other (See Comments), Anaphylaxis   MAKES BLOOD PRESSURE BOTTOM OUT hypotension   Dilaudid [hydromorphone Hcl]    Hydromorphone    Other reaction(s): Unknown MAKES BLOOD PRESSURE BOTTOM OUT   Meperidine Hcl    Actos [pioglitazone] Other (See Comments), Hives   Dapagliflozin Rash   Penicillins Rash        Medication List        Accurate as of August 03, 2022  2:06 PM. If you have any questions, ask your nurse or doctor.          bisoprolol 10 MG  tablet Commonly known as: ZEBETA Take 1 tablet (10 mg total) by mouth daily.   brimonidine 0.1 % Soln Commonly known as: ALPHAGAN P Place 1 drop into the left eye 2 (two) times daily.   Bydureon BCise 2 MG/0.85ML Auij Generic drug: Exenatide ER Inject 2 mg into the skin once a week.   desonide 0.05 % ointment Commonly known as: DESOWEN Apply to rash under eye QD-BID PRN   dorzolamide 2 % ophthalmic solution Commonly known as: TRUSOPT Place 1 drop into the left eye 2 (two) times daily.   finasteride 5 MG tablet Commonly known as: PROSCAR Take 1 tablet by mouth once daily   glipiZIDE 2.5 MG 24 hr tablet Commonly known as: GLUCOTROL XL Take 2.5 mg by mouth daily with breakfast.   hydrocortisone 2.5 % cream APPLY TO THE AFFECTED AREAS OF FACE AND EARS EVERY OTHER NIGHT. ALTERNATING WITH KETOCONAZOLE CREAM   ketoconazole 2 % cream Commonly known as: NIZORAL Apply to aa's face and ears every other night alternating with Hydrocortisone cream.   ketoconazole 2 % shampoo Commonly known as: NIZORAL Apply 1 application topically 3 (three) times a week. Shampoo scalp 3 days a week let sit 5 minutes and rinse off   metFORMIN 1000 MG tablet Commonly known as: GLUCOPHAGE Take 1,000 mg by mouth 2 (two) times daily with a meal.   mometasone 0.1 % lotion Commonly known as: ELOCON APPLY TO THE AFFECTED AREAS OF SCALP THREE NIGHTS PER WEEK   mometasone 0.1 % cream Commonly known as: ELOCON Apply topically daily.   omega-3 acid ethyl esters 1 g capsule Commonly known as: LOVAZA Take 2 capsules (2 g total) by mouth 2 (two) times daily.   omeprazole 20 MG capsule Commonly known as: PRILOSEC Take 1 capsule (20 mg total) by mouth daily.   rosuvastatin 10 MG tablet Commonly known as: CRESTOR Take 1 tablet (10 mg total) by mouth at bedtime.   tadalafil 20 MG tablet Commonly known as: CIALIS Take 0.5-1 tablets (10-20 mg total) by mouth every other day as needed for erectile  dysfunction.   telmisartan-hydrochlorothiazide 80-12.5 MG tablet Commonly known as: MICARDIS HCT Take 1 tablet by mouth daily.   timolol 0.5 % ophthalmic solution Commonly known as: TIMOPTIC INSTILL 1 DROP INTO LEFT EYE TWICE DAILY   Tresiba FlexTouch 200 UNIT/ML FlexTouch Pen Generic drug: insulin degludec Inject 45 Units into the skin in the morning.        Allergies:  Allergies  Allergen Reactions   Demerol [Meperidine] Other (See Comments) and Anaphylaxis    MAKES BLOOD PRESSURE BOTTOM OUT hypotension   Dilaudid [Hydromorphone Hcl]    Hydromorphone     Other reaction(s): Unknown MAKES BLOOD PRESSURE BOTTOM OUT   Meperidine Hcl    Actos [Pioglitazone] Other (See Comments) and Hives   Dapagliflozin Rash   Penicillins Rash    Family History: Family History  Problem  Relation Age of Onset   Diabetes Mother    Cataracts Mother    Metabolic syndrome Mother    Blindness Mother        r/t diabetes   Cataracts Father    Glaucoma Father        Retina-Detachment   Atrial fibrillation Father    Diverticulitis Sister    Glaucoma Brother    Diabetes Brother    Cancer Sister        Gallbladder   Cancer Daughter     Social History:  reports that he has never smoked. He has never used smokeless tobacco. He reports that he does not drink alcohol and does not use drugs.   Physical Exam: BP 127/77   Pulse 77   Ht 6' (1.829 m)   Wt 205 lb (93 kg)   BMI 27.80 kg/m   Constitutional:  Alert, No acute distress. HEENT: [place] AT, moist mucus membranes.  Trachea midline, no masses. Cardiovascular: No clubbing, cyanosis, or edema. Respiratory: Normal respiratory effort, no increased work of breathing. Psychiatric: Normal mood and affect.   Pertinent Imaging: CT/MRI images were personally reviewed and interpreted   Assessment & Plan:   Mild right caliectasis and fullness of renal pelvis to UPJ.  We discussed this may be secondary to vesicoureteral reflux post ureteral  reimplant Recommend Lasix renogram to make sure no evidence of obstruction.  Will call with results   Abbie Sons, MD  [facility] [address] [place], [place] [address] [phone]

## 2022-08-22 ENCOUNTER — Other Ambulatory Visit: Payer: Self-pay | Admitting: Family Medicine

## 2022-09-01 ENCOUNTER — Other Ambulatory Visit: Payer: Self-pay | Admitting: Family Medicine

## 2022-09-01 ENCOUNTER — Encounter
Admission: RE | Admit: 2022-09-01 | Discharge: 2022-09-01 | Disposition: A | Discharge status: Home / Self Care | Payer: Medicare Other | Source: Ambulatory Visit | Attending: Urology | Admitting: Urology

## 2022-09-01 DIAGNOSIS — E1129 Type 2 diabetes mellitus with other diabetic kidney complication: Secondary | ICD-10-CM

## 2022-09-01 DIAGNOSIS — N2889 Other specified disorders of kidney and ureter: Secondary | ICD-10-CM

## 2022-09-01 DIAGNOSIS — I152 Hypertension secondary to endocrine disorders: Secondary | ICD-10-CM

## 2022-09-01 MED ORDER — FUROSEMIDE 10 MG/ML IJ SOLN
46.6000 mg | INTRAMUSCULAR | Status: DC
  Filled 2022-09-01 (×2): qty 4.7

## 2022-09-01 MED ORDER — TECHNETIUM TC 99M MERTIATIDE
5.2200 | Freq: Once | INTRAVENOUS | Status: AC | PRN
  Administered 2022-09-01: 5.22 via INTRAVENOUS

## 2022-09-02 DIAGNOSIS — E1142 Type 2 diabetes mellitus with diabetic polyneuropathy: Secondary | ICD-10-CM | POA: Diagnosis not present

## 2022-09-02 DIAGNOSIS — B351 Tinea unguium: Secondary | ICD-10-CM | POA: Diagnosis not present

## 2022-09-08 ENCOUNTER — Telehealth: Payer: Self-pay | Admitting: Family Medicine

## 2022-09-08 NOTE — Telephone Encounter (Signed)
Patient called and states he has been on Jardiance for about 1 year. He states he started having a necrotic infection a few weeks ago. He has been using Ketoconazole cream and it was helping at first but it is coming back now. An appointment has been made for evaluation.

## 2022-09-09 NOTE — Progress Notes (Signed)
09/10/2022 2:41 PM   Jeremy [name] 03-May-1936 [id number]  Referring provider: Steele Sizer, MD [address] [place],  [place] [address]  Urological history: 1. BPH with LU TS -PSA (04/2020) 0.05 -PVP, bladder diverticulectomy and right ureteral reimplant by Dr. Jacqlyn Larsen in September 2013 -finasteride 5 mg three times weekly  2. Vesicoureteral reflux -secondary to ureter reimplant -MR (05/2022) - Minimal right-sided caliectasis without hydroureter, of indeterminate clinical significance -renal lasix scan (08/2022) - no evidence of obstruction  HPI: Jeremy [name] is a 87 y.o. male who presents today for penile infection.   Patient has been experiencing a penile rash that he has been able to control with ketoconazole 2 % cream, but it has been recurring even with the use of the cream.  He is uncircumcised and is on Jardiance for his diabetes.  Patient denies any modifying or aggravating factors.  Patient denies any gross hematuria, dysuria or suprapubic/flank pain.  Patient denies any fevers, chills, nausea or vomiting.    PMH: Past Medical History:  Diagnosis Date   Allergy    Basal cell carcinoma 10/09/2021   right medial forehead at glabella, need treatment   Chronic kidney disease    Diabetes mellitus without complication ([place])    Diabetic neuropathy ([place])    Diverticula, colon 1980's   Diverticulosis    Enlarged prostate    GERD (gastroesophageal reflux disease)    Glaucoma    Hemorrhoids    High cholesterol    History of hiatal hernia    Hyperlipidemia    Hypertension    Obesity    Onychomycosis    Seizures ([place])    Small bowel obstruction ([place]) 1990's   Urination disorder     Surgical History: Past Surgical History:  Procedure Laterality Date   APPENDECTOMY     BLADDER DIVERTICULECTOMY  2013   CARPAL TUNNEL RELEASE  01/2022   revision right side   COLON SURGERY Left 1980's   COLONOSCOPY W/ POLYPECTOMY  1990's   Dr Sharlet Salina    COLONOSCOPY WITH PROPOFOL N/A 12/21/2014   Procedure: COLONOSCOPY WITH PROPOFOL;  Surgeon: Lollie Sails, MD;  [facility] ENDOSCOPY;  Service: Endoscopy;  Laterality: N/A;   COLONOSCOPY WITH PROPOFOL N/A 08/16/2017   Procedure: COLONOSCOPY WITH PROPOFOL;  Surgeon: Lollie Sails, MD;  Location: [facility] ENDOSCOPY;  Service: Endoscopy;  Laterality: N/A;   COLONOSCOPY WITH PROPOFOL N/A 01/31/2021   Procedure: COLONOSCOPY WITH PROPOFOL;  Surgeon: Robert Bellow, MD;  Location: [facility] ENDOSCOPY;  Service: Endoscopy;  Laterality: N/A;   ESOPHAGOGASTRODUODENOSCOPY (EGD) WITH PROPOFOL N/A 02/15/2015   Procedure: ESOPHAGOGASTRODUODENOSCOPY (EGD) WITH PROPOFOL;  Surgeon: Lollie Sails, MD;  Location: [facility] ENDOSCOPY;  Service: Endoscopy;  Laterality: N/A;   ESOPHAGOGASTRODUODENOSCOPY (EGD) WITH PROPOFOL N/A 06/10/2017   Procedure: ESOPHAGOGASTRODUODENOSCOPY (EGD) WITH PROPOFOL;  Surgeon: Lollie Sails, MD;  Location: [facility] ENDOSCOPY;  Service: Endoscopy;  Laterality: N/A;   ESOPHAGOGASTRODUODENOSCOPY (EGD) WITH PROPOFOL N/A 08/16/2017   Procedure: ESOPHAGOGASTRODUODENOSCOPY (EGD) WITH PROPOFOL;  Surgeon: Lollie Sails, MD;  Location[facility] ENDOSCOPY;  Service: Endoscopy;  Laterality: N/A;   ESOPHAGOGASTRODUODENOSCOPY (EGD) WITH PROPOFOL N/A 01/31/2021   Procedure: ESOPHAGOGASTRODUODENOSCOPY (EGD) WITH PROPOFOL;  Surgeon: Robert Bellow, MD;  Location: [facility] ENDOSCOPY;  Service: Endoscopy;  Laterality: N/A;   EYE SURGERY     GLAUCOMA SURGERY Left 1990's   [facility]   HEMORROIDECTOMY  1980's   [facility]   HERNIA REPAIR     [facility], Dr Alfredia Client HERNIA  REPAIR Bilateral 1960's   LARYNX SURGERY  1990   3 times   MOHS SURGERY  01/22/2022   Dr. Purcell Nails - [facility]   RETINAL DETACHMENT SURGERY Right    TONSILLECTOMY     turp  2013   URETER SURGERY  2013   Dr Jacqlyn Larsen    Home Medications:  Allergies as of 09/10/2022       Reactions   Demerol [meperidine] Other  (See Comments), Anaphylaxis   MAKES BLOOD PRESSURE BOTTOM OUT hypotension   Dilaudid [hydromorphone Hcl]    Hydromorphone    Other reaction(s): Unknown MAKES BLOOD PRESSURE BOTTOM OUT   Meperidine Hcl    Actos [pioglitazone] Other (See Comments), Hives   Dapagliflozin Rash   Penicillins Rash        Medication List        Accurate as of September 10, 2022  2:41 PM. If you have any questions, ask your nurse or doctor.          bisoprolol 10 MG tablet Commonly known as: ZEBETA Take 1 tablet (10 mg total) by mouth daily.   brimonidine 0.1 % Soln Commonly known as: ALPHAGAN P Place 1 drop into the left eye 2 (two) times daily.   Bydureon BCise 2 MG/0.85ML Auij Generic drug: Exenatide ER Inject 2 mg into the skin once a week.   desonide 0.05 % ointment Commonly known as: DESOWEN Apply to rash under eye QD-BID PRN   dorzolamide 2 % ophthalmic solution Commonly known as: TRUSOPT Place 1 drop into the left eye 2 (two) times daily.   finasteride 5 MG tablet Commonly known as: PROSCAR Take 1 tablet by mouth once daily   fluconazole 100 MG tablet Commonly known as: DIFLUCAN Take 1 tablet (100 mg total) by mouth daily. X 7 days   glipiZIDE 2.5 MG 24 hr tablet Commonly known as: GLUCOTROL XL Take 2.5 mg by mouth daily with breakfast.   hydrocortisone 2.5 % cream APPLY TO THE AFFECTED AREAS OF FACE AND EARS EVERY OTHER NIGHT. ALTERNATING WITH KETOCONAZOLE CREAM   ketoconazole 2 % shampoo Commonly known as: NIZORAL Apply 1 application topically 3 (three) times a week. Shampoo scalp 3 days a week let sit 5 minutes and rinse off What changed: Another medication with the same name was changed. Make sure you understand how and when to take each.   ketoconazole 2 % cream Commonly known as: NIZORAL Apply to penis twice daily What changed: additional instructions   metFORMIN 1000 MG tablet Commonly known as: GLUCOPHAGE Take 1,000 mg by mouth 2 (two) times daily with a  meal.   mometasone 0.1 % lotion Commonly known as: ELOCON APPLY TO THE AFFECTED AREAS OF SCALP THREE NIGHTS PER WEEK   mometasone 0.1 % cream Commonly known as: ELOCON Apply topically daily.   omega-3 acid ethyl esters 1 g capsule Commonly known as: LOVAZA Take 2 capsules (2 g total) by mouth 2 (two) times daily.   omeprazole 20 MG capsule Commonly known as: PRILOSEC Take 1 capsule by mouth once daily   rosuvastatin 10 MG tablet Commonly known as: CRESTOR Take 1 tablet (10 mg total) by mouth at bedtime.   tadalafil 20 MG tablet Commonly known as: CIALIS Take 0.5-1 tablets (10-20 mg total) by mouth every other day as needed for erectile dysfunction.   telmisartan-hydrochlorothiazide 80-12.5 MG tablet Commonly known as: MICARDIS HCT Take 1 tablet by mouth once daily   timolol 0.5 % ophthalmic solution Commonly known as: TIMOPTIC INSTILL 1 DROP INTO  LEFT EYE TWICE DAILY   Tyler Aas FlexTouch 200 UNIT/ML FlexTouch Pen Generic drug: insulin degludec Inject 45 Units into the skin in the morning.        Allergies:  Allergies  Allergen Reactions   Demerol [Meperidine] Other (See Comments) and Anaphylaxis    MAKES BLOOD PRESSURE BOTTOM OUT hypotension   Dilaudid [Hydromorphone Hcl]    Hydromorphone     Other reaction(s): Unknown MAKES BLOOD PRESSURE BOTTOM OUT   Meperidine Hcl    Actos [Pioglitazone] Other (See Comments) and Hives   Dapagliflozin Rash   Penicillins Rash    Family History: Family History  Problem Relation Age of Onset   Diabetes Mother    Cataracts Mother    Metabolic syndrome Mother    Blindness Mother        r/t diabetes   Cataracts Father    Glaucoma Father        Retina-Detachment   Atrial fibrillation Father    Diverticulitis Sister    Glaucoma Brother    Diabetes Brother    Cancer Sister        Gallbladder   Cancer Daughter     Social History:  reports that he has never smoked. He has never used smokeless tobacco. He reports  that he does not drink alcohol and does not use drugs.  ROS: Pertinent ROS in HPI  Physical Exam: BP (!) 142/78   Pulse 80   Ht 6' 3.5" (1.918 m)   Wt 205 lb (93 kg)   BMI 25.28 kg/m   Constitutional:  Well nourished. Alert and oriented, No acute distress. HEENT: [place] AT, moist mucus membranes.  Trachea midline Cardiovascular: No clubbing, cyanosis, or edema. Respiratory: Normal respiratory effort, no increased work of breathing. GU: No CVA tenderness.  No bladder fullness or masses.  Patient with uncircumcised phallus. Foreskin easily retracted.foreskin is inflamed with a candidal rash.  Urethral meatus is patent.  No penile discharge. No penile lesions or rashes. Scrotum without lesions, cysts, rashes and/or edema.   Neurologic: Grossly intact, no focal deficits, moving all 4 extremities. Psychiatric: Normal mood and affect.  Laboratory Data: Lab Results  Component Value Date   WBC 7.8 07/24/2022   HGB 15.8 07/24/2022   HCT 47.3 07/24/2022   MCV 107.0 (H) 07/24/2022   PLT 105 (L) 07/24/2022    Lab Results  Component Value Date   CREATININE 0.84 07/24/2022    Lab Results  Component Value Date   PSA 0.05 05/01/2020   PSA <0.1 05/17/2018    Lab Results  Component Value Date   HGBA1C 8.2 02/25/2022       Component Value Date/Time   CHOL 68 06/03/2022 1404   HDL 41 06/03/2022 1404   CHOLHDL 1.7 06/03/2022 1404   VLDL 37 (H) 01/08/2017 1228   LDLCALC 11 06/03/2022 1404    Lab Results  Component Value Date   AST 45 (H) 07/24/2022   Lab Results  Component Value Date   ALT 42 07/24/2022  I have reviewed the labs.   Pertinent Imaging: N/A  Assessment & Plan:    1.  Balanoposthitis  -Prescription given for fluconazole 100 mg once daily for 7 days -Given refill on the ketoconazole cream to apply twice daily  Return in about 2 weeks (around 09/24/2022) for recheck .  These notes generated with voice recognition software. I apologize for typographical  errors.  Zara Council, PA-C  [place] [address] [place], [place] [address] [phone]  [phone]  

## 2022-09-10 ENCOUNTER — Encounter: Payer: Self-pay | Admitting: Urology

## 2022-09-10 ENCOUNTER — Ambulatory Visit (INDEPENDENT_AMBULATORY_CARE_PROVIDER_SITE_OTHER): Payer: Medicare Other | Admitting: Urology

## 2022-09-10 VITALS — BP 142/78 | HR 80 | Ht 75.5 in | Wt 205.0 lb

## 2022-09-10 DIAGNOSIS — N476 Balanoposthitis: Secondary | ICD-10-CM | POA: Diagnosis not present

## 2022-09-10 DIAGNOSIS — Z23 Encounter for immunization: Secondary | ICD-10-CM | POA: Diagnosis not present

## 2022-09-10 DIAGNOSIS — L219 Seborrheic dermatitis, unspecified: Secondary | ICD-10-CM

## 2022-09-10 DIAGNOSIS — N481 Balanitis: Secondary | ICD-10-CM

## 2022-09-10 MED ORDER — KETOCONAZOLE 2 % EX CREA: TOPICAL_CREAM | CUTANEOUS | 3 refills | Status: DC

## 2022-09-10 MED ORDER — FLUCONAZOLE 100 MG PO TABS: 100.0000 mg | ORAL_TABLET | Freq: Every day | ORAL | 0 refills | Status: DC

## 2022-09-10 MED ORDER — FLUCONAZOLE 100 MG PO TABS: 100.0000 mg | ORAL_TABLET | Freq: Every day | ORAL | 1 refills | Status: DC

## 2022-09-23 NOTE — Progress Notes (Signed)
09/24/2022 8:02 PM   Jeremy [name] 05-04-1936 [id number]  Referring provider: Alba Cory, MD [address] [place],  [place] [address]  Urological history: 1. BPH with LU TS -PSA (04/2020) 0.05 -PVP, bladder diverticulectomy and right ureteral reimplant by Dr. Achilles Dunk in September 2013 -finasteride 5 mg three times weekly  2. Vesicoureteral reflux -secondary to ureter reimplant -MR (05/2022) - Minimal right-sided caliectasis without hydroureter, of indeterminate clinical significance -renal lasix scan (08/2022) - no evidence of obstruction  HPI: Jeremy [name] is a 87 y.o. male who presents today for 2-week recheck on balanoposthitis.    At his visit on 09/10/2022, he had been experiencing a penile rash that he had been able to control with ketoconazole 2 % cream, but it had been recurring even with the use of the cream.  He is uncircumcised and is on Jardiance for his diabetes.  Patient denied any modifying or aggravating factors.  Patient denies any gross hematuria, dysuria or suprapubic/flank pain.  Patient denied any fevers, chills, nausea or vomiting.  His exam was consistent with balanoposthitis.  He was given a prescription for the fluconazole 100 mg once daily for 7 days and a refill on his ketoconazole cream to apply daily to his penis.  Today, he states the rash on the foreskin and head of his penis have completely resolved.  He is concerned about the rash returning as he will continue to take the Jardiance.  Patient denies any modifying or aggravating factors.  Patient denies any gross hematuria, dysuria or suprapubic/flank pain.  Patient denies any fevers, chills, nausea or vomiting.    PMH: Past Medical History:  Diagnosis Date   Allergy    Basal cell carcinoma 10/09/2021   right medial forehead at glabella, need treatment   Chronic kidney disease    Diabetes mellitus without complication    Diabetic neuropathy    Diverticula, colon 1980's    Diverticulosis    Enlarged prostate    GERD (gastroesophageal reflux disease)    Glaucoma    Hemorrhoids    High cholesterol    History of hiatal hernia    Hyperlipidemia    Hypertension    Obesity    Onychomycosis    Seizures    Small bowel obstruction 1990's   Urination disorder     Surgical History: Past Surgical History:  Procedure Laterality Date   APPENDECTOMY     BLADDER DIVERTICULECTOMY  2013   CARPAL TUNNEL RELEASE  01/2022   revision right side   COLON SURGERY Left 1980's   COLONOSCOPY W/ POLYPECTOMY  1990's   Dr Okey Dupre   COLONOSCOPY WITH PROPOFOL N/A 12/21/2014   Procedure: COLONOSCOPY WITH PROPOFOL;  Surgeon: Christena Deem, MD;  Location[facility] ENDOSCOPY;  Service: Endoscopy;  Laterality: N/A;   COLONOSCOPY WITH PROPOFOL N/A 08/16/2017   Procedure: COLONOSCOPY WITH PROPOFOL;  Surgeon: Christena Deem, MD;  Location: [facility] ENDOSCOPY;  Service: Endoscopy;  Laterality: N/A;   COLONOSCOPY WITH PROPOFOL N/A 01/31/2021   Procedure: COLONOSCOPY WITH PROPOFOL;  Surgeon: Earline Mayotte, MD;  Location: [facility] ENDOSCOPY;  Service: Endoscopy;  Laterality: N/A;   ESOPHAGOGASTRODUODENOSCOPY (EGD) WITH PROPOFOL N/A 02/15/2015   Procedure: ESOPHAGOGASTRODUODENOSCOPY (EGD) WITH PROPOFOL;  Surgeon: Christena Deem, MD;  Location: [facility] ENDOSCOPY;  Service: Endoscopy;  Laterality: N/A;   ESOPHAGOGASTRODUODENOSCOPY (EGD) WITH PROPOFOL N/A 06/10/2017   Procedure: ESOPHAGOGASTRODUODENOSCOPY (EGD) WITH PROPOFOL;  Surgeon: Christena Deem, MD;  Location: [facility] ENDOSCOPY;  Service: Endoscopy;  Laterality: N/A;  ESOPHAGOGASTRODUODENOSCOPY (EGD) WITH PROPOFOL N/A 08/16/2017   Procedure: ESOPHAGOGASTRODUODENOSCOPY (EGD) WITH PROPOFOL;  Surgeon: Christena Deem, MD;  Location: [facility] ENDOSCOPY;  Service: Endoscopy;  Laterality: N/A;   ESOPHAGOGASTRODUODENOSCOPY (EGD) WITH PROPOFOL N/A 01/31/2021   Procedure: ESOPHAGOGASTRODUODENOSCOPY (EGD) WITH PROPOFOL;  Surgeon: Earline Mayotte, MD;  Location: [facility] ENDOSCOPY;  Service: Endoscopy;  Laterality: N/A;   EYE SURGERY     GLAUCOMA SURGERY Left 1990's   [facility]   HEMORROIDECTOMY  1980's   [facility]   HERNIA REPAIR     [facility], Dr Carol Ada HERNIA REPAIR Bilateral [phone]   LARYNX SURGERY  1990   3 times   MOHS SURGERY  01/22/2022   Dr. Verdell Face - [facility]   RETINAL DETACHMENT SURGERY Right    TONSILLECTOMY     turp  2013   URETER SURGERY  2013   Dr Achilles Dunk    Home Medications:  Allergies as of 09/24/2022       Reactions   Demerol [meperidine] Other (See Comments), Anaphylaxis   MAKES BLOOD PRESSURE BOTTOM OUT hypotension   Dilaudid [hydromorphone Hcl]    Hydromorphone    Other reaction(s): Unknown MAKES BLOOD PRESSURE BOTTOM OUT   Meperidine Hcl    Actos [pioglitazone] Other (See Comments), Hives   Dapagliflozin Rash   Penicillins Rash        Medication List        Accurate as of September 24, 2022 11:59 PM. If you have any questions, ask your nurse or doctor.          bisoprolol 10 MG tablet Commonly known as: ZEBETA Take 1 tablet (10 mg total) by mouth daily.   brimonidine 0.1 % Soln Commonly known as: ALPHAGAN P Place 1 drop into the left eye 2 (two) times daily.   Bydureon BCise 2 MG/0.85ML Auij Generic drug: Exenatide ER Inject 2 mg into the skin once a week.   desonide 0.05 % ointment Commonly known as: DESOWEN Apply to rash under eye QD-BID PRN   dorzolamide 2 % ophthalmic solution Commonly known as: TRUSOPT Place 1 drop into the left eye 2 (two) times daily.   finasteride 5 MG tablet Commonly known as: PROSCAR Take 1 tablet by mouth once daily   fluconazole 100 MG tablet Commonly known as: DIFLUCAN Take 1 tablet (100 mg total) by mouth daily. X 7 days   glipiZIDE 2.5 MG 24 hr tablet Commonly known as: GLUCOTROL XL Take 2.5 mg by mouth daily with breakfast.   hydrocortisone 2.5 % cream APPLY TO THE AFFECTED AREAS OF FACE AND EARS EVERY  OTHER NIGHT. ALTERNATING WITH KETOCONAZOLE CREAM   Jardiance 25 MG Tabs tablet Generic drug: empagliflozin Take by mouth daily.   ketoconazole 2 % shampoo Commonly known as: NIZORAL Apply 1 application topically 3 (three) times a week. Shampoo scalp 3 days a week let sit 5 minutes and rinse off   ketoconazole 2 % cream Commonly known as: NIZORAL Apply to penis twice daily   metFORMIN 1000 MG tablet Commonly known as: GLUCOPHAGE Take 1,000 mg by mouth 2 (two) times daily with a meal.   mometasone 0.1 % lotion Commonly known as: ELOCON APPLY TO THE AFFECTED AREAS OF SCALP THREE NIGHTS PER WEEK   mometasone 0.1 % cream Commonly known as: ELOCON Apply topically daily.   omega-3 acid ethyl esters 1 g capsule Commonly known as: LOVAZA Take 2 capsules (2 g total) by mouth 2 (two) times daily.   omeprazole 20 MG capsule Commonly  known as: PRILOSEC Take 1 capsule by mouth once daily   Ozempic (0.25 or 0.5 MG/DOSE) 2 MG/1.5ML Sopn Generic drug: Semaglutide(0.25 or 0.5MG /DOS) Inject into the skin.   rosuvastatin 10 MG tablet Commonly known as: CRESTOR Take 1 tablet (10 mg total) by mouth at bedtime.   tadalafil 20 MG tablet Commonly known as: CIALIS Take 0.5-1 tablets (10-20 mg total) by mouth every other day as needed for erectile dysfunction.   telmisartan-hydrochlorothiazide 80-12.5 MG tablet Commonly known as: MICARDIS HCT Take 1 tablet by mouth once daily   timolol 0.5 % ophthalmic solution Commonly known as: TIMOPTIC INSTILL 1 DROP INTO LEFT EYE TWICE DAILY   Tresiba FlexTouch 200 UNIT/ML FlexTouch Pen Generic drug: insulin degludec Inject 45 Units into the skin in the morning.        Allergies:  Allergies  Allergen Reactions   Demerol [Meperidine] Other (See Comments) and Anaphylaxis    MAKES BLOOD PRESSURE BOTTOM OUT hypotension   Dilaudid [Hydromorphone Hcl]    Hydromorphone     Other reaction(s): Unknown MAKES BLOOD PRESSURE BOTTOM OUT    Meperidine Hcl    Actos [Pioglitazone] Other (See Comments) and Hives   Dapagliflozin Rash   Penicillins Rash    Family History: Family History  Problem Relation Age of Onset   Diabetes Mother    Cataracts Mother    Metabolic syndrome Mother    Blindness Mother        r/t diabetes   Cataracts Father    Glaucoma Father        Retina-Detachment   Atrial fibrillation Father    Diverticulitis Sister    Glaucoma Brother    Diabetes Brother    Cancer Sister        Gallbladder   Cancer Daughter     Social History:  reports that he has never smoked. He has never used smokeless tobacco. He reports that he does not drink alcohol and does not use drugs.  ROS: Pertinent ROS in HPI  Physical Exam: BP 105/70   Pulse 70   Ht 6' 3.5" (1.918 m)   Wt 205 lb (93 kg)   BMI 25.28 kg/m   Constitutional:  Well nourished. Alert and oriented, No acute distress. HEENT: [place] AT, moist mucus membranes.  Trachea midline Cardiovascular: No clubbing, cyanosis, or edema. Respiratory: Normal respiratory effort, no increased work of breathing. GU: No CVA tenderness.  No bladder fullness or masses.  Patient with uncircumcised phallus. Foreskin easily retracted Urethral meatus is patent.  No penile discharge. No penile lesions or rashes.  Neurologic: Grossly intact, no focal deficits, moving all 4 extremities. Psychiatric: Normal mood and affect.   Laboratory Data: N/A  Pertinent Imaging: N/A  Assessment & Plan:    1.  Balanoposthitis  -resolved at this time -Prescription given for fluconazole 100 mg once daily for 7 days to have on hand for flare ups -continue ketoconazole cream to apply twice daily  Return if symptoms worsen or fail to improve.  These notes generated with voice recognition software. I apologize for typographical errors.  Cloretta NedSHANNON Chevez Sambrano, PA-C  [facility] [address]  [address] [place], [place] [address] [phone]

## 2022-09-24 ENCOUNTER — Ambulatory Visit (INDEPENDENT_AMBULATORY_CARE_PROVIDER_SITE_OTHER): Payer: Medicare Other | Admitting: Urology

## 2022-09-24 ENCOUNTER — Encounter: Payer: Self-pay | Admitting: Urology

## 2022-09-24 VITALS — BP 105/70 | HR 70 | Ht 75.5 in | Wt 205.0 lb

## 2022-09-24 DIAGNOSIS — N481 Balanitis: Secondary | ICD-10-CM

## 2022-09-24 DIAGNOSIS — N476 Balanoposthitis: Secondary | ICD-10-CM

## 2022-09-24 DIAGNOSIS — Z87438 Personal history of other diseases of male genital organs: Secondary | ICD-10-CM | POA: Diagnosis not present

## 2022-09-24 MED ORDER — FLUCONAZOLE 100 MG PO TABS: 100.0000 mg | ORAL_TABLET | Freq: Every day | ORAL | 1 refills | Status: DC

## 2022-09-25 ENCOUNTER — Other Ambulatory Visit: Payer: Self-pay | Admitting: Dermatology

## 2022-09-25 DIAGNOSIS — L219 Seborrheic dermatitis, unspecified: Secondary | ICD-10-CM

## 2022-09-25 DIAGNOSIS — M170 Bilateral primary osteoarthritis of knee: Secondary | ICD-10-CM | POA: Diagnosis not present

## 2022-10-01 ENCOUNTER — Other Ambulatory Visit: Payer: Self-pay

## 2022-10-01 DIAGNOSIS — L219 Seborrheic dermatitis, unspecified: Secondary | ICD-10-CM

## 2022-10-01 DIAGNOSIS — L244 Irritant contact dermatitis due to drugs in contact with skin: Secondary | ICD-10-CM

## 2022-10-01 MED ORDER — KETOCONAZOLE 2 % EX SHAM: 1.0000 | MEDICATED_SHAMPOO | CUTANEOUS | 3 refills | Status: DC

## 2022-10-01 MED ORDER — KETOCONAZOLE 2 % EX CREA: TOPICAL_CREAM | CUTANEOUS | 3 refills | Status: DC

## 2022-10-01 MED ORDER — DESONIDE 0.05 % EX OINT: TOPICAL_OINTMENT | CUTANEOUS | 1 refills | Status: DC

## 2022-10-01 NOTE — Telephone Encounter (Signed)
Patient called requesting refills of Ketoconazole cream, Ketoconazole shampoo and Desonide ointment, ok rx's sent to [address]

## 2022-10-02 NOTE — Progress Notes (Unsigned)
Name: Jeremy [name]   MRN: [id number]    DOB: April 15, 1936   Date:10/05/2022       Progress Note  Subjective  Chief Complaint  Knee Pain  HPI  Right knee pain: symptoms started early April with swelling and pain, no redness or increase in warmth, he tried to come in to see me but today was the soonest available visit. He was able to see Dr. Hyacinth Meeker at [facility]. He had an steroid injection and fluid was drained and sent to the lab but results not back or unable to see on Epic. He states pain is intermittent, worse with ambulation , initially was waking in him up during the night. Currently taking naproxen twice daily and tylenol to control pain. Pain can go up to 7/10. At rest today pain is 5/10. He has normal kidney function. He states Dr. Hyacinth Meeker gave him Meloxicam 7.5 mg but feels better with Naproxen twice daily   DM type II: he states since steroid injections glucose has been high, but improving this week. He has an upcoming visit with Endo    Patient Active Problem List   Diagnosis Date Noted   Thrombocytopenia 06/03/2022   B12 deficiency 06/03/2022   Basal cell carcinoma (BCC) of skin of face 11/03/2021   Dyslipidemia associated with type 2 diabetes mellitus 11/03/2021   Impingement syndrome of shoulder region 11/16/2018   Atherosclerosis of aorta 07/21/2018   History of diverticulitis of colon 11/15/2017   Hyperlipidemia due to type 2 diabetes mellitus 03/14/2017   Bilateral lower extremity edema 03/27/2016   Vitamin D deficiency 12/05/2015   Hx of transient ischemic attack (TIA) 09/17/2015   Diabetes mellitus type 2 without retinopathy 05/31/2015   Hyperlipidemia 05/30/2015   Type 2 diabetes mellitus with renal manifestations (HCC) 01/08/2015   Incisional hernia, without obstruction or gangrene 03/23/2013   Enlarged prostate with lower urinary tract symptoms (LUTS) 11/03/2012   Myopic degeneration, bilateral 08/12/2012   Nonexudative age-related macular degeneration  07/15/2012   Anisometropia 04/08/2012   Pseudoaphakia 04/08/2012   History of retinal detachment 04/08/2012   Presence of intraocular lens 04/08/2012   Secondary open-angle glaucoma of left eye, severe stage 01/13/2012   Benign essential HTN 02/03/2007   Hypertension associated with diabetes 02/03/2007    Past Surgical History:  Procedure Laterality Date   APPENDECTOMY     BLADDER DIVERTICULECTOMY  2013   CARPAL TUNNEL RELEASE  01/2022   revision right side   COLON SURGERY Left 1980's   COLONOSCOPY W/ POLYPECTOMY  1990's   Dr Okey Dupre   COLONOSCOPY WITH PROPOFOL N/A 12/21/2014   Procedure: COLONOSCOPY WITH PROPOFOL;  Surgeon: Christena Deem, MD;  Location: [facility] ENDOSCOPY;  Service: Endoscopy;  Laterality: N/A;   COLONOSCOPY WITH PROPOFOL N/A 08/16/2017   Procedure: COLONOSCOPY WITH PROPOFOL;  Surgeon: Christena Deem, MD;  Location: [facility] ENDOSCOPY;  Service: Endoscopy;  Laterality: N/A;   COLONOSCOPY WITH PROPOFOL N/A 01/31/2021   Procedure: COLONOSCOPY WITH PROPOFOL;  Surgeon: Earline Mayotte, MD;  Location: [facility] ENDOSCOPY;  Service: Endoscopy;  Laterality: N/A;   ESOPHAGOGASTRODUODENOSCOPY (EGD) WITH PROPOFOL N/A 02/15/2015   Procedure: ESOPHAGOGASTRODUODENOSCOPY (EGD) WITH PROPOFOL;  Surgeon: Christena Deem, MD;  Location: [facility] ENDOSCOPY;  Service: Endoscopy;  Laterality: N/A;   ESOPHAGOGASTRODUODENOSCOPY (EGD) WITH PROPOFOL N/A 06/10/2017   Procedure: ESOPHAGOGASTRODUODENOSCOPY (EGD) WITH PROPOFOL;  Surgeon: Christena Deem, MD;  Location: [facility] ENDOSCOPY;  Service: Endoscopy;  Laterality: N/A;   ESOPHAGOGASTRODUODENOSCOPY (EGD) WITH PROPOFOL N/A 08/16/2017   Procedure: ESOPHAGOGASTRODUODENOSCOPY (EGD) WITH  PROPOFOL;  Surgeon: Christena Deem, MD;  Location: [facility] ENDOSCOPY;  Service: Endoscopy;  Laterality: N/A;   ESOPHAGOGASTRODUODENOSCOPY (EGD) WITH PROPOFOL N/A 01/31/2021   Procedure: ESOPHAGOGASTRODUODENOSCOPY (EGD) WITH PROPOFOL;  Surgeon: Earline Mayotte,  MD;  Location: [facility] ENDOSCOPY;  Service: Endoscopy;  Laterality: N/A;   EYE SURGERY     GLAUCOMA SURGERY Left 1990's   [facility]   HEMORROIDECTOMY  [phone]   [facility]   HERNIA REPAIR     [facility], Dr Carol Ada HERNIA REPAIR Bilateral [phone]   LARYNX SURGERY  1990   3 times   MOHS SURGERY  01/22/2022   Dr. Verdell Face - [facility]   RETINAL DETACHMENT SURGERY Right    TONSILLECTOMY     turp  2013   URETER SURGERY  2013   Dr Achilles Dunk    Family History  Problem Relation Age of Onset   Diabetes Mother    Cataracts Mother    Metabolic syndrome Mother    Blindness Mother        r/t diabetes   Cataracts Father    Glaucoma Father        Retina-Detachment   Atrial fibrillation Father    Diverticulitis Sister    Glaucoma Brother    Diabetes Brother    Cancer Sister        Gallbladder   Cancer Daughter     Social History   Tobacco Use   Smoking status: Never   Smokeless tobacco: Never   Tobacco comments:    smoking cessation materials not required  Substance Use Topics   Alcohol use: No     Current Outpatient Medications:    bisoprolol (ZEBETA) 10 MG tablet, Take 1 tablet (10 mg total) by mouth daily., Disp: 90 tablet, Rfl: 1   brimonidine (ALPHAGAN P) 0.1 % SOLN, Place 1 drop into the left eye 2 (two) times daily. , Disp: , Rfl:    desonide (DESOWEN) 0.05 % ointment, Apply to rash under eye QD-BID PRN, Disp: 15 g, Rfl: 1   dorzolamide (TRUSOPT) 2 % ophthalmic solution, Place 1 drop into the left eye 2 (two) times daily., Disp: , Rfl: 3   empagliflozin (JARDIANCE) 25 MG TABS tablet, Take by mouth daily., Disp: , Rfl:    Exenatide ER (BYDUREON BCISE) 2 MG/0.85ML AUIJ, Inject 2 mg into the skin once a week., Disp: , Rfl:    finasteride (PROSCAR) 5 MG tablet, Take 1 tablet by mouth once daily, Disp: 90 tablet, Rfl: 0   glipiZIDE (GLUCOTROL XL) 2.5 MG 24 hr tablet, Take 2.5 mg by mouth daily with breakfast., Disp: , Rfl:    hydrocortisone 2.5 % cream, APPLY  CREAM  TO AFFECTED AREAS OF FACE AND EARS EVERY OTHER NIGHT ALTERNATING WITH KETOCONAZOLE CREAM, Disp: 28 g, Rfl: 0   insulin degludec (TRESIBA FLEXTOUCH) 200 UNIT/ML FlexTouch Pen, Inject 45 Units into the skin in the morning., Disp: , Rfl:    ketoconazole (NIZORAL) 2 % cream, Apply to penis twice daily, Disp: 60 g, Rfl: 3   ketoconazole (NIZORAL) 2 % shampoo, Apply 1 Application topically 3 (three) times a week. Shampoo scalp 3 days a week let sit 5 minutes and rinse off, Disp: 120 mL, Rfl: 3   metFORMIN (GLUCOPHAGE) 1000 MG tablet, Take 1,000 mg by mouth 2 (two) times daily with a meal., Disp: , Rfl:    mometasone (ELOCON) 0.1 % cream, Apply topically daily., Disp: 45 g, Rfl: 0   mometasone (ELOCON) 0.1 % lotion, APPLY TO  THE AFFECTED AREAS OF SCALP THREE NIGHTS PER WEEK, Disp: 60 mL, Rfl: 0   naproxen (EC NAPROSYN) 500 MG EC tablet, Take 500 mg by mouth 2 (two) times daily with a meal., Disp: , Rfl:    omega-3 acid ethyl esters (LOVAZA) 1 g capsule, Take 2 capsules (2 g total) by mouth 2 (two) times daily., Disp: 480 capsule, Rfl: 3   omeprazole (PRILOSEC) 20 MG capsule, Take 1 capsule by mouth once daily, Disp: 90 capsule, Rfl: 0   rosuvastatin (CRESTOR) 10 MG tablet, Take 1 tablet (10 mg total) by mouth at bedtime., Disp: 90 tablet, Rfl: 1   Semaglutide,0.25 or 0.5MG /DOS, (OZEMPIC, 0.25 OR 0.5 MG/DOSE,) 2 MG/1.5ML SOPN, Inject into the skin., Disp: , Rfl:    tadalafil (CIALIS) 20 MG tablet, Take 0.5-1 tablets (10-20 mg total) by mouth every other day as needed for erectile dysfunction., Disp: 30 tablet, Rfl: 0   telmisartan-hydrochlorothiazide (MICARDIS HCT) 80-12.5 MG tablet, Take 1 tablet by mouth once daily, Disp: 90 tablet, Rfl: 0   timolol (TIMOPTIC) 0.5 % ophthalmic solution, INSTILL 1 DROP INTO LEFT EYE TWICE DAILY, Disp: , Rfl:   Allergies  Allergen Reactions   Demerol [Meperidine] Other (See Comments) and Anaphylaxis    MAKES BLOOD PRESSURE BOTTOM OUT hypotension   Dilaudid  [Hydromorphone Hcl]    Hydromorphone     Other reaction(s): Unknown MAKES BLOOD PRESSURE BOTTOM OUT   Meperidine Hcl    Actos [Pioglitazone] Other (See Comments) and Hives   Dapagliflozin Rash   Penicillins Rash    I personally reviewed active problem list, medication list, allergies, family history, social history, health maintenance with the patient/caregiver today.   ROS  Ten systems reviewed and is negative except as mentioned in HPI   Objective  Vitals:   10/05/22 1528  BP: 112/68  Pulse: 85  Resp: 18  Temp: 97.8 F (36.6 C)  TempSrc: Oral  SpO2: 96%  Weight: 201 lb (91.2 kg)  Height: 6' 3.5" (1.918 m)    Body mass index is 24.79 kg/m.  Physical Exam  Constitutional: Patient appears well-developed and well-nourished.  No distress.  HEENT: head atraumatic, normocephalic, pupils equal and reactive to light, neck supple Cardiovascular: Normal rate, regular rhythm and normal heart sounds.  No murmur heard. No BLE edema. Pulmonary/Chest: Effort normal and breath sounds normal. No respiratory distress. Abdominal: Soft.  There is no tenderness. Muscular skeletal: using a cane to help with ambulation , mild effusion of right knee, crepitus with extension of both knees  Psychiatric: Patient has a normal mood and affect. behavior is normal. Judgment and thought content normal.    PHQ2/9:    10/05/2022    3:31 PM 07/24/2022    8:11 AM 06/03/2022    1:16 PM 01/28/2022   11:03 AM 11/03/2021    1:49 PM  Depression screen PHQ 2/9  Decreased Interest 0 0 0 0 0  Down, Depressed, Hopeless 0 0 0 0 0  PHQ - 2 Score 0 0 0 0 0  Altered sleeping 0 0 0  0  Tired, decreased energy 0 0 0  0  Change in appetite 0 0 0  0  Feeling bad or failure about yourself  0 0 0  0  Trouble concentrating 0 0 0  0  Moving slowly or fidgety/restless 0 0 0  0  Suicidal thoughts 0 0 0  0  PHQ-9 Score 0 0 0  0  Difficult doing work/chores  Not difficult at all  phq 9 is negative   Fall  Risk:    10/05/2022    3:30 PM 07/24/2022    8:11 AM 06/03/2022    1:15 PM 03/05/2022    2:05 PM 01/28/2022   10:57 AM  Fall Risk   Falls in the past year? 0 0 0 0 0  Number falls in past yr: 0 0  0 0  Injury with Fall? 0 0  0 0  Risk for fall due to : Impaired balance/gait No Fall Risks No Fall Risks No Fall Risks No Fall Risks  Follow up  Falls prevention discussed;Education provided;Falls evaluation completed Falls prevention discussed;Education provided;Falls evaluation completed Falls evaluation completed Falls prevention discussed     Assessment & Plan  1. Acute pain of right knee  Continue current regiment and follow up with Ortho   2. Primary osteoarthritis of right knee  Continue current regiment and follow up with Ortho as scheduled  , he will call back if he decides to switch to celebrex 100 mg BID or meloxicam 7.5 mg bid  3. Dyslipidemia associated with type 2 diabetes mellitus  Glucose is improving and getting back to baseline now

## 2022-10-05 ENCOUNTER — Other Ambulatory Visit: Payer: Self-pay | Admitting: Family Medicine

## 2022-10-05 ENCOUNTER — Ambulatory Visit (INDEPENDENT_AMBULATORY_CARE_PROVIDER_SITE_OTHER): Payer: Medicare Other | Admitting: Family Medicine

## 2022-10-05 ENCOUNTER — Encounter: Payer: Self-pay | Admitting: Family Medicine

## 2022-10-05 VITALS — BP 112/68 | HR 85 | Temp 97.8°F | Resp 18 | Ht 75.5 in | Wt 201.0 lb

## 2022-10-05 DIAGNOSIS — M25561 Pain in right knee: Secondary | ICD-10-CM

## 2022-10-05 DIAGNOSIS — M1711 Unilateral primary osteoarthritis, right knee: Secondary | ICD-10-CM | POA: Diagnosis not present

## 2022-10-05 DIAGNOSIS — E1169 Type 2 diabetes mellitus with other specified complication: Secondary | ICD-10-CM

## 2022-10-05 DIAGNOSIS — E785 Hyperlipidemia, unspecified: Secondary | ICD-10-CM

## 2022-10-07 ENCOUNTER — Other Ambulatory Visit: Payer: Self-pay | Admitting: Family Medicine

## 2022-10-07 DIAGNOSIS — N400 Enlarged prostate without lower urinary tract symptoms: Secondary | ICD-10-CM

## 2022-10-08 DIAGNOSIS — Z794 Long term (current) use of insulin: Secondary | ICD-10-CM | POA: Diagnosis not present

## 2022-10-08 DIAGNOSIS — E1159 Type 2 diabetes mellitus with other circulatory complications: Secondary | ICD-10-CM | POA: Diagnosis not present

## 2022-10-08 DIAGNOSIS — E1142 Type 2 diabetes mellitus with diabetic polyneuropathy: Secondary | ICD-10-CM | POA: Diagnosis not present

## 2022-10-08 DIAGNOSIS — E1169 Type 2 diabetes mellitus with other specified complication: Secondary | ICD-10-CM | POA: Diagnosis not present

## 2022-10-08 DIAGNOSIS — I152 Hypertension secondary to endocrine disorders: Secondary | ICD-10-CM | POA: Diagnosis not present

## 2022-10-08 DIAGNOSIS — E785 Hyperlipidemia, unspecified: Secondary | ICD-10-CM | POA: Diagnosis not present

## 2022-10-08 LAB — HEMOGLOBIN A1C: Hemoglobin A1C: 9

## 2022-10-14 ENCOUNTER — Ambulatory Visit (INDEPENDENT_AMBULATORY_CARE_PROVIDER_SITE_OTHER): Payer: Medicare Other | Admitting: Dermatology

## 2022-10-14 ENCOUNTER — Encounter: Payer: Self-pay | Admitting: Dermatology

## 2022-10-14 VITALS — BP 118/67

## 2022-10-14 DIAGNOSIS — Z85828 Personal history of other malignant neoplasm of skin: Secondary | ICD-10-CM | POA: Diagnosis not present

## 2022-10-14 DIAGNOSIS — L578 Other skin changes due to chronic exposure to nonionizing radiation: Secondary | ICD-10-CM | POA: Diagnosis not present

## 2022-10-14 DIAGNOSIS — L729 Follicular cyst of the skin and subcutaneous tissue, unspecified: Secondary | ICD-10-CM | POA: Diagnosis not present

## 2022-10-14 DIAGNOSIS — Z872 Personal history of diseases of the skin and subcutaneous tissue: Secondary | ICD-10-CM

## 2022-10-14 NOTE — Patient Instructions (Signed)
Due to recent changes in healthcare laws, you may see results of your pathology and/or laboratory studies on MyChart before the doctors have had a chance to review them. We understand that in some cases there may be results that are confusing or concerning to you. Please understand that not all results are received at the same time and often the doctors may need to interpret multiple results in order to provide you with the best plan of care or course of treatment. Therefore, we ask that you please give us 2 business days to thoroughly review all your results before contacting the office for clarification. Should we see a critical lab result, you will be contacted sooner.   If You Need Anything After Your Visit  If you have any questions or concerns for your doctor, please call our main line at [phone] and press option 4 to reach your doctor's medical assistant. If no one answers, please leave a voicemail as directed and we will return your call as soon as possible. Messages left after 4 pm will be answered the following business day.   You may also send us a message via MyChart. We typically respond to MyChart messages within 1-2 business days.  For prescription refills, please ask your pharmacy to contact our office. Our fax number is [phone].  If you have an urgent issue when the clinic is closed that cannot wait until the next business day, you can page your doctor at the number below.    Please note that while we do our best to be available for urgent issues outside of office hours, we are not available 24/7.   If you have an urgent issue and are unable to reach us, you may choose to seek medical care at your doctor's office, retail clinic, urgent care center, or emergency room.  If you have a medical emergency, please immediately call 911 or go to the emergency department.  Pager Numbers  - Dr. Kowalski: [phone]  - Dr. Moye: [phone]  - Dr. Stewart:  [phone]  In the event of inclement weather, please call our main line at [phone] for an update on the status of any delays or closures.  Dermatology Medication Tips: Please keep the boxes that topical medications come in in order to help keep track of the instructions about where and how to use these. Pharmacies typically print the medication instructions only on the boxes and not directly on the medication tubes.   If your medication is too expensive, please contact our office at [phone] option 4 or send us a message through MyChart.   We are unable to tell what your co-pay for medications will be in advance as this is different depending on your insurance coverage. However, we may be able to find a substitute medication at lower cost or fill out paperwork to get insurance to cover a needed medication.   If a prior authorization is required to get your medication covered by your insurance company, please allow us 1-2 business days to complete this process.  Drug prices often vary depending on where the prescription is filled and some pharmacies may offer cheaper prices.  The website www.goodrx.com contains coupons for medications through different pharmacies. The prices here do not account for what the cost may be with help from insurance (it may be cheaper with your insurance), but the website can give you the price if you did not use any insurance.  - You can print the associated coupon and take it with   your prescription to the pharmacy.  - You may also stop by our office during regular business hours and pick up a GoodRx coupon card.  - If you need your prescription sent electronically to a different pharmacy, notify our office through [facility] MyChart or by phone at [phone] option 4.     Si Usted Necesita Algo Despus de Su Visita  Tambin puede enviarnos un mensaje a travs de MyChart. Por lo general respondemos a los mensajes de MyChart en el transcurso de 1 a 2  das hbiles.  Para renovar recetas, por favor pida a su farmacia que se ponga en contacto con nuestra oficina. Nuestro nmero de fax es el [phone].  Si tiene un asunto urgente cuando la clnica est cerrada y que no puede esperar hasta el siguiente da hbil, puede llamar/localizar a su doctor(a) al nmero que aparece a continuacin.   Por favor, tenga en cuenta que aunque hacemos todo lo posible para estar disponibles para asuntos urgentes fuera del horario de oficina, no estamos disponibles las 24 horas del da, los 7 das de la semana.   Si tiene un problema urgente y no puede comunicarse con nosotros, puede optar por buscar atencin mdica  en el consultorio de su doctor(a), en una clnica privada, en un centro de atencin urgente o en una sala de emergencias.  Si tiene una emergencia mdica, por favor llame inmediatamente al 911 o vaya a la sala de emergencias.  Nmeros de bper  - Dr. Kowalski: [phone]  - Dra. Moye: [phone]  - Dra. Stewart: [phone]  En caso de inclemencias del tiempo, por favor llame a nuestra lnea principal al [phone] para una actualizacin sobre el estado de cualquier retraso o cierre.  Consejos para la medicacin en dermatologa: Por favor, guarde las cajas en las que vienen los medicamentos de uso tpico para ayudarle a seguir las instrucciones sobre dnde y cmo usarlos. Las farmacias generalmente imprimen las instrucciones del medicamento slo en las cajas y no directamente en los tubos del medicamento.   Si su medicamento es muy caro, por favor, pngase en contacto con nuestra oficina llamando al [phone] y presione la opcin 4 o envenos un mensaje a travs de MyChart.   No podemos decirle cul ser su copago por los medicamentos por adelantado ya que esto es diferente dependiendo de la cobertura de su seguro. Sin embargo, es posible que podamos encontrar un medicamento sustituto a menor costo o llenar un formulario para que el  seguro cubra el medicamento que se considera necesario.   Si se requiere una autorizacin previa para que su compaa de seguros cubra su medicamento, por favor permtanos de 1 a 2 das hbiles para completar este proceso.  Los precios de los medicamentos varan con frecuencia dependiendo del lugar de dnde se surte la receta y alguna farmacias pueden ofrecer precios ms baratos.  El sitio web www.goodrx.com tiene cupones para medicamentos de diferentes farmacias. Los precios aqu no tienen en cuenta lo que podra costar con la ayuda del seguro (puede ser ms barato con su seguro), pero el sitio web puede darle el precio si no utiliz ningn seguro.  - Puede imprimir el cupn correspondiente y llevarlo con su receta a la farmacia.  - Tambin puede pasar por nuestra oficina durante el horario de atencin regular y recoger una tarjeta de cupones de GoodRx.  - Si necesita que su receta se enve electrnicamente a una farmacia diferente, informe a nuestra oficina a travs de MyChart de [facility]   o por telfono llamando al [phone] y presione la opcin 4.  

## 2022-10-14 NOTE — Progress Notes (Signed)
   Follow-Up Visit   Subjective  Jeremy [name] is a 87 y.o. male who presents for the following: History of BCC treated with Mohs 01/2022 by Dr Elvin [facility] at [facility] The patient has spots, moles and lesions to be evaluated, some may be new or changing and the patient may have concern these could be cancer.  The following portions of the chart were reviewed this encounter and updated as appropriate: medications, allergies, medical history  Review of Systems:  No other skin or systemic complaints except as noted in HPI or Assessment and Plan.  Objective  Well appearing patient in no apparent distress; mood and affect are within normal limits. A focused examination was performed of the following areas: Relevant exam findings are noted in the Assessment and Plan.   Assessment & Plan   HISTORY OF BASAL CELL CARCINOMA OF THE SKIN - No evidence of recurrence today - Recommend regular full body skin exams - Recommend daily broad spectrum sunscreen SPF 30+ to sun-exposed areas, reapply every 2 hours as needed.  - Call if any new or changing lesions are noted between office visits  HISTORY OF PRECANCEROUS ACTINIC KERATOSIS - site(s) of PreCancerous Actinic Keratosis clear today. - these may recur and new lesions may form requiring treatment to prevent transformation into skin cancer - observe for new or changing spots and contact [facility] for appointment if occur - photoprotection with sun protective clothing; sunglasses and broad spectrum sunscreen with SPF of at least 30 + and frequent self skin exams recommended - yearly exams by a dermatologist recommended for persons with history of PreCancerous Actinic Keratoses  ACTINIC DAMAGE - chronic, secondary to cumulative UV radiation exposure/sun exposure over time - diffuse scaly erythematous macules with underlying dyspigmentation - Recommend daily broad spectrum sunscreen SPF 30+ to sun-exposed areas, reapply every 2  hours as needed.  - Recommend staying in the shade or wearing long sleeves, sun glasses (UVA+UVB protection) and wide brim hats (4-inch brim around the entire circumference of the hat). - Call for new or changing lesions.  EPIDERMAL INCLUSION CYST Exam: 0.6 cm Subcutaneous nodule at right post crown  Benign-appearing. Exam most consistent with an epidermal inclusion cyst. Discussed that a cyst is a benign growth that can grow over time and sometimes get irritated or inflamed. Recommend observation if it is not bothersome. Discussed option of surgical excision to remove it if it is growing, symptomatic, or other changes noted. Please call for new or changing lesions so they can be evaluated.  Return in about 1 year (around 10/14/2023).  I, Joanie Coddington, CMA, am acting as scribe for Armida Sans, MD .  Documentation: I have reviewed the above documentation for accuracy and completeness, and I agree with the above.  Armida Sans, MD

## 2022-10-20 ENCOUNTER — Encounter: Payer: Self-pay | Admitting: Dermatology

## 2022-10-21 ENCOUNTER — Telehealth: Payer: Self-pay

## 2022-10-21 DIAGNOSIS — E119 Type 2 diabetes mellitus without complications: Secondary | ICD-10-CM

## 2022-10-21 DIAGNOSIS — I152 Hypertension secondary to endocrine disorders: Secondary | ICD-10-CM

## 2022-10-21 NOTE — Telephone Encounter (Signed)
PharmD reviewed patient chart to assess eligibility for [facility] services. Patient was determined to be a good candidate for the program given the complexity of the medication regimen and overall risk for hospitalization and/or high healthcare utilization.   Referral entered in order to outreach patient and offer appointment with PharmD. Referral cosigned to PCP.  

## 2022-10-26 DIAGNOSIS — M3501 Sicca syndrome with keratoconjunctivitis: Secondary | ICD-10-CM | POA: Diagnosis not present

## 2022-10-26 DIAGNOSIS — H401122 Primary open-angle glaucoma, left eye, moderate stage: Secondary | ICD-10-CM | POA: Diagnosis not present

## 2022-10-26 DIAGNOSIS — E11319 Type 2 diabetes mellitus with unspecified diabetic retinopathy without macular edema: Secondary | ICD-10-CM | POA: Diagnosis not present

## 2022-10-26 DIAGNOSIS — H2702 Aphakia, left eye: Secondary | ICD-10-CM | POA: Diagnosis not present

## 2022-10-29 LAB — HM DIABETES EYE EXAM

## 2022-10-30 ENCOUNTER — Other Ambulatory Visit: Payer: Self-pay | Admitting: Dermatology

## 2022-10-30 DIAGNOSIS — L219 Seborrheic dermatitis, unspecified: Secondary | ICD-10-CM

## 2022-10-31 ENCOUNTER — Other Ambulatory Visit: Payer: Self-pay | Admitting: Family Medicine

## 2022-10-31 DIAGNOSIS — E1129 Type 2 diabetes mellitus with other diabetic kidney complication: Secondary | ICD-10-CM

## 2022-10-31 DIAGNOSIS — I152 Hypertension secondary to endocrine disorders: Secondary | ICD-10-CM

## 2022-11-03 ENCOUNTER — Other Ambulatory Visit
Admission: RE | Admit: 2022-11-03 | Discharge: 2022-11-03 | Disposition: A | Discharge status: Home / Self Care | Payer: Medicare Other | Source: Ambulatory Visit | Attending: Rheumatology | Admitting: Rheumatology

## 2022-11-03 DIAGNOSIS — M25461 Effusion, right knee: Secondary | ICD-10-CM | POA: Diagnosis not present

## 2022-11-03 DIAGNOSIS — R768 Other specified abnormal immunological findings in serum: Secondary | ICD-10-CM | POA: Diagnosis not present

## 2022-11-03 LAB — SYNOVIAL CELL COUNT + DIFF, W/ CRYSTALS
Crystals, Fluid: NONE SEEN
Eosinophils-Synovial: 0 %
Lymphocytes-Synovial Fld: 43 %
Monocyte-Macrophage-Synovial Fluid: 18 %
Neutrophil, Synovial: 39 %
WBC, Synovial: 166 /mm3 (ref 0–200)

## 2022-11-05 ENCOUNTER — Other Ambulatory Visit: Payer: Self-pay | Admitting: Rheumatology

## 2022-11-05 DIAGNOSIS — M2391 Unspecified internal derangement of right knee: Secondary | ICD-10-CM

## 2022-11-05 DIAGNOSIS — M25461 Effusion, right knee: Secondary | ICD-10-CM

## 2022-11-11 DIAGNOSIS — E1142 Type 2 diabetes mellitus with diabetic polyneuropathy: Secondary | ICD-10-CM | POA: Diagnosis not present

## 2022-11-11 DIAGNOSIS — B351 Tinea unguium: Secondary | ICD-10-CM | POA: Diagnosis not present

## 2022-11-19 ENCOUNTER — Other Ambulatory Visit: Payer: Self-pay | Admitting: Family Medicine

## 2022-11-26 ENCOUNTER — Ambulatory Visit
Admission: RE | Admit: 2022-11-26 | Discharge: 2022-11-26 | Disposition: A | Discharge status: Home / Self Care | Payer: Medicare Other | Source: Ambulatory Visit | Attending: Rheumatology | Admitting: Rheumatology

## 2022-11-26 DIAGNOSIS — M25461 Effusion, right knee: Secondary | ICD-10-CM

## 2022-11-26 DIAGNOSIS — M25561 Pain in right knee: Secondary | ICD-10-CM | POA: Diagnosis not present

## 2022-11-26 DIAGNOSIS — R936 Abnormal findings on diagnostic imaging of limbs: Secondary | ICD-10-CM | POA: Diagnosis not present

## 2022-11-26 DIAGNOSIS — M2391 Unspecified internal derangement of right knee: Secondary | ICD-10-CM

## 2022-11-26 DIAGNOSIS — M7989 Other specified soft tissue disorders: Secondary | ICD-10-CM | POA: Diagnosis not present

## 2022-11-28 ENCOUNTER — Other Ambulatory Visit: Payer: Self-pay | Admitting: Dermatology

## 2022-11-28 DIAGNOSIS — L219 Seborrheic dermatitis, unspecified: Secondary | ICD-10-CM

## 2022-12-01 DIAGNOSIS — M1711 Unilateral primary osteoarthritis, right knee: Secondary | ICD-10-CM | POA: Diagnosis not present

## 2022-12-03 NOTE — Progress Notes (Signed)
Name: Jeremy [name]   MRN: [id number]    DOB: [birth year]   Date:12/07/2022       Progress Note  Subjective  Chief Complaint  Follow Up  HPI  DMII: he was diagnosed in the 90's during a hospital stay for small bowel obstruction. He was on oral medications for a long time, but has been on insulin for the past few years. Currently seeing Endocrinologist Dr. Gershon Crane at [facility], last A1C was 9 % back in April 2024 - he just had a steroid injections before lab was done,  he is currently [place] 30 units,  glipizide 10 mg , Metformin 100 mg BID, Jardiance , he is still alternating Bydureon and Ozempic ( endo is aware)  He denies polyphagia, polyuria or polydipsia. He is compliant and is up to date with eye exam - that is now showing some diabetic retinopathy.  He also has a history of microalbuminuria and is on ARB, dyslipidemia and is on statin therapy   Thrombocytopenia: last level was 138, denies easy bleeding  Malnutrition: he has lost 43.5 lbs in t2023  He states he is happy with weight loss. He states he started to lose weight with Jardiance. Discussed muscle mass and importance of protein intake. Weight is trending up a little    HTN: BP has been low, he had some episodes of dizziness, we will decrease dose of micardis HCTZ from 80/25 mg to 80/12.5 mg , we will change to 40/12.5 for his next refill    Atherosclerosis of aorta:  found on CT scan done 07/2018, no side effects of Rosuvastatin last LDL at goal   GERD/Chronic superficial gastritis : had EGD done recently by Dr. Birdie Sons and no esophageal varices seen on EGD this time around, he has gastritis but controlled with PPI, discussed high risk use of PPI but he states not able to come off medication,. Advised him to stop taking Naprosyn, he takes it daily , we will switch to Tramadol discussed possible side effects, controlled medications   Seborrhea: under the care of Dr. Gwen Pounds, doing well on medication  Unchanged   Basal  cell carcinoma face: under the care of Dr. Gwen Pounds and waiting he had Moh's surgery on face and is doing better   Right knee pain : seen by Rheumatologist had a MRI done and is now going to see Ortho  MRI below from 11/26/2022 1. Suspected subacute fracture of the lateral tibial plateau with mild depression of the weight-bearing articular surface peripherally. Recommend plain film correlation. 2. Underlying mild tricompartmental degenerative changes with moderate-sized joint effusion and possible small dependent loose bodies or debris. 3. Probable degenerative tearing of the body and anterior horn of the lateral meniscus. The medial meniscus, cruciate and collateral ligaments are intact.   Patient Active Problem List   Diagnosis Date Noted   Thrombocytopenia (HCC) 06/03/2022   B12 deficiency 06/03/2022   Basal cell carcinoma (BCC) of skin of face 11/03/2021   Dyslipidemia associated with type 2 diabetes mellitus (HCC) 11/03/2021   Impingement syndrome of shoulder region 11/16/2018   Atherosclerosis of aorta (HCC) 07/21/2018   History of diverticulitis of colon 11/15/2017   Hyperlipidemia due to type 2 diabetes mellitus (HCC) 03/14/2017   Bilateral lower extremity edema 03/27/2016   Vitamin D deficiency 12/05/2015   Hx of transient ischemic attack (TIA) 09/17/2015   Diabetes mellitus type 2 without retinopathy (HCC) 05/31/2015   Hyperlipidemia 05/30/2015   Type 2 diabetes mellitus with renal manifestations (HCC) 01/08/2015  Incisional hernia, without obstruction or gangrene 03/23/2013   Enlarged prostate with lower urinary tract symptoms (LUTS) 11/03/2012   Myopic degeneration, bilateral 08/12/2012   Nonexudative age-related macular degeneration 07/15/2012   Anisometropia 04/08/2012   Pseudoaphakia 04/08/2012   History of retinal detachment 04/08/2012   Presence of intraocular lens 04/08/2012   Secondary open-angle glaucoma of left eye, severe stage 01/13/2012   Benign  essential HTN 02/03/2007   Hypertension associated with diabetes (HCC) 02/03/2007    Past Surgical History:  Procedure Laterality Date   APPENDECTOMY     BLADDER DIVERTICULECTOMY  2013   CARPAL TUNNEL RELEASE  01/2022   revision right side   COLON SURGERY Left 1980's   COLONOSCOPY W/ POLYPECTOMY  1990's   Dr Okey Dupre   COLONOSCOPY WITH PROPOFOL N/A 12/21/2014   Procedure: COLONOSCOPY WITH PROPOFOL;  Surgeon: Christena Deem, MD;  Location: [facility] ENDOSCOPY;  Service: Endoscopy;  Laterality: N/A;   COLONOSCOPY WITH PROPOFOL N/A 08/16/2017   Procedure: COLONOSCOPY WITH PROPOFOL;  Surgeon: Christena Deem, MD;  Location: [facility] ENDOSCOPY;  Service: Endoscopy;  Laterality: N/A;   COLONOSCOPY WITH PROPOFOL N/A 01/31/2021   Procedure: COLONOSCOPY WITH PROPOFOL;  Surgeon: Earline Mayotte, MD;  Location: [facility] ENDOSCOPY;  Service: Endoscopy;  Laterality: N/A;   ESOPHAGOGASTRODUODENOSCOPY (EGD) WITH PROPOFOL N/A 02/15/2015   Procedure: ESOPHAGOGASTRODUODENOSCOPY (EGD) WITH PROPOFOL;  Surgeon: Christena Deem, MD;  Location: [facility] ENDOSCOPY;  Service: Endoscopy;  Laterality: N/A;   ESOPHAGOGASTRODUODENOSCOPY (EGD) WITH PROPOFOL N/A 06/10/2017   Procedure: ESOPHAGOGASTRODUODENOSCOPY (EGD) WITH PROPOFOL;  Surgeon: Christena Deem, MD;  Location: [facility] ENDOSCOPY;  Service: Endoscopy;  Laterality: N/A;   ESOPHAGOGASTRODUODENOSCOPY (EGD) WITH PROPOFOL N/A 08/16/2017   Procedure: ESOPHAGOGASTRODUODENOSCOPY (EGD) WITH PROPOFOL;  Surgeon: Christena Deem, MD;  Location: [facility] ENDOSCOPY;  Service: Endoscopy;  Laterality: N/A;   ESOPHAGOGASTRODUODENOSCOPY (EGD) WITH PROPOFOL N/A 01/31/2021   Procedure: ESOPHAGOGASTRODUODENOSCOPY (EGD) WITH PROPOFOL;  Surgeon: Earline Mayotte, MD;  Location: [facility] ENDOSCOPY;  Service: Endoscopy;  Laterality: N/A;   EYE SURGERY     GLAUCOMA SURGERY Left 1990's   [facility]   HEMORROIDECTOMY  [phone]   [facility]   HERNIA REPAIR     [facility], Dr Carol Ada HERNIA REPAIR Bilateral [phone]   LARYNX SURGERY  1990   3 times   MOHS SURGERY  01/22/2022   Dr. Verdell Face - [facility]   RETINAL DETACHMENT SURGERY Right    TONSILLECTOMY     turp  2013   URETER SURGERY  2013   Dr Achilles Dunk    Family History  Problem Relation Age of Onset   Diabetes Mother    Cataracts Mother    Metabolic syndrome Mother    Blindness Mother        r/t diabetes   Cataracts Father    Glaucoma Father        Retina-Detachment   Atrial fibrillation Father    Diverticulitis Sister    Glaucoma Brother    Diabetes Brother    Cancer Sister        Gallbladder   Cancer Daughter     Social History   Tobacco Use   Smoking status: Never   Smokeless tobacco: Never   Tobacco comments:    smoking cessation materials not required  Substance Use Topics   Alcohol use: No     Current Outpatient Medications:    bisoprolol (ZEBETA) 10 MG tablet, Take 1 tablet (10 mg total) by mouth daily., Disp: 90 tablet, Rfl: 1   brimonidine (  ALPHAGAN P) 0.1 % SOLN, Place 1 drop into the left eye 2 (two) times daily. , Disp: , Rfl:    desonide (DESOWEN) 0.05 % ointment, Apply to rash under eye QD-BID PRN, Disp: 15 g, Rfl: 1   dorzolamide (TRUSOPT) 2 % ophthalmic solution, Place 1 drop into the left eye 2 (two) times daily., Disp: , Rfl: 3   empagliflozin (JARDIANCE) 25 MG TABS tablet, Take by mouth daily., Disp: , Rfl:    Exenatide ER (BYDUREON BCISE) 2 MG/0.85ML AUIJ, Inject 2 mg into the skin once a week., Disp: , Rfl:    finasteride (PROSCAR) 5 MG tablet, Take 1 tablet by mouth once daily, Disp: 90 tablet, Rfl: 0   glipiZIDE (GLUCOTROL) 10 MG tablet, Take 10 mg by mouth daily., Disp: , Rfl:    hydrocortisone 2.5 % cream, APPLY TO THE AFFECTED AREA OF FACE AND EARS EVERY OTHER NIGHT ALTERNATING WITH KETOCONAZOLE CREAM, Disp: 28 g, Rfl: 0   insulin degludec (TRESIBA FLEXTOUCH) 200 UNIT/ML FlexTouch Pen, Inject 45 Units into the skin in the morning., Disp: , Rfl:    ketoconazole  (NIZORAL) 2 % cream, Apply to penis twice daily, Disp: 60 g, Rfl: 3   ketoconazole (NIZORAL) 2 % shampoo, Apply 1 Application topically 3 (three) times a week. Shampoo scalp 3 days a week let sit 5 minutes and rinse off, Disp: 120 mL, Rfl: 3   metFORMIN (GLUCOPHAGE) 1000 MG tablet, Take 1,000 mg by mouth 2 (two) times daily with a meal., Disp: , Rfl:    mometasone (ELOCON) 0.1 % cream, Apply topically daily., Disp: 45 g, Rfl: 0   mometasone (ELOCON) 0.1 % lotion, APPLY TO THE AFFECTED AREAS OF SCALP THREE NIGHTS PER WEEK, Disp: 60 mL, Rfl: 0   naproxen (EC NAPROSYN) 500 MG EC tablet, Take 500 mg by mouth 2 (two) times daily with a meal., Disp: , Rfl:    omega-3 acid ethyl esters (LOVAZA) 1 g capsule, Take 2 capsules (2 g total) by mouth 2 (two) times daily., Disp: 480 capsule, Rfl: 3   omeprazole (PRILOSEC) 20 MG capsule, Take 1 capsule by mouth once daily, Disp: 90 capsule, Rfl: 0   rosuvastatin (CRESTOR) 10 MG tablet, Take 1 tablet (10 mg total) by mouth at bedtime., Disp: 90 tablet, Rfl: 1   Semaglutide,0.25 or 0.5MG /DOS, (OZEMPIC, 0.25 OR 0.5 MG/DOSE,) 2 MG/1.5ML SOPN, Inject into the skin., Disp: , Rfl:    tadalafil (CIALIS) 20 MG tablet, Take 0.5-1 tablets (10-20 mg total) by mouth every other day as needed for erectile dysfunction., Disp: 30 tablet, Rfl: 0   telmisartan-hydrochlorothiazide (MICARDIS HCT) 80-12.5 MG tablet, Take 1 tablet by mouth once daily, Disp: 90 tablet, Rfl: 0   timolol (TIMOPTIC) 0.5 % ophthalmic solution, INSTILL 1 DROP INTO LEFT EYE TWICE DAILY, Disp: , Rfl:   Allergies  Allergen Reactions   Demerol [Meperidine] Other (See Comments) and Anaphylaxis    MAKES BLOOD PRESSURE BOTTOM OUT hypotension   Dilaudid [Hydromorphone Hcl]    Hydromorphone     Other reaction(s): Unknown MAKES BLOOD PRESSURE BOTTOM OUT   Meperidine Hcl    Actos [Pioglitazone] Other (See Comments) and Hives   Dapagliflozin Rash   Penicillins Rash    I personally reviewed active problem  list, medication list, allergies, family history, social history, health maintenance with the patient/caregiver today.   ROS  Constitutional: Negative for fever or weight change.  Respiratory: Negative for cough and shortness of breath.   Cardiovascular: Negative for chest pain or palpitations.  Gastrointestinal: Negative for abdominal pain, no bowel changes.  Musculoskeletal: positive  for gait problem or joint swelling.  Skin: Negative for rash.  Neurological: Negative for dizziness or headache.  No other specific complaints in a complete review of systems (except as listed in HPI above).   Objective  Vitals:   12/07/22 1326  BP: 120/68  Pulse: 85  Resp: 16  SpO2: 96%  Weight: 208 lb (94.3 kg)  Height: 6\' 3"  (1.905 m)    Body mass index is 26 kg/m.  Physical Exam  Constitutional: Patient appears well-developed and well-nourished. Obese  No distress.  HEENT: head atraumatic, normocephalic, pupils equal and reactive to light, neck supple Cardiovascular: Normal rate, regular rhythm and normal heart sounds.  No murmur heard. No BLE edema. Pulmonary/Chest: Effort normal and breath sounds normal. No respiratory distress. Abdominal: Soft.  There is no tenderness. Muscular skeletal: knee mild effusion, using a cane Psychiatric: Patient has a normal mood and affect. behavior is normal. Judgment and thought content normal.   Recent Results (from the past 2160 hour(s))  Hemoglobin A1c     Status: None   Collection Time: 10/08/22 12:00 AM  Result Value Ref Range   Hemoglobin A1C 9.0   HM DIABETES EYE EXAM     Status: Abnormal   Collection Time: 10/29/22 12:00 AM  Result Value Ref Range   HM Diabetic Eye Exam Retinopathy (A) No Retinopathy    Comment: AEC-no treatment required  Synovial cell count + diff, w/ crystals     Status: Abnormal   Collection Time: 11/03/22  4:15 PM  Result Value Ref Range   Color, Synovial AMBER (A) YELLOW   Appearance-Synovial TURBID (A) CLEAR    Crystals, Fluid NO CRYSTALS SEEN    WBC, Synovial 166 0 - 200 /cu mm   Neutrophil, Synovial 39 %   Lymphocytes-Synovial Fld 43 %   Monocyte-Macrophage-Synovial Fluid 18 %   Eosinophils-Synovial 0 %    Comment: Performed at [facility], [address] [place], [place] [address]    PHQ2/9:    12/07/2022    1:24 PM 10/05/2022    3:31 PM 07/24/2022    8:11 AM 06/03/2022    1:16 PM 01/28/2022   11:03 AM  Depression screen PHQ 2/9  Decreased Interest 0 0 0 0 0  Down, Depressed, Hopeless 0 0 0 0 0  PHQ - 2 Score 0 0 0 0 0  Altered sleeping 0 0 0 0   Tired, decreased energy 0 0 0 0   Change in appetite 0 0 0 0   Feeling bad or failure about yourself  0 0 0 0   Trouble concentrating 0 0 0 0   Moving slowly or fidgety/restless 0 0 0 0   Suicidal thoughts 0 0 0 0   PHQ-9 Score 0 0 0 0   Difficult doing work/chores   Not difficult at all      phq 9 is negative   Fall Risk:    12/07/2022    1:24 PM 10/05/2022    3:30 PM 07/24/2022    8:11 AM 06/03/2022    1:15 PM 03/05/2022    2:05 PM  Fall Risk   Falls in the past year? 0 0 0 0 0  Number falls in past yr: 0 0 0  0  Injury with Fall? 0 0 0  0  Risk for fall due to : Impaired balance/gait Impaired balance/gait No Fall Risks No Fall Risks No Fall Risks  Follow up Falls prevention discussed  Falls prevention discussed;Education provided;Falls evaluation completed Falls prevention discussed;Education provided;Falls evaluation completed Falls evaluation completed      Functional Status Survey: Is the patient deaf or have difficulty hearing?: No Does the patient have difficulty seeing, even when wearing glasses/contacts?: No Does the patient have difficulty concentrating, remembering, or making decisions?: No Does the patient have difficulty walking or climbing stairs?: Yes Does the patient have difficulty dressing or bathing?: No Does the patient have difficulty doing errands alone such as visiting a doctor's office or  shopping?: No    Assessment & Plan  1. Hypertension associated with diabetes (HCC)  - telmisartan-hydrochlorothiazide (MICARDIS HCT) 40-12.5 MG tablet; Take 1 tablet by mouth daily.  Dispense: 30 tablet; Refill: 0  2. Mixed hyperlipidemia  On statin therapy   3. Dyslipidemia associated with type 2 diabetes mellitus (HCC)  - rosuvastatin (CRESTOR) 10 MG tablet; Take 1 tablet (10 mg total) by mouth at bedtime.  Dispense: 90 tablet; Refill: 1  4. Atherosclerosis of aorta (HCC)  On statin therapy   5. Type 2 diabetes mellitus with microalbuminuria, with long-term current use of insulin (HCC)  Keep follow up with Dr. Gershon Crane   6. Senile purpura (HCC)  Reassurance given   7. Mild protein-calorie malnutrition (HCC)  Stable now   8. B12 deficiency  Advised to cut down on supplementation   9. Chronic superficial gastritis without bleeding  - omeprazole (PRILOSEC) 20 MG capsule; Take 1 capsule (20 mg total) by mouth daily.  Dispense: 90 capsule; Refill: 0  10. Primary osteoarthritis of right knee  - traMADol (ULTRAM) 50 MG tablet; Take 1 tablet (50 mg total) by mouth every morning.  Dispense: 30 tablet; Refill: 0

## 2022-12-04 ENCOUNTER — Other Ambulatory Visit: Payer: Self-pay | Admitting: Family Medicine

## 2022-12-04 DIAGNOSIS — N400 Enlarged prostate without lower urinary tract symptoms: Secondary | ICD-10-CM

## 2022-12-04 DIAGNOSIS — N529 Male erectile dysfunction, unspecified: Secondary | ICD-10-CM

## 2022-12-05 ENCOUNTER — Other Ambulatory Visit: Payer: Self-pay | Admitting: Dermatology

## 2022-12-05 DIAGNOSIS — L219 Seborrheic dermatitis, unspecified: Secondary | ICD-10-CM

## 2022-12-07 ENCOUNTER — Ambulatory Visit (INDEPENDENT_AMBULATORY_CARE_PROVIDER_SITE_OTHER): Payer: Medicare Other | Admitting: Family Medicine

## 2022-12-07 ENCOUNTER — Encounter: Payer: Self-pay | Admitting: Family Medicine

## 2022-12-07 VITALS — BP 120/68 | HR 85 | Resp 16 | Ht 75.0 in | Wt 208.0 lb

## 2022-12-07 DIAGNOSIS — K2930 Chronic superficial gastritis without bleeding: Secondary | ICD-10-CM

## 2022-12-07 DIAGNOSIS — E1169 Type 2 diabetes mellitus with other specified complication: Secondary | ICD-10-CM

## 2022-12-07 DIAGNOSIS — I700 Atherosclerosis of aorta: Secondary | ICD-10-CM | POA: Diagnosis not present

## 2022-12-07 DIAGNOSIS — E538 Deficiency of other specified B group vitamins: Secondary | ICD-10-CM

## 2022-12-07 DIAGNOSIS — D692 Other nonthrombocytopenic purpura: Secondary | ICD-10-CM

## 2022-12-07 DIAGNOSIS — E1159 Type 2 diabetes mellitus with other circulatory complications: Secondary | ICD-10-CM | POA: Diagnosis not present

## 2022-12-07 DIAGNOSIS — E782 Mixed hyperlipidemia: Secondary | ICD-10-CM

## 2022-12-07 DIAGNOSIS — E1129 Type 2 diabetes mellitus with other diabetic kidney complication: Secondary | ICD-10-CM

## 2022-12-07 DIAGNOSIS — M1711 Unilateral primary osteoarthritis, right knee: Secondary | ICD-10-CM | POA: Diagnosis not present

## 2022-12-07 DIAGNOSIS — R809 Proteinuria, unspecified: Secondary | ICD-10-CM | POA: Diagnosis not present

## 2022-12-07 DIAGNOSIS — Z794 Long term (current) use of insulin: Secondary | ICD-10-CM | POA: Diagnosis not present

## 2022-12-07 DIAGNOSIS — E785 Hyperlipidemia, unspecified: Secondary | ICD-10-CM | POA: Diagnosis not present

## 2022-12-07 DIAGNOSIS — E441 Mild protein-calorie malnutrition: Secondary | ICD-10-CM

## 2022-12-07 DIAGNOSIS — I152 Hypertension secondary to endocrine disorders: Secondary | ICD-10-CM | POA: Diagnosis not present

## 2022-12-07 MED ORDER — ROSUVASTATIN CALCIUM 10 MG PO TABS: 10.0000 mg | ORAL_TABLET | Freq: Every day | ORAL | 1 refills | Status: DC

## 2022-12-07 MED ORDER — OMEPRAZOLE 20 MG PO CPDR: 20.0000 mg | DELAYED_RELEASE_CAPSULE | Freq: Every day | ORAL | 0 refills | Status: DC

## 2022-12-07 MED ORDER — TRAMADOL HCL 50 MG PO TABS: 50.0000 mg | ORAL_TABLET | ORAL | 0 refills | Status: DC

## 2022-12-07 MED ORDER — TELMISARTAN-HCTZ 40-12.5 MG PO TABS: 1.0000 | ORAL_TABLET | Freq: Every day | ORAL | 0 refills | Status: DC

## 2022-12-08 DIAGNOSIS — E785 Hyperlipidemia, unspecified: Secondary | ICD-10-CM | POA: Diagnosis not present

## 2022-12-08 DIAGNOSIS — E1159 Type 2 diabetes mellitus with other circulatory complications: Secondary | ICD-10-CM | POA: Diagnosis not present

## 2022-12-08 DIAGNOSIS — R079 Chest pain, unspecified: Secondary | ICD-10-CM | POA: Diagnosis not present

## 2022-12-08 DIAGNOSIS — R002 Palpitations: Secondary | ICD-10-CM | POA: Diagnosis not present

## 2022-12-08 DIAGNOSIS — E1169 Type 2 diabetes mellitus with other specified complication: Secondary | ICD-10-CM | POA: Diagnosis not present

## 2022-12-08 DIAGNOSIS — R0602 Shortness of breath: Secondary | ICD-10-CM | POA: Diagnosis not present

## 2022-12-08 DIAGNOSIS — I152 Hypertension secondary to endocrine disorders: Secondary | ICD-10-CM | POA: Diagnosis not present

## 2022-12-08 DIAGNOSIS — Z794 Long term (current) use of insulin: Secondary | ICD-10-CM | POA: Diagnosis not present

## 2022-12-08 DIAGNOSIS — E1142 Type 2 diabetes mellitus with diabetic polyneuropathy: Secondary | ICD-10-CM | POA: Diagnosis not present

## 2022-12-08 DIAGNOSIS — I700 Atherosclerosis of aorta: Secondary | ICD-10-CM | POA: Diagnosis not present

## 2022-12-15 ENCOUNTER — Other Ambulatory Visit: Payer: Self-pay | Admitting: Family Medicine

## 2022-12-15 ENCOUNTER — Telehealth: Payer: Self-pay | Admitting: Family Medicine

## 2022-12-15 ENCOUNTER — Ambulatory Visit: Payer: Self-pay | Admitting: *Deleted

## 2022-12-15 DIAGNOSIS — M1711 Unilateral primary osteoarthritis, right knee: Secondary | ICD-10-CM

## 2022-12-15 NOTE — Telephone Encounter (Signed)
Pt is calling in because he needs his medication traMADol (ULTRAM) 50 MG tablet moved up to 100 MG because the 50 isn't working. Pt says he spoke with his provider regarding this and was under the impression the medication would be changed as well as a full supply given. Pt says he only received 7 days worth of medication on his last refill. Please advise.

## 2022-12-15 NOTE — Telephone Encounter (Signed)
Message from Jeremy [name] sent at 12/15/2022 11:20 AM EDT  Summary: Says medication is ineffective.   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 The problem is his right knee.         Chief Complaint: asking for increase in dosage of Tramadol " 50 mg twice daily" Symptoms: persistent knee pain  Frequency: ongoing Disposition: [] ED /[] Urgent Care (no appt availability in office) / [] Appointment(In office/virtual)/ []  [facility]/ [] Home Care/ [] Refused Recommended Disposition /[] [place] [facility]/ [x]  Follow-up with PCP Additional Notes: pt stated he has only 1 pill left- pt stated that he would like this taken care of ASAP   Reason for Disposition  [1] Caller has URGENT medicine question about med that PCP or specialist prescribed AND [2] triager unable to answer question  Answer Assessment - Initial Assessment Questions 1. NAME of MEDICINE: "What medicine(s) are you calling about?"     Tramadol  2. QUESTION: "What is your question?" (e.g., double dose of medicine, side effect)     Can dosage be increased to 50 mg twice daily- this med is not as effective as the Naprosyn and was advised not to take Naprosyn  3. PRESCRIBER: "Who prescribed the medicine?" Reason: if prescribed by specialist, call should be referred to that group.     PCP 4. SYMPTOMS: "Do you have any symptoms?" If Yes, ask: "What symptoms are you having?"  "How bad are the symptoms (e.g., mild, moderate, severe)     Knee pain  Protocols used: Medication Question Call-A-AH

## 2022-12-15 NOTE — Telephone Encounter (Signed)
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 The problem is his right knee.   Has the patient contacted their pharmacy? Yes.   (Agent: If no, request that the patient contact the pharmacy for the refill. If patient does not wish to contact the pharmacy document the reason why and proceed with request.) (Agent: If yes, when and what did the pharmacy advise?)  Preferred Pharmacy (with phone number or street name):  [facility] [address] [place] - [address]  [address] [place] [address]  Phone: [phone] Fax: [phone]   Has the patient been seen for an appointment in the last year OR does the patient have an upcoming appointment? Yes.    Agent: Please be advised that RX refills may take up to 3 business days. We ask that you follow-up with your pharmacy.

## 2022-12-15 NOTE — Telephone Encounter (Addendum)
Routing to correct office. Cassandra from [facility] advised was sent to wrong office.

## 2022-12-15 NOTE — Telephone Encounter (Signed)
Summary: Says medication is ineffective.   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 The problem is his right knee.         Attempted to contact patient- left message to call office on VM

## 2022-12-16 ENCOUNTER — Other Ambulatory Visit: Payer: Self-pay | Admitting: Family Medicine

## 2022-12-16 ENCOUNTER — Telehealth: Payer: Self-pay | Admitting: *Deleted

## 2022-12-16 DIAGNOSIS — M1711 Unilateral primary osteoarthritis, right knee: Secondary | ICD-10-CM

## 2022-12-16 MED ORDER — TRAMADOL HCL 50 MG PO TABS: 50.0000 mg | ORAL_TABLET | Freq: Two times a day (BID) | ORAL | 0 refills | Status: AC

## 2022-12-16 NOTE — Telephone Encounter (Signed)
Requested medication (s) are due for refill today - no  Requested medication (s) are on the active medication list -yes  Future visit scheduled -yes  Last refill: 12/07/22 #30  Notes to clinic: duplicate request- non delegated Rx- there is triage call regarding this medication   Requested Prescriptions  Pending Prescriptions Disp Refills   traMADol (ULTRAM) 50 MG tablet 30 tablet 0    Sig: Take 1 tablet (50 mg total) by mouth every morning.     Not Delegated - Analgesics:  Opioid Agonists Failed - 12/15/2022 11:31 AM      Failed - This refill cannot be delegated      Failed - Urine Drug Screen completed in last 360 days      Passed - Valid encounter within last 3 months    Recent Outpatient Visits           1 week ago Hypertension associated with diabetes [facility])   [facility] Alba Cory, MD   2 months ago Acute pain of right knee   [facility] Alba Cory, MD   4 months ago Acute abdominal pain   [facility], Danna Hefty, MD   6 months ago Dyslipidemia associated with type 2 diabetes mellitus [facility])   [facility] Alba Cory, MD   10 months ago Rash in adult   [facility] Alba Cory, MD       Future Appointments             In 3 weeks Alba Cory, MD [facility]   In 10 months Deirdre Evener, MD [facility]               Requested Prescriptions  Pending Prescriptions Disp Refills   traMADol (ULTRAM) 50 MG tablet 30 tablet 0    Sig: Take 1 tablet (50 mg total) by mouth every morning.     Not Delegated - Analgesics:  Opioid Agonists Failed - 12/15/2022 11:31 AM      Failed - This refill cannot be delegated      Failed - Urine Drug Screen completed in last 360 days      Passed - Valid encounter within last 3 months    Recent Outpatient Visits            1 week ago Hypertension associated with diabetes [facility])   [facility] Alba Cory, MD   2 months ago Acute pain of right knee   [facility] Alba Cory, MD   4 months ago Acute abdominal pain   [place] [facility] Alba Cory, MD   6 months ago Dyslipidemia associated with type 2 diabetes mellitus [facility])   [place] [facility] Alba Cory, MD   10 months ago Rash in adult   [facility] Alba Cory, MD       Future Appointments             In 3 weeks Alba Cory, MD [facility]   In 10 months Deirdre Evener, MD [facility]

## 2022-12-16 NOTE — Telephone Encounter (Signed)
Pt called, message from Dr.Sowles read regarding medication, BID to WM. Verbalizes understanding.

## 2022-12-16 NOTE — Telephone Encounter (Signed)
  Chief Complaint: Medication Problem Symptoms: NA Frequency: NA Pertinent Negatives: Patient denies NA Disposition: [] ED /[] Urgent Care (no appt availability in office) / [] Appointment(In office/virtual)/ []  [facility]/ [] Home Care/ [] Refused Recommended Disposition /[] [place] [facility]/ []  Follow-up with PCP Additional Notes:   Pt states frustrated. States picked up refill of Tramadol 12/07/22 and was given 7 tabs.  Order reads #30.  States was told would need PCP to call pharmacy to order. States "Those 7 are gone and Im in pain. " States "Also want to discuss increasing  the dose or going back to Naprosyn.Reports tramadol works for 1/2 day only.  States called yesterday and no results. Pt was insistent on speaking with a provider "On call" at practice. I was able to get information and assured pt I would route to practice for review. Please advise.   Take 1 tablet (50 mg total) by mouth every morning. Dispense: 30 tablet, Refills: 0 ordered   06/

## 2022-12-22 ENCOUNTER — Encounter: Payer: Self-pay | Admitting: Oncology

## 2022-12-22 ENCOUNTER — Inpatient Hospital Stay: Payer: Medicare Other | Attending: Oncology

## 2022-12-22 ENCOUNTER — Inpatient Hospital Stay (HOSPITAL_BASED_OUTPATIENT_CLINIC_OR_DEPARTMENT_OTHER): Payer: Medicare Other | Admitting: Oncology

## 2022-12-22 VITALS — BP 150/85 | HR 84 | Temp 96.9°F | Resp 19 | Wt 206.7 lb

## 2022-12-22 DIAGNOSIS — R718 Other abnormality of red blood cells: Secondary | ICD-10-CM | POA: Insufficient documentation

## 2022-12-22 DIAGNOSIS — D696 Thrombocytopenia, unspecified: Secondary | ICD-10-CM

## 2022-12-22 DIAGNOSIS — D472 Monoclonal gammopathy: Secondary | ICD-10-CM | POA: Insufficient documentation

## 2022-12-22 LAB — CBC WITH DIFFERENTIAL/PLATELET
Abs Immature Granulocytes: 0.03 10*3/uL (ref 0.00–0.07)
Basophils Absolute: 0 10*3/uL (ref 0.0–0.1)
Basophils Relative: 1 %
Eosinophils Absolute: 0.1 10*3/uL (ref 0.0–0.5)
Eosinophils Relative: 2 %
HCT: 40.3 % (ref 39.0–52.0)
Hemoglobin: 13.1 g/dL (ref 13.0–17.0)
Immature Granulocytes: 1 %
Lymphocytes Relative: 29 %
Lymphs Abs: 1.2 10*3/uL (ref 0.7–4.0)
MCH: 35.2 pg — ABNORMAL HIGH (ref 26.0–34.0)
MCHC: 32.5 g/dL (ref 30.0–36.0)
MCV: 108.3 fL — ABNORMAL HIGH (ref 80.0–100.0)
Monocytes Absolute: 0.7 10*3/uL (ref 0.1–1.0)
Monocytes Relative: 16 %
Neutro Abs: 2.2 10*3/uL (ref 1.7–7.7)
Neutrophils Relative %: 51 %
Platelets: 112 10*3/uL — ABNORMAL LOW (ref 150–400)
RBC: 3.72 MIL/uL — ABNORMAL LOW (ref 4.22–5.81)
RDW: 13.8 % (ref 11.5–15.5)
WBC: 4.2 10*3/uL (ref 4.0–10.5)
nRBC: 0 % (ref 0.0–0.2)

## 2022-12-22 NOTE — Progress Notes (Signed)
[name]  [phone] [phone]  ID: Jeremy [name] OB: 11-13-1935  MR#: [id number]  AOZ#:[id number]  Patient Care Team: Jeremy [name] as PCP - General (Family Medicine) Requested, Self Jeremy Manila, [name] as Consulting Physician (Ophthalmology) Jeremy Saint, [name] as Consulting Physician (Orthopedic Surgery) Jeremy Millard, [name] as Consulting Physician (Cardiology) Jeremy Handing, [name] as Consulting Physician (Internal Medicine) Jeremy [name], DPM (Podiatry) Jeremy Evener, [name] (Dermatology) Jeremy Ruths, [name] as Consulting Physician (Oncology)  CHIEF COMPLAINT: Thrombocytopenia.  INTERVAL HISTORY: Patient returns to clinic today for repeat laboratory work and further evaluation.  He is having some right knee pain and is seeing orthopedics later this week, but otherwise feels well.  He has no neurologic complaints.  He denies any recent fevers or illnesses.  He has a good appetite and denies weight loss.  He has no chest pain, shortness of breath, cough, or hemoptysis.  He denies any nausea, vomiting, constipation, or diarrhea.  He has no urinary complaints.  Patient offers no further specific complaints today.  REVIEW OF SYSTEMS:   Review of Systems  Constitutional: Negative.  Negative for fever, malaise/fatigue and weight loss.  Respiratory: Negative.  Negative for cough, hemoptysis and shortness of breath.   Cardiovascular: Negative.  Negative for chest pain and leg swelling.  Gastrointestinal: Negative.  Negative for abdominal pain.  Genitourinary: Negative.  Negative for dysuria and hematuria.  Musculoskeletal:  Positive for joint pain. Negative for back pain.  Skin: Negative.  Negative for rash.  Neurological: Negative.  Negative for dizziness, focal weakness, weakness and headaches.  Endo/Heme/Allergies:  Does not bruise/bleed easily.  Psychiatric/Behavioral: Negative.  The patient is not nervous/anxious.      As per HPI. Otherwise, a complete review of systems is negative.  PAST MEDICAL HISTORY: Past Medical History:  Diagnosis Date   Allergy    Basal cell carcinoma 10/09/2021   right medial forehead at glabella, Mohs 01/22/22   Chronic kidney disease    Diabetes mellitus without complication (HCC)    Diabetic neuropathy (HCC)    Diverticula, colon 1980's   Diverticulosis    Enlarged prostate    GERD (gastroesophageal reflux disease)    Glaucoma    Hemorrhoids    High cholesterol    History of hiatal hernia    Hyperlipidemia    Hypertension    Obesity    Onychomycosis    Seizures (HCC)    Small bowel obstruction (HCC) 1990's   Urination disorder     PAST SURGICAL HISTORY: Past Surgical History:  Procedure Laterality Date   APPENDECTOMY     BLADDER DIVERTICULECTOMY  2013   CARPAL TUNNEL RELEASE  01/2022   revision right side   COLON SURGERY Left 1980's   COLONOSCOPY W/ POLYPECTOMY  1990's   [facility] [name]   COLONOSCOPY WITH PROPOFOL N/A 12/21/2014   Procedure: COLONOSCOPY WITH PROPOFOL;  Surgeon: Jeremy [name];  Location: [facility] ENDOSCOPY;  Service: Endoscopy;  Laterality: N/A;   COLONOSCOPY WITH PROPOFOL N/A 08/16/2017   Procedure: COLONOSCOPY WITH PROPOFOL;  Surgeon: Jeremy [name];  Location: [facility] ENDOSCOPY;  Service: Endoscopy;  Laterality: N/A;   COLONOSCOPY WITH PROPOFOL N/A 01/31/2021   Procedure: COLONOSCOPY WITH PROPOFOL;  Surgeon: Jeremy [name];  Location: [facility] ENDOSCOPY;  Service: Endoscopy;  Laterality: N/A;   ESOPHAGOGASTRODUODENOSCOPY (EGD) WITH PROPOFOL N/A 02/15/2015   Procedure: ESOPHAGOGASTRODUODENOSCOPY (EGD) WITH PROPOFOL;  Surgeon: Jeremy [name];  Location: [facility] ENDOSCOPY;  Service: Endoscopy;  Laterality: N/A;  ESOPHAGOGASTRODUODENOSCOPY (EGD) WITH PROPOFOL N/A 06/10/2017   Procedure: ESOPHAGOGASTRODUODENOSCOPY (EGD) WITH PROPOFOL;  Surgeon: Jeremy [name];  Location: [facility] ENDOSCOPY;  Service: Endoscopy;   Laterality: N/A;   ESOPHAGOGASTRODUODENOSCOPY (EGD) WITH PROPOFOL N/A 08/16/2017   Procedure: ESOPHAGOGASTRODUODENOSCOPY (EGD) WITH PROPOFOL;  Surgeon: Jeremy [name];  Location: [facility] ENDOSCOPY;  Service: Endoscopy;  Laterality: N/A;   ESOPHAGOGASTRODUODENOSCOPY (EGD) WITH PROPOFOL N/A 01/31/2021   Procedure: ESOPHAGOGASTRODUODENOSCOPY (EGD) WITH PROPOFOL;  Surgeon: Jeremy [name];  Location: [facility] ENDOSCOPY;  Service: Endoscopy;  Laterality: N/A;   EYE SURGERY     GLAUCOMA SURGERY Left 1990's   [facility]   HEMORROIDECTOMY  1980's   [facility]   HERNIA REPAIR     Ventral, Jeremy [name] HERNIA REPAIR Bilateral [phone]   LARYNX SURGERY  1990   3 times   MOHS SURGERY  01/22/2022   Jeremy [name] - Jeremy [name]   RETINAL DETACHMENT SURGERY Right    TONSILLECTOMY     turp  2013   URETER SURGERY  2013   Jeremy [name]    FAMILY HISTORY: Family History  Problem Relation Age of Onset   Diabetes Mother    Cataracts Mother    Metabolic syndrome Mother    Blindness Mother        r/t diabetes   Cataracts Father    Glaucoma Father        Retina-Detachment   Atrial fibrillation Father    Diverticulitis Sister    Glaucoma Brother    Diabetes Brother    Cancer Sister        Gallbladder   Cancer Daughter     ADVANCED DIRECTIVES (Y/N):  N  HEALTH MAINTENANCE: Social History   Tobacco Use   Smoking status: Never   Smokeless tobacco: Never   Tobacco comments:    smoking cessation materials not required  Vaping Use   Vaping Use: Never used  Substance Use Topics   Alcohol use: No   Drug use: No     Colonoscopy:  PAP:  Bone density:  Lipid panel:  Allergies  Allergen Reactions   Demerol [Meperidine] Other (See Comments) and Anaphylaxis    MAKES BLOOD PRESSURE BOTTOM OUT hypotension   Dilaudid [Hydromorphone Hcl]    Hydromorphone     Other reaction(s): Unknown MAKES BLOOD PRESSURE BOTTOM OUT   Meperidine Hcl    Actos [Pioglitazone] Other (See  Comments) and Hives   Dapagliflozin Rash   Penicillins Rash    Current Outpatient Medications  Medication Sig Dispense Refill   bisoprolol (ZEBETA) 10 MG tablet Take 1 tablet (10 mg total) by mouth daily. 90 tablet 1   brimonidine (ALPHAGAN P) 0.1 % SOLN Place 1 drop into the left eye 2 (two) times daily.      desonide (DESOWEN) 0.05 % ointment Apply to rash under eye QD-BID PRN 15 g 1   dorzolamide (TRUSOPT) 2 % ophthalmic solution Place 1 drop into the left eye 2 (two) times daily.  3   empagliflozin (JARDIANCE) 25 MG TABS tablet Take by mouth daily.     Exenatide ER (BYDUREON BCISE) 2 MG/0.85ML AUIJ Inject 2 mg into the skin once a week.     finasteride (PROSCAR) 5 MG tablet Take 1 tablet by mouth once daily 90 tablet 0   glipiZIDE (GLUCOTROL) 10 MG tablet Take 10 mg by mouth daily.     hydrocortisone 2.5 % cream APPLY TO THE AFFECTED AREA OF [name] AND EARS EVERY OTHER NIGHT ALTERNATING WITH KETOCONAZOLE  CREAM 28 g 0   insulin degludec (TRESIBA FLEXTOUCH) 200 UNIT/ML FlexTouch Pen Inject 45 Units into the skin in the morning.     ketoconazole (NIZORAL) 2 % cream Apply to penis twice daily 60 g 3   ketoconazole (NIZORAL) 2 % shampoo Apply 1 Application topically 3 (three) times a week. Shampoo scalp 3 days a week let sit 5 minutes and rinse off 120 mL 3   metFORMIN (GLUCOPHAGE) 1000 MG tablet Take 1,000 mg by mouth 2 (two) times daily with a meal.     mometasone (ELOCON) 0.1 % cream Apply topically daily. 45 g 0   mometasone (ELOCON) 0.1 % lotion APPLY TO THE AFFECTED AREAS OF SCALP THREE NIGHTS PER WEEK 60 mL 0   omega-3 acid ethyl esters (LOVAZA) 1 g capsule Take 2 capsules (2 g total) by mouth 2 (two) times daily. 480 capsule 3   omeprazole (PRILOSEC) 20 MG capsule Take 1 capsule (20 mg total) by mouth daily. 90 capsule 0   rosuvastatin (CRESTOR) 10 MG tablet Take 1 tablet (10 mg total) by mouth at bedtime. 90 tablet 1   Semaglutide,0.25 or 0.5MG /DOS, (OZEMPIC, 0.25 OR 0.5 MG/DOSE,) 2  MG/1.5ML SOPN Inject into the skin.     tadalafil (CIALIS) 20 MG tablet TAKE 1/2 TO 1 (ONE-HALF TO ONE) TABLET BY MOUTH EVERY OTHER DAY AS NEEDED FOR ERECTILE DYSFUNCTION 30 tablet 0   telmisartan-hydrochlorothiazide (MICARDIS HCT) 40-12.5 MG tablet Take 1 tablet by mouth daily. 30 tablet 0   timolol (TIMOPTIC) 0.5 % ophthalmic solution INSTILL 1 DROP INTO LEFT EYE TWICE DAILY     traMADol (ULTRAM) 50 MG tablet Take 1 tablet (50 mg total) by mouth 2 (two) times daily. Chronic pain 60 tablet 0   No current facility-administered medications for this visit.    OBJECTIVE: Vitals:   12/22/22 1409  BP: (!) 150/85  Pulse: 84  Resp: 19  Temp: (!) 96.9 F (36.1 C)  SpO2: 97%     Body mass index is 25.84 kg/m.    ECOG FS:0 - Asymptomatic  General: Well-developed, well-nourished, no acute distress. Eyes: Pink conjunctiva, anicteric sclera. HEENT: Normocephalic, moist mucous membranes. Lungs: No audible wheezing or coughing. Heart: Regular rate and rhythm. Abdomen: Soft, nontender, no obvious distention. Musculoskeletal: No edema, cyanosis, or clubbing. Neuro: Alert, answering all questions appropriately. Cranial nerves grossly intact. Skin: No rashes or petechiae noted. Psych: Normal affect.   LAB RESULTS:  Lab Results  Component Value Date   NA 134 (L) 07/24/2022   K 3.8 07/24/2022   CL 99 07/24/2022   CO2 23 07/24/2022   GLUCOSE 95 07/24/2022   BUN 21 07/24/2022   CREATININE 0.84 07/24/2022   CALCIUM 8.5 (L) 07/24/2022   PROT 7.0 07/24/2022   ALBUMIN 4.0 07/24/2022   AST 45 (H) 07/24/2022   ALT 42 07/24/2022   ALKPHOS 62 07/24/2022   BILITOT 1.7 (H) 07/24/2022   GFRNONAA >60 07/24/2022   GFRAA 99 02/28/2020    Lab Results  Component Value Date   WBC 4.2 12/22/2022   NEUTROABS 2.2 12/22/2022   HGB 13.1 12/22/2022   HCT 40.3 12/22/2022   MCV 108.3 (H) 12/22/2022   PLT 112 (L) 12/22/2022     STUDIES: MR KNEE RIGHT WO CONTRAST  Result Date: 11/26/2022 CLINICAL  DATA:  Right knee pain and swelling for 2 months. Previous arthrocentesis. History of diabetes. EXAM: MRI OF THE RIGHT KNEE WITHOUT CONTRAST TECHNIQUE: Multiplanar, multisequence MR imaging of the knee was performed. No intravenous contrast  was administered. COMPARISON:  None Available. FINDINGS: MENISCI Medial meniscus:  Intact with normal morphology. Lateral meniscus: Probable degenerative tearing of the body and anterior horn of the lateral meniscus which is partially extruded peripherally from the joint. No centrally displaced meniscal fragments are identified. LIGAMENTS Cruciates: Anterior and posterior cruciate ligaments are intact. There is ACL mucoid degeneration. Collaterals: The medial and lateral collateral ligament complexes are intact. CARTILAGE Patellofemoral: Mild chondral thinning and surface irregularity with scattered subchondral cyst formation in the trochlea. Medial:  Minimal chondral thinning without focal defect. Lateral: Mild chondral thinning and surface irregularity with peripheral osteophytes. There is mild depression of the weight-bearing articular surface of the lateral tibial plateau peripherally with associated subchondral edema, suspicious for a subacute fracture. MISCELLANEOUS Joint: Moderate-sized, mildly complex joint effusion. No evidence of lipohemarthrosis. Possible small dependent loose bodies or debris in the joint. Popliteal Fossa: The popliteus muscle and tendon are intact. No significant Baker's cyst. Extensor Mechanism: The visualized quadriceps and patellar tendons are intact. Bones: As above, suspected subacute fracture of the lateral tibial plateau which could reflect an insufficiency fracture. Other: Generalized subcutaneous edema surrounding the knee, greatest anteriorly. Scattered small varicosities. IMPRESSION: 1. Suspected subacute fracture of the lateral tibial plateau with mild depression of the weight-bearing articular surface peripherally. Recommend plain film  correlation. 2. Underlying mild tricompartmental degenerative changes with moderate-sized joint effusion and possible small dependent loose bodies or debris. 3. Probable degenerative tearing of the body and anterior horn of the lateral meniscus. The medial meniscus, cruciate and collateral ligaments are intact. Electronically Signed   By: Carey Bullocks M.D.   On: 11/26/2022 15:17    ASSESSMENT: Thrombocytopenia.  PLAN:    Thrombocytopenia: Chronic and unchanged.  Upon review of the records patient has had a mild, intermittent thrombocytopenia since September 2013 ranging from 79-170.  Today's result is 112.  All of his other laboratory work is either negative or within normal limits.  [employer] myeloid panel was drawn for completeness and is pending at time of dictation.  No intervention is needed at this time.  Patient does not require bone marrow biopsy.  After lengthy discussion with patient, is agreed upon that no further follow-up is necessary.  Continue to monitor CBC 1-2 times per year and refer patient back if there are any questions or concerns.  Elevated MCV: Mild.  Folate and B12 levels are within normal limits. MGUS: Patient noted to have an M spike of 0.2.  Likely clinically insignificant.   Patient expressed understanding and was in agreement with this plan. He also understands that He can call clinic at any time with any questions, concerns, or complaints.    Jeremy Ruths, [name]   12/22/2022 3:01 PM

## 2022-12-25 DIAGNOSIS — M25561 Pain in right knee: Secondary | ICD-10-CM | POA: Diagnosis not present

## 2022-12-25 DIAGNOSIS — M1711 Unilateral primary osteoarthritis, right knee: Secondary | ICD-10-CM | POA: Diagnosis not present

## 2022-12-25 DIAGNOSIS — S83281A Other tear of lateral meniscus, current injury, right knee, initial encounter: Secondary | ICD-10-CM | POA: Diagnosis not present

## 2022-12-28 DIAGNOSIS — M1711 Unilateral primary osteoarthritis, right knee: Secondary | ICD-10-CM | POA: Diagnosis not present

## 2023-01-02 ENCOUNTER — Other Ambulatory Visit: Payer: Self-pay | Admitting: Family Medicine

## 2023-01-02 DIAGNOSIS — I152 Hypertension secondary to endocrine disorders: Secondary | ICD-10-CM

## 2023-01-05 ENCOUNTER — Other Ambulatory Visit: Payer: Self-pay | Admitting: Family Medicine

## 2023-01-05 DIAGNOSIS — N400 Enlarged prostate without lower urinary tract symptoms: Secondary | ICD-10-CM

## 2023-01-05 NOTE — Progress Notes (Unsigned)
Name: Jeremy [name]   MRN: [id number]    DOB: 12-29-1935   Date:01/06/2023       Progress Note  Subjective  Chief Complaint  Follow Up  HPI  HTN: bp today is at goal 134/70, recent visit to hematologist bp was elevated at 150/85 he states that was when he was taking lower dose of telmisartan hydrochlorothiazide 40/12.5 mg .Over the past week he has been taking 80/12.5 mg dose and bp is at goal .  He denies dizziness/orthostatic changes. We will switch back to 80/12.5 mg dose and monitor   He is going to have Right knee replacement surgery on 03/04/2023 . He sees Endo and Cardiologist and explained he may need clearance from them.   He had general surgery in the past without complications except for nausea.    Patient Active Problem List   Diagnosis Date Noted   Thrombocytopenia (HCC) 06/03/2022   B12 deficiency 06/03/2022   Basal cell carcinoma (BCC) of skin of face 11/03/2021   Dyslipidemia associated with type 2 diabetes mellitus (HCC) 11/03/2021   Impingement syndrome of shoulder region 11/16/2018   Atherosclerosis of aorta (HCC) 07/21/2018   History of diverticulitis of colon 11/15/2017   Hyperlipidemia due to type 2 diabetes mellitus (HCC) 03/14/2017   Bilateral lower extremity edema 03/27/2016   Vitamin D deficiency 12/05/2015   Hx of transient ischemic attack (TIA) 09/17/2015   Diabetes mellitus type 2 without retinopathy (HCC) 05/31/2015   Hyperlipidemia 05/30/2015   Type 2 diabetes mellitus with renal manifestations (HCC) 01/08/2015   Incisional hernia, without obstruction or gangrene 03/23/2013   Enlarged prostate with lower urinary tract symptoms (LUTS) 11/03/2012   Myopic degeneration, bilateral 08/12/2012   Nonexudative age-related macular degeneration 07/15/2012   Anisometropia 04/08/2012   Pseudoaphakia 04/08/2012   History of retinal detachment 04/08/2012   Presence of intraocular lens 04/08/2012   Secondary open-angle glaucoma of left eye, severe stage  01/13/2012   Benign essential HTN 02/03/2007   Hypertension associated with diabetes (HCC) 02/03/2007    Past Surgical History:  Procedure Laterality Date   APPENDECTOMY     BLADDER DIVERTICULECTOMY  2013   CARPAL TUNNEL RELEASE  01/2022   revision right side   COLON SURGERY Left 1980's   COLONOSCOPY W/ POLYPECTOMY  1990's   Dr Okey Dupre   COLONOSCOPY WITH PROPOFOL N/A 12/21/2014   Procedure: COLONOSCOPY WITH PROPOFOL;  Surgeon: Christena Deem, MD;  Location: [facility] ENDOSCOPY;  Service: Endoscopy;  Laterality: N/A;   COLONOSCOPY WITH PROPOFOL N/A 08/16/2017   Procedure: COLONOSCOPY WITH PROPOFOL;  Surgeon: Christena Deem, MD;  Location: [facility] ENDOSCOPY;  Service: Endoscopy;  Laterality: N/A;   COLONOSCOPY WITH PROPOFOL N/A 01/31/2021   Procedure: COLONOSCOPY WITH PROPOFOL;  Surgeon: Earline Mayotte, MD;  Location: [facility] ENDOSCOPY;  Service: Endoscopy;  Laterality: N/A;   ESOPHAGOGASTRODUODENOSCOPY (EGD) WITH PROPOFOL N/A 02/15/2015   Procedure: ESOPHAGOGASTRODUODENOSCOPY (EGD) WITH PROPOFOL;  Surgeon: Christena Deem, MD;  Location: [facility] ENDOSCOPY;  Service: Endoscopy;  Laterality: N/A;   ESOPHAGOGASTRODUODENOSCOPY (EGD) WITH PROPOFOL N/A 06/10/2017   Procedure: ESOPHAGOGASTRODUODENOSCOPY (EGD) WITH PROPOFOL;  Surgeon: Christena Deem, MD;  Location: [facility] ENDOSCOPY;  Service: Endoscopy;  Laterality: N/A;   ESOPHAGOGASTRODUODENOSCOPY (EGD) WITH PROPOFOL N/A 08/16/2017   Procedure: ESOPHAGOGASTRODUODENOSCOPY (EGD) WITH PROPOFOL;  Surgeon: Christena Deem, MD;  Location: [facility] ENDOSCOPY;  Service: Endoscopy;  Laterality: N/A;   ESOPHAGOGASTRODUODENOSCOPY (EGD) WITH PROPOFOL N/A 01/31/2021   Procedure: ESOPHAGOGASTRODUODENOSCOPY (EGD) WITH PROPOFOL;  Surgeon: Earline Mayotte, MD;  Location: [facility] ENDOSCOPY;  Service: Endoscopy;  Laterality: N/A;   EYE SURGERY     GLAUCOMA SURGERY Left 1990's   [facility]   HEMORROIDECTOMY  [phone]   [facility]   HERNIA REPAIR     [facility],  Dr Carol Ada HERNIA REPAIR Bilateral [phone]   LARYNX SURGERY  1990   3 times   MOHS SURGERY  01/22/2022   Dr. Verdell Face - [facility]   RETINAL DETACHMENT SURGERY Right    TONSILLECTOMY     turp  2013   URETER SURGERY  2013   Dr Achilles Dunk    Family History  Problem Relation Age of Onset   Diabetes Mother    Cataracts Mother    Metabolic syndrome Mother    Blindness Mother        r/t diabetes   Cataracts Father    Glaucoma Father        Retina-Detachment   Atrial fibrillation Father    Diverticulitis Sister    Glaucoma Brother    Diabetes Brother    Cancer Sister        Gallbladder   Cancer Daughter     Social History   Tobacco Use   Smoking status: Never   Smokeless tobacco: Never   Tobacco comments:    smoking cessation materials not required  Substance Use Topics   Alcohol use: No     Current Outpatient Medications:    bisoprolol (ZEBETA) 10 MG tablet, Take 1 tablet (10 mg total) by mouth daily., Disp: 90 tablet, Rfl: 1   brimonidine (ALPHAGAN P) 0.1 % SOLN, Place 1 drop into the left eye 2 (two) times daily. , Disp: , Rfl:    desonide (DESOWEN) 0.05 % ointment, Apply to rash under eye QD-BID PRN, Disp: 15 g, Rfl: 1   dorzolamide (TRUSOPT) 2 % ophthalmic solution, Place 1 drop into the left eye 2 (two) times daily., Disp: , Rfl: 3   empagliflozin (JARDIANCE) 25 MG TABS tablet, Take by mouth daily., Disp: , Rfl:    Exenatide ER (BYDUREON BCISE) 2 MG/0.85ML AUIJ, Inject 2 mg into the skin once a week., Disp: , Rfl:    finasteride (PROSCAR) 5 MG tablet, Take 1 tablet by mouth once daily, Disp: 90 tablet, Rfl: 0   glipiZIDE (GLUCOTROL) 10 MG tablet, Take 10 mg by mouth daily., Disp: , Rfl:    hydrocortisone 2.5 % cream, APPLY TO THE AFFECTED AREA OF FACE AND EARS EVERY OTHER NIGHT ALTERNATING WITH KETOCONAZOLE CREAM, Disp: 28 g, Rfl: 0   insulin degludec (TRESIBA FLEXTOUCH) 200 UNIT/ML FlexTouch Pen, Inject 45 Units into the skin in the morning., Disp: , Rfl:     ketoconazole (NIZORAL) 2 % cream, Apply to penis twice daily, Disp: 60 g, Rfl: 3   ketoconazole (NIZORAL) 2 % shampoo, Apply 1 Application topically 3 (three) times a week. Shampoo scalp 3 days a week let sit 5 minutes and rinse off, Disp: 120 mL, Rfl: 3   metFORMIN (GLUCOPHAGE) 1000 MG tablet, Take 1,000 mg by mouth 2 (two) times daily with a meal., Disp: , Rfl:    mometasone (ELOCON) 0.1 % cream, Apply topically daily., Disp: 45 g, Rfl: 0   mometasone (ELOCON) 0.1 % lotion, APPLY TO THE AFFECTED AREAS OF SCALP THREE NIGHTS PER WEEK, Disp: 60 mL, Rfl: 0   omega-3 acid ethyl esters (LOVAZA) 1 g capsule, Take 2 capsules (2 g total) by mouth 2 (two) times daily., Disp: 480 capsule, Rfl: 3   omeprazole (PRILOSEC) 20 MG capsule, Take  1 capsule (20 mg total) by mouth daily., Disp: 90 capsule, Rfl: 0   rosuvastatin (CRESTOR) 10 MG tablet, Take 1 tablet (10 mg total) by mouth at bedtime., Disp: 90 tablet, Rfl: 1   Semaglutide,0.25 or 0.5MG /DOS, (OZEMPIC, 0.25 OR 0.5 MG/DOSE,) 2 MG/1.5ML SOPN, Inject into the skin., Disp: , Rfl:    tadalafil (CIALIS) 20 MG tablet, TAKE 1/2 TO 1 (ONE-HALF TO ONE) TABLET BY MOUTH EVERY OTHER DAY AS NEEDED FOR ERECTILE DYSFUNCTION, Disp: 30 tablet, Rfl: 0   telmisartan-hydrochlorothiazide (MICARDIS HCT) 40-12.5 MG tablet, Take 1 tablet by mouth once daily, Disp: 30 tablet, Rfl: 0   timolol (TIMOPTIC) 0.5 % ophthalmic solution, INSTILL 1 DROP INTO LEFT EYE TWICE DAILY, Disp: , Rfl:    traMADol (ULTRAM) 50 MG tablet, Take 1 tablet (50 mg total) by mouth 2 (two) times daily. Chronic pain, Disp: 60 tablet, Rfl: 0  Allergies  Allergen Reactions   Demerol [Meperidine] Other (See Comments) and Anaphylaxis    MAKES BLOOD PRESSURE BOTTOM OUT hypotension   Dilaudid [Hydromorphone Hcl]    Hydromorphone     Other reaction(s): Unknown MAKES BLOOD PRESSURE BOTTOM OUT   Meperidine Hcl    Actos [Pioglitazone] Other (See Comments) and Hives   Dapagliflozin Rash   Penicillins Rash     I personally reviewed active problem list, medication list, allergies, family history, social history, health maintenance with the patient/caregiver today.   ROS  Ten systems reviewed and is negative except as mentioned in HPI    Objective  Vitals:   01/06/23 1421  BP: 134/70  Pulse: 90  Resp: 18  Temp: 97.8 F (36.6 C)  TempSrc: Oral  SpO2: 92%  Weight: 200 lb 1.6 oz (90.8 kg)  Height: 6' 3.5" (1.918 m)    Body mass index is 24.68 kg/m.  Physical Exam  Constitutional: Patient appears well-developed and well-nourished. No distress.  HEENT: head atraumatic, normocephalic, pupils equal and reactive to light, neck supple, throat within normal limits Cardiovascular: Normal rate, regular rhythm and normal heart sounds.  No murmur heard. No BLE edema. Pulmonary/Chest: Effort normal and breath sounds normal. No respiratory distress. Abdominal: Soft.  There is no tenderness. Psychiatric: Patient has a normal mood and affect. behavior is normal. Judgment and thought content normal.   Recent Results (from the past 2160 hour(s))  HM DIABETES EYE EXAM     Status: Abnormal   Collection Time: 10/29/22 12:00 AM  Result Value Ref Range   HM Diabetic Eye Exam Retinopathy (A) No Retinopathy    Comment: AEC-no treatment required  Synovial cell count + diff, w/ crystals     Status: Abnormal   Collection Time: 11/03/22  4:15 PM  Result Value Ref Range   Color, Synovial AMBER (A) YELLOW   Appearance-Synovial TURBID (A) CLEAR   Crystals, Fluid NO CRYSTALS SEEN    WBC, Synovial 166 0 - 200 /cu mm   Neutrophil, Synovial 39 %   Lymphocytes-Synovial Fld 43 %   Monocyte-Macrophage-Synovial Fluid 18 %   Eosinophils-Synovial 0 %    Comment: Performed at [facility], [address] [place], [place] [address]  CBC with Differential     Status: Abnormal   Collection Time: 12/22/22  1:51 PM  Result Value Ref Range   WBC 4.2 4.0 - 10.5 K/uL   RBC 3.72 (L) 4.22 - 5.81 MIL/uL    Hemoglobin 13.1 13.0 - 17.0 g/dL   HCT 19.1 47.8 - 29.5 %   MCV 108.3 (H) 80.0 - 100.0 fL  MCH 35.2 (H) 26.0 - 34.0 pg   MCHC 32.5 30.0 - 36.0 g/dL   RDW 91.4 78.2 - 95.6 %   Platelets 112 (L) 150 - 400 K/uL   nRBC 0.0 0.0 - 0.2 %   Neutrophils Relative % 51 %   Neutro Abs 2.2 1.7 - 7.7 K/uL   Lymphocytes Relative 29 %   Lymphs Abs 1.2 0.7 - 4.0 K/uL   Monocytes Relative 16 %   Monocytes Absolute 0.7 0.1 - 1.0 K/uL   Eosinophils Relative 2 %   Eosinophils Absolute 0.1 0.0 - 0.5 K/uL   Basophils Relative 1 %   Basophils Absolute 0.0 0.0 - 0.1 K/uL   Immature Granulocytes 1 %   Abs Immature Granulocytes 0.03 0.00 - 0.07 K/uL    Comment: Performed at [facility], [address] [place], [place] [address]    PHQ2/9:    01/06/2023    2:23 PM 12/07/2022    1:24 PM 10/05/2022    3:31 PM 07/24/2022    8:11 AM 06/03/2022    1:16 PM  Depression screen PHQ 2/9  Decreased Interest 0 0 0 0 0  Down, Depressed, Hopeless 0 0 0 0 0  PHQ - 2 Score 0 0 0 0 0  Altered sleeping 0 0 0 0 0  Tired, decreased energy 0 0 0 0 0  Change in appetite 0 0 0 0 0  Feeling bad or failure about yourself  0 0 0 0 0  Trouble concentrating 0 0 0 0 0  Moving slowly or fidgety/restless 0 0 0 0 0  Suicidal thoughts 0 0 0 0 0  PHQ-9 Score 0 0 0 0 0  Difficult doing work/chores    Not difficult at all     phq 9 is negative   Fall Risk:    01/06/2023    2:23 PM 12/07/2022    1:24 PM 10/05/2022    3:30 PM 07/24/2022    8:11 AM 06/03/2022    1:15 PM  Fall Risk   Falls in the past year? 0 0 0 0 0  Number falls in past yr:  0 0 0   Injury with Fall?  0 0 0   Risk for fall due to : Impaired balance/gait Impaired balance/gait Impaired balance/gait No Fall Risks No Fall Risks  Follow up Falls prevention discussed;Education provided Falls prevention discussed  Falls prevention discussed;Education provided;Falls evaluation completed Falls prevention discussed;Education provided;Falls evaluation completed      Functional Status Survey: Is the patient deaf or have difficulty hearing?: No Does the patient have difficulty seeing, even when wearing glasses/contacts?: No Does the patient have difficulty concentrating, remembering, or making decisions?: No Does the patient have difficulty walking or climbing stairs?: Yes Does the patient have difficulty dressing or bathing?: No Does the patient have difficulty doing errands alone such as visiting a doctor's office or shopping?: No    Assessment & Plan   1. Hypertension, benign  - telmisartan-hydrochlorothiazide (MICARDIS HCT) 80-12.5 MG tablet; Take 1 tablet by mouth daily.  Dispense: 90 tablet; Refill: 0  2. Hypertension associated with diabetes (HCC)  - telmisartan-hydrochlorothiazide (MICARDIS HCT) 80-12.5 MG tablet; Take 1 tablet by mouth daily.  Dispense: 90 tablet; Refill: 0

## 2023-01-06 ENCOUNTER — Ambulatory Visit (INDEPENDENT_AMBULATORY_CARE_PROVIDER_SITE_OTHER): Payer: Medicare Other | Admitting: Family Medicine

## 2023-01-06 ENCOUNTER — Encounter: Payer: Self-pay | Admitting: Family Medicine

## 2023-01-06 VITALS — BP 134/70 | HR 90 | Temp 97.8°F | Resp 18 | Ht 75.5 in | Wt 200.1 lb

## 2023-01-06 DIAGNOSIS — I152 Hypertension secondary to endocrine disorders: Secondary | ICD-10-CM

## 2023-01-06 DIAGNOSIS — E1159 Type 2 diabetes mellitus with other circulatory complications: Secondary | ICD-10-CM

## 2023-01-06 DIAGNOSIS — I10 Essential (primary) hypertension: Secondary | ICD-10-CM | POA: Diagnosis not present

## 2023-01-06 MED ORDER — TELMISARTAN-HCTZ 80-12.5 MG PO TABS
1.0000 | ORAL_TABLET | Freq: Every day | ORAL | 0 refills | Status: DC
Start: 2023-01-06 — End: 2023-05-10

## 2023-01-08 LAB — INTELLIGEN MYELOID

## 2023-01-16 ENCOUNTER — Other Ambulatory Visit: Payer: Self-pay | Admitting: Family Medicine

## 2023-01-16 DIAGNOSIS — M1711 Unilateral primary osteoarthritis, right knee: Secondary | ICD-10-CM

## 2023-01-18 ENCOUNTER — Other Ambulatory Visit: Payer: Self-pay | Admitting: Family Medicine

## 2023-01-18 MED ORDER — TRAMADOL HCL 50 MG PO TABS: 50.0000 mg | ORAL_TABLET | Freq: Two times a day (BID) | ORAL | 0 refills | Status: DC

## 2023-01-22 ENCOUNTER — Ambulatory Visit: Payer: Self-pay

## 2023-01-22 NOTE — Telephone Encounter (Signed)
Chief Complaint: SOB Symptoms: mild SOB  Frequency: comes and goes  Pertinent Negatives: Patient denies chest pain, cough, fever, and other respiratory symptoms Disposition: [] ED /[] Urgent Care (no appt availability in office) / [x] Appointment(In office/virtual)/ []  [facility]/ [] Home Care/ [] Refused Recommended Disposition /[] [facility]/ []  Follow-up with PCP Additional Notes: Patient stated he has been experience mild shortness of breath without any other symptoms. Patient stated it started about a week ago but 2 days ago he noticed it has been happening more often especially when lying down. Patient stated he thinks he may be related to anemia. Advised patient that he will need to be evaluated for his symptoms. Patient is agreeable but wants to come into the office. Patient has been scheduled 01/25/23. Advised patient to callback if symptoms get worse.    Reason for Disposition  [1] MILD difficulty breathing (e.g., minimal/no SOB at rest, SOB with walking, pulse <100) AND [2] NEW-onset or WORSE than normal  Answer Assessment - Initial Assessment Questions 1. RESPIRATORY STATUS: "Describe your breathing?" (e.g., wheezing, shortness of breath, unable to speak, severe coughing)      SOB 2. ONSET: "When did this breathing problem begin?"      About 1 week ago  3. PATTERN "Does the difficult breathing come and go, or has it been constant since it started?"      Comes and goes  4. SEVERITY: "How bad is your breathing?" (e.g., mild, moderate, severe)    - MILD: No SOB at rest, mild SOB with walking, speaks normally in sentences, can lie down, no retractions, pulse < 100.    - MODERATE: SOB at rest, SOB with minimal exertion and prefers to sit, cannot lie down flat, speaks in phrases, mild retractions, audible wheezing, pulse 100-120.    - SEVERE: Very SOB at rest, speaks in single words, struggling to breathe, sitting hunched forward, retractions, pulse > 120       Moderate 5. RECURRENT SYMPTOM: "Have you had difficulty breathing before?" If Yes, ask: "When was the last time?" and "What happened that time?"      No 6. CARDIAC HISTORY: "Do you have any history of heart disease?" (e.g., heart attack, angina, bypass surgery, angioplasty)      No 7. LUNG HISTORY: "Do you have any history of lung disease?"  (e.g., pulmonary embolus, asthma, emphysema)     No 8. CAUSE: "What do you think is causing the breathing problem?"      Anemia  9. OTHER SYMPTOMS: "Do you have any other symptoms? (e.g., dizziness, runny nose, cough, chest pain, fever)     No  Protocols used: Breathing Difficulty-A-AH

## 2023-01-25 ENCOUNTER — Ambulatory Visit (INDEPENDENT_AMBULATORY_CARE_PROVIDER_SITE_OTHER): Payer: Medicare Other | Admitting: Family Medicine

## 2023-01-25 ENCOUNTER — Encounter: Payer: Self-pay | Admitting: Family Medicine

## 2023-01-25 VITALS — BP 126/74 | HR 83 | Temp 98.1°F | Resp 16 | Ht 75.5 in | Wt 206.1 lb

## 2023-01-25 DIAGNOSIS — R0600 Dyspnea, unspecified: Secondary | ICD-10-CM

## 2023-01-25 DIAGNOSIS — B351 Tinea unguium: Secondary | ICD-10-CM | POA: Diagnosis not present

## 2023-01-25 DIAGNOSIS — I10 Essential (primary) hypertension: Secondary | ICD-10-CM

## 2023-01-25 DIAGNOSIS — E1142 Type 2 diabetes mellitus with diabetic polyneuropathy: Secondary | ICD-10-CM | POA: Diagnosis not present

## 2023-01-25 DIAGNOSIS — R710 Precipitous drop in hematocrit: Secondary | ICD-10-CM | POA: Diagnosis not present

## 2023-01-25 DIAGNOSIS — K2930 Chronic superficial gastritis without bleeding: Secondary | ICD-10-CM | POA: Diagnosis not present

## 2023-01-25 LAB — CBC WITH DIFFERENTIAL/PLATELET
Absolute Monocytes: 533 cells/uL (ref 200–950)
Basophils Absolute: 8 cells/uL (ref 0–200)
Basophils Relative: 0.2 %
Eosinophils Absolute: 49 cells/uL (ref 15–500)
Eosinophils Relative: 1.2 %
HCT: 38.2 % — ABNORMAL LOW (ref 38.5–50.0)
Hemoglobin: 13 g/dL — ABNORMAL LOW (ref 13.2–17.1)
Lymphs Abs: 1119 cells/uL (ref 850–3900)
MCH: 34.6 pg — ABNORMAL HIGH (ref 27.0–33.0)
MCHC: 34 g/dL (ref 32.0–36.0)
MCV: 101.6 fL — ABNORMAL HIGH (ref 80.0–100.0)
MPV: 11.7 fL (ref 7.5–12.5)
Monocytes Relative: 13 %
Neutro Abs: 2390 cells/uL (ref 1500–7800)
Neutrophils Relative %: 58.3 %
Platelets: 101 10*3/uL — ABNORMAL LOW (ref 140–400)
RBC: 3.76 10*6/uL — ABNORMAL LOW (ref 4.20–5.80)
RDW: 12.1 % (ref 11.0–15.0)
Total Lymphocyte: 27.3 %
WBC: 4.1 10*3/uL (ref 3.8–10.8)

## 2023-01-25 NOTE — Progress Notes (Signed)
Patient ID: Jeremy [name], male    DOB: 06-08-1936, 87 y.o.   MRN: [id number]  PCP: Alba Cory, MD  Chief Complaint  Patient presents with   Shortness of Breath    Mild-moderate comes and goes, Patient stated it started about a week ago but 2 days ago he noticed it has been happening more often especially when lying down. Patient stated he thinks he may be related to anemia.    Subjective:   Jeremy [name] is a 87 y.o. male, presents to clinic with CC of the following:  HPI  SOB first thing in the morning when getting up out of bed, it takes him a while to sit on the edge of his bed and takes a while to catch his breath.  Feels like " air hunger" he denies any palpitations, chest pain, tightness or wheeze, lower extremity edema.  He states he does not have the same symptoms when he lays down to go to bed but is always present first thing in the morning getting up.  He wanted HR/rhythm checked and hemoglobin he is concerned that he has anemia because his hemoglobin decreased with his last labs and because with chronic knee pain he was taking NSAIDs his PCP recently stopped the NSAIDs about 3 weeks ago his lab work was done about 2 weeks before that he has not noted any blood in his stool, he denies abdominal pain and indigestion.  He refuses to do Hemoccult stool cards  Reviewed his labs - no anemia Hemoglobin  Date Value Ref Range Status  12/22/2022 13.1 13.0 - 17.0 g/dL Final  78/46/9629 52.8 13.0 - 17.0 g/dL Final  41/32/4401 02.7 13.0 - 17.0 g/dL Final  25/36/6440 34.7 13.2 - 17.1 g/dL Final  42/59/5638 75.6 12.6 - 17.7 g/dL Final   HGB  Date Value Ref Range Status  03/14/2013 14.1 13.0 - 18.0 g/dL Final  43/32/9518 84.1 (L) 13.0 - 18.0 g/dL Final  66/11/3014 01.0 (L) 13.0 - 18.0 g/dL Final  93/23/5573 22.0 13.0 - 18.0 g/dL Final   Pt here for SOB concerned related to "anemia" however the pt is not anemic Reviewed recent hematology OV: Thrombocytopenia: Chronic  and unchanged.  Upon review of the records patient has had a mild, intermittent thrombocytopenia since September 2013 ranging from 79-170.  Today's result is 112.  All of his other laboratory work is either negative or within normal limits.  IntelliGEN myeloid panel was drawn for completeness and is pending at time of dictation.  No intervention is needed at this time.  Patient does not require bone marrow biopsy.  After lengthy discussion with patient, is agreed upon that no further follow-up is necessary.  Continue to monitor CBC 1-2 times per year and refer patient back if there are any questions or concerns.  Elevated MCV: Mild.  Folate and B12 levels are within normal limits. MGUS: Patient noted to have an M spike of 0.2.  Likely clinically insignificant.  Cardiac hx, recently BP was not well controlled however that improved with increasing bp med dose to 80-12.5 (telmisartan-hydrochlorothiazide) with Dr. Carlynn Purl just checked about 3 weeks ago in office No recent cardiac testing ECG from 10 years ago, no ECHO in our system - reviewed records through care everywhere and pt has previously seen cardiology at [facility]  Last OV reviewed 12/08/2022:  HPI  - He has lost 8 pounds since his last office visit. He underwent stress echocardiogram 08/18/2017, exercised 6 minutes on a Bruce protocol  without chest pain or diagnostic ECG changes. At baseline 2D echocardiogram revealed normal left ventricular function, with LVEF greater than 55%. At peak exercise was appropriate augmentation of all myocardial segments, without evidence for ischemia. Previous 2D echocardiogram on 07/27/2016 revealed normal left ventricular function with an LVEF >55% with grade 1 diastolic dysfunction, mild mitral regurgitation and mild tricuspid regurgitation. The right ventricle and the right atrium were moderately enlarged without evidence of pulmonary hypertension. 72-hour Holter monitor 05/14/2022 - 05/17/2022 revealed predominant sinus  rhythm with mean heart rate of 82 bpm, sinus heart rate range 48 to 111 bpm, frequent premature atrial contractions and premature ventricular contractions, and frequent brief atrial runs the longest lasting 18 beats. There were no significant episodes of atrial fibrillation.  A&P: Assessment   87 y.o. male with  1. Hypertension associated with diabetes (CMS/HHS-HCC)  2. Chest pain with high risk for cardiac etiology  3. Atherosclerosis of aorta (CMS-HCC)  4. Heart palpitations  5. SOB (shortness of breath) on exertion  6. Hyperlipidemia associated with type 2 diabetes mellitus , unspecified (CMS-HCC)  7. Type 2 diabetes mellitus with diabetic polyneuropathy, with long-term current use of insulin (CMS/HHS-HCC)   87 year old male returns with episode of exertional chest pain without recurrence. Stress echocardiogram revealed normal left ventricular function, without evidence for scar or ischemia. The patient has essential hypertension, blood pressure well controlled on current BP medications.  Plan   1. Continue current medications. 2. Counseled patient about low sodium diet. 3. DASH diet printed instructions given to the patient. 4. Patient about low-cholesterol diet 5. Continue rosuvastatin for hyperlipidemia management 6. Low-fat and cholesterol diet printed instructions given to the patient 7. Advised patient to continue to lose weight 8. Return to clinic for follow-up in 6 months      Patient Active Problem List   Diagnosis Date Noted   Thrombocytopenia (HCC) 06/03/2022   B12 deficiency 06/03/2022   Basal cell carcinoma (BCC) of skin of face 11/03/2021   Dyslipidemia associated with type 2 diabetes mellitus (HCC) 11/03/2021   Impingement syndrome of shoulder region 11/16/2018   Atherosclerosis of aorta (HCC) 07/21/2018   History of diverticulitis of colon 11/15/2017   Hyperlipidemia due to type 2 diabetes mellitus (HCC) 03/14/2017   Bilateral lower extremity edema 03/27/2016    Vitamin D deficiency 12/05/2015   Hx of transient ischemic attack (TIA) 09/17/2015   Diabetes mellitus type 2 without retinopathy (HCC) 05/31/2015   Hyperlipidemia 05/30/2015   Type 2 diabetes mellitus with renal manifestations (HCC) 01/08/2015   Incisional hernia, without obstruction or gangrene 03/23/2013   Enlarged prostate with lower urinary tract symptoms (LUTS) 11/03/2012   Myopic degeneration, bilateral 08/12/2012   Nonexudative age-related macular degeneration 07/15/2012   Anisometropia 04/08/2012   Pseudoaphakia 04/08/2012   History of retinal detachment 04/08/2012   Presence of intraocular lens 04/08/2012   Secondary open-angle glaucoma of left eye, severe stage 01/13/2012   Benign essential HTN 02/03/2007   Hypertension associated with diabetes (HCC) 02/03/2007      Current Outpatient Medications:    bisoprolol (ZEBETA) 10 MG tablet, Take 1 tablet (10 mg total) by mouth daily., Disp: 90 tablet, Rfl: 1   brimonidine (ALPHAGAN P) 0.1 % SOLN, Place 1 drop into the left eye 2 (two) times daily. , Disp: , Rfl:    desonide (DESOWEN) 0.05 % ointment, Apply to rash under eye QD-BID PRN, Disp: 15 g, Rfl: 1   dorzolamide (TRUSOPT) 2 % ophthalmic solution, Place 1 drop into the left eye 2 (two)  times daily., Disp: , Rfl: 3   empagliflozin (JARDIANCE) 25 MG TABS tablet, Take by mouth daily., Disp: , Rfl:    Exenatide ER (BYDUREON BCISE) 2 MG/0.85ML AUIJ, Inject 2 mg into the skin once a week., Disp: , Rfl:    finasteride (PROSCAR) 5 MG tablet, Take 1 tablet by mouth once daily, Disp: 90 tablet, Rfl: 1   glipiZIDE (GLUCOTROL) 10 MG tablet, Take 10 mg by mouth daily., Disp: , Rfl:    hydrocortisone 2.5 % cream, APPLY TO THE AFFECTED AREA OF FACE AND EARS EVERY OTHER NIGHT ALTERNATING WITH KETOCONAZOLE CREAM, Disp: 28 g, Rfl: 0   insulin degludec (TRESIBA FLEXTOUCH) 200 UNIT/ML FlexTouch Pen, Inject 45 Units into the skin in the morning., Disp: , Rfl:    ketoconazole (NIZORAL) 2 %  cream, Apply to penis twice daily, Disp: 60 g, Rfl: 3   ketoconazole (NIZORAL) 2 % shampoo, Apply 1 Application topically 3 (three) times a week. Shampoo scalp 3 days a week let sit 5 minutes and rinse off, Disp: 120 mL, Rfl: 3   metFORMIN (GLUCOPHAGE) 1000 MG tablet, Take 1,000 mg by mouth 2 (two) times daily with a meal., Disp: , Rfl:    mometasone (ELOCON) 0.1 % cream, Apply topically daily., Disp: 45 g, Rfl: 0   mometasone (ELOCON) 0.1 % lotion, APPLY TO THE AFFECTED AREAS OF SCALP THREE NIGHTS PER WEEK, Disp: 60 mL, Rfl: 0   omega-3 acid ethyl esters (LOVAZA) 1 g capsule, Take 2 capsules (2 g total) by mouth 2 (two) times daily., Disp: 480 capsule, Rfl: 3   omeprazole (PRILOSEC) 20 MG capsule, Take 1 capsule (20 mg total) by mouth daily., Disp: 90 capsule, Rfl: 0   rosuvastatin (CRESTOR) 10 MG tablet, Take 1 tablet (10 mg total) by mouth at bedtime., Disp: 90 tablet, Rfl: 1   Semaglutide,0.25 or 0.5MG /DOS, (OZEMPIC, 0.25 OR 0.5 MG/DOSE,) 2 MG/1.5ML SOPN, Inject into the skin., Disp: , Rfl:    tadalafil (CIALIS) 20 MG tablet, TAKE 1/2 TO 1 (ONE-HALF TO ONE) TABLET BY MOUTH EVERY OTHER DAY AS NEEDED FOR ERECTILE DYSFUNCTION, Disp: 30 tablet, Rfl: 0   telmisartan-hydrochlorothiazide (MICARDIS HCT) 80-12.5 MG tablet, Take 1 tablet by mouth daily., Disp: 90 tablet, Rfl: 0   timolol (TIMOPTIC) 0.5 % ophthalmic solution, INSTILL 1 DROP INTO LEFT EYE TWICE DAILY, Disp: , Rfl:    traMADol (ULTRAM) 50 MG tablet, Take 1 tablet (50 mg total) by mouth 2 (two) times daily., Disp: 60 tablet, Rfl: 0   Allergies  Allergen Reactions   Demerol [Meperidine] Other (See Comments) and Anaphylaxis    MAKES BLOOD PRESSURE BOTTOM OUT hypotension   Dilaudid [Hydromorphone Hcl]    Hydromorphone     Other reaction(s): Unknown MAKES BLOOD PRESSURE BOTTOM OUT   Meperidine Hcl    Actos [Pioglitazone] Other (See Comments) and Hives   Dapagliflozin Rash   Penicillins Rash     Social History   Tobacco Use    Smoking status: Never   Smokeless tobacco: Never   Tobacco comments:    smoking cessation materials not required  Vaping Use   Vaping status: Never Used  Substance Use Topics   Alcohol use: No   Drug use: No      Chart Review Today: I personally reviewed active problem list, medication list, allergies, family history, social history, health maintenance, notes from last encounter, lab results, imaging with the patient/caregiver today.   Review of Systems  Constitutional: Negative.   HENT: Negative.    Eyes:  Negative.   Respiratory: Negative.  Negative for cough, chest tightness and wheezing.   Cardiovascular: Negative.  Negative for chest pain, palpitations and leg swelling.  Gastrointestinal: Negative.   Endocrine: Negative.   Genitourinary: Negative.   Musculoskeletal: Negative.   Skin: Negative.   Allergic/Immunologic: Negative.   Neurological: Negative.   Hematological: Negative.   Psychiatric/Behavioral: Negative.    All other systems reviewed and are negative.      Objective:   Vitals:   01/25/23 1144  BP: 126/74  Pulse: 83  Resp: 16  Temp: 98.1 F (36.7 C)  TempSrc: Oral  SpO2: 95%  Weight: 206 lb 1.6 oz (93.5 kg)  Height: 6' 3.5" (1.918 m)    Body mass index is 25.42 kg/m.  Physical Exam Vitals and nursing note reviewed.  Constitutional:      General: He is not in acute distress.    Appearance: He is well-developed. He is obese. He is not ill-appearing, toxic-appearing or diaphoretic.     Comments: Obese, elderly, NAD, non-toxic appearing  HENT:     Head: Normocephalic and atraumatic.     Nose: Nose normal.  Eyes:     General:        Right eye: No discharge.        Left eye: No discharge.     Conjunctiva/sclera: Conjunctivae normal.  Neck:     Trachea: No tracheal deviation.  Cardiovascular:     Rate and Rhythm: Normal rate and regular rhythm. Frequent Extrasystoles are present.    Chest Wall: PMI is not displaced.     Pulses:           Radial pulses are 2+ on the right side and 2+ on the left side.     Heart sounds: Heart sounds not distant.     No friction rub.  Pulmonary:     Effort: Pulmonary effort is normal. No respiratory distress.     Breath sounds: Normal breath sounds. No stridor. No wheezing, rhonchi or rales.  Musculoskeletal:     Right lower leg: 2+ Edema present.     Left lower leg: 2+ Edema present.  Skin:    General: Skin is warm and dry.     Findings: No rash.  Neurological:     Mental Status: He is alert.     Motor: No abnormal muscle tone.     Coordination: Coordination normal.  Psychiatric:        Behavior: Behavior normal.      Results for orders placed or performed in visit on 12/22/22  CBC with Differential  Result Value Ref Range   WBC 4.2 4.0 - 10.5 K/uL   RBC 3.72 (L) 4.22 - 5.81 MIL/uL   Hemoglobin 13.1 13.0 - 17.0 g/dL   HCT 16.1 09.6 - 04.5 %   MCV 108.3 (H) 80.0 - 100.0 fL   MCH 35.2 (H) 26.0 - 34.0 pg   MCHC 32.5 30.0 - 36.0 g/dL   RDW 40.9 81.1 - 91.4 %   Platelets 112 (L) 150 - 400 K/uL   nRBC 0.0 0.0 - 0.2 %   Neutrophils Relative % 51 %   Neutro Abs 2.2 1.7 - 7.7 K/uL   Lymphocytes Relative 29 %   Lymphs Abs 1.2 0.7 - 4.0 K/uL   Monocytes Relative 16 %   Monocytes Absolute 0.7 0.1 - 1.0 K/uL   Eosinophils Relative 2 %   Eosinophils Absolute 0.1 0.0 - 0.5 K/uL   Basophils Relative 1 %   Basophils  Absolute 0.0 0.0 - 0.1 K/uL   Immature Granulocytes 1 %   Abs Immature Granulocytes 0.03 0.00 - 0.07 K/uL  IntelliGEN Myeloid  Result Value Ref Range   Specimen Type Whole Blood    Clinical Indication Comment    RESULT SUMMARY See below: (A)    INTERPRETATION Comment    METHODOLOGY Comment    REFERENCES Comment    DIRECTOR REVIEW Comment        Assessment & Plan:   1. Dyspnea, unspecified type Only when waking up and getting out of bed first thing in am, no other associated sx.  Has been happening for roughly a month (after his hem/onc appt on 7/9 but before his  last PCP f/up on 7/24) He is most concerned with his heart rhythm and checking for anemia Examining his pulses he does seem to have probably some premature beats, auscultation of his heart also sounded like some premature beats or possibly a gallop or some respiratory variation but overall rhythm felt regular with occasional premature beats. Patient is on a beta-blocker -he could have bradycardia first thing in the morning or when sleeping and waking up did encourage him to check his pulse ECHO or holter monitor may be helpful - he does see cardiology and he will ask them about it this afternoon EKG done here today there is a lack of discernible P waves but QRS and ST complexes appear fairly regular with a rate of 79 - CBC with Differential/Platelet - Brain natriuretic peptide - EKG 12-Lead  2. Drop in hemoglobin Patient is concerned about dropping hemoglobin because of taking NSAIDs his last hemoglobin was 13.1 which I explained to him is in the normal range, he has not had any abdominal pain indigestion or blood in stool he refused Hemoccult cards today I did explain to him the purpose with rechecking a hemoglobin and checking Hemoccult that it would reassure [place] that there is no hidden blood in stool or GI blood loss - CBC with Differential/Platelet  3. Chronic superficial gastritis without bleeding His PCP stopped NSAIDs and gave him tramadol for chronic pain management  4. Benign essential HTN Well-controlled today and at goal  EKG done a copy was given to him to take to his cardiologist He will return to clinic for labs this afternoon  I did offer to order the Holter monitor if he would like but I encouraged him first to check with his cardiologist to order since they just did one in about December last year for continuity of care.  No afib on todays ECG.    Danelle Berry, PA-C 01/25/23 11:55 AM

## 2023-01-26 ENCOUNTER — Encounter: Payer: Self-pay | Admitting: Family Medicine

## 2023-01-27 ENCOUNTER — Other Ambulatory Visit: Payer: Self-pay | Admitting: Family Medicine

## 2023-01-27 DIAGNOSIS — D649 Anemia, unspecified: Secondary | ICD-10-CM

## 2023-01-27 NOTE — Telephone Encounter (Signed)
Copied from CRM [id number]. Topic: General - Inquiry >> Jan 27, 2023  9:43 AM Patsy Lager T wrote: Reason for CRM: patient called stated his heart rate is slow in the mornings. It drops down into the 50's. He thinks its his beta blocker and wants to see if his medication needs to be changed. Patient wants to speak with Dr Carlynn Purl and request a call back on his cell

## 2023-02-01 DIAGNOSIS — R0602 Shortness of breath: Secondary | ICD-10-CM | POA: Diagnosis not present

## 2023-02-01 DIAGNOSIS — E1159 Type 2 diabetes mellitus with other circulatory complications: Secondary | ICD-10-CM | POA: Diagnosis not present

## 2023-02-01 DIAGNOSIS — Z794 Long term (current) use of insulin: Secondary | ICD-10-CM | POA: Diagnosis not present

## 2023-02-01 DIAGNOSIS — I152 Hypertension secondary to endocrine disorders: Secondary | ICD-10-CM | POA: Diagnosis not present

## 2023-02-01 DIAGNOSIS — E1142 Type 2 diabetes mellitus with diabetic polyneuropathy: Secondary | ICD-10-CM | POA: Diagnosis not present

## 2023-02-01 DIAGNOSIS — E785 Hyperlipidemia, unspecified: Secondary | ICD-10-CM | POA: Diagnosis not present

## 2023-02-01 DIAGNOSIS — E1169 Type 2 diabetes mellitus with other specified complication: Secondary | ICD-10-CM | POA: Diagnosis not present

## 2023-02-01 DIAGNOSIS — I4891 Unspecified atrial fibrillation: Secondary | ICD-10-CM | POA: Diagnosis not present

## 2023-02-04 ENCOUNTER — Other Ambulatory Visit: Payer: Self-pay | Admitting: Family Medicine

## 2023-02-04 DIAGNOSIS — N529 Male erectile dysfunction, unspecified: Secondary | ICD-10-CM

## 2023-02-04 DIAGNOSIS — N400 Enlarged prostate without lower urinary tract symptoms: Secondary | ICD-10-CM

## 2023-02-06 DIAGNOSIS — Z23 Encounter for immunization: Secondary | ICD-10-CM | POA: Diagnosis not present

## 2023-02-10 DIAGNOSIS — Z794 Long term (current) use of insulin: Secondary | ICD-10-CM | POA: Diagnosis not present

## 2023-02-10 DIAGNOSIS — E1169 Type 2 diabetes mellitus with other specified complication: Secondary | ICD-10-CM | POA: Diagnosis not present

## 2023-02-10 DIAGNOSIS — E1142 Type 2 diabetes mellitus with diabetic polyneuropathy: Secondary | ICD-10-CM | POA: Diagnosis not present

## 2023-02-10 DIAGNOSIS — E785 Hyperlipidemia, unspecified: Secondary | ICD-10-CM | POA: Diagnosis not present

## 2023-02-10 DIAGNOSIS — E1159 Type 2 diabetes mellitus with other circulatory complications: Secondary | ICD-10-CM | POA: Diagnosis not present

## 2023-02-10 DIAGNOSIS — I152 Hypertension secondary to endocrine disorders: Secondary | ICD-10-CM | POA: Diagnosis not present

## 2023-02-10 LAB — HEMOGLOBIN A1C: Hemoglobin A1C: 5.8

## 2023-02-11 DIAGNOSIS — R0602 Shortness of breath: Secondary | ICD-10-CM | POA: Diagnosis not present

## 2023-02-12 DIAGNOSIS — Z23 Encounter for immunization: Secondary | ICD-10-CM | POA: Diagnosis not present

## 2023-02-16 DIAGNOSIS — I4819 Other persistent atrial fibrillation: Secondary | ICD-10-CM | POA: Diagnosis not present

## 2023-02-17 ENCOUNTER — Ambulatory Visit: Payer: Medicare Other

## 2023-02-22 DIAGNOSIS — D649 Anemia, unspecified: Secondary | ICD-10-CM | POA: Diagnosis not present

## 2023-02-23 ENCOUNTER — Other Ambulatory Visit: Payer: Self-pay | Admitting: Family Medicine

## 2023-02-23 DIAGNOSIS — I152 Hypertension secondary to endocrine disorders: Secondary | ICD-10-CM | POA: Diagnosis not present

## 2023-02-23 DIAGNOSIS — E1169 Type 2 diabetes mellitus with other specified complication: Secondary | ICD-10-CM | POA: Diagnosis not present

## 2023-02-23 DIAGNOSIS — R0602 Shortness of breath: Secondary | ICD-10-CM | POA: Diagnosis not present

## 2023-02-23 DIAGNOSIS — I4819 Other persistent atrial fibrillation: Secondary | ICD-10-CM | POA: Diagnosis not present

## 2023-02-23 DIAGNOSIS — E785 Hyperlipidemia, unspecified: Secondary | ICD-10-CM | POA: Diagnosis not present

## 2023-02-23 DIAGNOSIS — Z794 Long term (current) use of insulin: Secondary | ICD-10-CM | POA: Diagnosis not present

## 2023-02-23 DIAGNOSIS — I700 Atherosclerosis of aorta: Secondary | ICD-10-CM | POA: Diagnosis not present

## 2023-02-23 DIAGNOSIS — E1159 Type 2 diabetes mellitus with other circulatory complications: Secondary | ICD-10-CM | POA: Diagnosis not present

## 2023-02-23 DIAGNOSIS — E1142 Type 2 diabetes mellitus with diabetic polyneuropathy: Secondary | ICD-10-CM | POA: Diagnosis not present

## 2023-02-23 LAB — CBC WITH DIFFERENTIAL/PLATELET
Absolute Monocytes: 518 {cells}/uL (ref 200–950)
Basophils Absolute: 11 {cells}/uL (ref 0–200)
Basophils Relative: 0.3 %
Eosinophils Absolute: 59 {cells}/uL (ref 15–500)
Eosinophils Relative: 1.6 %
HCT: 39.9 % (ref 38.5–50.0)
Hemoglobin: 13.5 g/dL (ref 13.2–17.1)
Lymphs Abs: 1117 {cells}/uL (ref 850–3900)
MCH: 33.8 pg — ABNORMAL HIGH (ref 27.0–33.0)
MCHC: 33.8 g/dL (ref 32.0–36.0)
MCV: 100 fL (ref 80.0–100.0)
MPV: 11.3 fL (ref 7.5–12.5)
Monocytes Relative: 14 %
Neutro Abs: 1994 {cells}/uL (ref 1500–7800)
Neutrophils Relative %: 53.9 %
Platelets: 119 10*3/uL — ABNORMAL LOW (ref 140–400)
RBC: 3.99 10*6/uL — ABNORMAL LOW (ref 4.20–5.80)
RDW: 12.5 % (ref 11.0–15.0)
Total Lymphocyte: 30.2 %
WBC: 3.7 10*3/uL — ABNORMAL LOW (ref 3.8–10.8)

## 2023-02-23 LAB — B12 AND FOLATE PANEL
Folate: >24 ng/mL
Vitamin B-12: 1161 pg/mL — ABNORMAL HIGH (ref 200–1100)

## 2023-02-23 LAB — IRON,TIBC AND FERRITIN PANEL
%SAT: 32 % (ref 20–48)
Ferritin: 55 ng/mL (ref 24–380)
Iron: 93 ug/dL (ref 50–180)
TIBC: 290 ug/dL (ref 250–425)

## 2023-02-23 MED ORDER — SODIUM CHLORIDE 0.9 % IV SOLN: INTRAVENOUS | Status: DC

## 2023-02-24 ENCOUNTER — Ambulatory Visit: Payer: Medicare Other | Admitting: Certified Registered Nurse Anesthetist

## 2023-02-24 ENCOUNTER — Ambulatory Visit
Admission: RE | Admit: 2023-02-24 | Discharge: 2023-02-24 | Disposition: A | Discharge status: Home / Self Care | Payer: Medicare Other | Attending: Cardiology | Admitting: Cardiology

## 2023-02-24 ENCOUNTER — Encounter
Admission: RE | Disposition: A | Discharge status: Home / Self Care | Payer: Self-pay | Source: Home / Self Care | Attending: Cardiology

## 2023-02-24 ENCOUNTER — Other Ambulatory Visit: Payer: Self-pay

## 2023-02-24 DIAGNOSIS — I129 Hypertensive chronic kidney disease with stage 1 through stage 4 chronic kidney disease, or unspecified chronic kidney disease: Secondary | ICD-10-CM | POA: Diagnosis not present

## 2023-02-24 DIAGNOSIS — I4891 Unspecified atrial fibrillation: Secondary | ICD-10-CM | POA: Diagnosis not present

## 2023-02-24 DIAGNOSIS — N189 Chronic kidney disease, unspecified: Secondary | ICD-10-CM | POA: Diagnosis not present

## 2023-02-24 DIAGNOSIS — E1122 Type 2 diabetes mellitus with diabetic chronic kidney disease: Secondary | ICD-10-CM | POA: Diagnosis not present

## 2023-02-24 HISTORY — PX: CARDIOVERSION: SHX1299

## 2023-02-24 LAB — GLUCOSE, CAPILLARY: Glucose-Capillary: 85 mg/dL (ref 70–99)

## 2023-02-24 SURGERY — CARDIOVERSION
Anesthesia: General

## 2023-02-24 MED ORDER — OXYCODONE HCL 5 MG PO TABS: 5.0000 mg | ORAL_TABLET | Freq: Once | ORAL | Status: DC | PRN

## 2023-02-24 MED ORDER — PROPOFOL 10 MG/ML IV BOLUS
INTRAVENOUS | Status: DC | PRN
  Administered 2023-02-24: 50 mg via INTRAVENOUS

## 2023-02-24 MED ORDER — EPHEDRINE SULFATE (PRESSORS) 50 MG/ML IJ SOLN: INTRAMUSCULAR | Status: DC | PRN

## 2023-02-24 MED ORDER — ACETAMINOPHEN 10 MG/ML IV SOLN: 1000.0000 mg | Freq: Once | INTRAVENOUS | Status: DC | PRN

## 2023-02-24 MED ORDER — FENTANYL CITRATE (PF) 100 MCG/2ML IJ SOLN: 25.0000 ug | INTRAMUSCULAR | Status: DC | PRN

## 2023-02-24 MED ORDER — PROMETHAZINE HCL 25 MG/ML IJ SOLN: 6.2500 mg | INTRAMUSCULAR | Status: DC | PRN

## 2023-02-24 MED ORDER — OXYCODONE HCL 5 MG/5ML PO SOLN: 5.0000 mg | Freq: Once | ORAL | Status: DC | PRN

## 2023-02-24 MED ORDER — DROPERIDOL 2.5 MG/ML IJ SOLN: 0.6250 mg | Freq: Once | INTRAMUSCULAR | Status: DC | PRN

## 2023-02-24 NOTE — Op Note (Signed)
[facility] Cardiology   02/24/2023                     7:49 AM  PATIENT:  Jeremy [name]    PRE-OPERATIVE DIAGNOSIS:  Cardioversion   AFib  Okd Dr Pernell Dupre  POST-OPERATIVE DIAGNOSIS:  Same  PROCEDURE:  CARDIOVERSION  SURGEON:  Marcina Millard, MD    ANESTHESIA:     PREOPERATIVE INDICATIONS:  Jeremy [name] is a  87 y.o. male with a diagnosis of Cardioversion   AFib  Okd Dr Pernell Dupre who failed conservative measures and elected for surgical management.    The risks benefits and alternatives were discussed with the patient preoperatively including but not limited to the risks of infection, bleeding, cardiopulmonary complications, the need for revision surgery, among others, and the patient was willing to proceed.   OPERATIVE PROCEDURE: The patient presented to the special procedures holding area in a fasting state.  Preprocedural ECG revealed atrial fibrillation.  Patient received 50 mg of propofol.  Direct-current cardioversion was performed with 75 J with successful conversion to sinus rhythm at 83 bpm.  There were no periprocedural complications.

## 2023-02-24 NOTE — Discharge Instructions (Signed)
Call Dr Darrold Junker office and tell them you need to schedule a post cardiversion follow up appointment for Monday or Tuesday

## 2023-02-24 NOTE — Anesthesia Preprocedure Evaluation (Signed)
Anesthesia Evaluation  Patient identified by MRN, date of birth, ID band Patient awake    Reviewed: Allergy & Precautions, H&P , NPO status , Patient's Chart, lab work & pertinent test results, reviewed documented beta blocker date and time   Airway Mallampati: II   Neck ROM: full    Dental  (+) Poor Dentition   Pulmonary neg pulmonary ROS   Pulmonary exam normal        Cardiovascular Exercise Tolerance: Good hypertension, On Medications negative cardio ROS Normal cardiovascular exam Rhythm:regular Rate:Normal     Neuro/Psych Seizures -,   negative psych ROS   GI/Hepatic Neg liver ROS, hiatal hernia,GERD  Medicated,,  Endo/Other  negative endocrine ROSdiabetes, Well Controlled    Renal/GU Renal disease  negative genitourinary   Musculoskeletal   Abdominal   Peds  Hematology negative hematology ROS (+)   Anesthesia Other Findings Past Medical History: No date: Allergy 10/09/2021: Basal cell carcinoma     Comment:  right medial forehead at glabella, Mohs 01/22/22 No date: Chronic kidney disease No date: Diabetes mellitus without complication (HCC) No date: Diabetic neuropathy (HCC) 1980's: Diverticula, colon No date: Diverticulosis No date: Enlarged prostate No date: GERD (gastroesophageal reflux disease) No date: Glaucoma No date: Hemorrhoids No date: High cholesterol No date: History of hiatal hernia No date: Hyperlipidemia No date: Hypertension No date: Obesity No date: Onychomycosis No date: Seizures (HCC) 1990's: Small bowel obstruction (HCC) No date: Urination disorder Past Surgical History: No date: APPENDECTOMY 2013: BLADDER DIVERTICULECTOMY 01/2022: CARPAL TUNNEL RELEASE     Comment:  revision right side 1980's: COLON SURGERY; Left 1990's: COLONOSCOPY W/ POLYPECTOMY     Comment:  Dr Okey Dupre 12/21/2014: COLONOSCOPY WITH PROPOFOL; N/A     Comment:  Procedure: COLONOSCOPY WITH PROPOFOL;   Surgeon: Christena Deem, MD;  Location: [facility] ENDOSCOPY;  Service:               Endoscopy;  Laterality: N/A; 08/16/2017: COLONOSCOPY WITH PROPOFOL; N/A     Comment:  Procedure: COLONOSCOPY WITH PROPOFOL;  Surgeon:               Christena Deem, MD;  Location: [facility] ENDOSCOPY;                Service: Endoscopy;  Laterality: N/A; 01/31/2021: COLONOSCOPY WITH PROPOFOL; N/A     Comment:  Procedure: COLONOSCOPY WITH PROPOFOL;  Surgeon: Earline Mayotte, MD;  Location: [facility] ENDOSCOPY;  Service:               Endoscopy;  Laterality: N/A; 02/15/2015: ESOPHAGOGASTRODUODENOSCOPY (EGD) WITH PROPOFOL; N/A     Comment:  Procedure: ESOPHAGOGASTRODUODENOSCOPY (EGD) WITH               PROPOFOL;  Surgeon: Christena Deem, MD;  Location:               [facility] ENDOSCOPY;  Service: Endoscopy;  Laterality: N/A; 06/10/2017: ESOPHAGOGASTRODUODENOSCOPY (EGD) WITH PROPOFOL; N/A     Comment:  Procedure: ESOPHAGOGASTRODUODENOSCOPY (EGD) WITH               PROPOFOL;  Surgeon: Christena Deem, MD;  Location:               [facility] ENDOSCOPY;  Service: Endoscopy;  Laterality: N/A; 08/16/2017: ESOPHAGOGASTRODUODENOSCOPY (EGD) WITH PROPOFOL; N/A     Comment:  Procedure: ESOPHAGOGASTRODUODENOSCOPY (EGD) WITH  PROPOFOL;  Surgeon: Christena Deem, MD;  Location:               [facility] ENDOSCOPY;  Service: Endoscopy;  Laterality: N/A; 01/31/2021: ESOPHAGOGASTRODUODENOSCOPY (EGD) WITH PROPOFOL; N/A     Comment:  Procedure: ESOPHAGOGASTRODUODENOSCOPY (EGD) WITH               PROPOFOL;  Surgeon: Earline Mayotte, MD;  Location:               [facility] ENDOSCOPY;  Service: Endoscopy;  Laterality: N/A; No date: EYE SURGERY 1990's: GLAUCOMA SURGERY; Left     Comment:  [facility] 1980's: HEMORROIDECTOMY     Comment:  [facility] No date: HERNIA REPAIR     Comment:  [facility], Dr Okey Dupre 1960's: INGUINAL HERNIA REPAIR; Bilateral 1990: LARYNX SURGERY     Comment:  3 times 01/22/2022:  MOHS SURGERY     Comment:  Dr. Verdell Face [facility] No date: RETINAL DETACHMENT SURGERY; Right No date: TONSILLECTOMY 2013: turp 2013: URETER SURGERY     Comment:  Dr Achilles Dunk   Reproductive/Obstetrics negative OB ROS                             Anesthesia Physical Anesthesia Plan  ASA: 4  Anesthesia Plan: General   Post-op Pain Management:    Induction:   PONV Risk Score and Plan:   Airway Management Planned:   Additional Equipment:   Intra-op Plan:   Post-operative Plan:   Informed Consent: I have reviewed the patients History and Physical, chart, labs and discussed the procedure including the risks, benefits and alternatives for the proposed anesthesia with the patient or authorized representative who has indicated his/her understanding and acceptance.     Dental Advisory Given  Plan Discussed with: CRNA  Anesthesia Plan Comments:        Anesthesia Quick Evaluation

## 2023-02-24 NOTE — Transfer of Care (Signed)
Immediate Anesthesia Transfer of Care Note  Patient: Jeremy [name]  Procedure(s) Performed: CARDIOVERSION  Patient Location: Cath Lab  Anesthesia Type:General  Level of Consciousness: awake, alert , and oriented  Airway & Oxygen Therapy: Patient Spontanous Breathing and Patient connected to nasal cannula oxygen  Post-op Assessment: Report given to RN and Post -op Vital signs reviewed and stable  Post vital signs: Reviewed and stable  Last Vitals:  Vitals Value Taken Time  BP 117/72 02/24/23 0745  Temp    Pulse 83 02/24/23 0748  Resp 19 02/24/23 0748  SpO2 96 % 02/24/23 0748    Last Pain:  Vitals:   02/24/23 0732  PainSc: 0-No pain         Complications: No notable events documented.

## 2023-02-25 ENCOUNTER — Encounter: Payer: Self-pay | Admitting: Cardiology

## 2023-02-25 NOTE — Anesthesia Postprocedure Evaluation (Signed)
Anesthesia Post Note  Patient: Jeremy [name]  Procedure(s) Performed: CARDIOVERSION  Patient location during evaluation: PACU Anesthesia Type: General Level of consciousness: awake and alert Pain management: pain level controlled Vital Signs Assessment: post-procedure vital signs reviewed and stable Respiratory status: spontaneous breathing, nonlabored ventilation, respiratory function stable and patient connected to nasal cannula oxygen Cardiovascular status: blood pressure returned to baseline and stable Postop Assessment: no apparent nausea or vomiting Anesthetic complications: no   No notable events documented.   Last Vitals:  Vitals:   02/24/23 0815 02/24/23 0818  BP: 108/64 113/74  Pulse: 78 81  Resp: 18 20  SpO2: 93% 95%    Last Pain:  Vitals:   02/24/23 0818  PainSc: 0-No pain                 Yevette Edwards

## 2023-03-02 DIAGNOSIS — I4891 Unspecified atrial fibrillation: Secondary | ICD-10-CM | POA: Diagnosis not present

## 2023-03-02 DIAGNOSIS — I152 Hypertension secondary to endocrine disorders: Secondary | ICD-10-CM | POA: Diagnosis not present

## 2023-03-02 DIAGNOSIS — Z23 Encounter for immunization: Secondary | ICD-10-CM | POA: Diagnosis not present

## 2023-03-02 DIAGNOSIS — I480 Paroxysmal atrial fibrillation: Secondary | ICD-10-CM | POA: Diagnosis not present

## 2023-03-02 DIAGNOSIS — R002 Palpitations: Secondary | ICD-10-CM | POA: Diagnosis not present

## 2023-03-02 DIAGNOSIS — E1159 Type 2 diabetes mellitus with other circulatory complications: Secondary | ICD-10-CM | POA: Diagnosis not present

## 2023-03-04 DIAGNOSIS — R278 Other lack of coordination: Secondary | ICD-10-CM | POA: Diagnosis not present

## 2023-03-04 DIAGNOSIS — Z79899 Other long term (current) drug therapy: Secondary | ICD-10-CM | POA: Diagnosis not present

## 2023-03-04 DIAGNOSIS — Z471 Aftercare following joint replacement surgery: Secondary | ICD-10-CM | POA: Diagnosis not present

## 2023-03-04 DIAGNOSIS — I4891 Unspecified atrial fibrillation: Secondary | ICD-10-CM | POA: Diagnosis not present

## 2023-03-04 DIAGNOSIS — M1711 Unilateral primary osteoarthritis, right knee: Secondary | ICD-10-CM | POA: Diagnosis present

## 2023-03-04 DIAGNOSIS — N400 Enlarged prostate without lower urinary tract symptoms: Secondary | ICD-10-CM | POA: Diagnosis not present

## 2023-03-04 DIAGNOSIS — M6281 Muscle weakness (generalized): Secondary | ICD-10-CM | POA: Diagnosis not present

## 2023-03-04 DIAGNOSIS — Z880 Allergy status to penicillin: Secondary | ICD-10-CM | POA: Diagnosis not present

## 2023-03-04 DIAGNOSIS — Z9889 Other specified postprocedural states: Secondary | ICD-10-CM | POA: Diagnosis not present

## 2023-03-04 DIAGNOSIS — E1122 Type 2 diabetes mellitus with diabetic chronic kidney disease: Secondary | ICD-10-CM | POA: Diagnosis not present

## 2023-03-04 DIAGNOSIS — E785 Hyperlipidemia, unspecified: Secondary | ICD-10-CM | POA: Diagnosis not present

## 2023-03-04 DIAGNOSIS — E1140 Type 2 diabetes mellitus with diabetic neuropathy, unspecified: Secondary | ICD-10-CM | POA: Diagnosis not present

## 2023-03-04 DIAGNOSIS — R2681 Unsteadiness on feet: Secondary | ICD-10-CM | POA: Diagnosis not present

## 2023-03-04 DIAGNOSIS — I10 Essential (primary) hypertension: Secondary | ICD-10-CM | POA: Diagnosis not present

## 2023-03-04 DIAGNOSIS — R2689 Other abnormalities of gait and mobility: Secondary | ICD-10-CM | POA: Diagnosis not present

## 2023-03-04 DIAGNOSIS — Z9849 Cataract extraction status, unspecified eye: Secondary | ICD-10-CM | POA: Diagnosis not present

## 2023-03-04 DIAGNOSIS — Z8673 Personal history of transient ischemic attack (TIA), and cerebral infarction without residual deficits: Secondary | ICD-10-CM | POA: Diagnosis not present

## 2023-03-04 DIAGNOSIS — Z7901 Long term (current) use of anticoagulants: Secondary | ICD-10-CM | POA: Diagnosis not present

## 2023-03-04 DIAGNOSIS — F5102 Adjustment insomnia: Secondary | ICD-10-CM | POA: Diagnosis not present

## 2023-03-04 DIAGNOSIS — I480 Paroxysmal atrial fibrillation: Secondary | ICD-10-CM | POA: Diagnosis not present

## 2023-03-04 DIAGNOSIS — Z96651 Presence of right artificial knee joint: Secondary | ICD-10-CM | POA: Diagnosis not present

## 2023-03-04 DIAGNOSIS — Z885 Allergy status to narcotic agent status: Secondary | ICD-10-CM | POA: Diagnosis not present

## 2023-03-04 DIAGNOSIS — E782 Mixed hyperlipidemia: Secondary | ICD-10-CM | POA: Diagnosis not present

## 2023-03-04 DIAGNOSIS — E1139 Type 2 diabetes mellitus with other diabetic ophthalmic complication: Secondary | ICD-10-CM | POA: Diagnosis not present

## 2023-03-04 DIAGNOSIS — H409 Unspecified glaucoma: Secondary | ICD-10-CM | POA: Diagnosis not present

## 2023-03-04 DIAGNOSIS — K219 Gastro-esophageal reflux disease without esophagitis: Secondary | ICD-10-CM | POA: Diagnosis not present

## 2023-03-04 DIAGNOSIS — N189 Chronic kidney disease, unspecified: Secondary | ICD-10-CM | POA: Diagnosis not present

## 2023-03-04 DIAGNOSIS — E538 Deficiency of other specified B group vitamins: Secondary | ICD-10-CM | POA: Diagnosis not present

## 2023-03-04 DIAGNOSIS — I129 Hypertensive chronic kidney disease with stage 1 through stage 4 chronic kidney disease, or unspecified chronic kidney disease: Secondary | ICD-10-CM | POA: Diagnosis not present

## 2023-03-04 DIAGNOSIS — G8918 Other acute postprocedural pain: Secondary | ICD-10-CM | POA: Diagnosis not present

## 2023-03-04 DIAGNOSIS — Z888 Allergy status to other drugs, medicaments and biological substances status: Secondary | ICD-10-CM | POA: Diagnosis not present

## 2023-03-04 DIAGNOSIS — H42 Glaucoma in diseases classified elsewhere: Secondary | ICD-10-CM | POA: Diagnosis not present

## 2023-03-04 DIAGNOSIS — Z9049 Acquired absence of other specified parts of digestive tract: Secondary | ICD-10-CM | POA: Diagnosis not present

## 2023-03-08 ENCOUNTER — Other Ambulatory Visit: Payer: Self-pay | Admitting: Student

## 2023-03-08 DIAGNOSIS — Z96659 Presence of unspecified artificial knee joint: Secondary | ICD-10-CM

## 2023-03-08 DIAGNOSIS — G4700 Insomnia, unspecified: Secondary | ICD-10-CM | POA: Diagnosis not present

## 2023-03-08 DIAGNOSIS — M6281 Muscle weakness (generalized): Secondary | ICD-10-CM | POA: Diagnosis not present

## 2023-03-08 DIAGNOSIS — E1140 Type 2 diabetes mellitus with diabetic neuropathy, unspecified: Secondary | ICD-10-CM | POA: Diagnosis not present

## 2023-03-08 DIAGNOSIS — Z96651 Presence of right artificial knee joint: Secondary | ICD-10-CM | POA: Diagnosis not present

## 2023-03-08 DIAGNOSIS — E782 Mixed hyperlipidemia: Secondary | ICD-10-CM | POA: Diagnosis not present

## 2023-03-08 DIAGNOSIS — M1711 Unilateral primary osteoarthritis, right knee: Secondary | ICD-10-CM | POA: Diagnosis not present

## 2023-03-08 DIAGNOSIS — R278 Other lack of coordination: Secondary | ICD-10-CM | POA: Diagnosis not present

## 2023-03-08 DIAGNOSIS — I4891 Unspecified atrial fibrillation: Secondary | ICD-10-CM | POA: Diagnosis not present

## 2023-03-08 DIAGNOSIS — Z794 Long term (current) use of insulin: Secondary | ICD-10-CM | POA: Diagnosis not present

## 2023-03-08 DIAGNOSIS — R809 Proteinuria, unspecified: Secondary | ICD-10-CM | POA: Diagnosis not present

## 2023-03-08 DIAGNOSIS — R2681 Unsteadiness on feet: Secondary | ICD-10-CM | POA: Diagnosis not present

## 2023-03-08 DIAGNOSIS — R2689 Other abnormalities of gait and mobility: Secondary | ICD-10-CM | POA: Diagnosis not present

## 2023-03-08 DIAGNOSIS — E1129 Type 2 diabetes mellitus with other diabetic kidney complication: Secondary | ICD-10-CM | POA: Diagnosis not present

## 2023-03-08 DIAGNOSIS — E119 Type 2 diabetes mellitus without complications: Secondary | ICD-10-CM | POA: Diagnosis not present

## 2023-03-08 DIAGNOSIS — E1142 Type 2 diabetes mellitus with diabetic polyneuropathy: Secondary | ICD-10-CM | POA: Diagnosis not present

## 2023-03-08 DIAGNOSIS — E441 Mild protein-calorie malnutrition: Secondary | ICD-10-CM | POA: Diagnosis not present

## 2023-03-08 DIAGNOSIS — H409 Unspecified glaucoma: Secondary | ICD-10-CM | POA: Diagnosis not present

## 2023-03-08 DIAGNOSIS — R1011 Right upper quadrant pain: Secondary | ICD-10-CM | POA: Diagnosis not present

## 2023-03-08 DIAGNOSIS — Z471 Aftercare following joint replacement surgery: Secondary | ICD-10-CM | POA: Diagnosis not present

## 2023-03-08 DIAGNOSIS — Z23 Encounter for immunization: Secondary | ICD-10-CM | POA: Diagnosis not present

## 2023-03-08 DIAGNOSIS — B351 Tinea unguium: Secondary | ICD-10-CM | POA: Diagnosis not present

## 2023-03-08 DIAGNOSIS — I4819 Other persistent atrial fibrillation: Secondary | ICD-10-CM | POA: Diagnosis not present

## 2023-03-08 DIAGNOSIS — E785 Hyperlipidemia, unspecified: Secondary | ICD-10-CM | POA: Diagnosis not present

## 2023-03-08 DIAGNOSIS — D649 Anemia, unspecified: Secondary | ICD-10-CM | POA: Diagnosis not present

## 2023-03-08 DIAGNOSIS — N400 Enlarged prostate without lower urinary tract symptoms: Secondary | ICD-10-CM | POA: Diagnosis not present

## 2023-03-08 DIAGNOSIS — I10 Essential (primary) hypertension: Secondary | ICD-10-CM | POA: Diagnosis not present

## 2023-03-08 MED ORDER — TRAMADOL HCL 50 MG PO TABS: 50.0000 mg | ORAL_TABLET | Freq: Two times a day (BID) | ORAL | 0 refills | Status: DC

## 2023-03-08 MED ORDER — OXYCODONE HCL 5 MG PO TABS
5.0000 mg | ORAL_TABLET | Freq: Four times a day (QID) | ORAL | 0 refills | Status: DC | PRN
Start: 2023-03-08 — End: 2023-03-10

## 2023-03-08 NOTE — Progress Notes (Signed)
Patient to be admitted for therapy after planned knee replacement. Chronic tramadol use, continue. Prn oxycodone for post op pain.

## 2023-03-09 ENCOUNTER — Encounter: Payer: Self-pay | Admitting: Nurse Practitioner

## 2023-03-09 ENCOUNTER — Non-Acute Institutional Stay (SKILLED_NURSING_FACILITY): Payer: Medicare Other | Admitting: Nurse Practitioner

## 2023-03-09 DIAGNOSIS — E1129 Type 2 diabetes mellitus with other diabetic kidney complication: Secondary | ICD-10-CM

## 2023-03-09 DIAGNOSIS — Z794 Long term (current) use of insulin: Secondary | ICD-10-CM | POA: Diagnosis not present

## 2023-03-09 DIAGNOSIS — R809 Proteinuria, unspecified: Secondary | ICD-10-CM

## 2023-03-09 DIAGNOSIS — G4700 Insomnia, unspecified: Secondary | ICD-10-CM

## 2023-03-09 DIAGNOSIS — M1711 Unilateral primary osteoarthritis, right knee: Secondary | ICD-10-CM | POA: Diagnosis not present

## 2023-03-09 DIAGNOSIS — E782 Mixed hyperlipidemia: Secondary | ICD-10-CM | POA: Diagnosis not present

## 2023-03-09 NOTE — Progress Notes (Unsigned)
Location:  Other [place].  Nursing Home Room Number: [facility] 104A Place of Service:  SNF [phone]) Abbey Chatters, NP  PCP: Alba Cory, MD  Patient Care Team: Alba Cory, MD as PCP - General (Family Medicine) Requested, Self Galen Manila, MD as Consulting Physician (Ophthalmology) Deeann Saint, MD as Consulting Physician (Orthopedic Surgery) Marcina Millard, MD as Consulting Physician (Cardiology) Sherlon Handing, MD as Consulting Physician (Internal Medicine) Linus Galas, DPM (Podiatry) Deirdre Evener, MD (Dermatology) Jeralyn Ruths, MD as Consulting Physician (Oncology)  Extended Emergency Contact Information Primary Emergency Contact: Brennan Bailey, [place] Phone: [phone] Relation: Daughter Secondary Emergency Contact: Baird Kay, MD [place] of [place] Mobile Phone: [phone] Relation: Son  Goals of care: Advanced Directive information    03/09/2023   12:35 PM  Advanced Directives  Does Patient Have a Medical Advance Directive? Yes  Type of Estate agent of [place];Out of facility DNR (pink MOST or yellow form)  Does patient want to make changes to medical advance directive? No - Patient declined  Copy of Healthcare Power of Attorney in Chart? Yes - validated most recent copy scanned in chart (See row information)     Chief Complaint  Patient presents with   Hospitalization Follow-up    Hospital Follow up. Requesting Sleep aid.     HPI:  Pt is a 87 y.o. male seen today for hospital follow up.  S/p right knee replacement. Pt is very independant, still works as a Sports administrator in town. He lives alone and here at [facility] for rehab.  Reports generally does not have trouble sleeping but due to position he has to be in post op has had a hard time. He was given trazodone in hospital and would like to have it while in rehab.    Also reports oxycodone has not been effective to control pain. He has been taking tylenol which has been adequate Also had used tramadol at home which was effective.   States he has been working with endocrinology and reducing tresiba- went from 90 units down to 26 but was still have lows in the 50s in the morning so prior to surgery he starting taking 18 units in the morning which still controlled blood sugars without having lows.   Past Medical History:  Diagnosis Date   Allergy    Basal cell carcinoma 10/09/2021   right medial forehead at glabella, Mohs 01/22/22   Chronic kidney disease    Diabetes mellitus without complication (HCC)    Diabetic neuropathy (HCC)    Diverticula, colon 1980's   Diverticulosis    Enlarged prostate    GERD (gastroesophageal reflux disease)    Glaucoma    Hemorrhoids    High cholesterol    History of hiatal hernia    Hyperlipidemia    Hypertension    Obesity    Onychomycosis    Seizures (HCC)    Small bowel obstruction (HCC) 1990's   Urination disorder    Past Surgical History:  Procedure Laterality Date   APPENDECTOMY     BLADDER DIVERTICULECTOMY  2013   CARDIOVERSION N/A 02/24/2023   Procedure: CARDIOVERSION;  Surgeon: Marcina Millard, MD;  Location: [facility] ORS;  Service: Cardiovascular;  Laterality: N/A;   CARPAL TUNNEL RELEASE  01/2022   revision right side   COLON SURGERY Left 1980's   COLONOSCOPY W/ POLYPECTOMY  1990's  Dr Okey Dupre   COLONOSCOPY WITH PROPOFOL N/A 12/21/2014   Procedure: COLONOSCOPY WITH PROPOFOL;  Surgeon: Christena Deem, MD;  Location: [facility] ENDOSCOPY;  Service: Endoscopy;  Laterality: N/A;   COLONOSCOPY WITH PROPOFOL N/A 08/16/2017   Procedure: COLONOSCOPY WITH PROPOFOL;  Surgeon: Christena Deem, MD;  Location: [facility] ENDOSCOPY;  Service: Endoscopy;  Laterality: N/A;   COLONOSCOPY WITH PROPOFOL N/A 01/31/2021   Procedure: COLONOSCOPY WITH PROPOFOL;  Surgeon: Earline Mayotte, MD;  Location: [facility]  ENDOSCOPY;  Service: Endoscopy;  Laterality: N/A;   ESOPHAGOGASTRODUODENOSCOPY (EGD) WITH PROPOFOL N/A 02/15/2015   Procedure: ESOPHAGOGASTRODUODENOSCOPY (EGD) WITH PROPOFOL;  Surgeon: Christena Deem, MD;  Location: [facility] ENDOSCOPY;  Service: Endoscopy;  Laterality: N/A;   ESOPHAGOGASTRODUODENOSCOPY (EGD) WITH PROPOFOL N/A 06/10/2017   Procedure: ESOPHAGOGASTRODUODENOSCOPY (EGD) WITH PROPOFOL;  Surgeon: Christena Deem, MD;  Location: [facility] ENDOSCOPY;  Service: Endoscopy;  Laterality: N/A;   ESOPHAGOGASTRODUODENOSCOPY (EGD) WITH PROPOFOL N/A 08/16/2017   Procedure: ESOPHAGOGASTRODUODENOSCOPY (EGD) WITH PROPOFOL;  Surgeon: Christena Deem, MD;  Location: [facility] ENDOSCOPY;  Service: Endoscopy;  Laterality: N/A;   ESOPHAGOGASTRODUODENOSCOPY (EGD) WITH PROPOFOL N/A 01/31/2021   Procedure: ESOPHAGOGASTRODUODENOSCOPY (EGD) WITH PROPOFOL;  Surgeon: Earline Mayotte, MD;  Location: [facility] ENDOSCOPY;  Service: Endoscopy;  Laterality: N/A;   EYE SURGERY     GLAUCOMA SURGERY Left 1990's   [facility]   HEMORROIDECTOMY  1980's   [facility]   HERNIA REPAIR     [facility], Dr Carol Ada HERNIA REPAIR Bilateral [phone]   LARYNX SURGERY  1990   3 times   MOHS SURGERY  01/22/2022   Dr. Verdell Face [facility]   RETINAL DETACHMENT SURGERY Right    TONSILLECTOMY     turp  2013   URETER SURGERY  2013   Dr Achilles Dunk    Allergies  Allergen Reactions   Demerol [Meperidine] Other (See Comments) and Anaphylaxis    MAKES BLOOD PRESSURE BOTTOM OUT hypotension   Dilaudid [Hydromorphone Hcl]    Hydromorphone     Other reaction(s): Unknown MAKES BLOOD PRESSURE BOTTOM OUT   Meperidine Hcl    Actos [Pioglitazone] Other (See Comments) and Hives   Dapagliflozin Rash   Penicillins Rash    Outpatient Encounter Medications as of 03/09/2023  Medication Sig   acetaminophen (TYLENOL) 500 MG tablet Take 1,000 mg by mouth every 12 (twelve) hours as needed.   apixaban (ELIQUIS) 5 MG TABS tablet Take 5 mg by  mouth 2 (two) times daily.   brimonidine (ALPHAGAN P) 0.1 % SOLN Place 1 drop into the left eye 2 (two) times daily.    desonide (DESOWEN) 0.05 % ointment Apply to rash under eye QD-BID PRN   diltiazem (CARDIZEM CD) 120 MG 24 hr capsule Take 120 mg by mouth daily.   dorzolamide (TRUSOPT) 2 % ophthalmic solution Place 1 drop into the left eye 2 (two) times daily.   empagliflozin (JARDIANCE) 25 MG TABS tablet Take by mouth daily.   Exenatide ER (BYDUREON BCISE) 2 MG/0.85ML AUIJ Inject 2 mg into the skin once a week.   finasteride (PROSCAR) 5 MG tablet Take 1 tablet by mouth once daily   flecainide (TAMBOCOR) 50 MG tablet Take 50 mg by mouth 2 (two) times daily.   glipiZIDE (GLUCOTROL) 10 MG tablet Take 10 mg by mouth daily.   hydrocortisone 2.5 % cream APPLY TO THE AFFECTED AREA OF FACE AND EARS EVERY OTHER NIGHT ALTERNATING WITH KETOCONAZOLE CREAM   insulin degludec (TRESIBA FLEXTOUCH) 200 UNIT/ML FlexTouch Pen Inject 26 Units into  the skin in the morning.   ketoconazole (NIZORAL) 2 % cream Apply to penis twice daily   ketoconazole (NIZORAL) 2 % shampoo Apply 1 Application topically 3 (three) times a week. Shampoo scalp 3 days a week let sit 5 minutes and rinse off   loratadine (CLARITIN) 10 MG tablet Take 10 mg by mouth 2 (two) times daily.   metFORMIN (GLUCOPHAGE) 1000 MG tablet Take 1,000 mg by mouth 2 (two) times daily with a meal.   mometasone (ELOCON) 0.1 % cream Apply topically daily.   Multiple Vitamin (MULTIVITAMIN) tablet Take 1 tablet by mouth daily.   omega-3 acid ethyl esters (LOVAZA) 1 g capsule Take 2 capsules (2 g total) by mouth 2 (two) times daily.   omeprazole (PRILOSEC) 20 MG capsule Take 1 capsule (20 mg total) by mouth daily.   oxyCODONE (ROXICODONE) 5 MG immediate release tablet Take 1 tablet (5 mg total) by mouth every 6 (six) hours as needed for severe pain.   rosuvastatin (CRESTOR) 10 MG tablet Take 1 tablet (10 mg total) by mouth at bedtime.   tadalafil (CIALIS) 20 MG  tablet Take 20 mg by mouth daily as needed for erectile dysfunction.   telmisartan-hydrochlorothiazide (MICARDIS HCT) 80-12.5 MG tablet Take 1 tablet by mouth daily.   traMADol (ULTRAM) 50 MG tablet Take 25 mg by mouth daily as needed.   [DISCONTINUED] bisoprolol (ZEBETA) 10 MG tablet Take 1 tablet (10 mg total) by mouth daily.   [DISCONTINUED] mometasone (ELOCON) 0.1 % lotion APPLY TO THE AFFECTED AREAS OF SCALP THREE NIGHTS PER WEEK   [DISCONTINUED] Semaglutide,0.25 or 0.5MG /DOS, (OZEMPIC, 0.25 OR 0.5 MG/DOSE,) 2 MG/1.5ML SOPN Inject into the skin.   [DISCONTINUED] tadalafil (CIALIS) 20 MG tablet TAKE 1/2 TO 1 (ONE-HALF TO ONE) TABLET BY MOUTH EVERY OTHER DAY AS NEEDED FOR ERECTILE DYSFUNCTION (Patient taking differently: Take 20 mg by mouth daily as needed.)   [DISCONTINUED] timolol (TIMOPTIC) 0.5 % ophthalmic solution INSTILL 1 DROP INTO LEFT EYE TWICE DAILY   [DISCONTINUED] traMADol (ULTRAM) 50 MG tablet Take 1 tablet (50 mg total) by mouth 2 (two) times daily.   No facility-administered encounter medications on file as of 03/09/2023.    Review of Systems ***  Immunization History  Administered Date(s) Administered   Fluad Quad(high Dose 65+) 02/28/2019, 03/04/2021, 03/05/2022   Influenza, High Dose Seasonal PF 03/12/2016, 02/22/2017, 02/25/2018   Influenza,inj,Quad PF,6+ Mos 03/28/2015   Influenza-Unspecified 02/13/2020   Moderna Sars-Covid-2 Vaccination 09/20/2020   PFIZER(Purple Top)SARS-COV-2 Vaccination 06/21/2019, 07/12/2019, 03/12/2020   Pfizer Covid-19 Vaccine Bivalent Booster 67yrs & up 02/28/2021, 03/29/2022   Pneumococcal Conjugate-13 08/07/2013, 09/04/2013   Pneumococcal Polysaccharide-23 04/04/2012   Rotavirus,unspecified  05/22/2022   Tdap 04/04/2012, 03/29/2022   Zoster Recombinant(Shingrix) 12/14/2017, 02/21/2018   Zoster, Live 09/17/2010   Pertinent  Health Maintenance Due  Topic Date Due   INFLUENZA VACCINE  01/14/2023   HEMOGLOBIN A1C  04/09/2023   FOOT EXAM   04/10/2023   OPHTHALMOLOGY EXAM  10/29/2023   Colonoscopy  02/01/2024      07/24/2022    8:11 AM 10/05/2022    3:30 PM 12/07/2022    1:24 PM 01/06/2023    2:23 PM 01/25/2023   11:43 AM  Fall Risk  Falls in the past year? 0 0 0 0 0  Was there an injury with Fall? 0 0 0  0  Fall Risk Category Calculator 0 0 0  0  Patient at Risk for Falls Due to No Fall Risks Impaired balance/gait Impaired balance/gait Impaired balance/gait  No Fall Risks  Fall risk Follow up Falls prevention discussed;Education provided;Falls evaluation completed  Falls prevention discussed Falls prevention discussed;Education provided Falls prevention discussed;Education provided;Falls evaluation completed   Functional Status Survey:    Vitals:   03/09/23 1206  BP: (!) 157/86  Pulse: (!) 116  Resp: 18  Temp: 98.3 F (36.8 C)  SpO2: 95%  Weight: 189 lb 3.2 oz (85.8 kg)  Height: 6\' 3"  (1.905 m)   Body mass index is 23.65 kg/m. Physical Exam***  Labs reviewed: Recent Labs    06/03/22 1404 07/24/22 1018  NA 142 134*  K 4.9 3.8  CL 105 99  CO2 31 23  GLUCOSE 149* 95  BUN 19 21  CREATININE 0.68* 0.84  CALCIUM 9.1 8.5*   Recent Labs    06/03/22 1404 07/24/22 1018  AST 24 45*  ALT 25 42  ALKPHOS  --  62  BILITOT 1.0 1.7*  PROT 6.4 7.0  ALBUMIN  --  4.0   Recent Labs    12/22/22 1351 01/25/23 1322 02/22/23 1535  WBC 4.2 4.1 3.7*  NEUTROABS 2.2 2,390 1,994  HGB 13.1 13.0* 13.5  HCT 40.3 38.2* 39.9  MCV 108.3* 101.6* 100.0  PLT 112* 101* 119*   Lab Results  Component Value Date   TSH 1.42 12/05/2015   Lab Results  Component Value Date   HGBA1C 9.0 10/08/2022   Lab Results  Component Value Date   CHOL 68 06/03/2022   HDL 41 06/03/2022   LDLCALC 11 06/03/2022   TRIG 75 06/03/2022   CHOLHDL 1.7 06/03/2022    Significant Diagnostic Results in last 30 days:  No results found.  Assessment/Plan 1. Primary osteoarthritis of right knee S/p total knee, doing well post op Does not  have cooling device for knee, but working to keep ice on knee while not doing rehab.  Pain well controlled   2. Type 2 diabetes mellitus with microalbuminuria, with long-term current use of insulin (HCC) -will reduce insulin to 18 units daily at this time to avoid hypoglycemia.   3. Insomnia, unspecified type Start trazodone 25 mg at bedtime to help sleep.   4. Mixed hyperlipidemia Current order for lovaza 1 capsule BID but taking 2 capsule BID. Will update order   Janene Harvey. [facility]ag[facility] [phone]

## 2023-03-10 ENCOUNTER — Encounter: Payer: Self-pay | Admitting: Student

## 2023-03-10 ENCOUNTER — Non-Acute Institutional Stay (SKILLED_NURSING_FACILITY): Payer: Medicare Other | Admitting: Student

## 2023-03-10 DIAGNOSIS — N400 Enlarged prostate without lower urinary tract symptoms: Secondary | ICD-10-CM

## 2023-03-10 DIAGNOSIS — I4819 Other persistent atrial fibrillation: Secondary | ICD-10-CM | POA: Diagnosis not present

## 2023-03-10 DIAGNOSIS — E1129 Type 2 diabetes mellitus with other diabetic kidney complication: Secondary | ICD-10-CM

## 2023-03-10 DIAGNOSIS — Z794 Long term (current) use of insulin: Secondary | ICD-10-CM

## 2023-03-10 DIAGNOSIS — I10 Essential (primary) hypertension: Secondary | ICD-10-CM

## 2023-03-10 DIAGNOSIS — D649 Anemia, unspecified: Secondary | ICD-10-CM

## 2023-03-10 DIAGNOSIS — G4700 Insomnia, unspecified: Secondary | ICD-10-CM | POA: Diagnosis not present

## 2023-03-10 DIAGNOSIS — R809 Proteinuria, unspecified: Secondary | ICD-10-CM | POA: Diagnosis not present

## 2023-03-10 DIAGNOSIS — Z96659 Presence of unspecified artificial knee joint: Secondary | ICD-10-CM

## 2023-03-10 NOTE — Progress Notes (Unsigned)
Provider:  Dr. Earnestine Mealing Location:  Other [place].  Nursing Home Room Number: [facility] 104A Place of Service:  SNF (31)  PCP: Alba Cory, MD Patient Care Team: Alba Cory, MD as PCP - General (Family Medicine) Requested, Self Galen Manila, MD as Consulting Physician (Ophthalmology) Deeann Saint, MD as Consulting Physician (Orthopedic Surgery) Marcina Millard, MD as Consulting Physician (Cardiology) Sherlon Handing, MD as Consulting Physician (Internal Medicine) Linus Galas, [place] (Podiatry) Deirdre Evener, MD (Dermatology) Jeralyn Ruths, MD as Consulting Physician (Oncology)  Extended Emergency Contact Information Primary Emergency Contact: Brennan Bailey, [place] Phone: [phone] Relation: Daughter Secondary Emergency Contact: Baird Kay, MD [place] of [place] Mobile Phone: [phone] Relation: Son  Code Status: Full Code Goals of Care: Advanced Directive information    03/10/2023    9:07 AM  Advanced Directives  Does Patient Have a Medical Advance Directive? Yes  Type of Estate agent of [place];Living will  Does patient want to make changes to medical advance directive? No - Patient declined  Copy of Healthcare Power of Attorney in Chart? Yes - validated most recent copy scanned in chart (See row information)    Chief Complaint  Patient presents with   New Admit To SNF    Admission.     HPI: Patient is a 87 y.o. male seen today for admission to [facility].   S/p planned right knee replacement.   He was having bradycardia a few weeks ago and had shortness of breth. He has Atrial fibrillation and had a monitor on him. Discontinued beta blocker. Now he is on ancoagulation. They planned on doing cardioversion before surgery but it only lasted 1 day. He has cardizem and flecainide.   He had three surgies on the right for  retinal detatchment. Had Cataract surgery bilaterally.   Eating well, normal bowel movements. Denies significant pain, prefers tylenol. Worked with PT yesterday and waiting for today.   Previously was taking significantly more tresiba and was started on glipizide now down to 18 units nightly. Continues taking jardiance, glipizide, metformin and tresiba. Discussed risk of hypoglycemia. He had numerous hypoglycemic events on higher doses.   Past Medical History:  Diagnosis Date   Allergy    Basal cell carcinoma 10/09/2021   right medial forehead at glabella, Mohs 01/22/22   Chronic kidney disease    Diabetes mellitus without complication (HCC)    Diabetic neuropathy (HCC)    Diverticula, colon 1980's   Diverticulosis    Enlarged prostate    GERD (gastroesophageal reflux disease)    Glaucoma    Hemorrhoids    High cholesterol    History of hiatal hernia    Hyperlipidemia    Hypertension    Obesity    Onychomycosis    Seizures (HCC)    Small bowel obstruction (HCC) 1990's   Urination disorder    Past Surgical History:  Procedure Laterality Date   APPENDECTOMY     BLADDER DIVERTICULECTOMY  2013   CARDIOVERSION N/A 02/24/2023   Procedure: CARDIOVERSION;  Surgeon: Marcina Millard, MD;  Location: [facility] ORS;  Service: Cardiovascular;  Laterality: N/A;   CARPAL TUNNEL RELEASE  01/2022   revision right side   COLON SURGERY Left 1980's   COLONOSCOPY W/ POLYPECTOMY  1990's   Dr Okey Dupre   COLONOSCOPY WITH PROPOFOL N/A 12/21/2014   Procedure: COLONOSCOPY WITH PROPOFOL;  Surgeon: Christena Deem, MD;  Location: [facility] ENDOSCOPY;  Service: Endoscopy;  Laterality: N/A;   COLONOSCOPY WITH PROPOFOL N/A 08/16/2017   Procedure: COLONOSCOPY WITH PROPOFOL;  Surgeon: Christena Deem, MD;  Location: [facility] ENDOSCOPY;  Service: Endoscopy;  Laterality: N/A;   COLONOSCOPY WITH PROPOFOL N/A 01/31/2021   Procedure: COLONOSCOPY WITH PROPOFOL;  Surgeon: Earline Mayotte, MD;  Location: [facility]  ENDOSCOPY;  Service: Endoscopy;  Laterality: N/A;   ESOPHAGOGASTRODUODENOSCOPY (EGD) WITH PROPOFOL N/A 02/15/2015   Procedure: ESOPHAGOGASTRODUODENOSCOPY (EGD) WITH PROPOFOL;  Surgeon: Christena Deem, MD;  Location: [facility] ENDOSCOPY;  Service: Endoscopy;  Laterality: N/A;   ESOPHAGOGASTRODUODENOSCOPY (EGD) WITH PROPOFOL N/A 06/10/2017   Procedure: ESOPHAGOGASTRODUODENOSCOPY (EGD) WITH PROPOFOL;  Surgeon: Christena Deem, MD;  Location: [facility] ENDOSCOPY;  Service: Endoscopy;  Laterality: N/A;   ESOPHAGOGASTRODUODENOSCOPY (EGD) WITH PROPOFOL N/A 08/16/2017   Procedure: ESOPHAGOGASTRODUODENOSCOPY (EGD) WITH PROPOFOL;  Surgeon: Christena Deem, MD;  Location: [facility] ENDOSCOPY;  Service: Endoscopy;  Laterality: N/A;   ESOPHAGOGASTRODUODENOSCOPY (EGD) WITH PROPOFOL N/A 01/31/2021   Procedure: ESOPHAGOGASTRODUODENOSCOPY (EGD) WITH PROPOFOL;  Surgeon: Earline Mayotte, MD;  Location: [facility] ENDOSCOPY;  Service: Endoscopy;  Laterality: N/A;   EYE SURGERY     GLAUCOMA SURGERY Left 1990's   [facility]   HEMORROIDECTOMY  1980's   [facility]   HERNIA REPAIR     [facility], Dr Carol Ada HERNIA REPAIR Bilateral [phone]   LARYNX SURGERY  1990   3 times   MOHS SURGERY  01/22/2022   Dr. Verdell Face - [facility]   RETINAL DETACHMENT SURGERY Right    TONSILLECTOMY     turp  2013   URETER SURGERY  2013   Dr Achilles Dunk    reports that he has never smoked. He has never used smokeless tobacco. He reports that he does not drink alcohol and does not use drugs. 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)   Tobacco Use   Smoking status: Never   Smokeless tobacco: Never   Tobacco comments:    smoking cessation  materials not required  Vaping Use   Vaping status: Never Used  Substance and Sexual Activity   Alcohol use: No   Drug use: No   Sexual activity: Yes  Other Topics Concern   Not on file  Social History Narrative   Married , still works as a Development worker, community Garment/textile technologist)   Three children ( one died in his 77's with colon cancer) , one lives in [place] and the other one in [place]   His siblings are in [place] and [place]   Social Determinants of Health   Financial Resource Strain: Low Risk  (03/04/2021)   Overall Financial Resource Strain (CARDIA)    Difficulty of Paying Living Expenses: Not hard at all  Food Insecurity: No Food Insecurity (03/05/2022)   Hunger Vital Sign    Worried About Running Out of Food in the Last Year: Never true    Ran Out of Food in the Last Year: Never true  Transportation Needs: No Transportation Needs (03/04/2021)   PRAPARE - Administrator, Civil Service (Medical): No    Lack of Transportation (Non-Medical): No  Physical  Activity: Inactive (03/05/2022)   Exercise Vital Sign    Days of Exercise per Week: 0 days    Minutes of Exercise per Session: 0 min  Stress: No Stress Concern Present (03/05/2022)   [employer] of Occupational Health - Occupational Stress Questionnaire    Feeling of Stress : Not at all  Social Connections: Socially Integrated (03/04/2021)   Social Connection and Isolation Panel [NHANES]    Frequency of Communication with Friends and Family: More than three times a week    Frequency of Social Gatherings with Friends and Family: Once a week    Attends Religious Services: More than 4 times per year    Active Member of [employer] or Organizations: Yes    Attends Banker Meetings: Never    Marital Status: Married  Catering manager Violence: Not At Risk (03/05/2022)   Humiliation, Afraid, Rape, and Kick questionnaire    Fear of Current or Ex-Partner: No    Emotionally Abused: No    Physically Abused: No    Sexually Abused: No     Functional Status Survey:    Family History  Problem Relation Age of Onset   Diabetes Mother    Cataracts Mother    Metabolic syndrome Mother    Blindness Mother        r/t diabetes   Cataracts Father    Glaucoma Father        Retina-Detachment   Atrial fibrillation Father    Diverticulitis Sister    Glaucoma Brother    Diabetes Brother    Cancer Sister        Gallbladder   Cancer Daughter     Health Maintenance  Topic Date Due   INFLUENZA VACCINE  01/14/2023   COVID-19 Vaccine (7 - 2023-24 season) 02/14/2023   Medicare Annual Wellness (AWV)  03/06/2023   HEMOGLOBIN A1C  04/09/2023   FOOT EXAM  04/10/2023   Diabetic kidney evaluation - Urine ACR  06/04/2023   Diabetic kidney evaluation - eGFR measurement  07/25/2023   OPHTHALMOLOGY EXAM  10/29/2023   Colonoscopy  02/01/2024   DTaP/Tdap/Td (3 - Td or Tdap) 03/29/2032   Pneumonia Vaccine 79+ Years old  Completed   Zoster Vaccines- Shingrix  Completed   HPV VACCINES  Aged Out    Allergies  Allergen Reactions   Demerol [Meperidine] Other (See Comments) and Anaphylaxis    MAKES BLOOD PRESSURE BOTTOM OUT hypotension   Dilaudid [Hydromorphone Hcl]    Hydromorphone     Other reaction(s): Unknown MAKES BLOOD PRESSURE BOTTOM OUT   Meperidine Hcl    Actos [Pioglitazone] Other (See Comments) and Hives   Dapagliflozin Rash   Penicillins Rash    Outpatient Encounter Medications as of 03/10/2023  Medication Sig   acetaminophen (TYLENOL) 500 MG tablet Take 1,000 mg by mouth every 12 (twelve) hours as needed. Give 1000mg  by mouth every 8 hours as needed   apixaban (ELIQUIS) 5 MG TABS tablet Take 5 mg by mouth 2 (two) times daily.   brimonidine (ALPHAGAN P) 0.1 % SOLN Place 1 drop into the left eye 2 (two) times daily.    desonide (DESOWEN) 0.05 % ointment Apply to rash under eye QD-BID PRN   diltiazem (CARDIZEM CD) 120 MG 24 hr capsule Take 120 mg by mouth daily.   dorzolamide (TRUSOPT) 2 % ophthalmic solution Place 1  drop into the left eye 2 (two) times daily.   empagliflozin (JARDIANCE) 25 MG TABS tablet Take 25 mg by mouth daily.   Exenatide  ER (BYDUREON BCISE) 2 MG/0.85ML AUIJ Inject 2 mg into the skin once a week.   finasteride (PROSCAR) 5 MG tablet Take 1 tablet by mouth once daily   flecainide (TAMBOCOR) 50 MG tablet Take 50 mg by mouth 2 (two) times daily.   glipiZIDE (GLUCOTROL) 10 MG tablet Take 10 mg by mouth daily.   hydrocortisone 2.5 % cream APPLY TO THE AFFECTED AREA OF FACE AND EARS EVERY OTHER NIGHT ALTERNATING WITH KETOCONAZOLE CREAM   insulin degludec (TRESIBA FLEXTOUCH) 200 UNIT/ML FlexTouch Pen Inject 18 Units into the skin in the morning.   ketoconazole (NIZORAL) 2 % cream Apply 1 Application topically daily as needed for irritation.   loratadine (CLARITIN) 10 MG tablet Take 10 mg by mouth 2 (two) times daily.   metFORMIN (GLUCOPHAGE) 1000 MG tablet Take 1,000 mg by mouth 2 (two) times daily with a meal.   MOMETASONE FUROATE EX Apply to dermatitis topically once daily every Monday, Wednesday and Friday.   Multiple Vitamin (MULTIVITAMIN) tablet Take 1 tablet by mouth daily.   omega-3 acid ethyl esters (LOVAZA) 1 g capsule Take 2 capsules (2 g total) by mouth 2 (two) times daily.   omeprazole (PRILOSEC) 20 MG capsule Take 1 capsule (20 mg total) by mouth daily.   rosuvastatin (CRESTOR) 10 MG tablet Take 1 tablet (10 mg total) by mouth at bedtime.   tadalafil (CIALIS) 20 MG tablet Take 20 mg by mouth daily as needed for erectile dysfunction.   telmisartan-hydrochlorothiazide (MICARDIS HCT) 80-12.5 MG tablet Take 1 tablet by mouth daily.   traZODone (DESYREL) 50 MG tablet Take 25 mg by mouth at bedtime.   [DISCONTINUED] ketoconazole (NIZORAL) 2 % cream Apply to penis twice daily (Patient taking differently: Apply to areas topically every 12 hours as needed.)   [DISCONTINUED] mometasone (ELOCON) 0.1 % cream Apply topically daily. (Patient taking differently: Apply 1 Application topically  daily. Monday, Wednesday and Friday.)   [DISCONTINUED] traMADol (ULTRAM) 50 MG tablet Take 25 mg by mouth daily as needed.   [DISCONTINUED] ketoconazole (NIZORAL) 2 % shampoo Apply 1 Application topically 3 (three) times a week. Shampoo scalp 3 days a week let sit 5 minutes and rinse off   [DISCONTINUED] oxyCODONE (ROXICODONE) 5 MG immediate release tablet Take 1 tablet (5 mg total) by mouth every 6 (six) hours as needed for severe pain.   No facility-administered encounter medications on file as of 03/10/2023.    Review of Systems  Vitals:   03/10/23 0852 03/10/23 0910  BP: (!) 148/88 (!) 141/86  Pulse: 96   Resp: 20   Temp: 97.7 F (36.5 C)   SpO2: 91%   Weight: 189 lb 3.2 oz (85.8 kg)   Height: 6\' 3"  (1.905 m)    Body mass index is 23.65 kg/m. Physical Exam Constitutional:      Appearance: Normal appearance.  Cardiovascular:     Rate and Rhythm: Normal rate. Rhythm irregular.     Pulses: Normal pulses.  Pulmonary:     Effort: Pulmonary effort is normal.     Breath sounds: Normal breath sounds.  Abdominal:     General: Abdomen is flat.     Palpations: Abdomen is soft.  Skin:    Comments: Right knee with swelling, wound dressing soiled at this time.   Neurological:     Mental Status: He is alert and oriented to person, place, and time.     Labs reviewed: Basic Metabolic Panel: Recent Labs    06/03/22 1404 07/24/22 1018  NA 142  134*  K 4.9 3.8  CL 105 99  CO2 31 23  GLUCOSE 149* 95  BUN 19 21  CREATININE 0.68* 0.84  CALCIUM 9.1 8.5*   Liver Function Tests: Recent Labs    06/03/22 1404 07/24/22 1018  AST 24 45*  ALT 25 42  ALKPHOS  --  62  BILITOT 1.0 1.7*  PROT 6.4 7.0  ALBUMIN  --  4.0   Recent Labs    07/24/22 1018  LIPASE 33   No results for input(s): "AMMONIA" in the last 8760 hours. CBC: Recent Labs    12/22/22 1351 01/25/23 1322 02/22/23 1535  WBC 4.2 4.1 3.7*  NEUTROABS 2.2 2,390 1,994  HGB 13.1 13.0* 13.5  HCT 40.3 38.2* 39.9   MCV 108.3* 101.6* 100.0  PLT 112* 101* 119*   Cardiac Enzymes: No results for input(s): "CKTOTAL", "CKMB", "CKMBINDEX", "TROPONINI" in the last 8760 hours. BNP: Invalid input(s): "POCBNP" Lab Results  Component Value Date   HGBA1C 9.0 10/08/2022   Lab Results  Component Value Date   TSH 1.42 12/05/2015   Lab Results  Component Value Date   VITAMINB12 1,161 (H) 02/22/2023   Lab Results  Component Value Date   FOLATE >24.0 02/22/2023   Lab Results  Component Value Date   IRON 93 02/22/2023   TIBC 290 02/22/2023   FERRITIN 55 02/22/2023    Imaging and Procedures obtained prior to SNF admission: No results found.  Assessment/Plan History of total knee replacement, unspecified laterality  Type 2 diabetes mellitus with microalbuminuria, with long-term current use of insulin (HCC)  Hypertension, benign  Anemia, unspecified type  Insomnia, unspecified type  Benign prostatic hyperplasia, unspecified whether lower urinary tract symptoms present  Persistent atrial fibrillation [facility]) Patient with planned right knee replacement successful.  Pain well-controlled tolerating a normal diet.  History of atrial fibrillation preparing for potential cardioversion in the future however deferred due to need for surgery.  Started on Eliquis 5 mg twice daily as well as Cardizem and flecainide for rate control.  Patient appears to still be in atrial fibrillation despite these medication, will continue to monitor.  Pain well-controlled with Tylenol we will continue at this time discontinue tramadol per patient preference.  Diabetes well-controlled with A1c of 5.7 concern for numerous hypoglycemic events with Evaristo Bury and oral medications.  Continue daily fasted glucose checks and Tresiba 18 units with glipizide and metformin.  Continue Crestor and Jardiance.  Patient started trazodone 25 mg nightly here in the facility has tolerated well with like to continue.  Blood pressure slightly above goal  at this time however will continue 3 times daily B P blood pressure checks to determine need for change in blood pressure management.  Family/ staff Communication:   Labs/tests ordered: CBC BMP  I spent greater than 50 minutes for the care of this patient in face to face time, chart review, clinical documentation, patient education.

## 2023-03-11 ENCOUNTER — Encounter: Payer: Self-pay | Admitting: Student

## 2023-03-11 DIAGNOSIS — E1129 Type 2 diabetes mellitus with other diabetic kidney complication: Secondary | ICD-10-CM | POA: Insufficient documentation

## 2023-03-11 DIAGNOSIS — R809 Proteinuria, unspecified: Secondary | ICD-10-CM | POA: Insufficient documentation

## 2023-03-11 DIAGNOSIS — D649 Anemia, unspecified: Secondary | ICD-10-CM | POA: Insufficient documentation

## 2023-03-11 DIAGNOSIS — I10 Essential (primary) hypertension: Secondary | ICD-10-CM | POA: Insufficient documentation

## 2023-03-11 DIAGNOSIS — Z96659 Presence of unspecified artificial knee joint: Secondary | ICD-10-CM | POA: Insufficient documentation

## 2023-03-11 DIAGNOSIS — G4700 Insomnia, unspecified: Secondary | ICD-10-CM | POA: Insufficient documentation

## 2023-03-11 LAB — CBC AND DIFFERENTIAL
HCT: 35 — AB (ref 41–53)
Hemoglobin: 11.6 — AB (ref 13.5–17.5)
Neutrophils Absolute: 1997
Platelets: 141 10*3/uL — AB (ref 150–400)
WBC: 3.9

## 2023-03-11 LAB — CBC: RBC: 3.53 — AB (ref 3.87–5.11)

## 2023-04-01 ENCOUNTER — Encounter: Payer: Self-pay | Admitting: Nurse Practitioner

## 2023-04-01 ENCOUNTER — Non-Acute Institutional Stay (SKILLED_NURSING_FACILITY): Payer: Medicare Other | Admitting: Nurse Practitioner

## 2023-04-01 DIAGNOSIS — Z794 Long term (current) use of insulin: Secondary | ICD-10-CM | POA: Diagnosis not present

## 2023-04-01 DIAGNOSIS — M1711 Unilateral primary osteoarthritis, right knee: Secondary | ICD-10-CM | POA: Diagnosis not present

## 2023-04-01 DIAGNOSIS — R809 Proteinuria, unspecified: Secondary | ICD-10-CM

## 2023-04-01 DIAGNOSIS — E1129 Type 2 diabetes mellitus with other diabetic kidney complication: Secondary | ICD-10-CM | POA: Diagnosis not present

## 2023-04-01 DIAGNOSIS — N400 Enlarged prostate without lower urinary tract symptoms: Secondary | ICD-10-CM | POA: Diagnosis not present

## 2023-04-01 DIAGNOSIS — I4819 Other persistent atrial fibrillation: Secondary | ICD-10-CM | POA: Diagnosis not present

## 2023-04-01 DIAGNOSIS — D649 Anemia, unspecified: Secondary | ICD-10-CM

## 2023-04-01 DIAGNOSIS — I10 Essential (primary) hypertension: Secondary | ICD-10-CM | POA: Diagnosis not present

## 2023-04-01 DIAGNOSIS — E782 Mixed hyperlipidemia: Secondary | ICD-10-CM

## 2023-04-01 DIAGNOSIS — E1142 Type 2 diabetes mellitus with diabetic polyneuropathy: Secondary | ICD-10-CM | POA: Diagnosis not present

## 2023-04-01 NOTE — Progress Notes (Signed)
error 

## 2023-04-01 NOTE — Progress Notes (Signed)
Location:  Other Nursing Home Room Number: [facility] 104A Place of Service:  SNF (31)  Carlynn Purl, Danna Hefty, MD  Patient Care Team: Alba Cory, MD as PCP - General (Family Medicine) Requested, Self Galen Manila, MD as Consulting Physician (Ophthalmology) Deeann Saint, MD as Consulting Physician (Orthopedic Surgery) Marcina Millard, MD as Consulting Physician (Cardiology) Sherlon Handing, MD as Consulting Physician (Internal Medicine) Linus Galas, DPM (Podiatry) Deirdre Evener, MD (Dermatology) Jeralyn Ruths, MD as Consulting Physician (Oncology)  Extended Emergency Contact Information Primary Emergency Contact: Brennan Bailey, [place] Phone: [phone] Relation: Daughter Secondary Emergency Contact: Baird Kay, MD [place] of [place] Mobile Phone: [phone] Relation: Son  Goals of care: Advanced Directive information    04/01/2023   10:27 AM  Advanced Directives  Does Patient Have a Medical Advance Directive? Yes  Type of Estate agent of [place];Living will  Does patient want to make changes to medical advance directive? No - Patient declined  Copy of Healthcare Power of Attorney in Chart? Yes - validated most recent copy scanned in chart (See row information)     Chief Complaint  Patient presents with   Medical Management of Chronic Issues    Medical Management of Chronic Issues.     HPI:  Pt is a 87 y.o. male seen today for medical management of chronic diseases.   Patient seen at [facility].  PMH: DM2, HTN, anemia, BPH, A. Fib, HLD, OA,  Had right total knee arthroplasty 9/19 sx, working with PT with the goal of returning to use a cane instead of a walker. Denies pain, just some soreness and mild swelling to right knee.  Reports having low CBG this morning, had been on 18 units of the Tresiba since surgery and had not had any issues  with hypoglycemia. Not symptomatic, was alerted by continuous glucose monitor on his phone. CBGs vary depending on what he eats. Sometimes has ice cream. States he eats 3 meals/day, tries to stay hydrated. Has graham crackers and peanut butter at night. Followed by endocrinology for DM2.  Denies N/V/D or constipation. Weight has dropped but he has been on Bydureon. History of bradycardia and A. Fib, denies shortness of breath, dizziness, chest pain, heart palpitations. Has follow-up with cardiology on Monday. Will see ortho 10/28, hopes they will clear him to drive again.   Past Medical History:  Diagnosis Date   Allergy    Basal cell carcinoma 10/09/2021   right medial forehead at glabella, Mohs 01/22/22   Chronic kidney disease    Diabetes mellitus without complication (HCC)    Diabetic neuropathy (HCC)    Diverticula, colon 1980's   Diverticulosis    Enlarged prostate    GERD (gastroesophageal reflux disease)    Glaucoma    Hemorrhoids    High cholesterol    History of hiatal hernia    Hyperlipidemia    Hypertension    Obesity    Onychomycosis    Seizures (HCC)    Small bowel obstruction (HCC) 1990's   Urination disorder    Past Surgical History:  Procedure Laterality Date   APPENDECTOMY     BLADDER DIVERTICULECTOMY  2013   CARDIOVERSION N/A 02/24/2023   Procedure: CARDIOVERSION;  Surgeon: Marcina Millard, MD;  Location: [facility] ORS;  Service: Cardiovascular;  Laterality: N/A;   CARPAL TUNNEL RELEASE  01/2022   revision right side  COLON SURGERY Left 1980's   COLONOSCOPY W/ POLYPECTOMY  1990's   Dr Okey Dupre   COLONOSCOPY WITH PROPOFOL N/A 12/21/2014   Procedure: COLONOSCOPY WITH PROPOFOL;  Surgeon: Christena Deem, MD;  Location: [facility] ENDOSCOPY;  Service: Endoscopy;  Laterality: N/A;   COLONOSCOPY WITH PROPOFOL N/A 08/16/2017   Procedure: COLONOSCOPY WITH PROPOFOL;  Surgeon: Christena Deem, MD;  Location: [facility] ENDOSCOPY;  Service: Endoscopy;  Laterality: N/A;    COLONOSCOPY WITH PROPOFOL N/A 01/31/2021   Procedure: COLONOSCOPY WITH PROPOFOL;  Surgeon: Earline Mayotte, MD;  Location: [facility] ENDOSCOPY;  Service: Endoscopy;  Laterality: N/A;   ESOPHAGOGASTRODUODENOSCOPY (EGD) WITH PROPOFOL N/A 02/15/2015   Procedure: ESOPHAGOGASTRODUODENOSCOPY (EGD) WITH PROPOFOL;  Surgeon: Christena Deem, MD;  Location: [facility] ENDOSCOPY;  Service: Endoscopy;  Laterality: N/A;   ESOPHAGOGASTRODUODENOSCOPY (EGD) WITH PROPOFOL N/A 06/10/2017   Procedure: ESOPHAGOGASTRODUODENOSCOPY (EGD) WITH PROPOFOL;  Surgeon: Christena Deem, MD;  Location: [facility] ENDOSCOPY;  Service: Endoscopy;  Laterality: N/A;   ESOPHAGOGASTRODUODENOSCOPY (EGD) WITH PROPOFOL N/A 08/16/2017   Procedure: ESOPHAGOGASTRODUODENOSCOPY (EGD) WITH PROPOFOL;  Surgeon: Christena Deem, MD;  Location: [facility] ENDOSCOPY;  Service: Endoscopy;  Laterality: N/A;   ESOPHAGOGASTRODUODENOSCOPY (EGD) WITH PROPOFOL N/A 01/31/2021   Procedure: ESOPHAGOGASTRODUODENOSCOPY (EGD) WITH PROPOFOL;  Surgeon: Earline Mayotte, MD;  Location: [facility] ENDOSCOPY;  Service: Endoscopy;  Laterality: N/A;   EYE SURGERY     GLAUCOMA SURGERY Left 1990's   [facility]   HEMORROIDECTOMY  [phone]   [facility]   HERNIA REPAIR     [facility], Dr Carol Ada HERNIA REPAIR Bilateral [phone]   LARYNX SURGERY  1990   3 times   MOHS SURGERY  01/22/2022   Dr. Verdell Face - [facility]   RETINAL DETACHMENT SURGERY Right    TONSILLECTOMY     turp  2013   URETER SURGERY  2013   Dr Achilles Dunk    Allergies  Allergen Reactions   Demerol [Meperidine] Other (See Comments) and Anaphylaxis    MAKES BLOOD PRESSURE BOTTOM OUT hypotension   Dilaudid [Hydromorphone Hcl]    Hydromorphone     Other reaction(s): Unknown MAKES BLOOD PRESSURE BOTTOM OUT   Meperidine Hcl    Actos [Pioglitazone] Other (See Comments) and Hives   Dapagliflozin Rash   Penicillins Rash    Outpatient Encounter Medications as of 04/01/2023  Medication Sig   acetaminophen  (TYLENOL) 500 MG tablet Take 1,000 mg by mouth every 4 (four) hours as needed. Give 1000mg  by mouth every 8 hours as needed   apixaban (ELIQUIS) 5 MG TABS tablet Take 5 mg by mouth 2 (two) times daily.   brimonidine (ALPHAGAN P) 0.1 % SOLN Place 1 drop into the left eye 2 (two) times daily.    desonide (DESOWEN) 0.05 % ointment Apply to rash under eye QD-BID PRN   diltiazem (CARDIZEM CD) 120 MG 24 hr capsule Take 120 mg by mouth daily.   dorzolamide (TRUSOPT) 2 % ophthalmic solution Place 1 drop into the left eye 2 (two) times daily.   empagliflozin (JARDIANCE) 25 MG TABS tablet Take 25 mg by mouth daily.   Exenatide ER (BYDUREON BCISE) 2 MG/0.85ML AUIJ Inject 2 mg into the skin once a week.   finasteride (PROSCAR) 5 MG tablet Take 1 tablet by mouth once daily   flecainide (TAMBOCOR) 50 MG tablet Take 50 mg by mouth 2 (two) times daily.   glipiZIDE (GLUCOTROL) 10 MG tablet Take 10 mg by mouth daily.   hydrocortisone 2.5 % cream APPLY TO THE AFFECTED AREA OF  FACE AND EARS EVERY OTHER NIGHT ALTERNATING WITH KETOCONAZOLE CREAM   insulin degludec (TRESIBA FLEXTOUCH) 200 UNIT/ML FlexTouch Pen Inject 18 Units into the skin in the morning.   ketoconazole (NIZORAL) 2 % cream Apply 1 Application topically daily as needed for irritation.   loratadine (CLARITIN) 10 MG tablet Take 10 mg by mouth 2 (two) times daily.   metFORMIN (GLUCOPHAGE) 1000 MG tablet Take 1,000 mg by mouth 2 (two) times daily with a meal.   MOMETASONE FUROATE EX Apply to dermatitis topically once daily every Monday, Wednesday and Friday.   Multiple Vitamin (MULTIVITAMIN) tablet Take 1 tablet by mouth daily.   omega-3 acid ethyl esters (LOVAZA) 1 g capsule Take 2 capsules (2 g total) by mouth 2 (two) times daily.   omeprazole (PRILOSEC) 20 MG capsule Take 1 capsule (20 mg total) by mouth daily.   rosuvastatin (CRESTOR) 10 MG tablet Take 1 tablet (10 mg total) by mouth at bedtime.   tadalafil (CIALIS) 20 MG tablet Take 20 mg by mouth  daily as needed for erectile dysfunction.   telmisartan-hydrochlorothiazide (MICARDIS HCT) 80-12.5 MG tablet Take 1 tablet by mouth daily.   traZODone (DESYREL) 50 MG tablet Take 25 mg by mouth at bedtime.   No facility-administered encounter medications on file as of 04/01/2023.    Review of Systems  Constitutional:  Negative for appetite change, chills, fatigue and fever.  Respiratory:  Negative for cough, chest tightness and shortness of breath.   Cardiovascular:  Negative for chest pain and leg swelling.  Gastrointestinal:  Negative for constipation, diarrhea, nausea and vomiting.  Genitourinary:  Negative for dysuria, frequency and urgency.  Musculoskeletal:  Positive for arthralgias (right knee soreness) and joint swelling (right knee). Negative for myalgias.  Skin:  Negative for color change and pallor.  Neurological:  Negative for dizziness, weakness, light-headedness and headaches.  Psychiatric/Behavioral:  Negative for agitation and sleep disturbance. The patient is not nervous/anxious.     Immunization History  Administered Date(s) Administered   Fluad Quad(high Dose 65+) 02/28/2019, 03/04/2021, 03/05/2022   Influenza, High Dose Seasonal PF 03/12/2016, 02/22/2017, 02/25/2018   Influenza,inj,Quad PF,6+ Mos 03/28/2015   Influenza-Unspecified 02/13/2020   Moderna Sars-Covid-2 Vaccination 09/20/2020   PFIZER(Purple Top)SARS-COV-2 Vaccination 06/21/2019, 07/12/2019, 03/12/2020   Pfizer Covid-19 Vaccine Bivalent Booster 35yrs & up 02/28/2021, 03/29/2022   Pneumococcal Conjugate-13 08/07/2013, 09/04/2013   Pneumococcal Polysaccharide-23 04/04/2012   Rotavirus,unspecified  05/22/2022   Tdap 04/04/2012, 03/29/2022   Zoster Recombinant(Shingrix) 12/14/2017, 02/21/2018   Zoster, Live 09/17/2010   Pertinent  Health Maintenance Due  Topic Date Due   HEMOGLOBIN A1C  04/09/2023   FOOT EXAM  04/10/2023   OPHTHALMOLOGY EXAM  10/29/2023   Colonoscopy  02/01/2024   INFLUENZA VACCINE   Completed      07/24/2022    8:11 AM 10/05/2022    3:30 PM 12/07/2022    1:24 PM 01/06/2023    2:23 PM 01/25/2023   11:43 AM  Fall Risk  Falls in the past year? 0 0 0 0 0  Was there an injury with Fall? 0 0 0  0  Fall Risk Category Calculator 0 0 0  0  Patient at Risk for Falls Due to No Fall Risks Impaired balance/gait Impaired balance/gait Impaired balance/gait No Fall Risks  Fall risk Follow up Falls prevention discussed;Education provided;Falls evaluation completed  Falls prevention discussed Falls prevention discussed;Education provided Falls prevention discussed;Education provided;Falls evaluation completed   Functional Status Survey:    Vitals:   04/01/23 1020  BP: 122/84  Pulse: 92  Resp: 18  Temp: (!) 97.5 F (36.4 C)  SpO2: 93%  Weight: 83.3 kg  Height: 6\' 3"  (1.905 m)   Body mass index is 22.95 kg/m. Physical Exam Constitutional:      Appearance: Normal appearance. He is normal weight.  Cardiovascular:     Rate and Rhythm: Normal rate. Rhythm irregular.     Pulses: Normal pulses.  Pulmonary:     Effort: Pulmonary effort is normal. No respiratory distress.     Breath sounds: Normal breath sounds.  Abdominal:     General: Bowel sounds are normal.     Palpations: Abdomen is soft.  Musculoskeletal:        General: Swelling (right knee) and tenderness (right knee) present.     Right lower leg: No edema.     Left lower leg: No edema.  Skin:    General: Skin is warm and dry.  Neurological:     General: No focal deficit present.     Mental Status: He is alert and oriented to person, place, and time. Mental status is at baseline.  Psychiatric:        Mood and Affect: Mood normal.        Behavior: Behavior normal.     Labs reviewed: Recent Labs    06/03/22 1404 07/24/22 1018  NA 142 134*  K 4.9 3.8  CL 105 99  CO2 31 23  GLUCOSE 149* 95  BUN 19 21  CREATININE 0.68* 0.84  CALCIUM 9.1 8.5*   Recent Labs    06/03/22 1404 07/24/22 1018  AST 24 45*   ALT 25 42  ALKPHOS  --  62  BILITOT 1.0 1.7*  PROT 6.4 7.0  ALBUMIN  --  4.0   Recent Labs    12/22/22 1351 01/25/23 1322 02/22/23 1535 03/11/23 0000  WBC 4.2 4.1 3.7* 3.9  NEUTROABS 2.2 2,390 1,994 1,997.00  HGB 13.1 13.0* 13.5 11.6*  HCT 40.3 38.2* 39.9 35*  MCV 108.3* 101.6* 100.0  --   PLT 112* 101* 119* 141*   Lab Results  Component Value Date   TSH 1.42 12/05/2015   Lab Results  Component Value Date   HGBA1C 9.0 10/08/2022   Lab Results  Component Value Date   CHOL 68 06/03/2022   HDL 41 06/03/2022   LDLCALC 11 06/03/2022   TRIG 75 06/03/2022   CHOLHDL 1.7 06/03/2022    Significant Diagnostic Results in last 30 days:  No results found.  Assessment/Plan 1. Type 2 diabetes mellitus with microalbuminuria, with long-term current use of insulin (HCC) -Having some low blood sugars, not symptomatic, but alerted by continuous glucose monitor -Decrease Tresiba from 18 units to 15 units at bedtime and continue to monitor for hypoglycemia.  -Continue on bydureon, Jardiance, glipizide, metformin at previous doses -if continues to have lows would dc glipizide due to risk of hypoglycemia with medication.  -Encouraged dietary modifications (decrease sugar intake) and physical activity as tolerated  2. Hypertension, benign -Controlled, at goal, <140/90 -Encouraged dietary modifications/DASH diet and physical activity as tolerated -Continue current medication regimen  3. Anemia, unspecified type -Not symptomatic, no signs of bleeding -Follow CBC for hemoglobin  4. Benign prostatic hyperplasia, unspecified whether lower urinary tract symptoms present -Symptoms controlled -Continue finasteride  5. Persistent atrial fibrillation (HCC) -Rate controlled; not symptomatic, no bradycardia -Continue Cardizem and apixaban; no signs of bleeding -Follow-up with cardiology on Monday  6. Primary osteoarthritis of right knee -s/p right total knee arthroplasty 9/19 -Pain  controlled, PRN tylenol -Continue PT and ortho recommendations (RICE) -Scheduled follow-up 10/28  7. Mixed hyperlipidemia -Continue Crestor -Encouraged dietary modifications and physical activity as tolerated  Jama Flavors FNP student   I personally was present during the history, physical exam and medical decision-making activities of this service and have verified that the service and findings are accurately documented in the student's note Leana Springston K. Biagio [facility] [phone]

## 2023-04-05 DIAGNOSIS — E1142 Type 2 diabetes mellitus with diabetic polyneuropathy: Secondary | ICD-10-CM | POA: Diagnosis not present

## 2023-04-05 DIAGNOSIS — Z23 Encounter for immunization: Secondary | ICD-10-CM | POA: Diagnosis not present

## 2023-04-05 DIAGNOSIS — I4819 Other persistent atrial fibrillation: Secondary | ICD-10-CM | POA: Diagnosis not present

## 2023-04-05 DIAGNOSIS — B351 Tinea unguium: Secondary | ICD-10-CM | POA: Diagnosis not present

## 2023-04-07 ENCOUNTER — Telehealth: Payer: Self-pay

## 2023-04-07 NOTE — Telephone Encounter (Signed)
Copied from CRM [id number]. Topic: General - Other >> Apr 07, 2023 12:58 PM Everette C wrote: Reason for CRM: The patient has called to share that they have recently had a right knee replacement surgery   The patient estimates that they'll be discharged on 04/12/23 and would like to request an order for Physical Therapy at [facility]   Please contact the patient further when possible

## 2023-04-07 NOTE — Telephone Encounter (Signed)
Spoke with patient and he stated he goes back to the surgeon on the 28th and will talk with him then but that they already told him that his PCP would have to place referral for addition PT after he is d/c'd from [facility] (he hopes on the 28th) and he was just trying to get everything in order beforehand.

## 2023-04-07 NOTE — Telephone Encounter (Signed)
Spoke with patient and he stated he goes back to the surgeon on the 28th and will talk with him then but that they already told him that his PCP would have to place referral for addition PT after he is d/c'd from [facility] (he hopes on the 28th) and he was just trying to get everything in order beforehand. Fwd message to Dr. Carlynn Purl.

## 2023-04-08 ENCOUNTER — Encounter: Payer: Self-pay | Admitting: *Deleted

## 2023-04-08 ENCOUNTER — Emergency Department: Payer: Medicare Other

## 2023-04-08 ENCOUNTER — Other Ambulatory Visit: Payer: Self-pay

## 2023-04-08 ENCOUNTER — Emergency Department
Admission: EM | Admit: 2023-04-08 | Discharge: 2023-04-08 | Disposition: A | Discharge status: Home / Self Care | Payer: Medicare Other | Attending: Emergency Medicine | Admitting: Emergency Medicine

## 2023-04-08 ENCOUNTER — Non-Acute Institutional Stay (SKILLED_NURSING_FACILITY): Payer: Medicare Other | Admitting: Nurse Practitioner

## 2023-04-08 ENCOUNTER — Encounter: Payer: Self-pay | Admitting: Nurse Practitioner

## 2023-04-08 DIAGNOSIS — R1011 Right upper quadrant pain: Secondary | ICD-10-CM | POA: Insufficient documentation

## 2023-04-08 DIAGNOSIS — I10 Essential (primary) hypertension: Secondary | ICD-10-CM | POA: Diagnosis not present

## 2023-04-08 DIAGNOSIS — E119 Type 2 diabetes mellitus without complications: Secondary | ICD-10-CM | POA: Insufficient documentation

## 2023-04-08 LAB — CBC WITH DIFFERENTIAL/PLATELET
Abs Immature Granulocytes: 0.03 10*3/uL (ref 0.00–0.07)
Basophils Absolute: 0 10*3/uL (ref 0.0–0.1)
Basophils Relative: 0 %
Eosinophils Absolute: 0.1 10*3/uL (ref 0.0–0.5)
Eosinophils Relative: 1 %
HCT: 40.1 % (ref 39.0–52.0)
Hemoglobin: 13.2 g/dL (ref 13.0–17.0)
Immature Granulocytes: 1 %
Lymphocytes Relative: 25 %
Lymphs Abs: 1.3 10*3/uL (ref 0.7–4.0)
MCH: 32.8 pg (ref 26.0–34.0)
MCHC: 32.9 g/dL (ref 30.0–36.0)
MCV: 99.8 fL (ref 80.0–100.0)
Monocytes Absolute: 1 10*3/uL (ref 0.1–1.0)
Monocytes Relative: 20 %
Neutro Abs: 2.6 10*3/uL (ref 1.7–7.7)
Neutrophils Relative %: 53 %
Platelets: 133 10*3/uL — ABNORMAL LOW (ref 150–400)
RBC: 4.02 MIL/uL — ABNORMAL LOW (ref 4.22–5.81)
RDW: 16.5 % — ABNORMAL HIGH (ref 11.5–15.5)
WBC: 4.9 10*3/uL (ref 4.0–10.5)
nRBC: 0 % (ref 0.0–0.2)

## 2023-04-08 LAB — COMPREHENSIVE METABOLIC PANEL
ALT: 11 U/L (ref 0–44)
AST: 16 U/L (ref 15–41)
Albumin: 3.9 g/dL (ref 3.5–5.0)
Alkaline Phosphatase: 62 U/L (ref 38–126)
Anion gap: 12 (ref 5–15)
BUN: 17 mg/dL (ref 8–23)
CO2: 24 mmol/L (ref 22–32)
Calcium: 9 mg/dL (ref 8.9–10.3)
Chloride: 99 mmol/L (ref 98–111)
Creatinine, Ser: 0.66 mg/dL (ref 0.61–1.24)
GFR, Estimated: >60 mL/min (ref >60)
Glucose, Bld: 120 mg/dL — ABNORMAL HIGH (ref 70–99)
Potassium: 4.2 mmol/L (ref 3.5–5.1)
Sodium: 135 mmol/L (ref 135–145)
Total Bilirubin: 1 mg/dL (ref 0.3–1.2)
Total Protein: 7.8 g/dL (ref 6.5–8.1)

## 2023-04-08 LAB — LIPASE, BLOOD: Lipase: 33 U/L (ref 11–51)

## 2023-04-08 NOTE — Discharge Instructions (Signed)
For evaluation the emergency department did not show any emergency conditions to account for your right upper quadrant abdominal pain.  Specifically there were no gallbladder stones or infection noted and your biliary ducts were normal size.  I wonder if this is related to the liver lesion that was found last year.  Please follow-up with your doctor for further evaluation and treatment as needed.  Thank you for choosing [place] for your health care today!  Please see your primary doctor this week for a follow up appointment.   If you have any new, worsening, or unexpected symptoms call your doctor right away or come back to the emergency department for reevaluation.  It was my pleasure to care for you today.   Daneil Dan Modesto Charon, MD

## 2023-04-08 NOTE — ED Notes (Signed)
PATIENT WHEELED IN A WHEELCHAIR TO THE RESTROOM BY FAMILY MEMBER AT THIS TIME

## 2023-04-08 NOTE — ED Triage Notes (Addendum)
Here by POV with family, coming from [facility] rehab, there for recent total R knee replacement 5 weeks ago (doing well, to be d/c'd tomorrow), here for RUQ abd pain, onset 2-3days ago, last BM yesterday and today, denies any other sx. Denies fever, NVD, constipation or other sx. Alert, NAD, calm, interactive. Rates pain 5/10. Worse with standing. Pulse irregular, takes cardizem and flecanide.

## 2023-04-08 NOTE — ED Provider Notes (Signed)
[facility] Provider Note    Event Date/Time   First MD Initiated Contact with Patient 04/08/23 1931     (approximate)   History   Abdominal Pain   HPI  HOBSON SHOENFELT is a 87 y.o. male   Past medical history of diabetes, hypertension hyperlipidemia, multiple abdominal surgeries, small bowel obstruction, colectomy, appendectomy here with right upper quadrant pain for the last several days.    Had known liver mass worked up last year, on surveillance.    No associated nausea or fever.    No chest pain. Bowel movements and flatus have been normal. No urinary symptoms.   Independent Historian contributed to assessment above: His son is at bedside to corroborate information past medical history as above    Physical Exam   Triage Vital Signs: ED Triage Vitals  Encounter Vitals Group     BP 04/08/23 1431 128/83     Systolic BP Percentile --      Diastolic BP Percentile --      Pulse Rate 04/08/23 1431 85     Resp 04/08/23 1431 18     Temp 04/08/23 1431 98.2 F (36.8 C)     Temp Source 04/08/23 1431 Oral     SpO2 04/08/23 1431 96 %     Weight 04/08/23 1432 184 lb (83.5 kg)     Height --      Head Circumference --      Peak Flow --      Pain Score 04/08/23 1432 5     Pain Loc --      Pain Education --      Exclude from Growth Chart --     Most recent vital signs: Vitals:   04/08/23 1957 04/08/23 2259  BP: (!) 174/106 (!) 140/87  Pulse: 97 97  Resp: 16 16  Temp: 98 F (36.7 C)   SpO2: 96% 100%    General: Awake, no distress.  CV:  Good peripheral perfusion.  Resp:  Normal effort.  Abd:  No distention.  Other:  Well-appearing comfortable patient no acute distress with normal vital signs afebrile.  He does have some mild right upper quadrant tenderness to palpation only, without rigidity or guarding or no tenderness to any of the other quadrants no CVA tenderness.   ED Results / Procedures / Treatments   Labs (all labs ordered  are listed, but only abnormal results are displayed) Labs Reviewed  COMPREHENSIVE METABOLIC PANEL - Abnormal; Notable for the following components:      Result Value   Glucose, Bld 120 (*)    All other components within normal limits  CBC WITH DIFFERENTIAL/PLATELET - Abnormal; Notable for the following components:   RBC 4.02 (*)    RDW 16.5 (*)    Platelets 133 (*)    All other components within normal limits  LIPASE, BLOOD     I ordered and reviewed the above labs they are notable for no leukocytosis   RADIOLOGY I independently reviewed and interpreted right upper quadrant ultrasound shows no signs of cholecystitis I also reviewed radiologist's formal read.   PROCEDURES:  Critical Care performed: No  Procedures   MEDICATIONS ORDERED IN ED: Medications - No data to display  IMPRESSION / MDM / ASSESSMENT AND PLAN / ED COURSE  I reviewed the triage vital signs and the nursing notes.  Patient's presentation is most consistent with acute presentation with potential threat to life or bodily function.  Differential diagnosis includes, but is not limited to, cholecystitis, liver disease, choledocholithiasis, pancreatitis, GERD considered but less likely mesenteric ischemia, obstruction, ACS   The patient is on the cardiac monitor to evaluate for evidence of arrhythmia and/or significant heart rate changes.  MDM:    Right upper quadrant ultrasound is negative for cholelithiasis cholecystitis or choledocholithiasis.  Labs unremarkable including lipase for consideration of pancreatitis.  I doubt obstruction given he has no obstructive symptoms has passing flatus and bowel movements like normal.  I doubt mesenteric ischemia given no pain out of proportion, anticoagulated.  No chest pain doubtful for ACS.  I am not sure what is causing his pain though given workup as above I doubt there is a intra-abdominal emergency at this time.  He will follow-up  with his PMD and GI as scheduled.  He understands to return with new or worsening symptoms.       FINAL CLINICAL IMPRESSION(S) / ED DIAGNOSES   Final diagnoses:  Right upper quadrant abdominal pain     Rx / DC Orders   ED Discharge Orders     None        Note:  This document was prepared using Dragon voice recognition software and may include unintentional dictation errors.    Pilar Jarvis, MD 04/09/23 [phone]

## 2023-04-08 NOTE — Progress Notes (Signed)
Location:  Other [facility]) Nursing Home Room Number: 104 A Place of Service:  SNF [phone])  Provider: Janene Harvey. Janyth Contes, NP   PCP: Alba Cory, MD Patient Care Team: Alba Cory, MD as PCP - General (Family Medicine) Requested, Self Galen Manila, MD as Consulting Physician (Ophthalmology) Deeann Saint, MD as Consulting Physician (Orthopedic Surgery) Marcina Millard, MD as Consulting Physician (Cardiology) Sherlon Handing, MD as Consulting Physician (Internal Medicine) Linus Galas, [place] (Podiatry) Deirdre Evener, MD (Dermatology) Jeralyn Ruths, MD as Consulting Physician (Oncology)  Extended Emergency Contact Information Primary Emergency Contact: Brennan Bailey, [place] Phone: [phone] Relation: Daughter Secondary Emergency Contact: Baird Kay, MD [place] of [place] Mobile Phone: [phone] Relation: Son  Code Status: Full Code  Goals of care:  Advanced Directive information    04/08/2023   10:15 AM  Advanced Directives  Does Patient Have a Medical Advance Directive? Yes  Type of Estate agent of [place];Living will  Does patient want to make changes to medical advance directive? No - Patient declined  Copy of Healthcare Power of Attorney in Chart? Yes - validated most recent copy scanned in chart (See row information)     Allergies  Allergen Reactions   Demerol [Meperidine] Other (See Comments) and Anaphylaxis    MAKES BLOOD PRESSURE BOTTOM OUT hypotension   Dilaudid [Hydromorphone Hcl]    Hydromorphone     Other reaction(s): Unknown MAKES BLOOD PRESSURE BOTTOM OUT   Meperidine Hcl    Actos [Pioglitazone] Other (See Comments) and Hives   Dapagliflozin Rash   Penicillins Rash    Chief Complaint  Patient presents with   Acute Visit    Abdominal pain    HPI:  87 y.o. male  being seen today orginally for discharge home  however patient reports worsening right upper quadrant pain. States he had no pain until 2-3 days ago and has gradually gotten worse and woke him up in the middle of the night.  Thought it was related to gas pain but has not improved.  No constipation or diarrhea. Reports usual BM.  No fever or chills.  Denies GERD Reports pain does not worsen with eating or drinking.  Has done PT without exacerbation in pain.  Denies shortness of breath, palpitations or chest pains.     Past Medical History:  Diagnosis Date   Allergy    Basal cell carcinoma 10/09/2021   right medial forehead at glabella, Mohs 01/22/22   Chronic kidney disease    Diabetes mellitus without complication (HCC)    Diabetic neuropathy (HCC)    Diverticula, colon 1980's   Diverticulosis    Enlarged prostate    GERD (gastroesophageal reflux disease)    Glaucoma    Hemorrhoids    High cholesterol    History of hiatal hernia    Hyperlipidemia    Hypertension    Obesity    Onychomycosis    Seizures (HCC)    Small bowel obstruction (HCC) 1990's   Urination disorder     Past Surgical History:  Procedure Laterality Date   APPENDECTOMY     BLADDER DIVERTICULECTOMY  2013   CARDIOVERSION N/A 02/24/2023   Procedure: CARDIOVERSION;  Surgeon: Marcina Millard, MD;  Location: [facility] ORS;  Service: Cardiovascular;  Laterality: N/A;   CARPAL TUNNEL RELEASE  01/2022   revision right side   COLON SURGERY Left 1980's  COLONOSCOPY W/ POLYPECTOMY  1990's   Dr Okey Dupre   COLONOSCOPY WITH PROPOFOL N/A 12/21/2014   Procedure: COLONOSCOPY WITH PROPOFOL;  Surgeon: Christena Deem, MD;  Location: [facility] ENDOSCOPY;  Service: Endoscopy;  Laterality: N/A;   COLONOSCOPY WITH PROPOFOL N/A 08/16/2017   Procedure: COLONOSCOPY WITH PROPOFOL;  Surgeon: Christena Deem, MD;  Location: [facility] ENDOSCOPY;  Service: Endoscopy;  Laterality: N/A;   COLONOSCOPY WITH PROPOFOL N/A 01/31/2021   Procedure: COLONOSCOPY WITH PROPOFOL;  Surgeon:  Earline Mayotte, MD;  Location: [facility] ENDOSCOPY;  Service: Endoscopy;  Laterality: N/A;   ESOPHAGOGASTRODUODENOSCOPY (EGD) WITH PROPOFOL N/A 02/15/2015   Procedure: ESOPHAGOGASTRODUODENOSCOPY (EGD) WITH PROPOFOL;  Surgeon: Christena Deem, MD;  Location: [facility] ENDOSCOPY;  Service: Endoscopy;  Laterality: N/A;   ESOPHAGOGASTRODUODENOSCOPY (EGD) WITH PROPOFOL N/A 06/10/2017   Procedure: ESOPHAGOGASTRODUODENOSCOPY (EGD) WITH PROPOFOL;  Surgeon: Christena Deem, MD;  Location: [facility] ENDOSCOPY;  Service: Endoscopy;  Laterality: N/A;   ESOPHAGOGASTRODUODENOSCOPY (EGD) WITH PROPOFOL N/A 08/16/2017   Procedure: ESOPHAGOGASTRODUODENOSCOPY (EGD) WITH PROPOFOL;  Surgeon: Christena Deem, MD;  Location: [facility] ENDOSCOPY;  Service: Endoscopy;  Laterality: N/A;   ESOPHAGOGASTRODUODENOSCOPY (EGD) WITH PROPOFOL N/A 01/31/2021   Procedure: ESOPHAGOGASTRODUODENOSCOPY (EGD) WITH PROPOFOL;  Surgeon: Earline Mayotte, MD;  Location: [facility] ENDOSCOPY;  Service: Endoscopy;  Laterality: N/A;   EYE SURGERY     GLAUCOMA SURGERY Left 1990's   [facility]   HEMORROIDECTOMY  1980's   [facility]   HERNIA REPAIR     [facility], Dr Carol Ada HERNIA REPAIR Bilateral [phone]   LARYNX SURGERY  1990   3 times   MOHS SURGERY  01/22/2022   Dr. Verdell Face - [facility]   RETINAL DETACHMENT SURGERY Right    TONSILLECTOMY     turp  2013   URETER SURGERY  2013   Dr Achilles Dunk      reports that he has never smoked. He has never used smokeless tobacco. He reports that he does not drink alcohol and does not use drugs. 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)   Tobacco Use   Smoking status: Never   Smokeless tobacco: Never    Tobacco comments:    smoking cessation materials not required  Vaping Use   Vaping status: Never Used  Substance and Sexual Activity   Alcohol use: No   Drug use: No   Sexual activity: Yes  Other Topics Concern   Not on file  Social History Narrative   Married , still works as a Development worker, community Garment/textile technologist)   Three children ( one died in his 18's with colon cancer) , one lives in [place] and the other one in [place]   His siblings are in [place] and [place]   Social Determinants of Health   Financial Resource Strain: Low Risk  (03/04/2021)   Overall Financial Resource Strain (CARDIA)    Difficulty of Paying Living Expenses: Not hard at all  Food Insecurity: No Food Insecurity (03/05/2022)   Hunger Vital Sign    Worried About Running Out of Food in the Last Year: Never true    Ran Out of Food in the Last Year: Never true  Transportation Needs: No Transportation  Needs (03/04/2021)   PRAPARE - Administrator, Civil Service (Medical): No    Lack of Transportation (Non-Medical): No  Physical Activity: Inactive (03/05/2022)   Exercise Vital Sign    Days of Exercise per Week: 0 days    Minutes of Exercise per Session: 0 min  Stress: No Stress Concern Present (03/05/2022)   [employer] of Occupational Health - Occupational Stress Questionnaire    Feeling of Stress : Not at all  Social Connections: Socially Integrated (03/04/2021)   Social Connection and Isolation Panel [NHANES]    Frequency of Communication with Friends and Family: More than three times a week    Frequency of Social Gatherings with Friends and Family: Once a week    Attends Religious Services: More than 4 times per year    Active Member of [employer] or Organizations: Yes    Attends Banker Meetings: Never    Marital Status: Married  Catering manager Violence: Not At Risk (03/05/2022)   Humiliation, Afraid, Rape, and Kick questionnaire    Fear of Current or Ex-Partner: No    Emotionally Abused: No    Physically  Abused: No    Sexually Abused: No   Functional Status Survey:    Allergies  Allergen Reactions   Demerol [Meperidine] Other (See Comments) and Anaphylaxis    MAKES BLOOD PRESSURE BOTTOM OUT hypotension   Dilaudid [Hydromorphone Hcl]    Hydromorphone     Other reaction(s): Unknown MAKES BLOOD PRESSURE BOTTOM OUT   Meperidine Hcl    Actos [Pioglitazone] Other (See Comments) and Hives   Dapagliflozin Rash   Penicillins Rash    Pertinent  Health Maintenance Due  Topic Date Due   HEMOGLOBIN A1C  04/09/2023   FOOT EXAM  04/10/2023   OPHTHALMOLOGY EXAM  10/29/2023   Colonoscopy  02/01/2024   INFLUENZA VACCINE  Completed    Medications: Outpatient Encounter Medications as of 04/08/2023  Medication Sig   acetaminophen (TYLENOL) 325 MG tablet Take 650 mg by mouth every 4 (four) hours as needed.   apixaban (ELIQUIS) 5 MG TABS tablet Take 5 mg by mouth 2 (two) times daily.   brimonidine (ALPHAGAN P) 0.1 % SOLN Place 1 drop into the left eye 2 (two) times daily.    desonide (DESOWEN) 0.05 % ointment Apply 1 Application topically daily. As needed for dry skin   diltiazem (CARDIZEM CD) 120 MG 24 hr capsule Take 120 mg by mouth daily.   dorzolamide (TRUSOPT) 2 % ophthalmic solution Place 1 drop into the left eye 2 (two) times daily.   empagliflozin (JARDIANCE) 25 MG TABS tablet Take 25 mg by mouth daily.   Exenatide ER (BYDUREON BCISE) 2 MG/0.85ML AUIJ Inject 2 mg into the skin once a week.   finasteride (PROSCAR) 5 MG tablet Take 1 tablet by mouth once daily   flecainide (TAMBOCOR) 50 MG tablet Take 50 mg by mouth 2 (two) times daily.   glipiZIDE (GLUCOTROL) 10 MG tablet Take 10 mg by mouth daily.   hydrocortisone 2.5 % cream APPLY TO THE AFFECTED AREA OF FACE AND EARS EVERY OTHER NIGHT ALTERNATING WITH KETOCONAZOLE CREAM   insulin degludec (TRESIBA FLEXTOUCH) 200 UNIT/ML FlexTouch Pen Inject 15 Units into the skin in the morning.   ketoconazole (NIZORAL) 2 % cream Apply 1 Application  topically daily as needed for irritation.   loratadine (CLARITIN) 10 MG tablet Take 10 mg by mouth 2 (two) times daily.   metFORMIN (GLUCOPHAGE) 1000 MG tablet Take 1,000 mg by  mouth 2 (two) times daily with a meal. On Monday and Wednesday   MOMETASONE FUROATE EX Apply to dermatitis topically once daily every Monday, Wednesday and Friday.   Multiple Vitamin (MULTIVITAMIN) tablet Take 1 tablet by mouth daily.   omega-3 acid ethyl esters (LOVAZA) 1 g capsule Take 2 capsules (2 g total) by mouth 2 (two) times daily.   omeprazole (PRILOSEC) 20 MG capsule Take 1 capsule (20 mg total) by mouth daily.   rosuvastatin (CRESTOR) 10 MG tablet Take 1 tablet (10 mg total) by mouth at bedtime.   tadalafil (CIALIS) 20 MG tablet Take 20 mg by mouth daily as needed for erectile dysfunction.   telmisartan-hydrochlorothiazide (MICARDIS HCT) 80-12.5 MG tablet Take 1 tablet by mouth daily.   traZODone (DESYREL) 50 MG tablet Take 25 mg by mouth at bedtime.   acetaminophen (TYLENOL) 500 MG tablet Take 1,000 mg by mouth every 4 (four) hours as needed. Give 1000mg  by mouth every 8 hours as needed (Patient not taking: Reported on 04/08/2023)   desonide (DESOWEN) 0.05 % ointment Apply to rash under eye QD-BID PRN (Patient not taking: Reported on 04/08/2023)   No facility-administered encounter medications on file as of 04/08/2023.    Review of Systems  Constitutional:  Negative for activity change, appetite change, chills, diaphoresis, fatigue and unexpected weight change.  HENT:  Negative for congestion and hearing loss.   Eyes: Negative.   Respiratory:  Negative for cough and shortness of breath.   Cardiovascular:  Negative for chest pain, palpitations and leg swelling.  Gastrointestinal:  Positive for abdominal pain. Negative for constipation and diarrhea.  Genitourinary:  Negative for difficulty urinating and dysuria.  Musculoskeletal:  Negative for arthralgias and myalgias.  Skin:  Negative for color change and  wound.  Neurological:  Negative for dizziness and weakness.  Psychiatric/Behavioral:  Negative for agitation, behavioral problems and confusion.     Vitals:   04/08/23 1013  BP: 112/70  Pulse: 85  Resp: 18  Temp: 97.6 F (36.4 C)  SpO2: 95%  Weight: 186 lb (84.4 kg)  Height: 6\' 3"  (1.905 m)   Body mass index is 23.25 kg/m. Physical Exam Constitutional:      General: He is not in acute distress.    Appearance: He is well-developed. He is not diaphoretic.  HENT:     Head: Normocephalic and atraumatic.     Right Ear: External ear normal.     Left Ear: External ear normal.     Mouth/Throat:     Pharynx: No oropharyngeal exudate.  Eyes:     Conjunctiva/sclera: Conjunctivae normal.     Pupils: Pupils are equal, round, and reactive to light.  Cardiovascular:     Rate and Rhythm: Normal rate and regular rhythm.     Heart sounds: Normal heart sounds.  Pulmonary:     Effort: Pulmonary effort is normal.     Breath sounds: Normal breath sounds.  Abdominal:     General: Bowel sounds are normal. There is no distension.     Palpations: Abdomen is soft. There is no mass.     Tenderness: There is abdominal tenderness (noted to right upper quadrant). There is no guarding or rebound.     Hernia: No hernia is present.  Musculoskeletal:     Cervical back: Normal range of motion and neck supple.  Skin:    General: Skin is warm and dry.  Neurological:     Mental Status: He is alert and oriented to person, place, and time.  Labs reviewed: Basic Metabolic Panel: Recent Labs    06/03/22 1404 07/24/22 1018  NA 142 134*  K 4.9 3.8  CL 105 99  CO2 31 23  GLUCOSE 149* 95  BUN 19 21  CREATININE 0.68* 0.84  CALCIUM 9.1 8.5*   Liver Function Tests: Recent Labs    06/03/22 1404 07/24/22 1018  AST 24 45*  ALT 25 42  ALKPHOS  --  62  BILITOT 1.0 1.7*  PROT 6.4 7.0  ALBUMIN  --  4.0   Recent Labs    07/24/22 1018  LIPASE 33   No results for input(s): "AMMONIA" in the  last 8760 hours. CBC: Recent Labs    12/22/22 1351 01/25/23 1322 02/22/23 1535 03/11/23 0000  WBC 4.2 4.1 3.7* 3.9  NEUTROABS 2.2 2,390 1,994 1,997.00  HGB 13.1 13.0* 13.5 11.6*  HCT 40.3 38.2* 39.9 35*  MCV 108.3* 101.6* 100.0  --   PLT 112* 101* 119* 141*   Cardiac Enzymes: No results for input(s): "CKTOTAL", "CKMB", "CKMBINDEX", "TROPONINI" in the last 8760 hours. BNP: Invalid input(s): "POCBNP" CBG: Recent Labs    02/24/23 0731  GLUCAP 85    Procedures and Imaging Studies During Stay: No results found.  Assessment/Plan:   1. Right upper quadrant abdominal pain - noted tenderness on exam with ongoing worsening of pain to right upper quadrant. Will get cbc, cmp, amylase, lipase and right upper quadrant [place] to evaluate Pt reports strong family hx of gallstones and cancer in family  -continue to monitor VS q 8 hrs, to notify for worsening pain or fever.    Janene Harvey. Biagio Borg [facility] [phone]    Addendum- pt did not wish to wait for labs and [place] at facility therefore family took to ED for evaluation.

## 2023-04-08 NOTE — ED Notes (Signed)
Patient c/o ruq pain that began a few days ago and patient states that today the pain woke him up out of his sleep.  Patient states that he has a history of abdominal surgeries

## 2023-04-08 NOTE — ED Notes (Signed)
MD at bedside. 

## 2023-04-08 NOTE — ED Notes (Signed)
Patient c/o mild nausea at this time. EDP aware. No new orders

## 2023-04-08 NOTE — ED Notes (Signed)
Son to take patient home to [facility]

## 2023-04-09 ENCOUNTER — Non-Acute Institutional Stay (SKILLED_NURSING_FACILITY): Payer: Self-pay | Admitting: Student

## 2023-04-09 ENCOUNTER — Encounter: Payer: Self-pay | Admitting: Student

## 2023-04-09 DIAGNOSIS — Z96659 Presence of unspecified artificial knee joint: Secondary | ICD-10-CM

## 2023-04-09 DIAGNOSIS — E1129 Type 2 diabetes mellitus with other diabetic kidney complication: Secondary | ICD-10-CM | POA: Diagnosis not present

## 2023-04-09 DIAGNOSIS — R809 Proteinuria, unspecified: Secondary | ICD-10-CM

## 2023-04-09 DIAGNOSIS — E441 Mild protein-calorie malnutrition: Secondary | ICD-10-CM

## 2023-04-09 DIAGNOSIS — Z794 Long term (current) use of insulin: Secondary | ICD-10-CM

## 2023-04-09 DIAGNOSIS — R1011 Right upper quadrant pain: Secondary | ICD-10-CM | POA: Diagnosis not present

## 2023-04-09 DIAGNOSIS — I4819 Other persistent atrial fibrillation: Secondary | ICD-10-CM | POA: Diagnosis not present

## 2023-04-09 DIAGNOSIS — I10 Essential (primary) hypertension: Secondary | ICD-10-CM | POA: Diagnosis not present

## 2023-04-09 NOTE — Progress Notes (Signed)
Location:  Other [place].  Nursing Home Room Number: [facility] 104A Place of Service:  SNF [id number]) Provider:  Dr. Earnestine Mealing  PCP: Alba Cory, MD  Patient Care Team: Alba Cory, MD as PCP - General (Family Medicine) Requested, Self Galen Manila, MD as Consulting Physician (Ophthalmology) Deeann Saint, MD as Consulting Physician (Orthopedic Surgery) Marcina Millard, MD as Consulting Physician (Cardiology) Sherlon Handing, MD as Consulting Physician (Internal Medicine) Linus Galas, DPM (Podiatry) Deirdre Evener, MD (Dermatology) Jeralyn Ruths, MD as Consulting Physician (Oncology)  Extended Emergency Contact Information Primary Emergency Contact: Brennan Bailey, [place] of [place] Phone: [phone] Relation: Daughter Secondary Emergency Contact: Baird Kay, MD [place] of [place] Home Phone: [phone] Mobile Phone: [phone] Relation: Son  Code Status:  Full Code Goals of care: Advanced Directive information    04/09/2023   11:24 AM  Advanced Directives  Does Patient Have a Medical Advance Directive? Yes  Type of Estate agent of [place];Living will  Does patient want to make changes to medical advance directive? No - Patient declined  Copy of Healthcare Power of Attorney in Chart? Yes - validated most recent copy scanned in chart (See row information)     Chief Complaint  Patient presents with   Hospitalization Follow-up    ER Follow up   Discharge Note    Discharge    HPI:  Pt is a 87 y.o. male seen today for ER Follow up and Discharge.   He had previous liver. He had some abdominal pain that woke him from sleep. Continues to have pain. No clear issue on ultrasound in the ED. Previously had an encapsulated fatty area, that is no longer characterized on his imaging.   Weight loss: Jardiance started and correlates with his  Next  colonoscopy is in 2025, previously no findings, despite his continued weight loss.   His knee is doing better. Only taking tylenol. Working well with therapy. Getting better every day. Supposed to see ortho on Monday. He is ohping he will be able to drive and will likely be discharged after that.  Patient has had numerous low fasted glucose less than 100. Discsussed extensively the risk of taking glipizide and insulin and desire to discontinue, which patient refuses until approval by his endocrinologist. Discussed risk and patient states he has an alarm that will notify which he feels comfortable with   Past Medical History:  Diagnosis Date   Allergy    Basal cell carcinoma 10/09/2021   right medial forehead at glabella, Mohs 01/22/22   Chronic kidney disease    Diabetes mellitus without complication (HCC)    Diabetic neuropathy (HCC)    Diverticula, colon 1980's   Diverticulosis    Enlarged prostate    GERD (gastroesophageal reflux disease)    Glaucoma    Hemorrhoids    High cholesterol    History of hiatal hernia    Hyperlipidemia    Hypertension    Obesity    Onychomycosis    Seizures (HCC)    Small bowel obstruction (HCC) 1990's   Urination disorder    Past Surgical History:  Procedure Laterality Date   APPENDECTOMY     BLADDER DIVERTICULECTOMY  2013   CARDIOVERSION N/A 02/24/2023   Procedure: CARDIOVERSION;  Surgeon: Marcina Millard, MD;  Location: [facility] ORS;  Service: Cardiovascular;  Laterality: N/A;   CARPAL TUNNEL RELEASE  01/2022   revision right side   COLON SURGERY Left 1980's   COLONOSCOPY W/ POLYPECTOMY  1990's   Dr Okey Dupre   COLONOSCOPY WITH PROPOFOL N/A 12/21/2014   Procedure: COLONOSCOPY WITH PROPOFOL;  Surgeon: Christena Deem, MD;  Location: [facility] ENDOSCOPY;  Service: Endoscopy;  Laterality: N/A;   COLONOSCOPY WITH PROPOFOL N/A 08/16/2017   Procedure: COLONOSCOPY WITH PROPOFOL;  Surgeon: Christena Deem, MD;  [facility];  Service:  Endoscopy;  Laterality: N/A;   COLONOSCOPY WITH PROPOFOL N/A 01/31/2021   Procedure: COLONOSCOPY WITH PROPOFOL;  Surgeon: Earline Mayotte, MD;  Location: [facility] ENDOSCOPY;  Service: Endoscopy;  Laterality: N/A;   ESOPHAGOGASTRODUODENOSCOPY (EGD) WITH PROPOFOL N/A 02/15/2015   Procedure: ESOPHAGOGASTRODUODENOSCOPY (EGD) WITH PROPOFOL;  Surgeon: Christena Deem, MD;  Location: [facility] ENDOSCOPY;  Service: Endoscopy;  Laterality: N/A;   ESOPHAGOGASTRODUODENOSCOPY (EGD) WITH PROPOFOL N/A 06/10/2017   Procedure: ESOPHAGOGASTRODUODENOSCOPY (EGD) WITH PROPOFOL;  Surgeon: Christena Deem, MD;  Location: [facility] ENDOSCOPY;  Service: Endoscopy;  Laterality: N/A;   ESOPHAGOGASTRODUODENOSCOPY (EGD) WITH PROPOFOL N/A 08/16/2017   Procedure: ESOPHAGOGASTRODUODENOSCOPY (EGD) WITH PROPOFOL;  Surgeon: Christena Deem, MD;  Location: [facility] ENDOSCOPY;  Service: Endoscopy;  Laterality: N/A;   ESOPHAGOGASTRODUODENOSCOPY (EGD) WITH PROPOFOL N/A 01/31/2021   Procedure: ESOPHAGOGASTRODUODENOSCOPY (EGD) WITH PROPOFOL;  Surgeon: Earline Mayotte, MD;  Location: [facility] ENDOSCOPY;  Service: Endoscopy;  Laterality: N/A;   EYE SURGERY     GLAUCOMA SURGERY Left 1990's   [facility]   HEMORROIDECTOMY  [phone]   [facility]   HERNIA REPAIR     [facility], Dr Carol Ada HERNIA REPAIR Bilateral [phone]   LARYNX SURGERY  1990   3 times   MOHS SURGERY  01/22/2022   Dr. Verdell Face - [facility]   RETINAL DETACHMENT SURGERY Right    TONSILLECTOMY     turp  2013   URETER SURGERY  2013   Dr Achilles Dunk    Allergies  Allergen Reactions   Demerol [Meperidine] Other (See Comments) and Anaphylaxis    MAKES BLOOD PRESSURE BOTTOM OUT hypotension   Dilaudid [Hydromorphone Hcl]    Hydromorphone     Other reaction(s): Unknown MAKES BLOOD PRESSURE BOTTOM OUT   Meperidine Hcl    Actos [Pioglitazone] Other (See Comments) and Hives   Dapagliflozin Rash   Penicillins Rash    Outpatient Encounter Medications as of 04/09/2023   Medication Sig   acetaminophen (TYLENOL) 325 MG tablet Take 650 mg by mouth every 4 (four) hours as needed.   apixaban (ELIQUIS) 5 MG TABS tablet Take 5 mg by mouth 2 (two) times daily.   brimonidine (ALPHAGAN P) 0.1 % SOLN Place 1 drop into the left eye 2 (two) times daily.    desonide (DESOWEN) 0.05 % ointment Apply 1 Application topically daily. As needed for dry skin   diltiazem (CARDIZEM CD) 120 MG 24 hr capsule Take 120 mg by mouth daily.   dorzolamide (TRUSOPT) 2 % ophthalmic solution Place 1 drop into the left eye 2 (two) times daily.   empagliflozin (JARDIANCE) 25 MG TABS tablet Take 25 mg by mouth daily.   Exenatide ER (BYDUREON BCISE) 2 MG/0.85ML AUIJ Inject 2 mg into the skin once a week.   finasteride (PROSCAR) 5 MG tablet Take 1 tablet by mouth once daily   flecainide (TAMBOCOR) 50 MG tablet Take 50 mg by mouth 2 (two) times daily.   glipiZIDE (GLUCOTROL) 10 MG tablet Take 10 mg by mouth daily.   hydrocortisone 2.5 % cream APPLY TO THE AFFECTED  AREA OF FACE AND EARS EVERY OTHER NIGHT ALTERNATING WITH KETOCONAZOLE CREAM   insulin degludec (TRESIBA FLEXTOUCH) 200 UNIT/ML FlexTouch Pen Inject 15 Units into the skin in the morning.   ketoconazole (NIZORAL) 2 % cream Apply 1 Application topically daily as needed for irritation.   loratadine (CLARITIN) 10 MG tablet Take 10 mg by mouth 2 (two) times daily.   metFORMIN (GLUCOPHAGE) 1000 MG tablet Take 1,000 mg by mouth 2 (two) times daily with a meal. On Monday and Wednesday   MOMETASONE FUROATE EX Apply to dermatitis topically once daily every Monday, Wednesday and Friday.   Multiple Vitamin (MULTIVITAMIN) tablet Take 1 tablet by mouth daily.   omega-3 acid ethyl esters (LOVAZA) 1 g capsule Take 2 capsules (2 g total) by mouth 2 (two) times daily.   omeprazole (PRILOSEC) 20 MG capsule Take 20 mg by mouth 2 (two) times daily before a meal.   rosuvastatin (CRESTOR) 10 MG tablet Take 1 tablet (10 mg total) by mouth at bedtime.   tadalafil  (CIALIS) 20 MG tablet Take 20 mg by mouth daily as needed for erectile dysfunction.   telmisartan-hydrochlorothiazide (MICARDIS HCT) 80-12.5 MG tablet Take 1 tablet by mouth daily.   traZODone (DESYREL) 50 MG tablet Take 25 mg by mouth at bedtime.   [DISCONTINUED] omeprazole (PRILOSEC) 20 MG capsule Take 1 capsule (20 mg total) by mouth daily. (Patient taking differently: Take 20 mg by mouth 2 (two) times daily before a meal.)   [DISCONTINUED] acetaminophen (TYLENOL) 500 MG tablet Take 1,000 mg by mouth every 4 (four) hours as needed. Give 1000mg  by mouth every 8 hours as needed (Patient not taking: Reported on 04/08/2023)   [DISCONTINUED] desonide (DESOWEN) 0.05 % ointment Apply to rash under eye QD-BID PRN (Patient not taking: Reported on 04/08/2023)   No facility-administered encounter medications on file as of 04/09/2023.    Review of Systems  Immunization History  Administered Date(s) Administered   Fluad Quad(high Dose 65+) 02/28/2019, 03/04/2021, 03/05/2022   Influenza, High Dose Seasonal PF 03/12/2016, 02/22/2017, 02/25/2018   Influenza,inj,Quad PF,6+ Mos 03/28/2015   Influenza-Unspecified 02/13/2020   Moderna Sars-Covid-2 Vaccination 09/20/2020   PFIZER(Purple Top)SARS-COV-2 Vaccination 06/21/2019, 07/12/2019, 03/12/2020   Pfizer Covid-19 Vaccine Bivalent Booster 1yrs & up 02/28/2021, 03/29/2022   Pneumococcal Conjugate-13 08/07/2013, 09/04/2013   Pneumococcal Polysaccharide-23 04/04/2012   Rotavirus,unspecified  05/22/2022   Tdap 04/04/2012, 03/29/2022   Zoster Recombinant(Shingrix) 12/14/2017, 02/21/2018   Zoster, Live 09/17/2010   Pertinent  Health Maintenance Due  Topic Date Due   HEMOGLOBIN A1C  04/09/2023   FOOT EXAM  04/10/2023   OPHTHALMOLOGY EXAM  10/29/2023   Colonoscopy  02/01/2024   INFLUENZA VACCINE  Completed      07/24/2022    8:11 AM 10/05/2022    3:30 PM 12/07/2022    1:24 PM 01/06/2023    2:23 PM 01/25/2023   11:43 AM  Fall Risk  Falls in the past  year? 0 0 0 0 0  Was there an injury with Fall? 0 0 0  0  Fall Risk Category Calculator 0 0 0  0  Patient at Risk for Falls Due to No Fall Risks Impaired balance/gait Impaired balance/gait Impaired balance/gait No Fall Risks  Fall risk Follow up Falls prevention discussed;Education provided;Falls evaluation completed  Falls prevention discussed Falls prevention discussed;Education provided Falls prevention discussed;Education provided;Falls evaluation completed   Functional Status Survey:    Vitals:   04/09/23 1115  BP: 120/81  Pulse: 75  Resp: 18  Temp: 97.7 F (36.5  C)  SpO2: 96%  Weight: 186 lb 6.4 oz (84.6 kg)  Height: 6\' 3"  (1.905 m)   Body mass index is 23.3 kg/m. Physical Exam Cardiovascular:     Rate and Rhythm: Normal rate. Rhythm irregular.     Pulses: Normal pulses.  Pulmonary:     Effort: Pulmonary effort is normal.  Musculoskeletal:        General: No swelling or deformity.  Neurological:     General: No focal deficit present.     Mental Status: He is alert.     Labs reviewed: Recent Labs    06/03/22 1404 07/24/22 1018 04/08/23 1437  NA 142 134* 135  K 4.9 3.8 4.2  CL 105 99 99  CO2 31 23 24   GLUCOSE 149* 95 120*  BUN 19 21 17   CREATININE 0.68* 0.84 0.66  CALCIUM 9.1 8.5* 9.0   Recent Labs    06/03/22 1404 07/24/22 1018 04/08/23 1437  AST 24 45* 16  ALT 25 42 11  ALKPHOS  --  62 62  BILITOT 1.0 1.7* 1.0  PROT 6.4 7.0 7.8  ALBUMIN  --  4.0 3.9   Recent Labs    01/25/23 1322 02/22/23 1535 03/11/23 0000 04/08/23 1437  WBC 4.1 3.7* 3.9 4.9  NEUTROABS 2,390 1,994 1,997.00 2.6  HGB 13.0* 13.5 11.6* 13.2  HCT 38.2* 39.9 35* 40.1  MCV 101.6* 100.0  --  99.8  PLT 101* 119* 141* 133*   Lab Results  Component Value Date   TSH 1.42 12/05/2015   Lab Results  Component Value Date   HGBA1C 9.0 10/08/2022   Lab Results  Component Value Date   CHOL 68 06/03/2022   HDL 41 06/03/2022   LDLCALC 11 06/03/2022   TRIG 75 06/03/2022    CHOLHDL 1.7 06/03/2022    Significant Diagnostic Results in last 30 days:  US ABDOMEN LIMITED RUQ (LIVER/GB)  Result Date: 04/08/2023 CLINICAL DATA:  Right upper quadrant abdominal pain EXAM: ULTRASOUND ABDOMEN LIMITED RIGHT UPPER QUADRANT COMPARISON:  CT 07/24/2022 FINDINGS: Gallbladder: No gallstones or wall thickening visualized. No sonographic Murphy sign noted by sonographer. Common bile duct: Diameter: 4 mm in proximal diameter Liver: No focal lesion identified. Within normal limits in parenchymal echogenicity. Portal vein is patent on color Doppler imaging with normal direction of blood flow towards the liver. Other: None. IMPRESSION: 1. Normal right upper quadrant sonogram. Electronically Signed   By: Helyn Numbers M.D.   On: 04/08/2023 22:17    Assessment/Plan Right upper quadrant abdominal pain  Type 2 diabetes mellitus with microalbuminuria, with long-term current use of insulin (HCC)  Hypertension, benign  Persistent atrial fibrillation (HCC)  History of total knee replacement, unspecified laterality  Benign essential HTN  Mild protein-calorie malnutrition (HCC) Patient with right upper quadrant pain and presentation to the ED. Negative work up. Labs unremarkable and returned to the facility. Non-tender on exam today. BP well-controlled. Low Glucose levels in the morning, patient declined discontinuation of med without specialist approval, recommend follow up with PCP in 7-10 days post discharge. S/p right knee replacement and progressing well. F/u with ortho on Monday to determine whether or not he can resume driving. At this time he is safe and ready for discharge.   Family/ staff Communication: Nursing, attempted contact with endocrinology without success.   Labs/tests ordered:  PCP f/u with glucose control

## 2023-04-12 DIAGNOSIS — Z96651 Presence of right artificial knee joint: Secondary | ICD-10-CM | POA: Diagnosis not present

## 2023-04-14 ENCOUNTER — Inpatient Hospital Stay: Payer: Medicare Other | Admitting: Physician Assistant

## 2023-04-15 DIAGNOSIS — R278 Other lack of coordination: Secondary | ICD-10-CM | POA: Diagnosis not present

## 2023-04-15 DIAGNOSIS — R2689 Other abnormalities of gait and mobility: Secondary | ICD-10-CM | POA: Diagnosis not present

## 2023-04-15 DIAGNOSIS — Z96651 Presence of right artificial knee joint: Secondary | ICD-10-CM | POA: Diagnosis not present

## 2023-04-15 DIAGNOSIS — M6281 Muscle weakness (generalized): Secondary | ICD-10-CM | POA: Diagnosis not present

## 2023-04-15 DIAGNOSIS — Z471 Aftercare following joint replacement surgery: Secondary | ICD-10-CM | POA: Diagnosis not present

## 2023-04-15 DIAGNOSIS — R2681 Unsteadiness on feet: Secondary | ICD-10-CM | POA: Diagnosis not present

## 2023-04-16 DIAGNOSIS — M6281 Muscle weakness (generalized): Secondary | ICD-10-CM | POA: Diagnosis not present

## 2023-04-16 DIAGNOSIS — Z96651 Presence of right artificial knee joint: Secondary | ICD-10-CM | POA: Diagnosis not present

## 2023-04-16 DIAGNOSIS — R278 Other lack of coordination: Secondary | ICD-10-CM | POA: Diagnosis not present

## 2023-04-16 DIAGNOSIS — R2689 Other abnormalities of gait and mobility: Secondary | ICD-10-CM | POA: Diagnosis not present

## 2023-04-16 DIAGNOSIS — R2681 Unsteadiness on feet: Secondary | ICD-10-CM | POA: Diagnosis not present

## 2023-04-16 DIAGNOSIS — Z471 Aftercare following joint replacement surgery: Secondary | ICD-10-CM | POA: Diagnosis not present

## 2023-04-19 ENCOUNTER — Telehealth: Payer: Self-pay | Admitting: Family Medicine

## 2023-04-19 DIAGNOSIS — R2681 Unsteadiness on feet: Secondary | ICD-10-CM | POA: Diagnosis not present

## 2023-04-19 DIAGNOSIS — Z471 Aftercare following joint replacement surgery: Secondary | ICD-10-CM | POA: Diagnosis not present

## 2023-04-19 DIAGNOSIS — R2689 Other abnormalities of gait and mobility: Secondary | ICD-10-CM | POA: Diagnosis not present

## 2023-04-19 DIAGNOSIS — M6281 Muscle weakness (generalized): Secondary | ICD-10-CM | POA: Diagnosis not present

## 2023-04-19 DIAGNOSIS — Z96651 Presence of right artificial knee joint: Secondary | ICD-10-CM | POA: Diagnosis not present

## 2023-04-19 DIAGNOSIS — R278 Other lack of coordination: Secondary | ICD-10-CM | POA: Diagnosis not present

## 2023-04-19 NOTE — Telephone Encounter (Unsigned)
Copied from CRM [id number]. Topic: General - Other >> Apr 19, 2023  9:55 AM Lennox Pippins wrote: Patient called and states he did not want to see Denny Peon Mecum for his HFU on 11.5.2024, he only wanted to see Dr Carlynn Purl. Patient states he does not need his HFU and insisted I cancel this visit, and schedule him for a regular follow in January with Dr Carlynn Purl. I explained her schedule was booked a little out which is why he was scheduled with Denny Peon Mecum and his provider likes to follow up after hospital visits. Patient still insisted I cancel with Erin Mecum on 11.5.2024 so I did. I scheduled patient a regular follow up with Dr Carlynn Purl on 1.6.2024 as patient requested.

## 2023-04-19 NOTE — Telephone Encounter (Signed)
Pt is scheduled on 05/10/23

## 2023-04-20 ENCOUNTER — Inpatient Hospital Stay: Payer: Medicare Other | Admitting: Physician Assistant

## 2023-04-20 MED ORDER — SODIUM CHLORIDE 0.9 % IV SOLN: INTRAVENOUS | Status: DC

## 2023-04-21 ENCOUNTER — Encounter
Admission: RE | Disposition: A | Discharge status: Home / Self Care | Payer: Self-pay | Source: Home / Self Care | Attending: Cardiology

## 2023-04-21 ENCOUNTER — Ambulatory Visit: Payer: Medicare Other | Admitting: Anesthesiology

## 2023-04-21 ENCOUNTER — Encounter: Payer: Self-pay | Admitting: Cardiology

## 2023-04-21 ENCOUNTER — Ambulatory Visit
Admission: RE | Admit: 2023-04-21 | Discharge: 2023-04-21 | Disposition: A | Discharge status: Home / Self Care | Payer: Medicare Other | Attending: Cardiology | Admitting: Cardiology

## 2023-04-21 DIAGNOSIS — I10 Essential (primary) hypertension: Secondary | ICD-10-CM | POA: Diagnosis not present

## 2023-04-21 DIAGNOSIS — Z7985 Long-term (current) use of injectable non-insulin antidiabetic drugs: Secondary | ICD-10-CM | POA: Insufficient documentation

## 2023-04-21 DIAGNOSIS — N289 Disorder of kidney and ureter, unspecified: Secondary | ICD-10-CM | POA: Diagnosis not present

## 2023-04-21 DIAGNOSIS — I480 Paroxysmal atrial fibrillation: Secondary | ICD-10-CM | POA: Diagnosis not present

## 2023-04-21 DIAGNOSIS — Z7984 Long term (current) use of oral hypoglycemic drugs: Secondary | ICD-10-CM | POA: Insufficient documentation

## 2023-04-21 DIAGNOSIS — I4891 Unspecified atrial fibrillation: Secondary | ICD-10-CM | POA: Insufficient documentation

## 2023-04-21 DIAGNOSIS — E119 Type 2 diabetes mellitus without complications: Secondary | ICD-10-CM | POA: Insufficient documentation

## 2023-04-21 DIAGNOSIS — I4819 Other persistent atrial fibrillation: Secondary | ICD-10-CM

## 2023-04-21 DIAGNOSIS — G709 Myoneural disorder, unspecified: Secondary | ICD-10-CM | POA: Diagnosis not present

## 2023-04-21 HISTORY — PX: CARDIOVERSION: SHX1299

## 2023-04-21 SURGERY — CARDIOVERSION
Anesthesia: General

## 2023-04-21 MED ORDER — PROPOFOL 1000 MG/100ML IV EMUL
INTRAVENOUS | Status: AC
  Filled 2023-04-21: qty 100

## 2023-04-21 MED ORDER — PROPOFOL 10 MG/ML IV BOLUS
INTRAVENOUS | Status: DC | PRN
  Administered 2023-04-21: 60 mg via INTRAVENOUS
  Administered 2023-04-21: 10 mg via INTRAVENOUS

## 2023-04-21 MED ORDER — SODIUM CHLORIDE 0.9% FLUSH
INTRAVENOUS | Status: DC | PRN
  Administered 2023-04-21 (×2): 10 mL via INTRAVENOUS

## 2023-04-21 NOTE — Anesthesia Preprocedure Evaluation (Signed)
Anesthesia Evaluation  Patient identified by MRN, date of birth, ID band Patient awake    Reviewed: Allergy & Precautions, NPO status , Patient's Chart, lab work & pertinent test results  History of Anesthesia Complications Negative for: history of anesthetic complications  Airway Mallampati: III  TM Distance: >3 FB Neck ROM: full    Dental  (+) Chipped   Pulmonary neg pulmonary ROS   Pulmonary exam normal        Cardiovascular hypertension, + dysrhythmias Atrial Fibrillation  Rhythm:Irregular Rate:Normal     Neuro/Psych Seizures -,   Neuromuscular disease  negative psych ROS   GI/Hepatic Neg liver ROS, hiatal hernia,GERD  ,,  Endo/Other  diabetes, Type 2, Insulin Dependent, Oral Hypoglycemic Agents    Renal/GU Renal disease  negative genitourinary   Musculoskeletal   Abdominal   Peds  Hematology  (+) Blood dyscrasia, anemia   Anesthesia Other Findings Past Medical History: No date: Allergy 10/09/2021: Basal cell carcinoma     Comment:  right medial forehead at glabella, Mohs 01/22/22 No date: Chronic kidney disease No date: Diabetes mellitus without complication (HCC) No date: Diabetic neuropathy (HCC) 1980's: Diverticula, colon No date: Diverticulosis No date: Enlarged prostate No date: GERD (gastroesophageal reflux disease) No date: Glaucoma No date: Hemorrhoids No date: High cholesterol No date: History of hiatal hernia No date: Hyperlipidemia No date: Hypertension No date: Obesity No date: Onychomycosis No date: Seizures (HCC) 1990's: Small bowel obstruction (HCC) No date: Urination disorder  Past Surgical History: No date: APPENDECTOMY 2013: BLADDER DIVERTICULECTOMY 02/24/2023: CARDIOVERSION; N/A     Comment:  Procedure: CARDIOVERSION;  Surgeon: Marcina Millard, MD;  Location: [facility] ORS;  Service:               Cardiovascular;  Laterality: N/A; 01/2022: CARPAL TUNNEL  RELEASE     Comment:  revision right side 1980's: COLON SURGERY; Left 1990's: COLONOSCOPY W/ POLYPECTOMY     Comment:  Dr Okey Dupre 12/21/2014: COLONOSCOPY WITH PROPOFOL; N/A     Comment:  Procedure: COLONOSCOPY WITH PROPOFOL;  Surgeon: Christena Deem, MD;  Location: [facility] ENDOSCOPY;  Service:               Endoscopy;  Laterality: N/A; 08/16/2017: COLONOSCOPY WITH PROPOFOL; N/A     Comment:  Procedure: COLONOSCOPY WITH PROPOFOL;  Surgeon:               Christena Deem, MD;  Location: [facility] ENDOSCOPY;                Service: Endoscopy;  Laterality: N/A; 01/31/2021: COLONOSCOPY WITH PROPOFOL; N/A     Comment:  Procedure: COLONOSCOPY WITH PROPOFOL;  Surgeon: Earline Mayotte, MD;  Location: [facility] ENDOSCOPY;  Service:               Endoscopy;  Laterality: N/A; 02/15/2015: ESOPHAGOGASTRODUODENOSCOPY (EGD) WITH PROPOFOL; N/A     Comment:  Procedure: ESOPHAGOGASTRODUODENOSCOPY (EGD) WITH               PROPOFOL;  Surgeon: Christena Deem, MD;  Location:               [facility] ENDOSCOPY;  Service: Endoscopy;  Laterality: N/A; 06/10/2017: ESOPHAGOGASTRODUODENOSCOPY (EGD) WITH PROPOFOL; N/A     Comment:  Procedure: ESOPHAGOGASTRODUODENOSCOPY (EGD) WITH  PROPOFOL;  Surgeon: Christena Deem, MD;  Location:               [facility] ENDOSCOPY;  Service: Endoscopy;  Laterality: N/A; 08/16/2017: ESOPHAGOGASTRODUODENOSCOPY (EGD) WITH PROPOFOL; N/A     Comment:  Procedure: ESOPHAGOGASTRODUODENOSCOPY (EGD) WITH               PROPOFOL;  Surgeon: Christena Deem, MD;  Location:               [facility] ENDOSCOPY;  Service: Endoscopy;  Laterality: N/A; 01/31/2021: ESOPHAGOGASTRODUODENOSCOPY (EGD) WITH PROPOFOL; N/A     Comment:  Procedure: ESOPHAGOGASTRODUODENOSCOPY (EGD) WITH               PROPOFOL;  Surgeon: Earline Mayotte, MD;  Location:               [facility] ENDOSCOPY;  Service: Endoscopy;  Laterality: N/A; No date: EYE SURGERY 1990's: GLAUCOMA SURGERY; Left      Comment:  [facility] 1980's: HEMORROIDECTOMY     Comment:  [facility] No date: HERNIA REPAIR     Comment:  [facility], Dr Okey Dupre 1960's: INGUINAL HERNIA REPAIR; Bilateral 1990: LARYNX SURGERY     Comment:  3 times 01/22/2022: MOHS SURGERY     Comment:  Dr. Verdell Face [facility] No date: REPLACEMENT TOTAL KNEE; Right No date: RETINAL DETACHMENT SURGERY; Right No date: TONSILLECTOMY 2013: turp 2013: URETER SURGERY     Comment:  Dr Achilles Dunk  BMI    Body Mass Index: 23.00 kg/m      Reproductive/Obstetrics negative OB ROS                              Anesthesia Physical Anesthesia Plan  ASA: 4  Anesthesia Plan: General   Post-op Pain Management:    Induction: Intravenous  PONV Risk Score and Plan: 1 and Propofol infusion and TIVA  Airway Management Planned: Natural Airway and Nasal Cannula  Additional Equipment:   Intra-op Plan:   Post-operative Plan:   Informed Consent: I have reviewed the patients History and Physical, chart, labs and discussed the procedure including the risks, benefits and alternatives for the proposed anesthesia with the patient or authorized representative who has indicated his/her understanding and acceptance.     Dental Advisory Given  Plan Discussed with: Anesthesiologist, CRNA and Surgeon  Anesthesia Plan Comments: (Patient consented for risks of anesthesia including but not limited to:  - adverse reactions to medications - risk of airway placement if required - damage to eyes, teeth, lips or other oral mucosa - nerve damage due to positioning  - sore throat or hoarseness - Damage to heart, brain, nerves, lungs, other parts of body or loss of life  Patient voiced understanding and assent.)         Anesthesia Quick Evaluation

## 2023-04-21 NOTE — Anesthesia Postprocedure Evaluation (Signed)
Anesthesia Post Note  Patient: Jeremy [name]  Procedure(s) Performed: CARDIOVERSION  Patient location during evaluation: Specials Recovery Anesthesia Type: General Level of consciousness: awake and alert Pain management: pain level controlled Vital Signs Assessment: post-procedure vital signs reviewed and stable Respiratory status: spontaneous breathing, nonlabored ventilation, respiratory function stable and patient connected to nasal cannula oxygen Cardiovascular status: blood pressure returned to baseline and stable Postop Assessment: no apparent nausea or vomiting Anesthetic complications: no   No notable events documented.   Last Vitals:  Vitals:   04/21/23 0750 04/21/23 0800  BP: 102/65 100/64  Pulse: 73 74  Resp: 16 18  Temp:    SpO2: 98% 95%    Last Pain:  Vitals:   04/21/23 0800  TempSrc:   PainSc: 0-No pain                 Louie Boston

## 2023-04-21 NOTE — Anesthesia Procedure Notes (Signed)
Procedure Name: General with mask airway Date/Time: 04/21/2023 7:31 AM  Performed by: Mohammed Kindle, CRNAPre-anesthesia Checklist: Patient identified, Emergency Drugs available, Suction available, Patient being monitored and Timeout performed Patient Re-evaluated:Patient Re-evaluated prior to induction Oxygen Delivery Method: Simple face mask Induction Type: IV induction Placement Confirmation: positive ETCO2, CO2 detector and breath sounds checked- equal and bilateral Dental Injury: Teeth and Oropharynx as per pre-operative assessment

## 2023-04-21 NOTE — Transfer of Care (Signed)
Immediate Anesthesia Transfer of Care Note  Patient: Jeremy [name]  Procedure(s) Performed: CARDIOVERSION  Patient Location: Endoscopy Unit  Anesthesia Type:General  Level of Consciousness: drowsy and patient cooperative  Airway & Oxygen Therapy: Patient Spontanous Breathing and Patient connected to face mask oxygen  Post-op Assessment: Report given to RN and Post -op Vital signs reviewed and stable  Post vital signs: Reviewed and stable  Last Vitals:  Vitals Value Taken Time  BP    Temp    Pulse    Resp    SpO2      Last Pain:  Vitals:   04/21/23 0815  TempSrc:   PainSc: 0-No pain         Complications: No notable events documented.

## 2023-04-21 NOTE — Op Note (Signed)
[facility] Cardiology   04/21/2023                     7:34 AM  PATIENT:  Jeremy [name]    PRE-OPERATIVE DIAGNOSIS:  Cardioversion   Afib  POST-OPERATIVE DIAGNOSIS:  Same  PROCEDURE:  CARDIOVERSION  SURGEON:  Marcina Millard, MD    ANESTHESIA:     PREOPERATIVE INDICATIONS:  [name] is a  87 y.o. male with a diagnosis of Cardioversion   Afib who failed conservative measures and elected for surgical management.    The risks benefits and alternatives were discussed with the patient preoperatively including but not limited to the risks of infection, bleeding, cardiopulmonary complications, the need for revision surgery, among others, and the patient was willing to proceed.   OPERATIVE PROCEDURE: Patient presents to special proceedings holding area in a fasting state.  ECG revealed atrial fibrillation at a rate of 77 bpm.  The patient received 70 mg of metoprolol.  Synchronized direct-current cardioversion was performed with 75 J with successful conversion to normal sinus rhythm at 72 bpm.  There were no periprocedural complications.

## 2023-04-22 DIAGNOSIS — Z96651 Presence of right artificial knee joint: Secondary | ICD-10-CM | POA: Diagnosis not present

## 2023-04-22 DIAGNOSIS — R278 Other lack of coordination: Secondary | ICD-10-CM | POA: Diagnosis not present

## 2023-04-22 DIAGNOSIS — Z471 Aftercare following joint replacement surgery: Secondary | ICD-10-CM | POA: Diagnosis not present

## 2023-04-22 DIAGNOSIS — R2689 Other abnormalities of gait and mobility: Secondary | ICD-10-CM | POA: Diagnosis not present

## 2023-04-22 DIAGNOSIS — R2681 Unsteadiness on feet: Secondary | ICD-10-CM | POA: Diagnosis not present

## 2023-04-22 DIAGNOSIS — M6281 Muscle weakness (generalized): Secondary | ICD-10-CM | POA: Diagnosis not present

## 2023-04-23 DIAGNOSIS — R278 Other lack of coordination: Secondary | ICD-10-CM | POA: Diagnosis not present

## 2023-04-23 DIAGNOSIS — R2689 Other abnormalities of gait and mobility: Secondary | ICD-10-CM | POA: Diagnosis not present

## 2023-04-23 DIAGNOSIS — M6281 Muscle weakness (generalized): Secondary | ICD-10-CM | POA: Diagnosis not present

## 2023-04-23 DIAGNOSIS — Z471 Aftercare following joint replacement surgery: Secondary | ICD-10-CM | POA: Diagnosis not present

## 2023-04-23 DIAGNOSIS — R2681 Unsteadiness on feet: Secondary | ICD-10-CM | POA: Diagnosis not present

## 2023-04-23 DIAGNOSIS — Z96651 Presence of right artificial knee joint: Secondary | ICD-10-CM | POA: Diagnosis not present

## 2023-04-26 DIAGNOSIS — R278 Other lack of coordination: Secondary | ICD-10-CM | POA: Diagnosis not present

## 2023-04-26 DIAGNOSIS — Z471 Aftercare following joint replacement surgery: Secondary | ICD-10-CM | POA: Diagnosis not present

## 2023-04-26 DIAGNOSIS — R2681 Unsteadiness on feet: Secondary | ICD-10-CM | POA: Diagnosis not present

## 2023-04-26 DIAGNOSIS — R2689 Other abnormalities of gait and mobility: Secondary | ICD-10-CM | POA: Diagnosis not present

## 2023-04-26 DIAGNOSIS — Z96651 Presence of right artificial knee joint: Secondary | ICD-10-CM | POA: Diagnosis not present

## 2023-04-26 DIAGNOSIS — M6281 Muscle weakness (generalized): Secondary | ICD-10-CM | POA: Diagnosis not present

## 2023-04-28 DIAGNOSIS — E11319 Type 2 diabetes mellitus with unspecified diabetic retinopathy without macular edema: Secondary | ICD-10-CM | POA: Diagnosis not present

## 2023-04-28 DIAGNOSIS — M3501 Sicca syndrome with keratoconjunctivitis: Secondary | ICD-10-CM | POA: Diagnosis not present

## 2023-04-28 DIAGNOSIS — H401122 Primary open-angle glaucoma, left eye, moderate stage: Secondary | ICD-10-CM | POA: Diagnosis not present

## 2023-04-28 DIAGNOSIS — H2702 Aphakia, left eye: Secondary | ICD-10-CM | POA: Diagnosis not present

## 2023-04-30 DIAGNOSIS — R2681 Unsteadiness on feet: Secondary | ICD-10-CM | POA: Diagnosis not present

## 2023-04-30 DIAGNOSIS — R2689 Other abnormalities of gait and mobility: Secondary | ICD-10-CM | POA: Diagnosis not present

## 2023-04-30 DIAGNOSIS — Z471 Aftercare following joint replacement surgery: Secondary | ICD-10-CM | POA: Diagnosis not present

## 2023-04-30 DIAGNOSIS — Z96651 Presence of right artificial knee joint: Secondary | ICD-10-CM | POA: Diagnosis not present

## 2023-04-30 DIAGNOSIS — M6281 Muscle weakness (generalized): Secondary | ICD-10-CM | POA: Diagnosis not present

## 2023-04-30 DIAGNOSIS — R278 Other lack of coordination: Secondary | ICD-10-CM | POA: Diagnosis not present

## 2023-05-03 DIAGNOSIS — I480 Paroxysmal atrial fibrillation: Secondary | ICD-10-CM | POA: Diagnosis not present

## 2023-05-03 DIAGNOSIS — I152 Hypertension secondary to endocrine disorders: Secondary | ICD-10-CM | POA: Diagnosis not present

## 2023-05-03 DIAGNOSIS — E1159 Type 2 diabetes mellitus with other circulatory complications: Secondary | ICD-10-CM | POA: Diagnosis not present

## 2023-05-05 ENCOUNTER — Other Ambulatory Visit: Payer: Self-pay | Admitting: Family Medicine

## 2023-05-05 DIAGNOSIS — Z471 Aftercare following joint replacement surgery: Secondary | ICD-10-CM | POA: Diagnosis not present

## 2023-05-05 DIAGNOSIS — M6281 Muscle weakness (generalized): Secondary | ICD-10-CM | POA: Diagnosis not present

## 2023-05-05 DIAGNOSIS — R2681 Unsteadiness on feet: Secondary | ICD-10-CM | POA: Diagnosis not present

## 2023-05-05 DIAGNOSIS — R2689 Other abnormalities of gait and mobility: Secondary | ICD-10-CM | POA: Diagnosis not present

## 2023-05-05 DIAGNOSIS — Z96651 Presence of right artificial knee joint: Secondary | ICD-10-CM | POA: Diagnosis not present

## 2023-05-05 DIAGNOSIS — R278 Other lack of coordination: Secondary | ICD-10-CM | POA: Diagnosis not present

## 2023-05-05 DIAGNOSIS — N529 Male erectile dysfunction, unspecified: Secondary | ICD-10-CM

## 2023-05-06 ENCOUNTER — Ambulatory Visit: Payer: Medicare Other

## 2023-05-06 VITALS — BP 134/78 | Ht 75.5 in | Wt 192.8 lb

## 2023-05-06 DIAGNOSIS — Z0000 Encounter for general adult medical examination without abnormal findings: Secondary | ICD-10-CM | POA: Diagnosis not present

## 2023-05-06 NOTE — Patient Instructions (Addendum)
Jeremy [name] , Thank you for taking time to come for your Medicare Wellness Visit. I appreciate your ongoing commitment to your health goals. Please review the following plan we discussed and let me know if I can assist you in the future.   Referrals/Orders/Follow-Ups/Clinician Recommendations: none  This is a list of the screening recommended for you and due dates:  Health Maintenance  Topic Date Due   COVID-19 Vaccine (7 - 2023-24 season) 02/14/2023   Hemoglobin A1C  04/09/2023   Complete foot exam   04/10/2023   Yearly kidney health urinalysis for diabetes  06/04/2023   Eye exam for diabetics  10/29/2023   Colon Cancer Screening  02/01/2024   Yearly kidney function blood test for diabetes  04/07/2024   Medicare Annual Wellness Visit  05/05/2024   DTaP/Tdap/Td vaccine (3 - Td or Tdap) 03/29/2032   Pneumonia Vaccine  Completed   Flu Shot  Completed   Zoster (Shingles) Vaccine  Completed   HPV Vaccine  Aged Out    Advanced directives: (In Chart) A copy of your advanced directives are scanned into your chart should your provider ever need it.  Next Medicare Annual Wellness Visit scheduled for next year: Yes   05/25/24 @ 2:30 pm in person

## 2023-05-06 NOTE — Progress Notes (Signed)
Subjective:   LYDELL GRIMSTEAD is a 87 y.o. male who presents for Medicare Annual/Subsequent preventive examination.  Visit Complete: In person  Cardiac Risk Factors include: advanced age (>61men, >72 women);diabetes mellitus;dyslipidemia;male gender;hypertension     Objective:    Today's Vitals   05/06/23 1407 05/06/23 1425  BP: 134/78   Weight: 192 lb 12.8 oz (87.5 kg)   Height: 6' 3.5" (1.918 m)   PainSc:  0-No pain   Body mass index is 23.78 kg/m.     05/06/2023    2:36 PM 04/09/2023   11:24 AM 04/08/2023    2:33 PM 04/08/2023   10:15 AM 04/01/2023   10:27 AM 03/10/2023    9:07 AM 03/09/2023   12:35 PM  Advanced Directives  Does Patient Have a Medical Advance Directive? Yes Yes No Yes Yes Yes Yes  Type of Estate agent of [place];Living will Healthcare Power of [place];Living will  Healthcare Power of [place];Living will Healthcare Power of [place];Living will Healthcare Power of [place];Living will Healthcare Power of [place];Out of facility DNR (pink MOST or yellow form)  Does patient want to make changes to medical advance directive? No - Patient declined No - Patient declined  No - Patient declined No - Patient declined No - Patient declined No - Patient declined  Copy of Healthcare Power of Attorney in Chart? Yes - validated most recent copy scanned in chart (See row information) Yes - validated most recent copy scanned in chart (See row information)  Yes - validated most recent copy scanned in chart (See row information) Yes - validated most recent copy scanned in chart (See row information) Yes - validated most recent copy scanned in chart (See row information) Yes - validated most recent copy scanned in chart (See row information)    Current Medications (verified) Outpatient Encounter Medications as of 05/06/2023  Medication Sig   acetaminophen (TYLENOL) 500 MG tablet Take 1,000 mg by mouth 2 (two) times daily as needed for moderate pain  (pain score 4-6).   apixaban (ELIQUIS) 5 MG TABS tablet Take 5 mg by mouth 2 (two) times daily.   brimonidine-timolol (COMBIGAN) 0.2-0.5 % ophthalmic solution Place 1 drop into the left eye 2 (two) times daily.   desonide (DESOWEN) 0.05 % ointment Apply 1 Application topically daily as needed (irritation).   diltiazem (CARDIZEM CD) 120 MG 24 hr capsule Take 120 mg by mouth daily.   dorzolamide (TRUSOPT) 2 % ophthalmic solution Place 1 drop into the left eye 2 (two) times daily.   empagliflozin (JARDIANCE) 25 MG TABS tablet Take 25 mg by mouth daily.   Exenatide ER (BYDUREON BCISE) 2 MG/0.85ML AUIJ Inject 2 mg into the skin every Sunday.   finasteride (PROSCAR) 5 MG tablet Take 1 tablet by mouth once daily (Patient taking differently: Take 5 mg by mouth every Monday, Wednesday, and Friday.)   flecainide (TAMBOCOR) 50 MG tablet Take 100 mg by mouth 2 (two) times daily.   glipiZIDE (GLUCOTROL) 10 MG tablet Take 10 mg by mouth daily.   hydrocortisone 2.5 % cream APPLY TO THE AFFECTED AREA OF FACE AND EARS EVERY OTHER NIGHT ALTERNATING WITH KETOCONAZOLE CREAM (Patient taking differently: Apply 1 Application topically daily as needed (itching around the face).)   insulin degludec (TRESIBA FLEXTOUCH) 200 UNIT/ML FlexTouch Pen Inject 16 Units into the skin in the morning.   ketoconazole (NIZORAL) 2 % cream Apply 1 Application topically daily.   ketoconazole (NIZORAL) 2 % shampoo Apply 1 Application topically once a week.  loperamide (IMODIUM A-D) 2 MG tablet Take 2 mg by mouth as needed for diarrhea or loose stools.   loratadine (CLARITIN) 10 MG tablet Take 10 mg by mouth daily.   metFORMIN (GLUCOPHAGE) 1000 MG tablet Take 1,000 mg by mouth 2 (two) times daily with a meal. On Monday and Wednesday   MOMETASONE FUROATE EX Apply 1 Application topically daily as needed (itchy scalp).   Multiple Vitamin (MULTIVITAMIN) tablet Take 1 tablet by mouth daily.   omega-3 acid ethyl esters (LOVAZA) 1 g capsule Take  2 capsules (2 g total) by mouth 2 (two) times daily.   omeprazole (PRILOSEC) 20 MG capsule Take 20 mg by mouth daily.   rosuvastatin (CRESTOR) 10 MG tablet Take 1 tablet (10 mg total) by mouth at bedtime.   tadalafil (CIALIS) 20 MG tablet TAKE 1/2 TO 1 (ONE-HALF TO ONE) TABLET BY MOUTH EVERY OTHER DAY AS NEEDED FOR ERECTILE DYSFUNCTION   telmisartan-hydrochlorothiazide (MICARDIS HCT) 80-12.5 MG tablet Take 1 tablet by mouth daily.   No facility-administered encounter medications on file as of 05/06/2023.    Allergies (verified) Demerol [meperidine], Metoprolol, Dilaudid [hydromorphone hcl], Actos [pioglitazone], Dapagliflozin, and Penicillins   History: Past Medical History:  Diagnosis Date   Allergy    Basal cell carcinoma 10/09/2021   right medial forehead at glabella, Mohs 01/22/22   Chronic kidney disease    Diabetes mellitus without complication (HCC)    Diabetic neuropathy (HCC)    Diverticula, colon 1980's   Diverticulosis    Enlarged prostate    GERD (gastroesophageal reflux disease)    Glaucoma    Hemorrhoids    High cholesterol    History of hiatal hernia    Hyperlipidemia    Hypertension    Obesity    Onychomycosis    Seizures (HCC)    Small bowel obstruction (HCC) 1990's   Urination disorder    Past Surgical History:  Procedure Laterality Date   APPENDECTOMY     BLADDER DIVERTICULECTOMY  2013   CARDIOVERSION N/A 02/24/2023   Procedure: CARDIOVERSION;  Surgeon: Marcina Millard, MD;  Location: [facility] ORS;  Service: Cardiovascular;  Laterality: N/A;   CARDIOVERSION N/A 04/21/2023   Procedure: CARDIOVERSION;  Surgeon: Marcina Millard, MD;  Location: [facility] ORS;  Service: Cardiovascular;  Laterality: N/A;   CARPAL TUNNEL RELEASE  01/2022   revision right side   COLON SURGERY Left 1980's   COLONOSCOPY W/ POLYPECTOMY  1990's   Dr Okey Dupre   COLONOSCOPY WITH PROPOFOL N/A 12/21/2014   Procedure: COLONOSCOPY WITH PROPOFOL;  Surgeon: Christena Deem, MD;   Location: [facility] ENDOSCOPY;  Service: Endoscopy;  Laterality: N/A;   COLONOSCOPY WITH PROPOFOL N/A 08/16/2017   Procedure: COLONOSCOPY WITH PROPOFOL;  Surgeon: Christena Deem, MD;  Location: [facility] ENDOSCOPY;  Service: Endoscopy;  Laterality: N/A;   COLONOSCOPY WITH PROPOFOL N/A 01/31/2021   Procedure: COLONOSCOPY WITH PROPOFOL;  Surgeon: Earline Mayotte, MD;  Location: [facility] ENDOSCOPY;  Service: Endoscopy;  Laterality: N/A;   ESOPHAGOGASTRODUODENOSCOPY (EGD) WITH PROPOFOL N/A 02/15/2015   Procedure: ESOPHAGOGASTRODUODENOSCOPY (EGD) WITH PROPOFOL;  Surgeon: Christena Deem, MD;  Location: [facility] ENDOSCOPY;  Service: Endoscopy;  Laterality: N/A;   ESOPHAGOGASTRODUODENOSCOPY (EGD) WITH PROPOFOL N/A 06/10/2017   Procedure: ESOPHAGOGASTRODUODENOSCOPY (EGD) WITH PROPOFOL;  Surgeon: Christena Deem, MD;  Location: [facility] ENDOSCOPY;  Service: Endoscopy;  Laterality: N/A;   ESOPHAGOGASTRODUODENOSCOPY (EGD) WITH PROPOFOL N/A 08/16/2017   Procedure: ESOPHAGOGASTRODUODENOSCOPY (EGD) WITH PROPOFOL;  Surgeon: Christena Deem, MD;  Location: [facility] ENDOSCOPY;  Service: Endoscopy;  Laterality: N/A;  ESOPHAGOGASTRODUODENOSCOPY (EGD) WITH PROPOFOL N/A 01/31/2021   Procedure: ESOPHAGOGASTRODUODENOSCOPY (EGD) WITH PROPOFOL;  Surgeon: Earline Mayotte, MD;  Location: [facility] ENDOSCOPY;  Service: Endoscopy;  Laterality: N/A;   EYE SURGERY     GLAUCOMA SURGERY Left 1990's   [facility]   HEMORROIDECTOMY  1980's   [facility]   HERNIA REPAIR     [facility], Dr Carol Ada HERNIA REPAIR Bilateral [phone]   LARYNX SURGERY  1990   3 times   MOHS SURGERY  01/22/2022   Dr. Verdell Face - [facility]   REPLACEMENT TOTAL KNEE Right    RETINAL DETACHMENT SURGERY Right    TONSILLECTOMY     turp  2013   URETER SURGERY  2013   Dr Achilles Dunk   Family History  Problem Relation Age of Onset   Diabetes Mother    Cataracts Mother    Metabolic syndrome Mother    Blindness Mother        r/t diabetes   Cataracts Father     Glaucoma Father        Retina-Detachment   Atrial fibrillation Father    Diverticulitis Sister    Glaucoma Brother    Diabetes Brother    Cancer Sister        Gallbladder   Cancer Daughter    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)   Tobacco Use   Smoking status: Never   Smokeless tobacco: Never   Tobacco comments:    smoking cessation materials not required  Vaping Use   Vaping status: Never Used  Substance and Sexual Activity   Alcohol use: No   Drug use: No   Sexual activity: Yes  Other Topics Concern   Not on file  Social History Narrative   Married , still works as a Development worker, community Garment/textile technologist)   Three children ( one died in his 38's with colon cancer) , one lives in [place] and the other one in [place]   His siblings are in [place] and [place]   Social Determinants of Health   Financial Resource Strain: Low Risk  (05/06/2023)   Overall Financial Resource Strain (CARDIA)    Difficulty of Paying Living Expenses: Not hard at all  Food Insecurity: No Food Insecurity (05/06/2023)   Hunger Vital Sign    Worried About Running Out of Food in the Last Year: Never true    Ran Out of Food in the Last Year: Never true  Transportation Needs: No Transportation Needs (05/06/2023)   PRAPARE - Administrator, Civil Service (Medical): No    Lack of Transportation (Non-Medical): No  Physical Activity: Sufficiently Active (05/06/2023)   Exercise Vital Sign    Days of Exercise per Week: 3 days    Minutes of Exercise per Session: 60 min  Stress: No Stress Concern Present (05/06/2023)   [employer] of Occupational Health - Occupational Stress Questionnaire    Feeling of Stress : Not at  all  Social Connections: Moderately Integrated (05/06/2023)   Social Connection and Isolation Panel [NHANES]    Frequency of Communication with Friends and Family: More than three times a week    Frequency of Social  Gatherings with Friends and Family: Never    Attends Religious Services: Never    Database administrator or Organizations: Yes    Attends Engineer, structural: 1 to 4 times per year    Marital Status: Married    Tobacco Counseling Counseling given: Not Answered Tobacco comments: smoking cessation materials not required   Clinical Intake:  Pre-visit preparation completed: Yes  Pain : No/denies pain Pain Score: 0-No pain     BMI - recorded: 24.64 Nutritional Status: BMI of 19-24  Normal Nutritional Risks: None Diabetes: Yes CBG done?: No Did pt. bring in CBG monitor from home?: No  How often do you need to have someone help you when you read instructions, pamphlets, or other written materials from your doctor or pharmacy?: 1 - Never  Interpreter Needed?: No  Information entered by :: Kennedy Bucker, LPN   Activities of Daily Living    05/06/2023    2:38 PM 04/21/2023    7:09 AM  In your present state of health, do you have any difficulty performing the following activities:  Hearing? 0 0  Vision? 0 0  Difficulty concentrating or making decisions? 0 0  Walking or climbing stairs? 1   Comment knee   Dressing or bathing? 0   Doing errands, shopping? 0   Preparing Food and eating ? N   Using the Toilet? N   In the past six months, have you accidently leaked urine? N   Do you have problems with loss of bowel control? N   Managing your Medications? N   Managing your Finances? N   Housekeeping or managing your Housekeeping? N     Patient Care Team: Alba Cory, MD as PCP - General (Family Medicine) Requested, Self Galen Manila, MD as Consulting Physician (Ophthalmology) Deeann Saint, MD as Consulting Physician (Orthopedic  Surgery) Marcina Millard, MD as Consulting Physician (Cardiology) Sherlon Handing, MD as Consulting Physician (Internal Medicine) Linus Galas, [place] (Podiatry) Deirdre Evener, MD (Dermatology) Jeralyn Ruths, MD as Consulting Physician (Oncology)  Indicate any recent Medical Services you may have received from other than [facility] providers in the past year (date may be approximate).     Assessment:   This is a routine wellness examination for Jaydis.  Hearing/Vision screen Hearing Screening - Comments:: No aids Vision Screening - Comments:: Wears glasses- Dr.Porfilio Has early diabetic retinopathy   Goals Addressed             This Visit's Progress    DIET - EAT MORE FRUITS AND VEGETABLES         Depression Screen    05/06/2023    2:32 PM 01/25/2023   11:43 AM 01/06/2023    2:23 PM 12/07/2022    1:24 PM 10/05/2022    3:31 PM 07/24/2022    8:11 AM 06/03/2022    1:16 PM  PHQ 2/9 Scores  PHQ - 2 Score 0 0 0 0 0 0 0  PHQ- 9 Score 0 0 0 0 0 0 0    Fall Risk    05/06/2023    2:38 PM 01/25/2023   11:43 AM 01/06/2023    2:23 PM 12/07/2022    1:24 PM 10/05/2022    3:30 PM  Fall Risk   Falls in the past year? 0 0 0 0 0  Number falls in past yr: 0 0  0 0  Injury with Fall? 0 0  0 0  Risk for fall due to : No Fall Risks  No Fall Risks Impaired balance/gait Impaired balance/gait Impaired balance/gait  Follow up Falls prevention discussed;Falls evaluation completed Falls prevention discussed;Education provided;Falls evaluation completed Falls prevention discussed;Education provided Falls prevention discussed     MEDICARE RISK AT HOME: Medicare Risk at Home Any stairs in or around the home?: Yes If so, are there any without handrails?: No Home free of loose throw rugs in walkways, pet beds, electrical cords, etc?: Yes Adequate lighting in your home to reduce risk of falls?: Yes Life alert?: No Use of a cane, walker or w/c?: Yes (uses cane everyday) Grab bars in the  bathroom?: Yes Shower chair or bench in shower?: No Elevated toilet seat or a handicapped toilet?: Yes  TIMED UP AND GO:  Was the test performed?  Yes  Length of time to ambulate 10 feet: 6 sec Gait slow and steady with assistive device    Cognitive Function:        05/06/2023    2:41 PM 03/05/2022    2:07 PM 02/28/2019    2:02 PM 02/25/2018    1:03 PM  6CIT Screen  What Year? 0 points 0 points 0 points 0 points  What month? 0 points 0 points 0 points 0 points  What time? 0 points 0 points 0 points 0 points  Count back from 20 0 points 0 points 0 points 0 points  Months in reverse 0 points 0 points 0 points 0 points  Repeat phrase 0 points 0 points 0 points 4 points  Total Score 0 points 0 points 0 points 4 points    Immunizations Immunization History  Administered Date(s) Administered   Fluad Quad(high Dose 65+) 02/28/2019, 03/04/2021, 03/05/2022   Influenza, High Dose Seasonal PF 03/12/2016, 02/22/2017, 02/25/2018   Influenza,inj,Quad PF,6+ Mos 03/28/2015   Influenza-Unspecified 02/13/2020, 02/28/2023   Moderna Sars-Covid-2 Vaccination 09/20/2020   PFIZER(Purple Top)SARS-COV-2 Vaccination 06/21/2019, 07/12/2019, 03/12/2020   Pfizer Covid-19 Vaccine Bivalent Booster 65yrs & up 02/28/2021, 03/29/2022   Pneumococcal Conjugate-13 08/07/2013, 09/04/2013   Pneumococcal Polysaccharide-23 04/04/2012   Rotavirus,unspecified  05/22/2022   Tdap 04/04/2012, 03/29/2022   Zoster Recombinant(Shingrix) 12/14/2017, 02/21/2018   Zoster, Live 09/17/2010    TDAP status: Up to date  Flu Vaccine status: Up to date  Pneumococcal vaccine status: Declined,  Education has been provided regarding the importance of this vaccine but patient still declined. Advised may receive this vaccine at local pharmacy or Health Dept. Aware to provide a copy of the vaccination record if obtained from local pharmacy or Health Dept. Verbalized acceptance and understanding.   Covid-19 vaccine status:  Completed vaccines  Qualifies for Shingles Vaccine? Yes   Zostavax completed Yes   Shingrix Completed?: Yes  Screening Tests Health Maintenance  Topic Date Due   COVID-19 Vaccine (7 - 2023-24 season) 02/14/2023   HEMOGLOBIN A1C  04/09/2023   FOOT EXAM  04/10/2023   Diabetic kidney evaluation - Urine ACR  06/04/2023   OPHTHALMOLOGY EXAM  10/29/2023   Colonoscopy  02/01/2024   Diabetic kidney evaluation - eGFR measurement  04/07/2024   Medicare Annual Wellness (AWV)  05/05/2024   DTaP/Tdap/Td (3 - Td or Tdap) 03/29/2032   Pneumonia Vaccine 59+ Years old  Completed   INFLUENZA VACCINE  Completed   Zoster Vaccines- Shingrix  Completed   HPV VACCINES  Aged Out    Health Maintenance  Health Maintenance Due  Topic Date Due   COVID-19 Vaccine (7 - 2023-24 season) 02/14/2023   HEMOGLOBIN A1C  04/09/2023   FOOT EXAM  04/10/2023   Diabetic  kidney evaluation - Urine ACR  06/04/2023    Colorectal cancer screening: Type of screening: Colonoscopy. Completed 01/31/21. Repeat every 3 years  Lung Cancer Screening: (Low Dose CT Chest recommended if Age 61-80 years, 20 pack-year currently smoking OR have quit w/in 15years.) does not qualify.    Additional Screening:  Hepatitis C Screening: does not qualify; Completed no  Vision Screening: Recommended annual ophthalmology exams for early detection of glaucoma and other disorders of the eye. Is the patient up to date with their annual eye exam?  Yes  Who is the provider or what is the name of the office in which the patient attends annual eye exams? Dr.Porfilio If pt is not established with a provider, would they like to be referred to a provider to establish care? No .   Dental Screening: Recommended annual dental exams for proper oral hygiene  Diabetic Foot Exam: Diabetic Foot Exam: Completed HAS APPOINTMENT AT END OF DECEMBER 2024  Community Resource Referral / Chronic Care Management: CRR required this visit?  No   CCM required  this visit?  No     Plan:     I have personally reviewed and noted the following in the patient's chart:   Medical and social history Use of alcohol, tobacco or illicit drugs  Current medications and supplements including opioid prescriptions. Patient is not currently taking opioid prescriptions. Functional ability and status Nutritional status Physical activity Advanced directives List of other physicians Hospitalizations, surgeries, and ER visits in previous 12 months Vitals Screenings to include cognitive, depression, and falls Referrals and appointments  In addition, I have reviewed and discussed with patient certain preventive protocols, quality metrics, and best practice recommendations. A written personalized care plan for preventive services as well as general preventive health recommendations were provided to patient.     Hal Hope, LPN   [id number]   After Visit Summary: (In Person-Declined) Patient declined AVS at this time.-   Nurse Notes: NONE

## 2023-05-07 ENCOUNTER — Other Ambulatory Visit: Payer: Self-pay | Admitting: Family Medicine

## 2023-05-07 DIAGNOSIS — Z96651 Presence of right artificial knee joint: Secondary | ICD-10-CM | POA: Diagnosis not present

## 2023-05-07 DIAGNOSIS — R2689 Other abnormalities of gait and mobility: Secondary | ICD-10-CM | POA: Diagnosis not present

## 2023-05-07 DIAGNOSIS — M6281 Muscle weakness (generalized): Secondary | ICD-10-CM | POA: Diagnosis not present

## 2023-05-07 DIAGNOSIS — R278 Other lack of coordination: Secondary | ICD-10-CM | POA: Diagnosis not present

## 2023-05-07 DIAGNOSIS — Z471 Aftercare following joint replacement surgery: Secondary | ICD-10-CM | POA: Diagnosis not present

## 2023-05-07 DIAGNOSIS — R2681 Unsteadiness on feet: Secondary | ICD-10-CM | POA: Diagnosis not present

## 2023-05-07 NOTE — Progress Notes (Unsigned)
Name: Jeremy [name]   MRN: [id number]    DOB: 05-27-1936   Date:05/10/2023       Progress Note  Subjective  Chief Complaint  Follow Up  HPI  Discussed the use of AI scribe software for clinical note transcription with the patient, who gave verbal consent to proceed.  History of Present Illness   The patient, with a history of diabetes, atrial fibrillation, and recent knee surgery, reports a decrease in his A1c to 5.8% since his last visit. He notes no changes in his diabetes medications, but mentions a disruption in his regimen due to recent knee surgery. He was advised to adjust his insulin dosage around the time of the surgery, which he complied with.  Prior to the surgery, the patient experienced episodes of shortness of breath upon waking, necessitating sitting upright to alleviate the symptom. This was accompanied by a heart rate of around 20.He was advised by cardiologist to discontinue the beta blocker, which resolved the shortness of breath.  The patient has a history of atrial fibrillation, which was attempted to be converted with shock treatment prior to the knee surgery. However, the conversion only lasted a day before reverting back to atrial fibrillation. A second attempt at conversion post-surgery yielded the same result. The patient is currently on Cardizem 120 mg and Flecainide 100 mg twice a day for rate control. He is also on Eliquis as a blood thinner.  The patient's knee post-surgery is reportedly pain-free, but he notes some swelling compared to the other knee. He is currently attending physical therapy to regain his walking ability and is using a cane for support.  The patient's diabetes management includes 26 units of insulin, 10 mg of Glipizide, Bydureon , Metformin  and Jardiance. He notes a weight loss during his hospital stay, which he initially attributed to [place], but his weight has since returned to baseline. He denies any excessive hunger, thirst, or  frequent urination.   Atherosclerosis of Aorta: found on CT abdomein, he is keeping bp under control and taking statins  The patient also has a history of proteinuria and diabetic retinopathy. He takes Rosuvastatin for dyslipidemia. He also takes Telmisartan HCTZ for blood pressure control and Omeprazole for reflux and chronic gastritis. He has a history of basal cell carcinoma of the face, which was treated by a dermatologist.         Patient Active Problem List   Diagnosis Date Noted   Insomnia 03/11/2023   Anemia 03/11/2023   Hypertension, benign 03/11/2023   Type 2 diabetes mellitus with microalbuminuria, with long-term current use of insulin (HCC) 03/11/2023   History of total knee replacement 03/11/2023   Persistent atrial fibrillation (HCC) 02/16/2023   Thrombocytopenia (HCC) 06/03/2022   B12 deficiency 06/03/2022   Basal cell carcinoma (BCC) of skin of face 11/03/2021   Dyslipidemia associated with type 2 diabetes mellitus (HCC) 11/03/2021   Impingement syndrome of shoulder region 11/16/2018   Atherosclerosis of aorta (HCC) 07/21/2018   History of diverticulitis of colon 11/15/2017   Hyperlipidemia due to type 2 diabetes mellitus (HCC) 03/14/2017   Bilateral lower extremity edema 03/27/2016   Vitamin D deficiency 12/05/2015   Hx of transient ischemic attack (TIA) 09/17/2015   Diabetes mellitus type 2 without retinopathy (HCC) 05/31/2015   Hyperlipidemia 05/30/2015   Type 2 diabetes mellitus with renal manifestations (HCC) 01/08/2015   Incisional hernia, without obstruction or gangrene 03/23/2013   Enlarged prostate with lower urinary tract symptoms (LUTS) 11/03/2012   Myopic degeneration,  bilateral 08/12/2012   Nonexudative age-related macular degeneration 07/15/2012   Anisometropia 04/08/2012   Pseudophakia 04/08/2012   History of retinal detachment 04/08/2012   Presence of intraocular lens 04/08/2012   Secondary open-angle glaucoma of left eye, severe stage 01/13/2012    Benign essential HTN 02/03/2007   Hypertension associated with diabetes (HCC) 02/03/2007    Past Surgical History:  Procedure Laterality Date   APPENDECTOMY     BLADDER DIVERTICULECTOMY  2013   CARDIOVERSION N/A 02/24/2023   Procedure: CARDIOVERSION;  Surgeon: Marcina Millard, MD;  Location: [facility] ORS;  Service: Cardiovascular;  Laterality: N/A;   CARDIOVERSION N/A 04/21/2023   Procedure: CARDIOVERSION;  Surgeon: Marcina Millard, MD;  Location: [facility] ORS;  Service: Cardiovascular;  Laterality: N/A;   CARPAL TUNNEL RELEASE  01/2022   revision right side   COLON SURGERY Left 1980's   COLONOSCOPY W/ POLYPECTOMY  1990's   Dr Okey Dupre   COLONOSCOPY WITH PROPOFOL N/A 12/21/2014   Procedure: COLONOSCOPY WITH PROPOFOL;  Surgeon: Christena Deem, MD;  Location[facility] ENDOSCOPY;  Service: Endoscopy;  Laterality: N/A;   COLONOSCOPY WITH PROPOFOL N/A 08/16/2017   Procedure: COLONOSCOPY WITH PROPOFOL;  Surgeon: Christena Deem, MD;  Location: [facility] ENDOSCOPY;  Service: Endoscopy;  Laterality: N/A;   COLONOSCOPY WITH PROPOFOL N/A 01/31/2021   Procedure: COLONOSCOPY WITH PROPOFOL;  Surgeon: Earline Mayotte, MD;  Location: [facility] ENDOSCOPY;  Service: Endoscopy;  Laterality: N/A;   ESOPHAGOGASTRODUODENOSCOPY (EGD) WITH PROPOFOL N/A 02/15/2015   Procedure: ESOPHAGOGASTRODUODENOSCOPY (EGD) WITH PROPOFOL;  Surgeon: Christena Deem, MD;  Location: [facility] ENDOSCOPY;  Service: Endoscopy;  Laterality: N/A;   ESOPHAGOGASTRODUODENOSCOPY (EGD) WITH PROPOFOL N/A 06/10/2017   Procedure: ESOPHAGOGASTRODUODENOSCOPY (EGD) WITH PROPOFOL;  Surgeon: Christena Deem, MD;  Location: [facility] ENDOSCOPY;  Service: Endoscopy;  Laterality: N/A;   ESOPHAGOGASTRODUODENOSCOPY (EGD) WITH PROPOFOL N/A 08/16/2017   Procedure: ESOPHAGOGASTRODUODENOSCOPY (EGD) WITH PROPOFOL;  Surgeon: Christena Deem, MD;  Location: [facility] ENDOSCOPY;  Service: Endoscopy;  Laterality: N/A;   ESOPHAGOGASTRODUODENOSCOPY (EGD) WITH PROPOFOL  N/A 01/31/2021   Procedure: ESOPHAGOGASTRODUODENOSCOPY (EGD) WITH PROPOFOL;  Surgeon: Earline Mayotte, MD;  Location: [facility] ENDOSCOPY;  Service: Endoscopy;  Laterality: N/A;   EYE SURGERY     GLAUCOMA SURGERY Left 1990's   [facility]   HEMORROIDECTOMY  1980's   [facility]   HERNIA REPAIR     [facility], Dr Carol Ada HERNIA REPAIR Bilateral [phone]   LARYNX SURGERY  1990   3 times   MOHS SURGERY  01/22/2022   Dr. Verdell Face - [facility]   REPLACEMENT TOTAL KNEE Right    RETINAL DETACHMENT SURGERY Right    TONSILLECTOMY     turp  2013   URETER SURGERY  2013   Dr Achilles Dunk    Family History  Problem Relation Age of Onset   Diabetes Mother    Cataracts Mother    Metabolic syndrome Mother    Blindness Mother        r/t diabetes   Cataracts Father    Glaucoma Father        Retina-Detachment   Atrial fibrillation Father    Diverticulitis Sister    Glaucoma Brother    Diabetes Brother    Cancer Sister        Gallbladder   Cancer Daughter     Social History   Tobacco Use   Smoking status: Never   Smokeless tobacco: Never   Tobacco comments:    smoking cessation materials not required  Substance Use Topics   Alcohol use:  No     Current Outpatient Medications:    acetaminophen (TYLENOL) 500 MG tablet, Take 1,000 mg by mouth 2 (two) times daily as needed for moderate pain (pain score 4-6)., Disp: , Rfl:    apixaban (ELIQUIS) 5 MG TABS tablet, Take 5 mg by mouth 2 (two) times daily., Disp: , Rfl:    brimonidine-timolol (COMBIGAN) 0.2-0.5 % ophthalmic solution, Place 1 drop into the left eye 2 (two) times daily., Disp: , Rfl:    desonide (DESOWEN) 0.05 % ointment, Apply 1 Application topically daily as needed (irritation)., Disp: , Rfl:    diltiazem (CARDIZEM CD) 120 MG 24 hr capsule, Take 120 mg by mouth daily., Disp: , Rfl:    dorzolamide (TRUSOPT) 2 % ophthalmic solution, Place 1 drop into the left eye 2 (two) times daily., Disp: , Rfl: 3   empagliflozin  (JARDIANCE) 25 MG TABS tablet, Take 25 mg by mouth daily., Disp: , Rfl:    Exenatide ER (BYDUREON BCISE) 2 MG/0.85ML AUIJ, Inject 2 mg into the skin every Sunday., Disp: , Rfl:    finasteride (PROSCAR) 5 MG tablet, Take 1 tablet by mouth once daily (Patient taking differently: Take 5 mg by mouth every Monday, Wednesday, and Friday.), Disp: 90 tablet, Rfl: 1   flecainide (TAMBOCOR) 50 MG tablet, Take 100 mg by mouth 2 (two) times daily., Disp: , Rfl:    glipiZIDE (GLUCOTROL) 10 MG tablet, Take 10 mg by mouth daily., Disp: , Rfl:    hydrocortisone 2.5 % cream, APPLY TO THE AFFECTED AREA OF FACE AND EARS EVERY OTHER NIGHT ALTERNATING WITH KETOCONAZOLE CREAM (Patient taking differently: Apply 1 Application topically daily as needed (itching around the face).), Disp: 28 g, Rfl: 0   insulin degludec (TRESIBA FLEXTOUCH) 200 UNIT/ML FlexTouch Pen, Inject 16 Units into the skin in the morning., Disp: , Rfl:    ketoconazole (NIZORAL) 2 % cream, Apply 1 Application topically daily., Disp: , Rfl:    ketoconazole (NIZORAL) 2 % shampoo, Apply 1 Application topically once a week., Disp: , Rfl:    loperamide (IMODIUM A-D) 2 MG tablet, Take 2 mg by mouth as needed for diarrhea or loose stools., Disp: , Rfl:    loratadine (CLARITIN) 10 MG tablet, Take 10 mg by mouth daily., Disp: , Rfl:    metFORMIN (GLUCOPHAGE) 1000 MG tablet, Take 1,000 mg by mouth 2 (two) times daily with a meal. On Monday and Wednesday, Disp: , Rfl:    MOMETASONE FUROATE EX, Apply 1 Application topically daily as needed (itchy scalp)., Disp: , Rfl:    Multiple Vitamin (MULTIVITAMIN) tablet, Take 1 tablet by mouth daily., Disp: , Rfl:    omega-3 acid ethyl esters (LOVAZA) 1 g capsule, Take 2 capsules (2 g total) by mouth 2 (two) times daily., Disp: 480 capsule, Rfl: 3   omeprazole (PRILOSEC) 20 MG capsule, Take 1 capsule by mouth once daily, Disp: 90 capsule, Rfl: 0   rosuvastatin (CRESTOR) 10 MG tablet, Take 1 tablet (10 mg total) by mouth at  bedtime., Disp: 90 tablet, Rfl: 1   tadalafil (CIALIS) 20 MG tablet, TAKE 1/2 TO 1 (ONE-HALF TO ONE) TABLET BY MOUTH EVERY OTHER DAY AS NEEDED FOR ERECTILE DYSFUNCTION, Disp: 30 tablet, Rfl: 0   telmisartan-hydrochlorothiazide (MICARDIS HCT) 80-12.5 MG tablet, Take 1 tablet by mouth daily., Disp: 90 tablet, Rfl: 0  Allergies  Allergen Reactions   Demerol [Meperidine] Other (See Comments) and Anaphylaxis    MAKES BLOOD PRESSURE BOTTOM OUT hypotension   Metoprolol Shortness Of Breath  Dilaudid [Hydromorphone Hcl]     MAKES BLOOD PRESSURE BOTTOM OUT   Actos [Pioglitazone] Other (See Comments) and Hives   Dapagliflozin Rash   Penicillins Rash    I personally reviewed active problem list, medication list, allergies, family history, social history, health maintenance with the patient/caregiver today.   ROS  Ten systems reviewed and is negative except as mentioned in HPI    Objective  Vitals:   05/10/23 1309  BP: 118/80  Pulse: 95  Resp: 16  Temp: 98 F (36.7 C)  TempSrc: Oral  SpO2: 96%  Weight: 199 lb 1.6 oz (90.3 kg)  Height: 6' 3.5" (1.918 m)    Body mass index is 24.56 kg/m.  Physical Exam  Constitutional: Patient appears well-developed and well-nourished.  No distress.  HEENT: head atraumatic, normocephalic, pupils equal and reactive to light, neck supple, throat within normal limits Cardiovascular: irregular rhythm and normal heart sounds.  No murmur heard. No BLE edema. Pulmonary/Chest: Effort normal and breath sounds normal. No respiratory distress. Abdominal: Soft.  There is no tenderness. Psychiatric: Patient has a normal mood and affect. behavior is normal. Judgment and thought content normal.  Skin: very thin, ecchymosis from eliquis/senile purpura    PHQ2/9:    05/10/2023    1:11 PM 05/06/2023    2:32 PM 01/25/2023   11:43 AM 01/06/2023    2:23 PM 12/07/2022    1:24 PM  Depression screen PHQ 2/9  Decreased Interest 0 0 0 0 0  Down, Depressed,  Hopeless 0 0 0 0 0  PHQ - 2 Score 0 0 0 0 0  Altered sleeping 0 0 0 0 0  Tired, decreased energy 0 0 0 0 0  Change in appetite 0 0 0 0 0  Feeling bad or failure about yourself  0 0 0 0 0  Trouble concentrating 0 0 0 0 0  Moving slowly or fidgety/restless 0 0 0 0 0  Suicidal thoughts 0 0 0 0 0  PHQ-9 Score 0 0 0 0 0  Difficult doing work/chores  Not difficult at all Not difficult at all      phq 9 is negative   Fall Risk:    05/10/2023    1:11 PM 05/06/2023    2:38 PM 01/25/2023   11:43 AM 01/06/2023    2:23 PM 12/07/2022    1:24 PM  Fall Risk   Falls in the past year? 0 0 0 0 0  Number falls in past yr: 0 0 0  0  Injury with Fall? 0 0 0  0  Risk for fall due to : Orthopedic patient No Fall Risks No Fall Risks Impaired balance/gait Impaired balance/gait  Follow up Falls prevention discussed;Education provided;Falls evaluation completed Falls prevention discussed;Falls evaluation completed Falls prevention discussed;Education provided;Falls evaluation completed Falls prevention discussed;Education provided Falls prevention discussed      Functional Status Survey: Is the patient deaf or have difficulty hearing?: No Does the patient have difficulty seeing, even when wearing glasses/contacts?: No Does the patient have difficulty concentrating, remembering, or making decisions?: No Does the patient have difficulty walking or climbing stairs?: Yes (recent knee replacement) Does the patient have difficulty dressing or bathing?: No Does the patient have difficulty doing errands alone such as visiting a doctor's office or shopping?: No    Assessment & Plan  Assessment and Plan    Type 2 Diabetes Mellitus Improved glycemic control with A1c of 5.8%. Patient underwent knee surgery and had temporary adjustment of insulin dosing. Currently  back on pre-surgery regimen. -Continue current regimen of Tresiba 26 units daily, Glipizide 10mg  daily, Bydureon weekly, Jardiance daily, and  Metformin. -Order labs to monitor A1c and renal function.  Atrial Flutter/Fibrillation Episodes of shortness of breath and low heart rate associated with beta-blocker use. Beta-blocker discontinued with resolution of symptoms. Patient underwent two unsuccessful cardioversions. Currently on Cardizem 120mg  and Flecainide 100mg  twice daily. -Continue current regimen. -Plan for heart conduction mapping and possible ablation at [facility] in December.  Right Knee Replacement Patient reports no pain and is currently in physical therapy. -Continue physical therapy.  Dyslipidemia Patient on Rosuvastatin for cholesterol control. -Continue Rosuvastatin. -Order lipid panel to monitor cholesterol levels.  Hypertension Controlled with Telmisartan/HCTZ. -Continue Telmisartan/HCTZ.  Chronic Gastritis Patient on Omeprazole for reflux. -Renew Omeprazole prescription for 6 months.  Seborrhea Managed by dermatologist with Ketoconazole shampoo and cream. -Continue current regimen under dermatologist's care.  Basal Cell Carcinoma History of basal cell carcinoma of the face, managed by dermatologist. -Continue follow-up with dermatologist.  General Health Maintenance -Continue Eliquis for anticoagulation. -Continue Lovaza for omega-3 supplementation. -Order labs to monitor platelet count due to history of low platelets. -Continue multivitamin for history of low B12.

## 2023-05-10 ENCOUNTER — Encounter: Payer: Self-pay | Admitting: Family Medicine

## 2023-05-10 ENCOUNTER — Ambulatory Visit (INDEPENDENT_AMBULATORY_CARE_PROVIDER_SITE_OTHER): Payer: Medicare Other | Admitting: Family Medicine

## 2023-05-10 ENCOUNTER — Ambulatory Visit: Payer: Medicare Other | Admitting: Family Medicine

## 2023-05-10 VITALS — BP 118/80 | HR 95 | Temp 98.0°F | Resp 16 | Ht 75.5 in | Wt 199.1 lb

## 2023-05-10 DIAGNOSIS — D696 Thrombocytopenia, unspecified: Secondary | ICD-10-CM

## 2023-05-10 DIAGNOSIS — I10 Essential (primary) hypertension: Secondary | ICD-10-CM

## 2023-05-10 DIAGNOSIS — I700 Atherosclerosis of aorta: Secondary | ICD-10-CM | POA: Diagnosis not present

## 2023-05-10 DIAGNOSIS — E785 Hyperlipidemia, unspecified: Secondary | ICD-10-CM | POA: Diagnosis not present

## 2023-05-10 DIAGNOSIS — D692 Other nonthrombocytopenic purpura: Secondary | ICD-10-CM | POA: Diagnosis not present

## 2023-05-10 DIAGNOSIS — E782 Mixed hyperlipidemia: Secondary | ICD-10-CM | POA: Diagnosis not present

## 2023-05-10 DIAGNOSIS — Z96659 Presence of unspecified artificial knee joint: Secondary | ICD-10-CM | POA: Diagnosis not present

## 2023-05-10 DIAGNOSIS — E1159 Type 2 diabetes mellitus with other circulatory complications: Secondary | ICD-10-CM | POA: Diagnosis not present

## 2023-05-10 DIAGNOSIS — I4819 Other persistent atrial fibrillation: Secondary | ICD-10-CM | POA: Diagnosis not present

## 2023-05-10 DIAGNOSIS — E1169 Type 2 diabetes mellitus with other specified complication: Secondary | ICD-10-CM

## 2023-05-10 DIAGNOSIS — I152 Hypertension secondary to endocrine disorders: Secondary | ICD-10-CM | POA: Diagnosis not present

## 2023-05-10 DIAGNOSIS — K2930 Chronic superficial gastritis without bleeding: Secondary | ICD-10-CM

## 2023-05-10 MED ORDER — OMEPRAZOLE 20 MG PO CPDR: 20.0000 mg | DELAYED_RELEASE_CAPSULE | Freq: Every day | ORAL | 1 refills | Status: DC

## 2023-05-10 MED ORDER — TELMISARTAN-HCTZ 80-12.5 MG PO TABS: 1.0000 | ORAL_TABLET | Freq: Every day | ORAL | 1 refills | Status: DC

## 2023-05-10 MED ORDER — ROSUVASTATIN CALCIUM 10 MG PO TABS: 10.0000 mg | ORAL_TABLET | Freq: Every day | ORAL | 1 refills | Status: DC

## 2023-05-10 MED ORDER — OMEGA-3-ACID ETHYL ESTERS 1 G PO CAPS
2.0000 | ORAL_CAPSULE | Freq: Two times a day (BID) | ORAL | 3 refills | Status: AC
Start: 2023-05-10 — End: ?

## 2023-05-11 DIAGNOSIS — Z96651 Presence of right artificial knee joint: Secondary | ICD-10-CM | POA: Diagnosis not present

## 2023-05-11 DIAGNOSIS — R278 Other lack of coordination: Secondary | ICD-10-CM | POA: Diagnosis not present

## 2023-05-11 DIAGNOSIS — R2681 Unsteadiness on feet: Secondary | ICD-10-CM | POA: Diagnosis not present

## 2023-05-11 DIAGNOSIS — M6281 Muscle weakness (generalized): Secondary | ICD-10-CM | POA: Diagnosis not present

## 2023-05-11 DIAGNOSIS — Z471 Aftercare following joint replacement surgery: Secondary | ICD-10-CM | POA: Diagnosis not present

## 2023-05-11 DIAGNOSIS — R2689 Other abnormalities of gait and mobility: Secondary | ICD-10-CM | POA: Diagnosis not present

## 2023-05-14 DIAGNOSIS — R278 Other lack of coordination: Secondary | ICD-10-CM | POA: Diagnosis not present

## 2023-05-14 DIAGNOSIS — M6281 Muscle weakness (generalized): Secondary | ICD-10-CM | POA: Diagnosis not present

## 2023-05-14 DIAGNOSIS — R2681 Unsteadiness on feet: Secondary | ICD-10-CM | POA: Diagnosis not present

## 2023-05-14 DIAGNOSIS — Z471 Aftercare following joint replacement surgery: Secondary | ICD-10-CM | POA: Diagnosis not present

## 2023-05-14 DIAGNOSIS — R2689 Other abnormalities of gait and mobility: Secondary | ICD-10-CM | POA: Diagnosis not present

## 2023-05-14 DIAGNOSIS — Z96651 Presence of right artificial knee joint: Secondary | ICD-10-CM | POA: Diagnosis not present

## 2023-05-17 DIAGNOSIS — R278 Other lack of coordination: Secondary | ICD-10-CM | POA: Diagnosis not present

## 2023-05-17 DIAGNOSIS — R2681 Unsteadiness on feet: Secondary | ICD-10-CM | POA: Diagnosis not present

## 2023-05-17 DIAGNOSIS — Z471 Aftercare following joint replacement surgery: Secondary | ICD-10-CM | POA: Diagnosis not present

## 2023-05-17 DIAGNOSIS — Z96651 Presence of right artificial knee joint: Secondary | ICD-10-CM | POA: Diagnosis not present

## 2023-05-17 DIAGNOSIS — M6281 Muscle weakness (generalized): Secondary | ICD-10-CM | POA: Diagnosis not present

## 2023-05-17 DIAGNOSIS — R2689 Other abnormalities of gait and mobility: Secondary | ICD-10-CM | POA: Diagnosis not present

## 2023-05-19 DIAGNOSIS — R2689 Other abnormalities of gait and mobility: Secondary | ICD-10-CM | POA: Diagnosis not present

## 2023-05-19 DIAGNOSIS — Z471 Aftercare following joint replacement surgery: Secondary | ICD-10-CM | POA: Diagnosis not present

## 2023-05-19 DIAGNOSIS — Z96651 Presence of right artificial knee joint: Secondary | ICD-10-CM | POA: Diagnosis not present

## 2023-05-19 DIAGNOSIS — M6281 Muscle weakness (generalized): Secondary | ICD-10-CM | POA: Diagnosis not present

## 2023-05-19 DIAGNOSIS — R2681 Unsteadiness on feet: Secondary | ICD-10-CM | POA: Diagnosis not present

## 2023-05-19 DIAGNOSIS — R278 Other lack of coordination: Secondary | ICD-10-CM | POA: Diagnosis not present

## 2023-05-21 DIAGNOSIS — R278 Other lack of coordination: Secondary | ICD-10-CM | POA: Diagnosis not present

## 2023-05-21 DIAGNOSIS — M6281 Muscle weakness (generalized): Secondary | ICD-10-CM | POA: Diagnosis not present

## 2023-05-21 DIAGNOSIS — R2689 Other abnormalities of gait and mobility: Secondary | ICD-10-CM | POA: Diagnosis not present

## 2023-05-21 DIAGNOSIS — R2681 Unsteadiness on feet: Secondary | ICD-10-CM | POA: Diagnosis not present

## 2023-05-21 DIAGNOSIS — Z96651 Presence of right artificial knee joint: Secondary | ICD-10-CM | POA: Diagnosis not present

## 2023-05-21 DIAGNOSIS — Z471 Aftercare following joint replacement surgery: Secondary | ICD-10-CM | POA: Diagnosis not present

## 2023-05-24 DIAGNOSIS — Z471 Aftercare following joint replacement surgery: Secondary | ICD-10-CM | POA: Diagnosis not present

## 2023-05-24 DIAGNOSIS — R2689 Other abnormalities of gait and mobility: Secondary | ICD-10-CM | POA: Diagnosis not present

## 2023-05-24 DIAGNOSIS — R2681 Unsteadiness on feet: Secondary | ICD-10-CM | POA: Diagnosis not present

## 2023-05-24 DIAGNOSIS — R278 Other lack of coordination: Secondary | ICD-10-CM | POA: Diagnosis not present

## 2023-05-24 DIAGNOSIS — M6281 Muscle weakness (generalized): Secondary | ICD-10-CM | POA: Diagnosis not present

## 2023-05-24 DIAGNOSIS — Z96651 Presence of right artificial knee joint: Secondary | ICD-10-CM | POA: Diagnosis not present

## 2023-05-28 DIAGNOSIS — Z471 Aftercare following joint replacement surgery: Secondary | ICD-10-CM | POA: Diagnosis not present

## 2023-05-28 DIAGNOSIS — R278 Other lack of coordination: Secondary | ICD-10-CM | POA: Diagnosis not present

## 2023-05-28 DIAGNOSIS — M6281 Muscle weakness (generalized): Secondary | ICD-10-CM | POA: Diagnosis not present

## 2023-05-28 DIAGNOSIS — R2681 Unsteadiness on feet: Secondary | ICD-10-CM | POA: Diagnosis not present

## 2023-05-28 DIAGNOSIS — R2689 Other abnormalities of gait and mobility: Secondary | ICD-10-CM | POA: Diagnosis not present

## 2023-05-28 DIAGNOSIS — Z96651 Presence of right artificial knee joint: Secondary | ICD-10-CM | POA: Diagnosis not present

## 2023-05-31 DIAGNOSIS — Z471 Aftercare following joint replacement surgery: Secondary | ICD-10-CM | POA: Diagnosis not present

## 2023-05-31 DIAGNOSIS — R2681 Unsteadiness on feet: Secondary | ICD-10-CM | POA: Diagnosis not present

## 2023-05-31 DIAGNOSIS — M6281 Muscle weakness (generalized): Secondary | ICD-10-CM | POA: Diagnosis not present

## 2023-05-31 DIAGNOSIS — Z96651 Presence of right artificial knee joint: Secondary | ICD-10-CM | POA: Diagnosis not present

## 2023-05-31 DIAGNOSIS — R278 Other lack of coordination: Secondary | ICD-10-CM | POA: Diagnosis not present

## 2023-05-31 DIAGNOSIS — R2689 Other abnormalities of gait and mobility: Secondary | ICD-10-CM | POA: Diagnosis not present

## 2023-06-02 DIAGNOSIS — R0789 Other chest pain: Secondary | ICD-10-CM | POA: Diagnosis not present

## 2023-06-02 DIAGNOSIS — I4819 Other persistent atrial fibrillation: Secondary | ICD-10-CM | POA: Diagnosis not present

## 2023-06-03 DIAGNOSIS — Z794 Long term (current) use of insulin: Secondary | ICD-10-CM | POA: Diagnosis not present

## 2023-06-03 DIAGNOSIS — R1011 Right upper quadrant pain: Secondary | ICD-10-CM | POA: Diagnosis not present

## 2023-06-03 DIAGNOSIS — I4819 Other persistent atrial fibrillation: Secondary | ICD-10-CM | POA: Diagnosis not present

## 2023-06-03 DIAGNOSIS — Z800 Family history of malignant neoplasm of digestive organs: Secondary | ICD-10-CM | POA: Insufficient documentation

## 2023-06-03 DIAGNOSIS — D122 Benign neoplasm of ascending colon: Secondary | ICD-10-CM | POA: Diagnosis not present

## 2023-06-03 DIAGNOSIS — Z7901 Long term (current) use of anticoagulants: Secondary | ICD-10-CM | POA: Insufficient documentation

## 2023-06-03 DIAGNOSIS — D696 Thrombocytopenia, unspecified: Secondary | ICD-10-CM | POA: Diagnosis not present

## 2023-06-03 DIAGNOSIS — E1142 Type 2 diabetes mellitus with diabetic polyneuropathy: Secondary | ICD-10-CM | POA: Diagnosis not present

## 2023-06-03 HISTORY — DX: Family history of malignant neoplasm of digestive organs: Z80.0

## 2023-06-04 DIAGNOSIS — R2681 Unsteadiness on feet: Secondary | ICD-10-CM | POA: Diagnosis not present

## 2023-06-04 DIAGNOSIS — Z96651 Presence of right artificial knee joint: Secondary | ICD-10-CM | POA: Diagnosis not present

## 2023-06-04 DIAGNOSIS — Z471 Aftercare following joint replacement surgery: Secondary | ICD-10-CM | POA: Diagnosis not present

## 2023-06-04 DIAGNOSIS — R2689 Other abnormalities of gait and mobility: Secondary | ICD-10-CM | POA: Diagnosis not present

## 2023-06-04 DIAGNOSIS — M6281 Muscle weakness (generalized): Secondary | ICD-10-CM | POA: Diagnosis not present

## 2023-06-04 DIAGNOSIS — R278 Other lack of coordination: Secondary | ICD-10-CM | POA: Diagnosis not present

## 2023-06-07 DIAGNOSIS — R2681 Unsteadiness on feet: Secondary | ICD-10-CM | POA: Diagnosis not present

## 2023-06-07 DIAGNOSIS — R2689 Other abnormalities of gait and mobility: Secondary | ICD-10-CM | POA: Diagnosis not present

## 2023-06-07 DIAGNOSIS — Z471 Aftercare following joint replacement surgery: Secondary | ICD-10-CM | POA: Diagnosis not present

## 2023-06-07 DIAGNOSIS — Z96651 Presence of right artificial knee joint: Secondary | ICD-10-CM | POA: Diagnosis not present

## 2023-06-07 DIAGNOSIS — M6281 Muscle weakness (generalized): Secondary | ICD-10-CM | POA: Diagnosis not present

## 2023-06-07 DIAGNOSIS — R278 Other lack of coordination: Secondary | ICD-10-CM | POA: Diagnosis not present

## 2023-06-11 DIAGNOSIS — M6281 Muscle weakness (generalized): Secondary | ICD-10-CM | POA: Diagnosis not present

## 2023-06-11 DIAGNOSIS — R278 Other lack of coordination: Secondary | ICD-10-CM | POA: Diagnosis not present

## 2023-06-11 DIAGNOSIS — R2689 Other abnormalities of gait and mobility: Secondary | ICD-10-CM | POA: Diagnosis not present

## 2023-06-11 DIAGNOSIS — R2681 Unsteadiness on feet: Secondary | ICD-10-CM | POA: Diagnosis not present

## 2023-06-11 DIAGNOSIS — Z96651 Presence of right artificial knee joint: Secondary | ICD-10-CM | POA: Diagnosis not present

## 2023-06-11 DIAGNOSIS — Z471 Aftercare following joint replacement surgery: Secondary | ICD-10-CM | POA: Diagnosis not present

## 2023-06-14 DIAGNOSIS — B351 Tinea unguium: Secondary | ICD-10-CM | POA: Diagnosis not present

## 2023-06-14 DIAGNOSIS — E1142 Type 2 diabetes mellitus with diabetic polyneuropathy: Secondary | ICD-10-CM | POA: Diagnosis not present

## 2023-06-18 DIAGNOSIS — R278 Other lack of coordination: Secondary | ICD-10-CM | POA: Diagnosis not present

## 2023-06-18 DIAGNOSIS — R2681 Unsteadiness on feet: Secondary | ICD-10-CM | POA: Diagnosis not present

## 2023-06-18 DIAGNOSIS — R2689 Other abnormalities of gait and mobility: Secondary | ICD-10-CM | POA: Diagnosis not present

## 2023-06-18 DIAGNOSIS — M6281 Muscle weakness (generalized): Secondary | ICD-10-CM | POA: Diagnosis not present

## 2023-06-18 DIAGNOSIS — Z96651 Presence of right artificial knee joint: Secondary | ICD-10-CM | POA: Diagnosis not present

## 2023-06-18 DIAGNOSIS — Z471 Aftercare following joint replacement surgery: Secondary | ICD-10-CM | POA: Diagnosis not present

## 2023-06-21 ENCOUNTER — Ambulatory Visit (INDEPENDENT_AMBULATORY_CARE_PROVIDER_SITE_OTHER): Payer: Medicare Other | Admitting: Family Medicine

## 2023-06-21 ENCOUNTER — Encounter: Payer: Self-pay | Admitting: Family Medicine

## 2023-06-21 VITALS — BP 124/76 | HR 101 | Temp 97.5°F | Resp 18 | Ht 75.5 in | Wt 205.4 lb

## 2023-06-21 DIAGNOSIS — Z7901 Long term (current) use of anticoagulants: Secondary | ICD-10-CM | POA: Diagnosis not present

## 2023-06-21 DIAGNOSIS — Z96651 Presence of right artificial knee joint: Secondary | ICD-10-CM

## 2023-06-21 DIAGNOSIS — E1169 Type 2 diabetes mellitus with other specified complication: Secondary | ICD-10-CM | POA: Diagnosis not present

## 2023-06-21 DIAGNOSIS — D692 Other nonthrombocytopenic purpura: Secondary | ICD-10-CM

## 2023-06-21 DIAGNOSIS — I4819 Other persistent atrial fibrillation: Secondary | ICD-10-CM

## 2023-06-21 DIAGNOSIS — I10 Essential (primary) hypertension: Secondary | ICD-10-CM | POA: Diagnosis not present

## 2023-06-21 DIAGNOSIS — E785 Hyperlipidemia, unspecified: Secondary | ICD-10-CM | POA: Diagnosis not present

## 2023-06-21 DIAGNOSIS — D696 Thrombocytopenia, unspecified: Secondary | ICD-10-CM

## 2023-06-21 NOTE — Progress Notes (Signed)
 Name: Jeremy [name]   MRN: [id number]    DOB: 12/08/1935   Date:06/21/2023       Progress Note  Subjective  Chief Complaint  Chief Complaint  Patient presents with   Medical Management of Chronic Issues    HPI Discussed the use of AI scribe software for clinical note transcription with the patient, who gave verbal consent to proceed.  History of Present Illness   The patient, with a history of atrial fibrillation and recent knee surgery, reports episodes of shortness of breath prior to the knee surgery. These episodes were associated with a heart rate of 20-30 beats per minute. The shortness of breath resolved after discontinuation of a beta blocker. However, the patient continued to experience atrial fibrillation. He was started on Cardizem and Flecainide. He also had two unsuccessful cardioversion and is going for an ablation in Feb  The patient also reports taking Eliquis for atrial fibrillation, which has resulted in easy bruising and minor bleeding, possibly from hemorrhoids. He denies any gross hematuria or melena.  Regarding his recent knee surgery, the patient reports good progress with physical therapy, achieving near full range of motion. He is working on improving balance and learning to walk properly post-surgery. He denies any post-operative anemia or other complications.          Patient Active Problem List   Diagnosis Date Noted   Insomnia 03/11/2023   Anemia 03/11/2023   Hypertension, benign 03/11/2023   Type 2 diabetes mellitus with microalbuminuria, with long-term current use of insulin (HCC) 03/11/2023   History of total knee replacement 03/11/2023   Persistent atrial fibrillation (HCC) 02/16/2023   Thrombocytopenia (HCC) 06/03/2022   B12 deficiency 06/03/2022   Basal cell carcinoma (BCC) of skin of face 11/03/2021   Dyslipidemia associated with type 2 diabetes mellitus (HCC) 11/03/2021   Impingement syndrome of shoulder region 11/16/2018   Atherosclerosis of  aorta (HCC) 07/21/2018   History of diverticulitis of colon 11/15/2017   Hyperlipidemia due to type 2 diabetes mellitus (HCC) 03/14/2017   Bilateral lower extremity edema 03/27/2016   Vitamin D  deficiency 12/05/2015   Hx of transient ischemic attack (TIA) 09/17/2015   Diabetes mellitus type 2 without retinopathy (HCC) 05/31/2015   Hyperlipidemia 05/30/2015   Type 2 diabetes mellitus with renal manifestations (HCC) 01/08/2015   Incisional hernia, without obstruction or gangrene 03/23/2013   Enlarged prostate with lower urinary tract symptoms (LUTS) 11/03/2012   Myopic degeneration, bilateral 08/12/2012   Nonexudative age-related macular degeneration 07/15/2012   Anisometropia 04/08/2012   Pseudophakia 04/08/2012   History of retinal detachment 04/08/2012   Presence of intraocular lens 04/08/2012   Secondary open-angle glaucoma of left eye, severe stage 01/13/2012   Benign essential HTN 02/03/2007   Hypertension associated with diabetes (HCC) 02/03/2007    Past Surgical History:  Procedure Laterality Date   APPENDECTOMY     BLADDER DIVERTICULECTOMY  2013   CARDIOVERSION N/A 02/24/2023   Procedure: CARDIOVERSION;  Surgeon: Ammon Blunt, MD;  Location: [facility] ORS;  Service: Cardiovascular;  Laterality: N/A;   CARDIOVERSION N/A 04/21/2023   Procedure: CARDIOVERSION;  Surgeon: Ammon Blunt, MD;  Location: [facility]S;  Service: Cardiovascular;  Laterality: N/A;   CARPAL TUNNEL RELEASE  01/2022   revision right side   COLON SURGERY Left 1980's   COLONOSCOPY W/ POLYPECTOMY  1990's   Dr Rollene   COLONOSCOPY WITH PROPOFOL  N/A 12/21/2014   Procedure: COLONOSCOPY WITH PROPOFOL ;  Surgeon: Gladis RAYMOND Mariner, MD;  Location: [facility] ENDOSCOPY;  Service: Endoscopy;  Laterality: N/A;   COLONOSCOPY WITH PROPOFOL  N/A 08/16/2017   Procedure: COLONOSCOPY WITH PROPOFOL ;  Surgeon: Gaylyn Gladis PENNER, MD;  Location: [facility] ENDOSCOPY;  Service: Endoscopy;  Laterality: N/A;   COLONOSCOPY WITH  PROPOFOL  N/A 01/31/2021   Procedure: COLONOSCOPY WITH PROPOFOL ;  Surgeon: Dessa Reyes ORN, MD;  Location: [facility] ENDOSCOPY;  Service: Endoscopy;  Laterality: N/A;   ESOPHAGOGASTRODUODENOSCOPY (EGD) WITH PROPOFOL  N/A 02/15/2015   Procedure: ESOPHAGOGASTRODUODENOSCOPY (EGD) WITH PROPOFOL ;  Surgeon: Gladis PENNER Gaylyn, MD;  Location: [facility] ENDOSCOPY;  Service: Endoscopy;  Laterality: N/A;   ESOPHAGOGASTRODUODENOSCOPY (EGD) WITH PROPOFOL  N/A 06/10/2017   Procedure: ESOPHAGOGASTRODUODENOSCOPY (EGD) WITH PROPOFOL ;  Surgeon: Gaylyn Gladis PENNER, MD;  Location: [facility] ENDOSCOPY;  Service: Endoscopy;  Laterality: N/A;   ESOPHAGOGASTRODUODENOSCOPY (EGD) WITH PROPOFOL  N/A 08/16/2017   Procedure: ESOPHAGOGASTRODUODENOSCOPY (EGD) WITH PROPOFOL ;  Surgeon: Gaylyn Gladis PENNER, MD;  Location: [facility] ENDOSCOPY;  Service: Endoscopy;  Laterality: N/A;   ESOPHAGOGASTRODUODENOSCOPY (EGD) WITH PROPOFOL  N/A 01/31/2021   Procedure: ESOPHAGOGASTRODUODENOSCOPY (EGD) WITH PROPOFOL ;  Surgeon: Dessa Reyes ORN, MD;  Location: [facility] ENDOSCOPY;  Service: Endoscopy;  Laterality: N/A;   EYE SURGERY     GLAUCOMA SURGERY Left 1990's   [facility]   HEMORROIDECTOMY  [phone]   [facility]   HERNIA REPAIR     [facility], Dr Rollene FONTANA HERNIA REPAIR Bilateral [phone]   LARYNX SURGERY  1990   3 times   MOHS SURGERY  01/22/2022   Dr. Timm - [facility]   REPLACEMENT TOTAL KNEE Right    RETINAL DETACHMENT SURGERY Right    TONSILLECTOMY     turp  2013   URETER SURGERY  2013   Dr Ike    Family History  Problem Relation Age of Onset   Diabetes Mother    Cataracts Mother    Metabolic syndrome Mother    Blindness Mother        r/t diabetes   Cataracts Father    Glaucoma Father        Retina-Detachment   Atrial fibrillation Father    Diverticulitis Sister    Glaucoma Brother    Diabetes Brother    Cancer Sister        Gallbladder   Cancer Daughter     Social History   Tobacco Use   Smoking status: Never    Smokeless tobacco: Never   Tobacco comments:    smoking cessation materials not required  Substance Use Topics   Alcohol use: No     Current Outpatient Medications:    acetaminophen  (TYLENOL ) 500 MG tablet, Take 1,000 mg by mouth 2 (two) times daily as needed for moderate pain (pain score 4-6)., Disp: , Rfl:    apixaban (ELIQUIS) 5 MG TABS tablet, Take 5 mg by mouth 2 (two) times daily., Disp: , Rfl:    brimonidine-timolol (COMBIGAN) 0.2-0.5 % ophthalmic solution, Place 1 drop into the left eye 2 (two) times daily., Disp: , Rfl:    desonide  (DESOWEN ) 0.05 % ointment, Apply 1 Application topically daily as needed (irritation)., Disp: , Rfl:    diltiazem (CARDIZEM CD) 120 MG 24 hr capsule, Take 120 mg by mouth daily., Disp: , Rfl:    dorzolamide (TRUSOPT) 2 % ophthalmic solution, Place 1 drop into the left eye 2 (two) times daily., Disp: , Rfl: 3   empagliflozin (JARDIANCE) 25 MG TABS tablet, Take 25 mg by mouth daily., Disp: , Rfl:    Exenatide  ER (BYDUREON  BCISE) 2 MG/0.85ML AUIJ, Inject 2 mg into the skin every Sunday., Disp: , Rfl:  finasteride  (PROSCAR ) 5 MG tablet, Take 1 tablet by mouth once daily (Patient taking differently: Take 5 mg by mouth every Monday, Wednesday, and Friday.), Disp: 90 tablet, Rfl: 1   flecainide (TAMBOCOR) 50 MG tablet, Take 100 mg by mouth 2 (two) times daily., Disp: , Rfl:    glipiZIDE (GLUCOTROL) 10 MG tablet, Take 10 mg by mouth daily., Disp: , Rfl:    hydrocortisone  2.5 % cream, APPLY TO THE AFFECTED AREA OF FACE AND EARS EVERY OTHER NIGHT ALTERNATING WITH KETOCONAZOLE  CREAM (Patient taking differently: Apply 1 Application topically daily as needed (itching around the face).), Disp: 28 g, Rfl: 0   insulin degludec (TRESIBA FLEXTOUCH) 200 UNIT/ML FlexTouch Pen, Inject 16 Units into the skin in the morning., Disp: , Rfl:    ketoconazole  (NIZORAL ) 2 % cream, Apply 1 Application topically daily., Disp: , Rfl:    ketoconazole  (NIZORAL ) 2 % shampoo, Apply 1  Application topically once a week., Disp: , Rfl:    loperamide (IMODIUM A-D) 2 MG tablet, Take 2 mg by mouth as needed for diarrhea or loose stools., Disp: , Rfl:    loratadine (CLARITIN) 10 MG tablet, Take 10 mg by mouth daily., Disp: , Rfl:    metFORMIN  (GLUCOPHAGE ) 1000 MG tablet, Take 1,000 mg by mouth 2 (two) times daily with a meal. On Monday and Wednesday, Disp: , Rfl:    MOMETASONE  FUROATE EX, Apply 1 Application topically daily as needed (itchy scalp)., Disp: , Rfl:    Multiple Vitamin (MULTIVITAMIN) tablet, Take 1 tablet by mouth daily., Disp: , Rfl:    omega-3 acid ethyl esters (LOVAZA ) 1 g capsule, Take 2 capsules (2 g total) by mouth 2 (two) times daily., Disp: 480 capsule, Rfl: 3   omeprazole  (PRILOSEC) 20 MG capsule, Take 1 capsule (20 mg total) by mouth daily., Disp: 90 capsule, Rfl: 1   rosuvastatin  (CRESTOR ) 10 MG tablet, Take 1 tablet (10 mg total) by mouth at bedtime., Disp: 90 tablet, Rfl: 1   tadalafil  (CIALIS ) 20 MG tablet, TAKE 1/2 TO 1 (ONE-HALF TO ONE) TABLET BY MOUTH EVERY OTHER DAY AS NEEDED FOR ERECTILE DYSFUNCTION, Disp: 30 tablet, Rfl: 0   telmisartan -hydrochlorothiazide  (MICARDIS  HCT) 80-12.5 MG tablet, Take 1 tablet by mouth daily., Disp: 90 tablet, Rfl: 1  Allergies  Allergen Reactions   Demerol [Meperidine] Other (See Comments) and Anaphylaxis    MAKES BLOOD PRESSURE BOTTOM OUT hypotension   Metoprolol Shortness Of Breath   Dilaudid [Hydromorphone Hcl]     MAKES BLOOD PRESSURE BOTTOM OUT   Actos [Pioglitazone] Other (See Comments) and Hives   Dapagliflozin Rash   Penicillins Rash    I personally reviewed active problem list, medication list, allergies with the patient/caregiver today.   ROS  Ten systems reviewed and is negative except as mentioned in HPI    Objective  Vitals:   06/21/23 1404  BP: 124/76  Pulse: (!) 101  Resp: 18  Temp: (!) 97.5 F (36.4 C)  TempSrc: Oral  SpO2: 95%  Weight: 205 lb 6.4 oz (93.2 kg)  Height: 6' 3.5 (1.918  m)    Body mass index is 25.33 kg/m.  Physical Exam  Constitutional: Patient appears well-developed and well-nourished.  No distress.  HEENT: head atraumatic, normocephalic, pupils equal and reactive to light, neck supple Cardiovascular: Normal rate, irregular rhythm and normal heart sounds.  No murmur heard. No BLE edema. Pulmonary/Chest: Effort normal and breath sounds normal. No respiratory distress. Abdominal: Soft.  There is no tenderness. Muscular skeletal: well healed scar from  knee replacement Psychiatric: Patient has a normal mood and affect. behavior is normal. Judgment and thought content normal.  Skin: senile purpura  Recent Results (from the past 2160 hours)  Lipase, blood     Status: None   Collection Time: 04/08/23  2:37 PM  Result Value Ref Range   Lipase 33 11 - 51 U/L    Comment: Performed at [facility], [address] [place], [place] [address]  Comprehensive metabolic panel     Status: Abnormal   Collection Time: 04/08/23  2:37 PM  Result Value Ref Range   Sodium 135 135 - 145 mmol/L   Potassium 4.2 3.5 - 5.1 mmol/L   Chloride 99 98 - 111 mmol/L   CO2 24 22 - 32 mmol/L   Glucose, Bld 120 (H) 70 - 99 mg/dL    Comment: Glucose reference range applies only to samples taken after fasting for at least 8 hours.   BUN 17 8 - 23 mg/dL   Creatinine, Ser 9.33 0.61 - 1.24 mg/dL   Calcium  9.0 8.9 - 10.3 mg/dL   Total Protein 7.8 6.5 - 8.1 g/dL   Albumin 3.9 3.5 - 5.0 g/dL   AST 16 15 - 41 U/L   ALT 11 0 - 44 U/L   Alkaline Phosphatase 62 38 - 126 U/L   Total Bilirubin 1.0 0.3 - 1.2 mg/dL   GFR, Estimated >39 >39 mL/min    Comment: (NOTE) Calculated using the CKD-EPI Creatinine Equation (2021)    Anion gap 12 5 - 15    Comment: Performed at [facility], [address] [place], [place] [address]  CBC with Differential     Status: Abnormal   Collection Time: 04/08/23  2:37 PM  Result Value Ref Range   WBC 4.9 4.0 - 10.5 K/uL   RBC 4.02  (L) 4.22 - 5.81 MIL/uL   Hemoglobin 13.2 13.0 - 17.0 g/dL   HCT 59.8 60.9 - 47.9 %   MCV 99.8 80.0 - 100.0 fL   MCH 32.8 26.0 - 34.0 pg   MCHC 32.9 30.0 - 36.0 g/dL   RDW 83.4 (H) 88.4 - 84.4 %   Platelets 133 (L) 150 - 400 K/uL   nRBC 0.0 0.0 - 0.2 %   Neutrophils Relative % 53 %   Neutro Abs 2.6 1.7 - 7.7 K/uL   Lymphocytes Relative 25 %   Lymphs Abs 1.3 0.7 - 4.0 K/uL   Monocytes Relative 20 %   Monocytes Absolute 1.0 0.1 - 1.0 K/uL   Eosinophils Relative 1 %   Eosinophils Absolute 0.1 0.0 - 0.5 K/uL   Basophils Relative 0 %   Basophils Absolute 0.0 0.0 - 0.1 K/uL   Immature Granulocytes 1 %   Abs Immature Granulocytes 0.03 0.00 - 0.07 K/uL    Comment: Performed at [facility], [address] [place], [place] [address]    Diabetic Foot Exam:     PHQ2/9:    06/21/2023    2:05 PM 05/10/2023    1:11 PM 05/06/2023    2:32 PM 01/25/2023   11:43 AM 01/06/2023    2:23 PM  Depression screen PHQ 2/9  Decreased Interest 0 0 0 0 0  Down, Depressed, Hopeless 0 0 0 0 0  PHQ - 2 Score 0 0 0 0 0  Altered sleeping 0 0 0 0 0  Tired, decreased energy 0 0 0 0 0  Change in appetite 0 0 0 0 0  Feeling bad or  failure about yourself  0 0 0 0 0  Trouble concentrating 0 0 0 0 0  Moving slowly or fidgety/restless 0 0 0 0 0  Suicidal thoughts 0 0 0 0 0  PHQ-9 Score 0 0 0 0 0  Difficult doing work/chores Not difficult at all  Not difficult at all Not difficult at all     phq 9 is negative   Assessment & Plan     Atrial Fibrillation Persistent despite cardioversion and medical management with Cardizem and Flecainide. Ablation scheduled for 08/03/2023. -Continue Cardizem and Flecainide 100mg  BID. -Undergo chest MRI on 08/02/2023 in preparation for ablation.  Anticoagulation/Thrombocytopenia On Eliquis for stroke prevention in the setting of atrial fibrillation. Noted easy bruising but no significant bleeding. -Continue Eliquis 2 pills daily.  Post-operative Knee  Surgery Full range of motion, working on balance and gait in physical therapy. -Continue physical therapy as directed.  General Health Maintenance -Complete lab work today for cholesterol, protein, urine, sugar, kidney function, and CBC. -Endocrinologist to check A1c tomorrow. -Follow-up after ablation procedure.

## 2023-06-22 DIAGNOSIS — E1169 Type 2 diabetes mellitus with other specified complication: Secondary | ICD-10-CM | POA: Diagnosis not present

## 2023-06-22 DIAGNOSIS — I152 Hypertension secondary to endocrine disorders: Secondary | ICD-10-CM | POA: Diagnosis not present

## 2023-06-22 DIAGNOSIS — E1142 Type 2 diabetes mellitus with diabetic polyneuropathy: Secondary | ICD-10-CM | POA: Diagnosis not present

## 2023-06-22 DIAGNOSIS — E785 Hyperlipidemia, unspecified: Secondary | ICD-10-CM | POA: Diagnosis not present

## 2023-06-22 DIAGNOSIS — E1159 Type 2 diabetes mellitus with other circulatory complications: Secondary | ICD-10-CM | POA: Diagnosis not present

## 2023-06-22 DIAGNOSIS — Z794 Long term (current) use of insulin: Secondary | ICD-10-CM | POA: Diagnosis not present

## 2023-06-22 LAB — CBC WITH DIFFERENTIAL/PLATELET
Absolute Lymphocytes: 994 {cells}/uL (ref 850–3900)
Absolute Monocytes: 666 {cells}/uL (ref 200–950)
Basophils Absolute: 11 {cells}/uL (ref 0–200)
Basophils Relative: 0.3 %
Eosinophils Absolute: 11 {cells}/uL — ABNORMAL LOW (ref 15–500)
Eosinophils Relative: 0.3 %
HCT: 40.6 % (ref 38.5–50.0)
Hemoglobin: 13.5 g/dL (ref 13.2–17.1)
MCH: 33 pg (ref 27.0–33.0)
MCHC: 33.3 g/dL (ref 32.0–36.0)
MCV: 99.3 fL (ref 80.0–100.0)
MPV: 11.6 fL (ref 7.5–12.5)
Monocytes Relative: 18.5 %
Neutro Abs: 1919 {cells}/uL (ref 1500–7800)
Neutrophils Relative %: 53.3 %
Platelets: 101 10*3/uL — ABNORMAL LOW (ref 140–400)
RBC: 4.09 10*6/uL — ABNORMAL LOW (ref 4.20–5.80)
RDW: 12.9 % (ref 11.0–15.0)
Total Lymphocyte: 27.6 %
WBC: 3.6 10*3/uL — ABNORMAL LOW (ref 3.8–10.8)

## 2023-06-22 LAB — COMPLETE METABOLIC PANEL WITH GFR
AG Ratio: 1.9 (calc) (ref 1.0–2.5)
ALT: 8 U/L — ABNORMAL LOW (ref 9–46)
AST: 14 U/L (ref 10–35)
Albumin: 4.5 g/dL (ref 3.6–5.1)
Alkaline phosphatase (APISO): 50 U/L (ref 35–144)
BUN: 22 mg/dL (ref 7–25)
CO2: 28 mmol/L (ref 20–32)
Calcium: 9 mg/dL (ref 8.6–10.3)
Chloride: 103 mmol/L (ref 98–110)
Creat: 0.86 mg/dL (ref 0.70–1.22)
Globulin: 2.4 g/dL (ref 1.9–3.7)
Glucose, Bld: 119 mg/dL — ABNORMAL HIGH (ref 65–99)
Potassium: 4.2 mmol/L (ref 3.5–5.3)
Sodium: 141 mmol/L (ref 135–146)
Total Bilirubin: 0.6 mg/dL (ref 0.2–1.2)
Total Protein: 6.9 g/dL (ref 6.1–8.1)
eGFR: 85 mL/min/{1.73_m2} (ref >=60)

## 2023-06-22 LAB — LIPID PANEL
Cholesterol: 89 mg/dL (ref <200)
HDL: 33 mg/dL — ABNORMAL LOW (ref >=40)
LDL Cholesterol (Calc): 36 mg/dL
Non-HDL Cholesterol (Calc): 56 mg/dL (ref <130)
Total CHOL/HDL Ratio: 2.7 (calc) (ref <5.0)
Triglycerides: 117 mg/dL (ref <150)

## 2023-06-22 LAB — MICROALBUMIN / CREATININE URINE RATIO
Creatinine, Urine: 44 mg/dL (ref 20–320)
Microalb Creat Ratio: 211 mg/g{creat} — ABNORMAL HIGH (ref <30)
Microalb, Ur: 9.3 mg/dL

## 2023-06-23 DIAGNOSIS — R2689 Other abnormalities of gait and mobility: Secondary | ICD-10-CM | POA: Diagnosis not present

## 2023-06-23 DIAGNOSIS — Z96651 Presence of right artificial knee joint: Secondary | ICD-10-CM | POA: Diagnosis not present

## 2023-06-23 DIAGNOSIS — Z471 Aftercare following joint replacement surgery: Secondary | ICD-10-CM | POA: Diagnosis not present

## 2023-06-23 DIAGNOSIS — M6281 Muscle weakness (generalized): Secondary | ICD-10-CM | POA: Diagnosis not present

## 2023-06-23 DIAGNOSIS — R2681 Unsteadiness on feet: Secondary | ICD-10-CM | POA: Diagnosis not present

## 2023-06-23 DIAGNOSIS — R278 Other lack of coordination: Secondary | ICD-10-CM | POA: Diagnosis not present

## 2023-06-25 DIAGNOSIS — R2681 Unsteadiness on feet: Secondary | ICD-10-CM | POA: Diagnosis not present

## 2023-06-25 DIAGNOSIS — R278 Other lack of coordination: Secondary | ICD-10-CM | POA: Diagnosis not present

## 2023-06-25 DIAGNOSIS — Z96651 Presence of right artificial knee joint: Secondary | ICD-10-CM | POA: Diagnosis not present

## 2023-06-25 DIAGNOSIS — M6281 Muscle weakness (generalized): Secondary | ICD-10-CM | POA: Diagnosis not present

## 2023-06-25 DIAGNOSIS — Z471 Aftercare following joint replacement surgery: Secondary | ICD-10-CM | POA: Diagnosis not present

## 2023-06-25 DIAGNOSIS — R2689 Other abnormalities of gait and mobility: Secondary | ICD-10-CM | POA: Diagnosis not present

## 2023-06-28 DIAGNOSIS — R278 Other lack of coordination: Secondary | ICD-10-CM | POA: Diagnosis not present

## 2023-06-28 DIAGNOSIS — R2689 Other abnormalities of gait and mobility: Secondary | ICD-10-CM | POA: Diagnosis not present

## 2023-06-28 DIAGNOSIS — Z471 Aftercare following joint replacement surgery: Secondary | ICD-10-CM | POA: Diagnosis not present

## 2023-06-28 DIAGNOSIS — M6281 Muscle weakness (generalized): Secondary | ICD-10-CM | POA: Diagnosis not present

## 2023-06-28 DIAGNOSIS — R2681 Unsteadiness on feet: Secondary | ICD-10-CM | POA: Diagnosis not present

## 2023-06-28 DIAGNOSIS — Z96651 Presence of right artificial knee joint: Secondary | ICD-10-CM | POA: Diagnosis not present

## 2023-06-29 ENCOUNTER — Other Ambulatory Visit: Payer: Self-pay | Admitting: Family Medicine

## 2023-06-29 DIAGNOSIS — N400 Enlarged prostate without lower urinary tract symptoms: Secondary | ICD-10-CM

## 2023-06-30 DIAGNOSIS — Z471 Aftercare following joint replacement surgery: Secondary | ICD-10-CM | POA: Diagnosis not present

## 2023-06-30 DIAGNOSIS — M6281 Muscle weakness (generalized): Secondary | ICD-10-CM | POA: Diagnosis not present

## 2023-06-30 DIAGNOSIS — R278 Other lack of coordination: Secondary | ICD-10-CM | POA: Diagnosis not present

## 2023-06-30 DIAGNOSIS — Z96651 Presence of right artificial knee joint: Secondary | ICD-10-CM | POA: Diagnosis not present

## 2023-06-30 DIAGNOSIS — R2689 Other abnormalities of gait and mobility: Secondary | ICD-10-CM | POA: Diagnosis not present

## 2023-06-30 DIAGNOSIS — R2681 Unsteadiness on feet: Secondary | ICD-10-CM | POA: Diagnosis not present

## 2023-07-02 DIAGNOSIS — M6281 Muscle weakness (generalized): Secondary | ICD-10-CM | POA: Diagnosis not present

## 2023-07-02 DIAGNOSIS — R2681 Unsteadiness on feet: Secondary | ICD-10-CM | POA: Diagnosis not present

## 2023-07-02 DIAGNOSIS — Z96651 Presence of right artificial knee joint: Secondary | ICD-10-CM | POA: Diagnosis not present

## 2023-07-02 DIAGNOSIS — Z471 Aftercare following joint replacement surgery: Secondary | ICD-10-CM | POA: Diagnosis not present

## 2023-07-02 DIAGNOSIS — R2689 Other abnormalities of gait and mobility: Secondary | ICD-10-CM | POA: Diagnosis not present

## 2023-07-02 DIAGNOSIS — R278 Other lack of coordination: Secondary | ICD-10-CM | POA: Diagnosis not present

## 2023-07-05 DIAGNOSIS — R2681 Unsteadiness on feet: Secondary | ICD-10-CM | POA: Diagnosis not present

## 2023-07-05 DIAGNOSIS — Z471 Aftercare following joint replacement surgery: Secondary | ICD-10-CM | POA: Diagnosis not present

## 2023-07-05 DIAGNOSIS — Z96651 Presence of right artificial knee joint: Secondary | ICD-10-CM | POA: Diagnosis not present

## 2023-07-05 DIAGNOSIS — R2689 Other abnormalities of gait and mobility: Secondary | ICD-10-CM | POA: Diagnosis not present

## 2023-07-05 DIAGNOSIS — R278 Other lack of coordination: Secondary | ICD-10-CM | POA: Diagnosis not present

## 2023-07-05 DIAGNOSIS — M6281 Muscle weakness (generalized): Secondary | ICD-10-CM | POA: Diagnosis not present

## 2023-07-08 DIAGNOSIS — M6281 Muscle weakness (generalized): Secondary | ICD-10-CM | POA: Diagnosis not present

## 2023-07-08 DIAGNOSIS — R2681 Unsteadiness on feet: Secondary | ICD-10-CM | POA: Diagnosis not present

## 2023-07-08 DIAGNOSIS — R278 Other lack of coordination: Secondary | ICD-10-CM | POA: Diagnosis not present

## 2023-07-08 DIAGNOSIS — Z471 Aftercare following joint replacement surgery: Secondary | ICD-10-CM | POA: Diagnosis not present

## 2023-07-08 DIAGNOSIS — R2689 Other abnormalities of gait and mobility: Secondary | ICD-10-CM | POA: Diagnosis not present

## 2023-07-08 DIAGNOSIS — Z96651 Presence of right artificial knee joint: Secondary | ICD-10-CM | POA: Diagnosis not present

## 2023-07-09 DIAGNOSIS — Z471 Aftercare following joint replacement surgery: Secondary | ICD-10-CM | POA: Diagnosis not present

## 2023-07-09 DIAGNOSIS — R2689 Other abnormalities of gait and mobility: Secondary | ICD-10-CM | POA: Diagnosis not present

## 2023-07-09 DIAGNOSIS — R278 Other lack of coordination: Secondary | ICD-10-CM | POA: Diagnosis not present

## 2023-07-09 DIAGNOSIS — M6281 Muscle weakness (generalized): Secondary | ICD-10-CM | POA: Diagnosis not present

## 2023-07-09 DIAGNOSIS — R2681 Unsteadiness on feet: Secondary | ICD-10-CM | POA: Diagnosis not present

## 2023-07-09 DIAGNOSIS — Z96651 Presence of right artificial knee joint: Secondary | ICD-10-CM | POA: Diagnosis not present

## 2023-07-13 DIAGNOSIS — R278 Other lack of coordination: Secondary | ICD-10-CM | POA: Diagnosis not present

## 2023-07-13 DIAGNOSIS — M6281 Muscle weakness (generalized): Secondary | ICD-10-CM | POA: Diagnosis not present

## 2023-07-13 DIAGNOSIS — R2681 Unsteadiness on feet: Secondary | ICD-10-CM | POA: Diagnosis not present

## 2023-07-13 DIAGNOSIS — Z96651 Presence of right artificial knee joint: Secondary | ICD-10-CM | POA: Diagnosis not present

## 2023-07-13 DIAGNOSIS — R2689 Other abnormalities of gait and mobility: Secondary | ICD-10-CM | POA: Diagnosis not present

## 2023-07-13 DIAGNOSIS — Z471 Aftercare following joint replacement surgery: Secondary | ICD-10-CM | POA: Diagnosis not present

## 2023-07-15 ENCOUNTER — Other Ambulatory Visit: Payer: Self-pay | Admitting: Family Medicine

## 2023-07-15 DIAGNOSIS — Z471 Aftercare following joint replacement surgery: Secondary | ICD-10-CM | POA: Diagnosis not present

## 2023-07-15 DIAGNOSIS — Z96651 Presence of right artificial knee joint: Secondary | ICD-10-CM | POA: Diagnosis not present

## 2023-07-15 DIAGNOSIS — R2681 Unsteadiness on feet: Secondary | ICD-10-CM | POA: Diagnosis not present

## 2023-07-15 DIAGNOSIS — R2689 Other abnormalities of gait and mobility: Secondary | ICD-10-CM | POA: Diagnosis not present

## 2023-07-15 DIAGNOSIS — R278 Other lack of coordination: Secondary | ICD-10-CM | POA: Diagnosis not present

## 2023-07-15 DIAGNOSIS — M6281 Muscle weakness (generalized): Secondary | ICD-10-CM | POA: Diagnosis not present

## 2023-07-15 DIAGNOSIS — N529 Male erectile dysfunction, unspecified: Secondary | ICD-10-CM

## 2023-07-19 ENCOUNTER — Telehealth: Payer: Self-pay | Admitting: Family Medicine

## 2023-07-19 DIAGNOSIS — R278 Other lack of coordination: Secondary | ICD-10-CM | POA: Diagnosis not present

## 2023-07-19 DIAGNOSIS — R2689 Other abnormalities of gait and mobility: Secondary | ICD-10-CM | POA: Diagnosis not present

## 2023-07-19 DIAGNOSIS — Z96651 Presence of right artificial knee joint: Secondary | ICD-10-CM | POA: Diagnosis not present

## 2023-07-19 DIAGNOSIS — Z471 Aftercare following joint replacement surgery: Secondary | ICD-10-CM | POA: Diagnosis not present

## 2023-07-19 DIAGNOSIS — M6281 Muscle weakness (generalized): Secondary | ICD-10-CM | POA: Diagnosis not present

## 2023-07-19 DIAGNOSIS — R2681 Unsteadiness on feet: Secondary | ICD-10-CM | POA: Diagnosis not present

## 2023-07-19 NOTE — Telephone Encounter (Signed)
 APPT MADE

## 2023-07-19 NOTE — Telephone Encounter (Signed)
Copied from CRM [id number]. Topic: General - Other >> Jul 19, 2023  2:08 PM Everette C wrote: Reason for CRM: The patient has called to request imaging orders for their left hip  The patient would like to be contacted further when orders have been created  Please contact when possible

## 2023-07-19 NOTE — Telephone Encounter (Signed)
 Pt needs an appt

## 2023-07-20 DIAGNOSIS — I152 Hypertension secondary to endocrine disorders: Secondary | ICD-10-CM | POA: Diagnosis not present

## 2023-07-20 DIAGNOSIS — Z794 Long term (current) use of insulin: Secondary | ICD-10-CM | POA: Diagnosis not present

## 2023-07-20 DIAGNOSIS — E785 Hyperlipidemia, unspecified: Secondary | ICD-10-CM | POA: Diagnosis not present

## 2023-07-20 DIAGNOSIS — R0602 Shortness of breath: Secondary | ICD-10-CM | POA: Diagnosis not present

## 2023-07-20 DIAGNOSIS — E1159 Type 2 diabetes mellitus with other circulatory complications: Secondary | ICD-10-CM | POA: Diagnosis not present

## 2023-07-20 DIAGNOSIS — E1169 Type 2 diabetes mellitus with other specified complication: Secondary | ICD-10-CM | POA: Diagnosis not present

## 2023-07-20 DIAGNOSIS — E1142 Type 2 diabetes mellitus with diabetic polyneuropathy: Secondary | ICD-10-CM | POA: Diagnosis not present

## 2023-07-21 ENCOUNTER — Ambulatory Visit: Payer: Medicare Other | Admitting: Family Medicine

## 2023-07-21 ENCOUNTER — Encounter: Payer: Self-pay | Admitting: Family Medicine

## 2023-07-21 ENCOUNTER — Ambulatory Visit
Admission: RE | Admit: 2023-07-21 | Discharge: 2023-07-21 | Disposition: A | Discharge status: Home / Self Care | Payer: Medicare Other | Source: Ambulatory Visit | Attending: Family Medicine | Admitting: Family Medicine

## 2023-07-21 ENCOUNTER — Ambulatory Visit
Admission: RE | Admit: 2023-07-21 | Discharge: 2023-07-21 | Disposition: A | Discharge status: Home / Self Care | Payer: Medicare Other | Attending: Family Medicine | Admitting: Family Medicine

## 2023-07-21 VITALS — BP 112/70 | HR 99 | Resp 16 | Ht 75.5 in | Wt 202.3 lb

## 2023-07-21 DIAGNOSIS — Z96651 Presence of right artificial knee joint: Secondary | ICD-10-CM

## 2023-07-21 DIAGNOSIS — M25552 Pain in left hip: Secondary | ICD-10-CM | POA: Insufficient documentation

## 2023-07-21 DIAGNOSIS — M85852 Other specified disorders of bone density and structure, left thigh: Secondary | ICD-10-CM | POA: Diagnosis not present

## 2023-07-21 DIAGNOSIS — M1612 Unilateral primary osteoarthritis, left hip: Secondary | ICD-10-CM | POA: Diagnosis not present

## 2023-07-21 DIAGNOSIS — R2681 Unsteadiness on feet: Secondary | ICD-10-CM | POA: Diagnosis not present

## 2023-07-21 DIAGNOSIS — M6281 Muscle weakness (generalized): Secondary | ICD-10-CM | POA: Diagnosis not present

## 2023-07-21 DIAGNOSIS — Z471 Aftercare following joint replacement surgery: Secondary | ICD-10-CM | POA: Diagnosis not present

## 2023-07-21 DIAGNOSIS — R278 Other lack of coordination: Secondary | ICD-10-CM | POA: Diagnosis not present

## 2023-07-21 DIAGNOSIS — R2689 Other abnormalities of gait and mobility: Secondary | ICD-10-CM | POA: Diagnosis not present

## 2023-07-21 NOTE — Progress Notes (Signed)
 Name: Jeremy [name]   MRN: [id number]    DOB: 1936/02/03   Date:07/21/2023       Progress Note  Subjective  Chief Complaint  Chief Complaint  Patient presents with   Hip Pain    Left hip on/off for a while pt requesting imaging orders   Discussed the use of AI scribe software for clinical note transcription with the patient, who gave verbal consent to proceed.  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  He experiences left hip pain described as a dull ache occurring with weight-bearing activities such as walking and getting in and out of a car, but not when sitting or lying down. The pain is localized to the outer part of the hip without associated groin pain. The pain has been recurring, with episodes before his right knee surgery in September 2024, and again in November or December 2024, each lasting about a week before subsiding.  For pain management, he takes acetaminophen  due to being on Eliquis for atrial fibrillation, which limits the use of other pain medications. He has not tried topical treatments like Voltaren  gel or Lidoderm  patches. He is concerned about using steroid injections due to a previous experience where it affected his diabetes management.  He underwent right knee surgery on March 03, 2023, and spent five weeks in rehabilitation at [facility] [facility]. He has been attending physical therapy at [facility] and continues to walk regularly, despite the hip pain making it difficult.  He is scheduled for a cardiac ablation later this month and is concerned that any intervention for his hip pain might affect his blood sugar levels, potentially impacting the procedure.  No pain when sitting or lying down, and no rashes. No knee or quadriceps pain, with the pain localized to the hip area.        Patient Active Problem List   Diagnosis Date Noted    Anticoagulant long-term use 06/03/2023   Family history of Lynch syndrome 06/03/2023   Insomnia 03/11/2023   Anemia 03/11/2023   Hypertension, benign 03/11/2023   Type 2 diabetes mellitus with microalbuminuria, with long-term current use of insulin (HCC) 03/11/2023   History of total knee replacement 03/11/2023   Persistent atrial fibrillation (HCC) 02/16/2023   Thrombocytopenia (HCC) 06/03/2022   B12 deficiency 06/03/2022   Basal cell carcinoma (BCC) of skin of face 11/03/2021   Dyslipidemia associated with type 2 diabetes mellitus (HCC) 11/03/2021   Impingement syndrome of shoulder region 11/16/2018   Atherosclerosis of aorta (HCC) 07/21/2018   History of diverticulitis of colon 11/15/2017   Hyperlipidemia due to type 2 diabetes mellitus (HCC) 03/14/2017   Bilateral lower extremity edema 03/27/2016   Vitamin D  deficiency 12/05/2015   Hx of transient ischemic attack (TIA) 09/17/2015   Diabetes mellitus type 2 without retinopathy (HCC) 05/31/2015   Hyperlipidemia 05/30/2015   Type 2 diabetes mellitus with renal manifestations (HCC) 01/08/2015   Incisional hernia, without obstruction or gangrene 03/23/2013   Enlarged prostate with lower urinary tract symptoms (LUTS) 11/03/2012   Myopic degeneration, bilateral 08/12/2012   Nonexudative age-related macular degeneration 07/15/2012   Anisometropia 04/08/2012   Pseudophakia 04/08/2012   History of retinal detachment 04/08/2012   Presence of intraocular lens 04/08/2012   Secondary open-angle glaucoma of left eye, severe stage 01/13/2012   Benign essential HTN 02/03/2007   Hypertension associated with diabetes (HCC) 02/03/2007  Past Surgical History:  Procedure Laterality Date   APPENDECTOMY     BLADDER DIVERTICULECTOMY  2013   CARDIOVERSION N/A 02/24/2023   Procedure: CARDIOVERSION;  Surgeon: Ammon Blunt, MD;  Location: [facility] ORS;  Service: Cardiovascular;  Laterality: N/A;   CARDIOVERSION N/A 04/21/2023   Procedure:  CARDIOVERSION;  Surgeon: Ammon Blunt, MD;  Location: [facility] ORS;  Service: Cardiovascular;  Laterality: N/A;   CARPAL TUNNEL RELEASE  01/2022   revision right side   COLON SURGERY Left 1980's   COLONOSCOPY W/ POLYPECTOMY  1990's   Dr Rollene   COLONOSCOPY WITH PROPOFOL  N/A 12/21/2014   Procedure: COLONOSCOPY WITH PROPOFOL ;  Surgeon: Gladis RAYMOND Mariner, MD;  Location: [facility] ENDOSCOPY;  Service: Endoscopy;  Laterality: N/A;   COLONOSCOPY WITH PROPOFOL  N/A 08/16/2017   Procedure: COLONOSCOPY WITH PROPOFOL ;  Surgeon: Mariner Gladis RAYMOND, MD;  Location: [facility] ENDOSCOPY;  Service: Endoscopy;  Laterality: N/A;   COLONOSCOPY WITH PROPOFOL  N/A 01/31/2021   Procedure: COLONOSCOPY WITH PROPOFOL ;  Surgeon: Dessa Reyes ORN, MD;  Location: [facility] ENDOSCOPY;  Service: Endoscopy;  Laterality: N/A;   ESOPHAGOGASTRODUODENOSCOPY (EGD) WITH PROPOFOL  N/A 02/15/2015   Procedure: ESOPHAGOGASTRODUODENOSCOPY (EGD) WITH PROPOFOL ;  Surgeon: Gladis RAYMOND Mariner, MD;  Location: [facility] ENDOSCOPY;  Service: Endoscopy;  Laterality: N/A;   ESOPHAGOGASTRODUODENOSCOPY (EGD) WITH PROPOFOL  N/A 06/10/2017   Procedure: ESOPHAGOGASTRODUODENOSCOPY (EGD) WITH PROPOFOL ;  Surgeon: Mariner Gladis RAYMOND, MD;  Location: [facility] ENDOSCOPY;  Service: Endoscopy;  Laterality: N/A;   ESOPHAGOGASTRODUODENOSCOPY (EGD) WITH PROPOFOL  N/A 08/16/2017   Procedure: ESOPHAGOGASTRODUODENOSCOPY (EGD) WITH PROPOFOL ;  Surgeon: Mariner Gladis RAYMOND, MD;  Location: [facility] ENDOSCOPY;  Service: Endoscopy;  Laterality: N/A;   ESOPHAGOGASTRODUODENOSCOPY (EGD) WITH PROPOFOL  N/A 01/31/2021   Procedure: ESOPHAGOGASTRODUODENOSCOPY (EGD) WITH PROPOFOL ;  Surgeon: Dessa Reyes ORN, MD;  Location: [facility] ENDOSCOPY;  Service: Endoscopy;  Laterality: N/A;   EYE SURGERY     GLAUCOMA SURGERY Left 1990's   [facility]   HEMORROIDECTOMY  [phone]   [facility]   HERNIA REPAIR     [facility], Dr Rollene FONTANA HERNIA REPAIR Bilateral [phone]   LARYNX SURGERY  1990   3 times    MOHS SURGERY  01/22/2022   Dr. Timm - [facility]   REPLACEMENT TOTAL KNEE Right    RETINAL DETACHMENT SURGERY Right    TONSILLECTOMY     turp  2013   URETER SURGERY  2013   Dr Ike    Family History  Problem Relation Age of Onset   Diabetes Mother    Cataracts Mother    Metabolic syndrome Mother    Blindness Mother        r/t diabetes   Cataracts Father    Glaucoma Father        Retina-Detachment   Atrial fibrillation Father    Diverticulitis Sister    Glaucoma Brother    Diabetes Brother    Cancer Sister        Gallbladder   Cancer Daughter     Social History   Tobacco Use   Smoking status: Never   Smokeless tobacco: Never   Tobacco comments:    smoking cessation materials not required  Substance Use Topics   Alcohol use: No     Current Outpatient Medications:    acetaminophen  (TYLENOL ) 500 MG tablet, Take 1,000 mg by mouth 2 (two) times daily as needed for moderate pain (pain score 4-6)., Disp: , Rfl:    apixaban (ELIQUIS) 5 MG TABS tablet, Take 5 mg by mouth 2 (two) times daily., Disp: , Rfl:  brimonidine-timolol (COMBIGAN) 0.2-0.5 % ophthalmic solution, Place 1 drop into the left eye 2 (two) times daily., Disp: , Rfl:    desonide  (DESOWEN ) 0.05 % ointment, Apply 1 Application topically daily as needed (irritation)., Disp: , Rfl:    diltiazem (CARDIZEM CD) 120 MG 24 hr capsule, Take 120 mg by mouth daily., Disp: , Rfl:    dorzolamide (TRUSOPT) 2 % ophthalmic solution, Place 1 drop into the left eye 2 (two) times daily., Disp: , Rfl: 3   empagliflozin (JARDIANCE) 25 MG TABS tablet, Take 25 mg by mouth daily., Disp: , Rfl:    Exenatide  ER (BYDUREON  BCISE) 2 MG/0.85ML AUIJ, Inject 2 mg into the skin every Sunday., Disp: , Rfl:    finasteride  (PROSCAR ) 5 MG tablet, Take 1 tablet by mouth once daily, Disp: 90 tablet, Rfl: 1   flecainide (TAMBOCOR) 50 MG tablet, Take 100 mg by mouth 2 (two) times daily., Disp: , Rfl:    glipiZIDE (GLUCOTROL) 10 MG tablet, Take  10 mg by mouth daily., Disp: , Rfl:    hydrocortisone  2.5 % cream, APPLY TO THE AFFECTED AREA OF FACE AND EARS EVERY OTHER NIGHT ALTERNATING WITH KETOCONAZOLE  CREAM (Patient taking differently: Apply 1 Application topically daily as needed (itching around the face).), Disp: 28 g, Rfl: 0   insulin degludec (TRESIBA FLEXTOUCH) 200 UNIT/ML FlexTouch Pen, Inject 16 Units into the skin in the morning., Disp: , Rfl:    ketoconazole  (NIZORAL ) 2 % cream, Apply 1 Application topically daily., Disp: , Rfl:    ketoconazole  (NIZORAL ) 2 % shampoo, Apply 1 Application topically once a week., Disp: , Rfl:    loperamide (IMODIUM A-D) 2 MG tablet, Take 2 mg by mouth as needed for diarrhea or loose stools., Disp: , Rfl:    loratadine (CLARITIN) 10 MG tablet, Take 10 mg by mouth daily., Disp: , Rfl:    metFORMIN  (GLUCOPHAGE ) 1000 MG tablet, Take 1,000 mg by mouth 2 (two) times daily with a meal. On Monday and Wednesday, Disp: , Rfl:    MOMETASONE  FUROATE EX, Apply 1 Application topically daily as needed (itchy scalp)., Disp: , Rfl:    Multiple Vitamin (MULTIVITAMIN) tablet, Take 1 tablet by mouth daily., Disp: , Rfl:    omega-3 acid ethyl esters (LOVAZA ) 1 g capsule, Take 2 capsules (2 g total) by mouth 2 (two) times daily., Disp: 480 capsule, Rfl: 3   omeprazole  (PRILOSEC) 20 MG capsule, Take 1 capsule (20 mg total) by mouth daily., Disp: 90 capsule, Rfl: 1   rosuvastatin  (CRESTOR ) 10 MG tablet, Take 1 tablet (10 mg total) by mouth at bedtime., Disp: 90 tablet, Rfl: 1   tadalafil  (CIALIS ) 20 MG tablet, TAKE 1/2 TO 1 (ONE-HALF TO ONE) TABLET BY MOUTH EVERY OTHER DAY AS NEEDED FOR ERECTILE DYSFUNCTION, Disp: 30 tablet, Rfl: 0   telmisartan -hydrochlorothiazide  (MICARDIS  HCT) 80-12.5 MG tablet, Take 1 tablet by mouth daily., Disp: 90 tablet, Rfl: 1  Allergies  Allergen Reactions   Demerol [Meperidine] Other (See Comments) and Anaphylaxis    MAKES BLOOD PRESSURE BOTTOM OUT hypotension   Metoprolol Shortness Of Breath    Dilaudid [Hydromorphone Hcl]     MAKES BLOOD PRESSURE BOTTOM OUT   Actos [Pioglitazone] Other (See Comments) and Hives   Dapagliflozin Rash   Penicillins Rash    I personally reviewed active problem list with the patient/caregiver today.   ROS  Ten systems reviewed and is negative except as mentioned in HPI    Objective  Vitals:   07/21/23 1449  BP: 112/70  Pulse: 99  Resp: 16  SpO2: 95%  Weight: 202 lb 4.8 oz (91.8 kg)  Height: 6' 3.5 (1.918 m)    Body mass index is 24.95 kg/m.  Physical Exam  CARDIOVASCULAR: Heart and lungs auscultated, no abnormalities noted. MUSCULOSKELETAL: Limping observed. Pain localized to the outer part of the left hip, not elicited by palpation. Movement causes discomfort, particularly with internal rotation oof left hip  Recent Results (from the past 2160 hours)  Lipid panel     Status: Abnormal   Collection Time: 06/21/23  2:27 PM  Result Value Ref Range   Cholesterol 89 <200 mg/dL   HDL 33 (L) > OR = 40 mg/dL   Triglycerides 882 <849 mg/dL   LDL Cholesterol (Calc) 36 mg/dL (calc)    Comment: Reference range: <100 . Desirable range <100 mg/dL for primary prevention;   <70 mg/dL for patients with CHD or diabetic patients  with > or = 2 CHD risk factors. SABRA LDL-C is now calculated using the Martin-Hopkins  calculation, which is a validated novel method providing  better accuracy than the Friedewald equation in the  estimation of LDL-C.  Gladis APPLETHWAITE et al. SANDREA. 7986;689(80): 2061-2068  (http://education.[employer].com/faq/FAQ164)    Total CHOL/HDL Ratio 2.7 <5.0 (calc)   Non-HDL Cholesterol (Calc) 56 <869 mg/dL (calc)    Comment: For patients with diabetes plus 1 major ASCVD risk  factor, treating to a non-HDL-C goal of <100 mg/dL  (LDL-C of <29 mg/dL) is considered a therapeutic  option.   Microalbumin / creatinine urine ratio     Status: Abnormal   Collection Time: 06/21/23  2:27 PM  Result Value Ref Range    Creatinine, Urine 44 20 - 320 mg/dL   Microalb, Ur 9.3 mg/dL    Comment: Reference Range Not established    Microalb Creat Ratio 211 (H) <30 mg/g creat    Comment: . The ADA defines abnormalities in albumin excretion as follows: SABRA Albuminuria Category        Result (mg/g creatinine) . Normal to Mildly increased   <30 Moderately increased         30-299  Severely increased           > OR = 300 . The ADA recommends that at least two of three specimens collected within a 3-6 month period be abnormal before considering a patient to be within a diagnostic category.   COMPLETE METABOLIC PANEL WITH GFR     Status: Abnormal   Collection Time: 06/21/23  2:27 PM  Result Value Ref Range   Glucose, Bld 119 (H) 65 - 99 mg/dL    Comment: .            Fasting reference interval . For someone without known diabetes, a glucose value between 100 and 125 mg/dL is consistent with prediabetes and should be confirmed with a follow-up test. .    BUN 22 7 - 25 mg/dL   Creat 9.13 9.29 - 8.77 mg/dL   eGFR 85 > OR = 60 fO/fpw/8.26f7   BUN/Creatinine Ratio SEE NOTE: 6 - 22 (calc)    Comment:    Not Reported: BUN and Creatinine are within    reference range. .    Sodium 141 135 - 146 mmol/L   Potassium 4.2 3.5 - 5.3 mmol/L   Chloride 103 98 - 110 mmol/L   CO2 28 20 - 32 mmol/L   Calcium  9.0 8.6 - 10.3 mg/dL   Total Protein 6.9 6.1 - 8.1 g/dL  Albumin 4.5 3.6 - 5.1 g/dL   Globulin 2.4 1.9 - 3.7 g/dL (calc)   AG Ratio 1.9 1.0 - 2.5 (calc)   Total Bilirubin 0.6 0.2 - 1.2 mg/dL   Alkaline phosphatase (APISO) 50 35 - 144 U/L   AST 14 10 - 35 U/L   ALT 8 (L) 9 - 46 U/L  CBC with Differential/Platelet     Status: Abnormal   Collection Time: 06/21/23  2:27 PM  Result Value Ref Range   WBC 3.6 (L) 3.8 - 10.8 Thousand/uL   RBC 4.09 (L) 4.20 - 5.80 Million/uL   Hemoglobin 13.5 13.2 - 17.1 g/dL   HCT 59.3 61.4 - 49.9 %   MCV 99.3 80.0 - 100.0 fL   MCH 33.0 27.0 - 33.0 pg   MCHC 33.3 32.0 -  36.0 g/dL    Comment: For adults, a slight decrease in the calculated MCHC value (in the range of 30 to 32 g/dL) is most likely not clinically significant; however, it should be interpreted with caution in correlation with other red cell parameters and the patient's clinical condition.    RDW 12.9 11.0 - 15.0 %   Platelets 101 (L) 140 - 400 Thousand/uL   MPV 11.6 7.5 - 12.5 fL   Neutro Abs 1,919 1,500 - 7,800 cells/uL   Absolute Lymphocytes 994 850 - 3,900 cells/uL   Absolute Monocytes 666 200 - 950 cells/uL   Eosinophils Absolute 11 (L) 15 - 500 cells/uL   Basophils Absolute 11 0 - 200 cells/uL   Neutrophils Relative % 53.3 %   Total Lymphocyte 27.6 %   Monocytes Relative 18.5 %   Eosinophils Relative 0.3 %   Basophils Relative 0.3 %   Smear Review      Comment: No platelet clumps seen. Giant platelets noted. Review of peripheral smear confirms automated results.     Diabetic Foot Exam:     PHQ2/9:    06/21/2023    2:05 PM 05/10/2023    1:11 PM 05/06/2023    2:32 PM 01/25/2023   11:43 AM 01/06/2023    2:23 PM  Depression screen PHQ 2/9  Decreased Interest 0 0 0 0 0  Down, Depressed, Hopeless 0 0 0 0 0  PHQ - 2 Score 0 0 0 0 0  Altered sleeping 0 0 0 0 0  Tired, decreased energy 0 0 0 0 0  Change in appetite 0 0 0 0 0  Feeling bad or failure about yourself  0 0 0 0 0  Trouble concentrating 0 0 0 0 0  Moving slowly or fidgety/restless 0 0 0 0 0  Suicidal thoughts 0 0 0 0 0  PHQ-9 Score 0 0 0 0 0  Difficult doing work/chores Not difficult at all  Not difficult at all Not difficult at all     phq 9 is negative  Fall Risk:    06/21/2023    1:57 PM 05/10/2023    1:11 PM 05/06/2023    2:38 PM 01/25/2023   11:43 AM 01/06/2023    2:23 PM  Fall Risk   Falls in the past year? 0 0 0 0 0  Number falls in past yr: 0 0 0 0   Injury with Fall? 0 0 0 0   Risk for fall due to : No Fall Risks Orthopedic patient No Fall Risks No Fall Risks Impaired balance/gait  Follow up  Falls prevention discussed;Education provided;Falls evaluation completed Falls prevention discussed;Education provided;Falls evaluation completed Falls prevention discussed;Falls evaluation  completed Falls prevention discussed;Education provided;Falls evaluation completed Falls prevention discussed;Education provided     Assessment and Plan    Left Hip Pain Pain localized to the outer part of the hip, exacerbated by weight bearing. No pain at rest or with passive movement. Possible trochanteric bursitis given the location and nature of the pain. -Order hip X-ray to further evaluate the cause of the pain. -Prescribe Voltaren  gel for local application every 4 hours to decrease inflammation. consider lidoderm  patch  Right Knee Post-Operative Recovery Patient underwent right knee surgery on March 03, 2023 and is currently in physical therapy. No reported complications or pain. -Continue current physical therapy regimen.  Atrial Fibrillation Patient is on Eliquis for anticoagulation. No reported complications. -Continue Eliquis as prescribed.  Upcoming Cardiac Ablation Patient has a cardiac ablation scheduled this month. No specific plan discussed regarding this procedure. -Continue current management and follow up as needed.

## 2023-07-22 ENCOUNTER — Telehealth: Payer: Self-pay | Admitting: Family Medicine

## 2023-07-22 NOTE — Telephone Encounter (Signed)
 Pt is calling to report that Dr. Ava Lei gave him a Good Rx card for Volarten however the script was not called in. [facility] [address] [place], [place] - [address] Phone: [phone]  Fax: [phone]

## 2023-07-23 ENCOUNTER — Telehealth: Payer: Self-pay | Admitting: Family Medicine

## 2023-07-23 ENCOUNTER — Other Ambulatory Visit: Payer: Self-pay | Admitting: Family Medicine

## 2023-07-23 DIAGNOSIS — M6281 Muscle weakness (generalized): Secondary | ICD-10-CM | POA: Diagnosis not present

## 2023-07-23 DIAGNOSIS — Z96651 Presence of right artificial knee joint: Secondary | ICD-10-CM | POA: Diagnosis not present

## 2023-07-23 DIAGNOSIS — Z471 Aftercare following joint replacement surgery: Secondary | ICD-10-CM | POA: Diagnosis not present

## 2023-07-23 DIAGNOSIS — R2689 Other abnormalities of gait and mobility: Secondary | ICD-10-CM | POA: Diagnosis not present

## 2023-07-23 DIAGNOSIS — R278 Other lack of coordination: Secondary | ICD-10-CM | POA: Diagnosis not present

## 2023-07-23 DIAGNOSIS — R2681 Unsteadiness on feet: Secondary | ICD-10-CM | POA: Diagnosis not present

## 2023-07-23 MED ORDER — DICLOFENAC SODIUM 1 % EX GEL: 4.0000 g | Freq: Four times a day (QID) | CUTANEOUS | 1 refills | Status: AC

## 2023-07-23 NOTE — Telephone Encounter (Signed)
 Copied from CRM [id number]. Topic: Clinical - Lab/Test Results >> Jul 23, 2023 12:28 PM Ethelle Herb L wrote: Reason for CRM: Pt calling for xray results of left hip. Pt is requesting call once results are available on mobile phone number

## 2023-07-23 NOTE — Telephone Encounter (Signed)
 No radiology report yet, will call once PCP reviews them

## 2023-07-26 ENCOUNTER — Telehealth: Payer: Self-pay | Admitting: Family Medicine

## 2023-07-26 DIAGNOSIS — Z471 Aftercare following joint replacement surgery: Secondary | ICD-10-CM | POA: Diagnosis not present

## 2023-07-26 DIAGNOSIS — R2681 Unsteadiness on feet: Secondary | ICD-10-CM | POA: Diagnosis not present

## 2023-07-26 DIAGNOSIS — R2689 Other abnormalities of gait and mobility: Secondary | ICD-10-CM | POA: Diagnosis not present

## 2023-07-26 DIAGNOSIS — R278 Other lack of coordination: Secondary | ICD-10-CM | POA: Diagnosis not present

## 2023-07-26 DIAGNOSIS — M6281 Muscle weakness (generalized): Secondary | ICD-10-CM | POA: Diagnosis not present

## 2023-07-26 DIAGNOSIS — Z96651 Presence of right artificial knee joint: Secondary | ICD-10-CM | POA: Diagnosis not present

## 2023-07-26 NOTE — Telephone Encounter (Signed)
 notified patient has not been reviewed by radiology team and they are behind on reading reports. Pt verbalized understanding

## 2023-07-26 NOTE — Telephone Encounter (Signed)
 Copied from CRM [id number]. Topic: General - Other >> Jul 26, 2023  3:36 PM Ja-Kwan M wrote: Reason for CRM: Pt called once again requesting x-ray results of left hip. Pt requests call back asap to go over the results.

## 2023-07-30 DIAGNOSIS — R2689 Other abnormalities of gait and mobility: Secondary | ICD-10-CM | POA: Diagnosis not present

## 2023-07-30 DIAGNOSIS — R278 Other lack of coordination: Secondary | ICD-10-CM | POA: Diagnosis not present

## 2023-07-30 DIAGNOSIS — R2681 Unsteadiness on feet: Secondary | ICD-10-CM | POA: Diagnosis not present

## 2023-07-30 DIAGNOSIS — M6281 Muscle weakness (generalized): Secondary | ICD-10-CM | POA: Diagnosis not present

## 2023-07-30 DIAGNOSIS — Z96651 Presence of right artificial knee joint: Secondary | ICD-10-CM | POA: Diagnosis not present

## 2023-07-30 DIAGNOSIS — Z471 Aftercare following joint replacement surgery: Secondary | ICD-10-CM | POA: Diagnosis not present

## 2023-08-02 ENCOUNTER — Telehealth: Payer: Self-pay

## 2023-08-02 DIAGNOSIS — Z7901 Long term (current) use of anticoagulants: Secondary | ICD-10-CM | POA: Diagnosis not present

## 2023-08-02 DIAGNOSIS — E1159 Type 2 diabetes mellitus with other circulatory complications: Secondary | ICD-10-CM | POA: Diagnosis not present

## 2023-08-02 DIAGNOSIS — I152 Hypertension secondary to endocrine disorders: Secondary | ICD-10-CM | POA: Diagnosis not present

## 2023-08-02 DIAGNOSIS — I444 Left anterior fascicular block: Secondary | ICD-10-CM | POA: Diagnosis not present

## 2023-08-02 DIAGNOSIS — D696 Thrombocytopenia, unspecified: Secondary | ICD-10-CM | POA: Diagnosis not present

## 2023-08-02 DIAGNOSIS — I340 Nonrheumatic mitral (valve) insufficiency: Secondary | ICD-10-CM | POA: Diagnosis not present

## 2023-08-02 DIAGNOSIS — Z01810 Encounter for preprocedural cardiovascular examination: Secondary | ICD-10-CM | POA: Diagnosis not present

## 2023-08-02 DIAGNOSIS — I4892 Unspecified atrial flutter: Secondary | ICD-10-CM | POA: Diagnosis not present

## 2023-08-02 DIAGNOSIS — I441 Atrioventricular block, second degree: Secondary | ICD-10-CM | POA: Diagnosis not present

## 2023-08-02 DIAGNOSIS — I4819 Other persistent atrial fibrillation: Secondary | ICD-10-CM | POA: Diagnosis not present

## 2023-08-02 DIAGNOSIS — Z8673 Personal history of transient ischemic attack (TIA), and cerebral infarction without residual deficits: Secondary | ICD-10-CM | POA: Diagnosis not present

## 2023-08-02 DIAGNOSIS — I352 Nonrheumatic aortic (valve) stenosis with insufficiency: Secondary | ICD-10-CM | POA: Diagnosis not present

## 2023-08-02 DIAGNOSIS — Z8679 Personal history of other diseases of the circulatory system: Secondary | ICD-10-CM | POA: Diagnosis not present

## 2023-08-02 NOTE — Telephone Encounter (Signed)
Duplicate encounter, documented on the other one. Pt aware

## 2023-08-02 NOTE — Telephone Encounter (Signed)
Pt has been calling a few times about Xray results, last note was on Feb 10 and he still hasn't heard anything. Please cb today

## 2023-08-02 NOTE — Telephone Encounter (Signed)
Called pt and informed him [facility] stated they are behind average 2-3 weeks on reading reports. I notified patient and verbalized understanding. I advised pt we will call him as soon as PCP reviews it.

## 2023-08-02 NOTE — Telephone Encounter (Signed)
Pt called again today for hip xray results- no result notes to give pt. According to chart second time he has called for reults. Please reach out to pt ASAP.  See 07/26/23  TE.

## 2023-08-03 DIAGNOSIS — D696 Thrombocytopenia, unspecified: Secondary | ICD-10-CM | POA: Diagnosis not present

## 2023-08-03 DIAGNOSIS — E1142 Type 2 diabetes mellitus with diabetic polyneuropathy: Secondary | ICD-10-CM | POA: Diagnosis not present

## 2023-08-03 DIAGNOSIS — N181 Chronic kidney disease, stage 1: Secondary | ICD-10-CM | POA: Diagnosis not present

## 2023-08-03 DIAGNOSIS — Z7901 Long term (current) use of anticoagulants: Secondary | ICD-10-CM | POA: Diagnosis not present

## 2023-08-03 DIAGNOSIS — I493 Ventricular premature depolarization: Secondary | ICD-10-CM | POA: Diagnosis not present

## 2023-08-03 DIAGNOSIS — Z880 Allergy status to penicillin: Secondary | ICD-10-CM | POA: Diagnosis not present

## 2023-08-03 DIAGNOSIS — I272 Pulmonary hypertension, unspecified: Secondary | ICD-10-CM | POA: Diagnosis not present

## 2023-08-03 DIAGNOSIS — E1159 Type 2 diabetes mellitus with other circulatory complications: Secondary | ICD-10-CM | POA: Diagnosis not present

## 2023-08-03 DIAGNOSIS — I483 Typical atrial flutter: Secondary | ICD-10-CM | POA: Diagnosis not present

## 2023-08-03 DIAGNOSIS — E1122 Type 2 diabetes mellitus with diabetic chronic kidney disease: Secondary | ICD-10-CM | POA: Diagnosis not present

## 2023-08-03 DIAGNOSIS — I129 Hypertensive chronic kidney disease with stage 1 through stage 4 chronic kidney disease, or unspecified chronic kidney disease: Secondary | ICD-10-CM | POA: Diagnosis not present

## 2023-08-03 DIAGNOSIS — Z8673 Personal history of transient ischemic attack (TIA), and cerebral infarction without residual deficits: Secondary | ICD-10-CM | POA: Diagnosis not present

## 2023-08-03 DIAGNOSIS — Z7984 Long term (current) use of oral hypoglycemic drugs: Secondary | ICD-10-CM | POA: Diagnosis not present

## 2023-08-03 DIAGNOSIS — Z885 Allergy status to narcotic agent status: Secondary | ICD-10-CM | POA: Diagnosis not present

## 2023-08-03 DIAGNOSIS — Z79899 Other long term (current) drug therapy: Secondary | ICD-10-CM | POA: Diagnosis not present

## 2023-08-03 DIAGNOSIS — I4819 Other persistent atrial fibrillation: Secondary | ICD-10-CM | POA: Diagnosis not present

## 2023-08-03 DIAGNOSIS — I081 Rheumatic disorders of both mitral and tricuspid valves: Secondary | ICD-10-CM | POA: Diagnosis not present

## 2023-08-03 DIAGNOSIS — Z794 Long term (current) use of insulin: Secondary | ICD-10-CM | POA: Diagnosis not present

## 2023-08-04 DIAGNOSIS — I4819 Other persistent atrial fibrillation: Secondary | ICD-10-CM | POA: Diagnosis not present

## 2023-08-04 DIAGNOSIS — I493 Ventricular premature depolarization: Secondary | ICD-10-CM | POA: Diagnosis not present

## 2023-08-04 DIAGNOSIS — Z8679 Personal history of other diseases of the circulatory system: Secondary | ICD-10-CM | POA: Insufficient documentation

## 2023-08-04 DIAGNOSIS — I272 Pulmonary hypertension, unspecified: Secondary | ICD-10-CM | POA: Diagnosis not present

## 2023-08-04 DIAGNOSIS — Z9889 Other specified postprocedural states: Secondary | ICD-10-CM | POA: Insufficient documentation

## 2023-08-04 DIAGNOSIS — E1142 Type 2 diabetes mellitus with diabetic polyneuropathy: Secondary | ICD-10-CM | POA: Diagnosis not present

## 2023-08-04 DIAGNOSIS — I129 Hypertensive chronic kidney disease with stage 1 through stage 4 chronic kidney disease, or unspecified chronic kidney disease: Secondary | ICD-10-CM | POA: Diagnosis not present

## 2023-08-04 DIAGNOSIS — I483 Typical atrial flutter: Secondary | ICD-10-CM | POA: Diagnosis not present

## 2023-08-04 HISTORY — DX: Personal history of other diseases of the circulatory system: Z86.79

## 2023-08-06 ENCOUNTER — Other Ambulatory Visit: Payer: Self-pay | Admitting: Family Medicine

## 2023-08-06 DIAGNOSIS — M8588 Other specified disorders of bone density and structure, other site: Secondary | ICD-10-CM

## 2023-08-06 DIAGNOSIS — M8580 Other specified disorders of bone density and structure, unspecified site: Secondary | ICD-10-CM

## 2023-08-09 ENCOUNTER — Other Ambulatory Visit: Payer: Self-pay | Admitting: Family Medicine

## 2023-08-09 DIAGNOSIS — N529 Male erectile dysfunction, unspecified: Secondary | ICD-10-CM

## 2023-08-10 ENCOUNTER — Other Ambulatory Visit: Payer: Self-pay

## 2023-08-10 ENCOUNTER — Telehealth: Payer: Self-pay

## 2023-08-10 DIAGNOSIS — L219 Seborrheic dermatitis, unspecified: Secondary | ICD-10-CM

## 2023-08-10 DIAGNOSIS — N529 Male erectile dysfunction, unspecified: Secondary | ICD-10-CM

## 2023-08-10 MED ORDER — KETOCONAZOLE 2 % EX CREA
TOPICAL_CREAM | CUTANEOUS | 1 refills | Status: DC
Start: 2023-08-10 — End: 2023-10-20

## 2023-08-10 NOTE — Progress Notes (Signed)
 Clarification of how many grams for Ketoconazole. aw

## 2023-08-10 NOTE — Telephone Encounter (Signed)
 Copied from CRM [id number]. Topic: Clinical - Medication Question >> Aug 10, 2023 11:36 AM Elle L wrote: Reason for CRM: The patient is requesting to see if his normal prescription for tadalafil (CIALIS) 20 MG tablet can be sent to [facility] [address] [place] - [address] Phone: [phone] Fax: [phone] as well as 96 tablets be sent to [facility] Fax: [phone] as he would like enough medication to last him until he receives the shipment.

## 2023-08-11 NOTE — Addendum Note (Signed)
 Addended by: Forde Radon on: 08/11/2023 04:46 PM   Modules accepted: Orders

## 2023-08-11 NOTE — Telephone Encounter (Signed)
 Pt notified and he verbalized one pill is not enough, has not followed instructions with taking it every other day. I verbalized PCP message and he states back "well I may have to find someone else that will fill it". I verbalized we are more than happy to send the mail prescription but he can not get a sooner prescription. I advised I will fax the prescription once Dr.Sowles signs it off.    Unable to locate pharmacy through epic, will have to fax to mail pharmacy.

## 2023-08-12 MED ORDER — TADALAFIL 20 MG PO TABS: 20.0000 mg | ORAL_TABLET | Freq: Every day | ORAL | 0 refills | Status: AC | PRN

## 2023-08-12 NOTE — Telephone Encounter (Signed)
 Faxed to [facility]

## 2023-08-17 ENCOUNTER — Ambulatory Visit: Payer: Self-pay | Admitting: Family Medicine

## 2023-08-17 NOTE — Telephone Encounter (Signed)
  Chief Complaint: R ear fullness Symptoms: fullness, decreased hearing Frequency: Began yesterday Pertinent Negatives: Patient denies drainage, injury, pain Disposition: [] ED /[] Urgent Care (no appt availability in office) / [x] Appointment(In office/virtual)/ []  [facility]/ [] Home Care/ [x] Refused Recommended Disposition /[] [facility]/ []  Follow-up with PCP Additional Notes: Patient calls reporting R ear fullness that began yesterday. Patient reports it feels improved today, but hearing is still decreased. Per protocol, patient to be evaluated within 3 days. First available appointment with PCP outside of guideline. Patient offered appt with alt provider in clinic, patient declined stating he is seeing his cardiologist tmw and will have him look at it. States he will call back if he still needs an appt. Care advice reviewed, patient verbalized understandingand denies further questions at this time. Alerting PCP for review.   Reason for Disposition . Ear congestion present > 48 hours  Answer Assessment - Initial Assessment Questions 1. LOCATION: "Which ear is involved?"       Right ear 2. SENSATION: "Describe how the ear feels." (e.g. stuffy, full, plugged)."      Decreased hearing, stuffy and full- plugged 3. ONSET:  "When did the ear symptoms start?"       Yesterday 4. PAIN: "Do you also have an earache?" If Yes, ask: "How bad is it?" (Scale 1-10; or mild, moderate, severe)     Denies 5. CAUSE: "What do you think is causing the ear congestion?"     Unsure 6. URI: "Do you have a runny nose or cough?"      Denies 7. NASAL ALLERGIES: "Are there symptoms of hay fever, such as sneezing or a clear nasal discharge?"     Denies  Protocols used: Ear - Congestion-A-AH

## 2023-08-17 NOTE — Telephone Encounter (Signed)
 1st attempt, left voicemail for patient to call back for nurse triage.  Copied from CRM [id number]. Topic: Clinical - Medical Advice >> Aug 17, 2023 10:18 AM Tiffany B wrote: Reason for CRM: Patient states he's experiencing stuffed up right hear and would like to see PCP only in the afternoon. Patient PCP has no available appointments until March 31.

## 2023-08-18 DIAGNOSIS — I4891 Unspecified atrial fibrillation: Secondary | ICD-10-CM | POA: Diagnosis not present

## 2023-08-18 DIAGNOSIS — I4819 Other persistent atrial fibrillation: Secondary | ICD-10-CM | POA: Diagnosis not present

## 2023-08-25 ENCOUNTER — Telehealth: Payer: Self-pay | Admitting: Family Medicine

## 2023-08-25 DIAGNOSIS — Z96651 Presence of right artificial knee joint: Secondary | ICD-10-CM

## 2023-08-25 DIAGNOSIS — M25552 Pain in left hip: Secondary | ICD-10-CM

## 2023-08-25 NOTE — Telephone Encounter (Signed)
 Copied from CRM [id number]. Topic: Clinical - Medical Advice >> Aug 24, 2023  4:57 PM Haroldine Laws wrote: Reason for CRM: Patient called saying he needs new orders for PT for his knee surgery.  They had stopped them because of his heart ablation .    He wants to go back to [facility] to have the PT done.  CB#  [phone]

## 2023-08-25 NOTE — Telephone Encounter (Signed)
 Patient notified

## 2023-08-25 NOTE — Addendum Note (Signed)
 Addended by: Forde Radon on: 08/25/2023 03:31 PM   Modules accepted: Orders

## 2023-08-26 DIAGNOSIS — Z23 Encounter for immunization: Secondary | ICD-10-CM | POA: Diagnosis not present

## 2023-08-30 DIAGNOSIS — L600 Ingrowing nail: Secondary | ICD-10-CM | POA: Diagnosis not present

## 2023-08-30 DIAGNOSIS — Z96651 Presence of right artificial knee joint: Secondary | ICD-10-CM | POA: Insufficient documentation

## 2023-08-30 DIAGNOSIS — E1142 Type 2 diabetes mellitus with diabetic polyneuropathy: Secondary | ICD-10-CM | POA: Diagnosis not present

## 2023-08-30 DIAGNOSIS — L03031 Cellulitis of right toe: Secondary | ICD-10-CM | POA: Diagnosis not present

## 2023-08-30 DIAGNOSIS — B351 Tinea unguium: Secondary | ICD-10-CM | POA: Diagnosis not present

## 2023-08-30 HISTORY — DX: Presence of right artificial knee joint: Z96.651

## 2023-08-31 DIAGNOSIS — I4819 Other persistent atrial fibrillation: Secondary | ICD-10-CM | POA: Diagnosis not present

## 2023-09-02 DIAGNOSIS — M7072 Other bursitis of hip, left hip: Secondary | ICD-10-CM | POA: Diagnosis not present

## 2023-09-02 DIAGNOSIS — M6281 Muscle weakness (generalized): Secondary | ICD-10-CM | POA: Diagnosis not present

## 2023-09-02 DIAGNOSIS — R278 Other lack of coordination: Secondary | ICD-10-CM | POA: Diagnosis not present

## 2023-09-02 DIAGNOSIS — R2689 Other abnormalities of gait and mobility: Secondary | ICD-10-CM | POA: Diagnosis not present

## 2023-09-02 DIAGNOSIS — Z96651 Presence of right artificial knee joint: Secondary | ICD-10-CM | POA: Diagnosis not present

## 2023-09-13 DIAGNOSIS — M7072 Other bursitis of hip, left hip: Secondary | ICD-10-CM | POA: Diagnosis not present

## 2023-09-13 DIAGNOSIS — R2689 Other abnormalities of gait and mobility: Secondary | ICD-10-CM | POA: Diagnosis not present

## 2023-09-13 DIAGNOSIS — M6281 Muscle weakness (generalized): Secondary | ICD-10-CM | POA: Diagnosis not present

## 2023-09-13 DIAGNOSIS — R278 Other lack of coordination: Secondary | ICD-10-CM | POA: Diagnosis not present

## 2023-09-13 DIAGNOSIS — Z96651 Presence of right artificial knee joint: Secondary | ICD-10-CM | POA: Diagnosis not present

## 2023-09-14 DIAGNOSIS — H6123 Impacted cerumen, bilateral: Secondary | ICD-10-CM | POA: Diagnosis not present

## 2023-09-14 DIAGNOSIS — J340 Abscess, furuncle and carbuncle of nose: Secondary | ICD-10-CM | POA: Diagnosis not present

## 2023-09-14 DIAGNOSIS — G500 Trigeminal neuralgia: Secondary | ICD-10-CM | POA: Diagnosis not present

## 2023-09-15 DIAGNOSIS — R2689 Other abnormalities of gait and mobility: Secondary | ICD-10-CM | POA: Diagnosis not present

## 2023-09-15 DIAGNOSIS — Z96651 Presence of right artificial knee joint: Secondary | ICD-10-CM | POA: Diagnosis not present

## 2023-09-15 DIAGNOSIS — M6281 Muscle weakness (generalized): Secondary | ICD-10-CM | POA: Diagnosis not present

## 2023-09-15 DIAGNOSIS — R278 Other lack of coordination: Secondary | ICD-10-CM | POA: Diagnosis not present

## 2023-09-15 DIAGNOSIS — M7072 Other bursitis of hip, left hip: Secondary | ICD-10-CM | POA: Diagnosis not present

## 2023-09-17 DIAGNOSIS — R2689 Other abnormalities of gait and mobility: Secondary | ICD-10-CM | POA: Diagnosis not present

## 2023-09-17 DIAGNOSIS — R278 Other lack of coordination: Secondary | ICD-10-CM | POA: Diagnosis not present

## 2023-09-17 DIAGNOSIS — M6281 Muscle weakness (generalized): Secondary | ICD-10-CM | POA: Diagnosis not present

## 2023-09-17 DIAGNOSIS — Z96651 Presence of right artificial knee joint: Secondary | ICD-10-CM | POA: Diagnosis not present

## 2023-09-17 DIAGNOSIS — M7072 Other bursitis of hip, left hip: Secondary | ICD-10-CM | POA: Diagnosis not present

## 2023-09-22 ENCOUNTER — Ambulatory Visit: Admitting: Urology

## 2023-09-24 DIAGNOSIS — M6281 Muscle weakness (generalized): Secondary | ICD-10-CM | POA: Diagnosis not present

## 2023-09-24 DIAGNOSIS — M7072 Other bursitis of hip, left hip: Secondary | ICD-10-CM | POA: Diagnosis not present

## 2023-09-24 DIAGNOSIS — R2689 Other abnormalities of gait and mobility: Secondary | ICD-10-CM | POA: Diagnosis not present

## 2023-09-24 DIAGNOSIS — R278 Other lack of coordination: Secondary | ICD-10-CM | POA: Diagnosis not present

## 2023-09-24 DIAGNOSIS — Z96651 Presence of right artificial knee joint: Secondary | ICD-10-CM | POA: Diagnosis not present

## 2023-09-27 DIAGNOSIS — Z96651 Presence of right artificial knee joint: Secondary | ICD-10-CM | POA: Diagnosis not present

## 2023-09-27 DIAGNOSIS — M6281 Muscle weakness (generalized): Secondary | ICD-10-CM | POA: Diagnosis not present

## 2023-09-27 DIAGNOSIS — M7072 Other bursitis of hip, left hip: Secondary | ICD-10-CM | POA: Diagnosis not present

## 2023-09-27 DIAGNOSIS — R2689 Other abnormalities of gait and mobility: Secondary | ICD-10-CM | POA: Diagnosis not present

## 2023-09-27 DIAGNOSIS — R278 Other lack of coordination: Secondary | ICD-10-CM | POA: Diagnosis not present

## 2023-09-29 ENCOUNTER — Ambulatory Visit (INDEPENDENT_AMBULATORY_CARE_PROVIDER_SITE_OTHER): Admitting: Urology

## 2023-09-29 VITALS — BP 152/86 | HR 68 | Ht 75.0 in | Wt 200.0 lb

## 2023-09-29 DIAGNOSIS — N529 Male erectile dysfunction, unspecified: Secondary | ICD-10-CM

## 2023-09-29 DIAGNOSIS — R2689 Other abnormalities of gait and mobility: Secondary | ICD-10-CM | POA: Diagnosis not present

## 2023-09-29 DIAGNOSIS — N5201 Erectile dysfunction due to arterial insufficiency: Secondary | ICD-10-CM

## 2023-09-29 DIAGNOSIS — R278 Other lack of coordination: Secondary | ICD-10-CM | POA: Diagnosis not present

## 2023-09-29 DIAGNOSIS — M7072 Other bursitis of hip, left hip: Secondary | ICD-10-CM | POA: Diagnosis not present

## 2023-09-29 DIAGNOSIS — Z96651 Presence of right artificial knee joint: Secondary | ICD-10-CM | POA: Diagnosis not present

## 2023-09-29 DIAGNOSIS — M6281 Muscle weakness (generalized): Secondary | ICD-10-CM | POA: Diagnosis not present

## 2023-09-29 NOTE — Progress Notes (Signed)
 I, Jeremy [name], acting as a scribe for Jeremy Knapp, MD., have documented all relevant documentation on the behalf of Jeremy Knapp, MD, as directed by Jeremy Knapp, MD while in the presence of Jeremy Knapp, MD.  09/29/2023 4:15 PM   Jeremy [name] Jul 22, 1935 [id number]  Referring provider: Sowles, Krichna, MD [address] [place],  [place] [address]  Chief Complaint  Patient presents with   Erectile Dysfunction    HPI: Jeremy [name] is a 88 y.o. male called for an appointment to discuss intracavernosal injections.   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 He states he was taking every other day and was having no significant erections, and it was recommended he increase to daily. He has been taking 20 mg daily for the last month, with no significant erectile activity,  Interested in pursuing intracavernosal injections   PMH: Past Medical History:  Diagnosis Date   Allergy    Basal cell carcinoma 10/09/2021   right medial forehead at glabella, Mohs 01/22/22   Chronic kidney disease    Diabetes mellitus without complication (HCC)    Diabetic neuropathy (HCC)    Diverticula, colon 1980's   Diverticulosis    Enlarged prostate    GERD (gastroesophageal reflux disease)    Glaucoma    Hemorrhoids    High cholesterol    History of hiatal hernia    Hyperlipidemia    Hypertension    Obesity    Onychomycosis    Seizures (HCC)    Small bowel obstruction (HCC) 1990's   Urination disorder     Surgical History: Past Surgical History:  Procedure Laterality Date   APPENDECTOMY     BLADDER DIVERTICULECTOMY  2013   CARDIOVERSION N/A 02/24/2023   Procedure: CARDIOVERSION;  Surgeon: Percival Brace, MD;  Location: [facility] ORS;  Service: Cardiovascular;  Laterality: N/A;   CARDIOVERSION N/A 04/21/2023   Procedure: CARDIOVERSION;  Surgeon: Percival Brace, MD;  Location: [facility];   Service: Cardiovascular;  Laterality: N/A;   CARPAL TUNNEL RELEASE  01/2022   revision right side   COLON SURGERY Left 1980's   COLONOSCOPY W/ POLYPECTOMY  1990's   Dr Jeremy [name]   COLONOSCOPY WITH PROPOFOL N/A 12/21/2014   Procedure: COLONOSCOPY WITH PROPOFOL;  Surgeon: Jeremy Fly, MD;  Location: [facility] ENDOSCOPY;  Service: Endoscopy;  Laterality: N/A;   COLONOSCOPY WITH PROPOFOL N/A 08/16/2017   Procedure: COLONOSCOPY WITH PROPOFOL;  Surgeon: Jeremy Fly, MD;  Location: [facility] ENDOSCOPY;  Service: Endoscopy;  Laterality: N/A;   COLONOSCOPY WITH PROPOFOL N/A 01/31/2021   Procedure: COLONOSCOPY WITH PROPOFOL;  Surgeon: Jeremy Skeeter, MD;  Location: [facility] ENDOSCOPY;  Service: Endoscopy;  Laterality: N/A;   ESOPHAGOGASTRODUODENOSCOPY (EGD) WITH PROPOFOL N/A 02/15/2015   Procedure: ESOPHAGOGASTRODUODENOSCOPY (EGD) WITH PROPOFOL;  Surgeon: Jeremy Fly, MD;  Location: [facility] ENDOSCOPY;  Service: Endoscopy;  Laterality: N/A;   ESOPHAGOGASTRODUODENOSCOPY (EGD) WITH PROPOFOL N/A 06/10/2017   Procedure: ESOPHAGOGASTRODUODENOSCOPY (EGD) WITH PROPOFOL;  Surgeon: Jeremy Fly, MD;  Location: [facility] ENDOSCOPY;  Service: Endoscopy;  Laterality: N/A;   ESOPHAGOGASTRODUODENOSCOPY (EGD) WITH PROPOFOL N/A 08/16/2017   Procedure: ESOPHAGOGASTRODUODENOSCOPY (EGD) WITH PROPOFOL;  Surgeon: Jeremy Fly, MD;  Location: [facility] ENDOSCOPY;  Service: Endoscopy;  Laterality: N/A;   ESOPHAGOGASTRODUODENOSCOPY (EGD) WITH PROPOFOL N/A 01/31/2021   Procedure: ESOPHAGOGASTRODUODENOSCOPY (EGD) WITH PROPOFOL;  Surgeon: Jeremy Skeeter, MD;  Location: [facility] ENDOSCOPY;  Service: Endoscopy;  Laterality: N/A;   EYE  SURGERY     GLAUCOMA SURGERY Left 1990's   [facility]   HEMORROIDECTOMY  1980's   [facility]   HERNIA REPAIR     [facility], Dr Jeremy [name] HERNIA REPAIR Bilateral [phone]   LARYNX SURGERY  1990   3 times   MOHS SURGERY  01/22/2022   Dr. Bartholomew Light - [facility]   REPLACEMENT TOTAL  KNEE Right    RETINAL DETACHMENT SURGERY Right    TONSILLECTOMY     turp  2013   URETER SURGERY  2013   Dr Jeremy [name]    Home Medications:  Allergies as of 09/29/2023       Reactions   Demerol [meperidine] Other (See Comments), Anaphylaxis   MAKES BLOOD PRESSURE BOTTOM OUT hypotension   Metoprolol Shortness Of Breath   Dilaudid [hydromorphone Hcl]    MAKES BLOOD PRESSURE BOTTOM OUT   Actos [pioglitazone] Other (See Comments), Hives   Dapagliflozin Rash   Penicillins Rash        Medication List        Accurate as of September 29, 2023  4:15 PM. If you have any questions, ask your nurse or doctor.          acetaminophen 500 MG tablet Commonly known as: TYLENOL Take 1,000 mg by mouth 2 (two) times daily as needed for moderate pain (pain score 4-6).   Bydureon BCise 2 MG/0.85ML Auij Generic drug: Exenatide ER Inject 2 mg into the skin every Sunday.   Combigan 0.2-0.5 % ophthalmic solution Generic drug: brimonidine-timolol Place 1 drop into the left eye 2 (two) times daily.   desonide 0.05 % ointment Commonly known as: DESOWEN Apply 1 Application topically daily as needed (irritation).   diclofenac Sodium 1 % Gel Commonly known as: Voltaren Apply 4 g topically 4 (four) times daily.   diltiazem 120 MG 24 hr capsule Commonly known as: CARDIZEM CD Take 120 mg by mouth daily.   dorzolamide 2 % ophthalmic solution Commonly known as: TRUSOPT Place 1 drop into the left eye 2 (two) times daily.   Eliquis 5 MG Tabs tablet Generic drug: apixaban Take 5 mg by mouth 2 (two) times daily.   finasteride 5 MG tablet Commonly known as: PROSCAR Take 1 tablet by mouth once daily   flecainide 50 MG tablet Commonly known as: TAMBOCOR Take 100 mg by mouth 2 (two) times daily.   glipiZIDE 10 MG tablet Commonly known as: GLUCOTROL Take 10 mg by mouth daily.   hydrocortisone 2.5 % cream APPLY TO THE AFFECTED AREA OF FACE AND EARS EVERY OTHER NIGHT ALTERNATING WITH KETOCONAZOLE  CREAM What changed:  how much to take how to take this when to take this reasons to take this additional instructions   Jardiance 25 MG Tabs tablet Generic drug: empagliflozin Take 25 mg by mouth daily.   ketoconazole 2 % shampoo Commonly known as: NIZORAL Apply 1 Application topically once a week.   ketoconazole 2 % cream Commonly known as: NIZORAL Apply 1 gram to penis twice daily   loperamide 2 MG tablet Commonly known as: IMODIUM A-D Take 2 mg by mouth as needed for diarrhea or loose stools.   loratadine 10 MG tablet Commonly known as: CLARITIN Take 10 mg by mouth daily.   metFORMIN 1000 MG tablet Commonly known as: GLUCOPHAGE Take 1,000 mg by mouth 2 (two) times daily with a meal. On Monday and Wednesday   MOMETASONE FUROATE EX Apply 1 Application topically daily as needed (itchy scalp).   multivitamin tablet  Take 1 tablet by mouth daily.   omega-3 acid ethyl esters 1 g capsule Commonly known as: LOVAZA Take 2 capsules (2 g total) by mouth 2 (two) times daily.   omeprazole 20 MG capsule Commonly known as: PRILOSEC Take 1 capsule (20 mg total) by mouth daily.   rosuvastatin 10 MG tablet Commonly known as: CRESTOR Take 1 tablet (10 mg total) by mouth at bedtime.   tadalafil 20 MG tablet Commonly known as: CIALIS Take 1 tablet (20 mg total) by mouth daily as needed for erectile dysfunction.   telmisartan-hydrochlorothiazide 80-12.5 MG tablet Commonly known as: MICARDIS HCT Take 1 tablet by mouth daily.   Tresiba FlexTouch 200 UNIT/ML FlexTouch Pen Generic drug: insulin degludec Inject 16 Units into the skin in the morning.        Allergies:  Allergies  Allergen Reactions   Demerol [Meperidine] Other (See Comments) and Anaphylaxis    MAKES BLOOD PRESSURE BOTTOM OUT hypotension   Metoprolol Shortness Of Breath   Dilaudid [Hydromorphone Hcl]     MAKES BLOOD PRESSURE BOTTOM OUT   Actos [Pioglitazone] Other (See Comments) and Hives    Dapagliflozin Rash   Penicillins Rash    Family History: Family History  Problem Relation Age of Onset   Diabetes Mother    Cataracts Mother    Metabolic syndrome Mother    Blindness Mother        r/t diabetes   Cataracts Father    Glaucoma Father        Retina-Detachment   Atrial fibrillation Father    Diverticulitis Sister    Glaucoma Brother    Diabetes Brother    Cancer Sister        Gallbladder   Cancer Daughter     Social History:  reports that he has never smoked. He has never used smokeless tobacco. He reports that he does not drink alcohol and does not use drugs.   Physical Exam: BP (!) 152/86   Pulse 68   Ht 6\' 3"  (1.905 m)   Wt 200 lb (90.7 kg)   BMI 25.00 kg/m   Constitutional:  Alert and oriented, No acute distress. HEENT: [place] AT, moist mucus membranes.  Trachea midline, no masses. Cardiovascular: No clubbing, cyanosis, or edema. Respiratory: Normal respiratory effort, no increased work of breathing. GI: Abdomen is soft, nontender, nondistended, no abdominal masses Skin: No rashes, bruises or suspicious lesions. Neurologic: Grossly intact, no focal deficits, moving all 4 extremities. Psychiatric: Normal mood and affect.   Assessment & Plan:    1. Erectile dysfunction PDE5 inhibitor refractory He desires to start intracavernosal injections. We discussed potential complications of priapism and rarely corporal scarring. He was examined and I showed him the landmarks and injection sites as he declined a in-office injection training visit Also requested a compounded treatmen preparation that can be shipped to his home. Rx prostaglandin was sent to [facility].  [facility] [address], [address] [place], [place] [address] [phone]

## 2023-09-29 NOTE — Patient Instructions (Signed)
 [email or web address]  [email or web address]

## 2023-09-30 ENCOUNTER — Encounter: Payer: Self-pay | Admitting: Urology

## 2023-10-01 DIAGNOSIS — R278 Other lack of coordination: Secondary | ICD-10-CM | POA: Diagnosis not present

## 2023-10-01 DIAGNOSIS — R2689 Other abnormalities of gait and mobility: Secondary | ICD-10-CM | POA: Diagnosis not present

## 2023-10-01 DIAGNOSIS — M7072 Other bursitis of hip, left hip: Secondary | ICD-10-CM | POA: Diagnosis not present

## 2023-10-01 DIAGNOSIS — M6281 Muscle weakness (generalized): Secondary | ICD-10-CM | POA: Diagnosis not present

## 2023-10-01 DIAGNOSIS — Z96651 Presence of right artificial knee joint: Secondary | ICD-10-CM | POA: Diagnosis not present

## 2023-10-04 DIAGNOSIS — M6281 Muscle weakness (generalized): Secondary | ICD-10-CM | POA: Diagnosis not present

## 2023-10-04 DIAGNOSIS — R2689 Other abnormalities of gait and mobility: Secondary | ICD-10-CM | POA: Diagnosis not present

## 2023-10-04 DIAGNOSIS — R278 Other lack of coordination: Secondary | ICD-10-CM | POA: Diagnosis not present

## 2023-10-04 DIAGNOSIS — Z96651 Presence of right artificial knee joint: Secondary | ICD-10-CM | POA: Diagnosis not present

## 2023-10-04 DIAGNOSIS — M7072 Other bursitis of hip, left hip: Secondary | ICD-10-CM | POA: Diagnosis not present

## 2023-10-06 DIAGNOSIS — M6281 Muscle weakness (generalized): Secondary | ICD-10-CM | POA: Diagnosis not present

## 2023-10-06 DIAGNOSIS — M7072 Other bursitis of hip, left hip: Secondary | ICD-10-CM | POA: Diagnosis not present

## 2023-10-06 DIAGNOSIS — Z96651 Presence of right artificial knee joint: Secondary | ICD-10-CM | POA: Diagnosis not present

## 2023-10-06 DIAGNOSIS — R278 Other lack of coordination: Secondary | ICD-10-CM | POA: Diagnosis not present

## 2023-10-06 DIAGNOSIS — R2689 Other abnormalities of gait and mobility: Secondary | ICD-10-CM | POA: Diagnosis not present

## 2023-10-08 DIAGNOSIS — Z96651 Presence of right artificial knee joint: Secondary | ICD-10-CM | POA: Diagnosis not present

## 2023-10-08 DIAGNOSIS — R2689 Other abnormalities of gait and mobility: Secondary | ICD-10-CM | POA: Diagnosis not present

## 2023-10-08 DIAGNOSIS — M6281 Muscle weakness (generalized): Secondary | ICD-10-CM | POA: Diagnosis not present

## 2023-10-08 DIAGNOSIS — M7072 Other bursitis of hip, left hip: Secondary | ICD-10-CM | POA: Diagnosis not present

## 2023-10-08 DIAGNOSIS — R278 Other lack of coordination: Secondary | ICD-10-CM | POA: Diagnosis not present

## 2023-10-11 DIAGNOSIS — R278 Other lack of coordination: Secondary | ICD-10-CM | POA: Diagnosis not present

## 2023-10-11 DIAGNOSIS — R2689 Other abnormalities of gait and mobility: Secondary | ICD-10-CM | POA: Diagnosis not present

## 2023-10-11 DIAGNOSIS — M6281 Muscle weakness (generalized): Secondary | ICD-10-CM | POA: Diagnosis not present

## 2023-10-11 DIAGNOSIS — Z96651 Presence of right artificial knee joint: Secondary | ICD-10-CM | POA: Diagnosis not present

## 2023-10-11 DIAGNOSIS — M7072 Other bursitis of hip, left hip: Secondary | ICD-10-CM | POA: Diagnosis not present

## 2023-10-12 ENCOUNTER — Ambulatory Visit: Payer: Self-pay

## 2023-10-12 NOTE — Telephone Encounter (Deleted)
 Answer Assessment - Initial Assessment Questions 1. MECHANISM: "How did the injury happen?" (e.g., twisting injury, direct blow)      Thin skin, 2. ONSET: "When did the injury happen?" (Minutes or hours ago)      Ongoing-worse since last week 3. LOCATION: "Where is the injury located?"      L shin 4. APPEARANCE of INJURY: "What does the injury look like?"  (e.g., deformity of leg)     Draining, raw 5. SEVERITY: "Can you put weight on that leg?" "Can you walk?"      Can still walk 6. SIZE: For cuts, bruises, or swelling, ask: "How large is it?" (e.g., inches or centimeters)      10-12 cm 7. PAIN: "Is there pain?" If Yes, ask: "How bad is the pain?"   "What does it keep you from doing?" (e.g., Scale 1-10; or mild, moderate, severe)   -  NONE: (0): no pain   -  MILD (1-3): doesn't interfere with normal activities    -  MODERATE (4-7): interferes with normal activities (e.g., work or school) or awakens from sleep, limping    -  SEVERE (8-10): excruciating pain, unable to do any normal activities, unable to walk     After the initial injury was shooting pain, "settles down now" 8. TETANUS: For any breaks in the skin, ask: "When was the last tetanus booster?"     UTD 9. OTHER SYMPTOMS: "Do you have any other symptoms?"      Fever,  Protocols used: Leg Injury-A-AH

## 2023-10-12 NOTE — Telephone Encounter (Signed)
  Chief Complaint: L shin wound Symptoms: open area 10-12 cm Frequency: about a week Pertinent Negatives: Patient denies fever, redness Disposition: [] ED /[] Urgent Care (no appt availability in office) / [x] Appointment(In office/virtual)/ []  [facility]/ [] Home Care/ [] Refused Recommended Disposition /[] [facility]/ []  Follow-up with PCP Additional Notes: Pt states that he had a spot there and was using a pillow to protect the area during PT. When completed with PT pt states that the skin was gone. Pt states that he is DM2. Pt states that he is asymptomatic with the exception of the wound. Pt was already scheduled prior to triage.   Reason for Disposition . [1] After 3 days AND [2] pain not improved  Answer Assessment - Initial Assessment Questions 1. MECHANISM: "How did the injury happen?" (e.g., twisting injury, direct blow)      Thin skin, DM@. During PT had used a pillow and had rubbed some skin off 2. ONSET: "When did the injury happen?" (Minutes or hours ago)      Last week.  3. LOCATION: "Where is the injury located?"      L shin 4. APPEARANCE of INJURY: "What does the injury look like?"  (e.g., deformity of leg)     Draining, raw 5. SEVERITY: "Can you put weight on that leg?" "Can you walk?"      Can walk, still working 6. SIZE: For cuts, bruises, or swelling, ask: "How large is it?" (e.g., inches or centimeters)      10-12 cm 7. PAIN: "Is there pain?" If Yes, ask: "How bad is the pain?"   "What does it keep you from doing?" (e.g., Scale 1-10; or mild, moderate, severe)   -  NONE: (0): no pain   -  MILD (1-3): doesn't interfere with normal activities    -  MODERATE (4-7): interferes with normal activities (e.g., work or school) or awakens from sleep, limping    -  SEVERE (8-10): excruciating pain, unable to do any normal activities, unable to walk     Shooting pain initially, has settled down now 8. TETANUS: For any breaks in the skin, ask: "When was the  last tetanus booster?"     UTD 9. OTHER SYMPTOMS: "Do you have any other symptoms?"      denies  Protocols used: Leg Injury-A-AH

## 2023-10-12 NOTE — Telephone Encounter (Signed)
 Copied from CRM [id number]. Topic: Appointments - Appointment Scheduling >> Oct 12, 2023 11:18 AM Emylou G wrote: Lesion on left leg.. worsening...number on file is good.. pls call

## 2023-10-12 NOTE — Telephone Encounter (Signed)
 Pt called and given care instructions until appt

## 2023-10-14 ENCOUNTER — Ambulatory Visit (INDEPENDENT_AMBULATORY_CARE_PROVIDER_SITE_OTHER): Admitting: Internal Medicine

## 2023-10-14 VITALS — BP 142/80 | HR 94 | Temp 98.2°F | Resp 16 | Ht 75.5 in | Wt 202.0 lb

## 2023-10-14 DIAGNOSIS — R600 Localized edema: Secondary | ICD-10-CM

## 2023-10-14 MED ORDER — FUROSEMIDE 20 MG PO TABS: 20.0000 mg | ORAL_TABLET | Freq: Every day | ORAL | 0 refills | Status: DC

## 2023-10-14 NOTE — Progress Notes (Signed)
 Acute Office Visit  Subjective:     Patient ID: Jeremy [name], male    DOB: [birth year], 88 y.o.   MRN: [id number]  Chief Complaint  Patient presents with   Leg Swelling    HPI Patient is in today for leg swelling and skin change.   Discussed the use of AI scribe software for clinical note transcription with the patient, who gave verbal consent to proceed.  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  Significant swelling and skin trauma on the right leg began approximately one week ago after a physical therapy session involving a machine with a roller. The skin is thin over the shin, with more pronounced swelling on the right side. He has been using medicated nonstick bandages and elevation for management. The area is reepithelializing, and swelling has slowed. No current pain, but warmth is present in the affected area.  He is on Eliquis following a cardiac ablation for atrial fibrillation and takes hydrochlorothiazide  for blood pressure. He recalls a similar lesion on his ankle 10-15 years ago, treated with calamine-treated gauze wraps. He believes fluid retention is hindering healing.  He manages diabetes with 20-22 units of insulin daily and feels generally well otherwise. No fever or infection is present.    Review of Systems  Constitutional:  Negative for chills and fever.  Cardiovascular:  Positive for leg swelling.        Objective:    BP (!) 142/80 (BP Location: Left Arm, Cuff Size: Large)   Pulse 94   Temp 98.2 F (36.8 C) (Other (Comment))   Resp 16   Ht 6' 3.5" (1.918 m)   Wt 202 lb (91.6 kg)   BMI 24.91 kg/m    Physical Exam Constitutional:      Appearance: Normal appearance.  HENT:     Head: Normocephalic and atraumatic.  Eyes:     Conjunctiva/sclera: Conjunctivae normal.  Cardiovascular:     Rate and Rhythm: Normal rate and regular  rhythm.  Pulmonary:     Effort: Pulmonary effort is normal.     Breath sounds: Normal breath sounds.  Musculoskeletal:        General: Swelling and tenderness present.     Right lower leg: No edema.     Left lower leg: Edema present.     Comments: Venous stasis with fluid draining from anterior shin, 2+ pitting edema on the left, warm to the touch, no signs of infection or purulent drainage.   Skin:    General: Skin is warm and dry.     Findings: Erythema present.  Neurological:     General: No focal deficit present.     Mental Status: He is alert. Mental status is at baseline.  Psychiatric:        Mood and Affect: Mood normal.        Behavior: Behavior normal.     No results found for any visits on 10/14/23.      Assessment & Plan:   Assessment & Plan Edema and swelling of the leg Significant right leg edema post-trauma, likely due to fluid retention. Area warm, not infected, reepithelializing. Differential includes trauma-related swelling and fluid retention. - Initiated Lasix  20 mg for five days. - Applied compression wrap to manage swelling. - Scheduled follow-up early next week. - Consider wound care referral if no improvement.   - furosemide  (LASIX ) 20  MG tablet; Take 1 tablet (20 mg total) by mouth daily.  Dispense: 6 tablet; Refill: 0    Return in about 5 days (around 10/19/2023).  Rockney Cid, DO

## 2023-10-19 ENCOUNTER — Encounter: Payer: Self-pay | Admitting: Family Medicine

## 2023-10-19 ENCOUNTER — Ambulatory Visit: Admitting: Family Medicine

## 2023-10-19 VITALS — BP 138/82 | HR 97 | Resp 18 | Ht 75.5 in | Wt 201.5 lb

## 2023-10-19 DIAGNOSIS — Z7901 Long term (current) use of anticoagulants: Secondary | ICD-10-CM | POA: Diagnosis not present

## 2023-10-19 DIAGNOSIS — R600 Localized edema: Secondary | ICD-10-CM | POA: Diagnosis not present

## 2023-10-19 DIAGNOSIS — S81802D Unspecified open wound, left lower leg, subsequent encounter: Secondary | ICD-10-CM | POA: Diagnosis not present

## 2023-10-19 NOTE — Progress Notes (Signed)
 Name: Jeremy [name]   MRN: [id number]    DOB: June 16, 1935   Date:10/19/2023       Progress Note  Subjective  Chief Complaint  Chief Complaint  Patient presents with   Leg Swelling    Swelling L leg, Dr.Andrews gave him Lasix  and showed some improvement   Discussed the use of AI scribe software for clinical note transcription with the patient, who gave verbal consent to proceed.  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  He experienced significant swelling in the left leg following a physical therapy session, where they used a machine where the lower extremity is used to either push bar up or pull it down, due to frail skin on left lower leg he had never done the exercise with LL leg until last week , they used a pillow between the machine and anterior left lower leg but the skin still broke and leg became swollen the evening after the activity  He has a history of thin epithelium over the shin, which has previously required a bandaid for protection.  The swelling has decreased by about 30% over the past week and a half but remains present, particularly in the foot. He has been keeping the leg elevated to manage the swelling. He took last lasix  today   He has not changed the dressing himself; it was applied by medical staff about a week ago. No pain in the calf area, but the area where the skin was injured is painful and raw. He also takes Eliquis and unlikely to be DVT since symptoms on anterior lower leg and not calf  He is currently on Eliquis, Cardizem, and Flecainide. He is in normal sinus rhythm following an ablation procedure and monitoring but continues on Eliquis as a precaution. No shortness of breath or other systemic symptoms.    Patient Active Problem List   Diagnosis Date Noted   Anticoagulant long-term use 06/03/2023   Family history of Lynch syndrome 06/03/2023    Insomnia 03/11/2023   Anemia 03/11/2023   Hypertension, benign 03/11/2023   Type 2 diabetes mellitus with microalbuminuria, with long-term current use of insulin (HCC) 03/11/2023   History of total knee replacement 03/11/2023   Persistent atrial fibrillation (HCC) 02/16/2023   Thrombocytopenia (HCC) 06/03/2022   B12 deficiency 06/03/2022   Basal cell carcinoma (BCC) of skin of face 11/03/2021   Dyslipidemia associated with type 2 diabetes mellitus (HCC) 11/03/2021   Impingement syndrome of shoulder region 11/16/2018   Atherosclerosis of aorta (HCC) 07/21/2018   History of diverticulitis of colon 11/15/2017   Hyperlipidemia due to type 2 diabetes mellitus (HCC) 03/14/2017   Bilateral lower extremity edema 03/27/2016   Vitamin D  deficiency 12/05/2015   Hx of transient ischemic attack (TIA) 09/17/2015   Diabetes mellitus type 2 without retinopathy (HCC) 05/31/2015   Hyperlipidemia 05/30/2015   Type 2 diabetes mellitus with renal manifestations (HCC) 01/08/2015   Incisional hernia, without obstruction or gangrene 03/23/2013   Enlarged prostate with lower urinary tract symptoms (LUTS) 11/03/2012   Myopic degeneration, bilateral 08/12/2012   Nonexudative age-related macular degeneration 07/15/2012   Anisometropia 04/08/2012   Pseudophakia 04/08/2012   History of retinal detachment 04/08/2012   Presence of intraocular lens 04/08/2012   Secondary open-angle glaucoma of left eye, severe stage 01/13/2012   Benign essential HTN 02/03/2007   Hypertension associated with diabetes (HCC) 02/03/2007    Past  Surgical History:  Procedure Laterality Date   APPENDECTOMY     BLADDER DIVERTICULECTOMY  2013   CARDIOVERSION N/A 02/24/2023   Procedure: CARDIOVERSION;  Surgeon: Percival Brace, MD;  Location: [facility] ORS;  Service: Cardiovascular;  Laterality: N/A;   CARDIOVERSION N/A 04/21/2023   Procedure: CARDIOVERSION;  Surgeon: Percival Brace, MD;  Location: [facility] ORS;  Service:  Cardiovascular;  Laterality: N/A;   CARPAL TUNNEL RELEASE  01/2022   revision right side   COLON SURGERY Left 1980's   COLONOSCOPY W/ POLYPECTOMY  1990's   Dr [name]   COLONOSCOPY WITH PROPOFOL  N/A 12/21/2014   Procedure: COLONOSCOPY WITH PROPOFOL ;  Surgeon: [name], MD;  Location: [facility] ENDOSCOPY;  Service: Endoscopy;  Laterality: N/A;   COLONOSCOPY WITH PROPOFOL  N/A 08/16/2017   Procedure: COLONOSCOPY WITH PROPOFOL ;  Surgeon: [name], MD;  Location: [facility] ENDOSCOPY;  Service: Endoscopy;  Laterality: N/A;   COLONOSCOPY WITH PROPOFOL  N/A 01/31/2021   Procedure: COLONOSCOPY WITH PROPOFOL ;  Surgeon: [name], MD;  Location: [facility] ENDOSCOPY;  Service: Endoscopy;  Laterality: N/A;   ESOPHAGOGASTRODUODENOSCOPY (EGD) WITH PROPOFOL  N/A 02/15/2015   Procedure: ESOPHAGOGASTRODUODENOSCOPY (EGD) WITH PROPOFOL ;  Surgeon: [name], MD;  Location: [facility] ENDOSCOPY;  Service: Endoscopy;  Laterality: N/A;   ESOPHAGOGASTRODUODENOSCOPY (EGD) WITH PROPOFOL  N/A 06/10/2017   Procedure: ESOPHAGOGASTRODUODENOSCOPY (EGD) WITH PROPOFOL ;  Surgeon: [name], MD;  Location: [facility] ENDOSCOPY;  Service: Endoscopy;  Laterality: N/A;   ESOPHAGOGASTRODUODENOSCOPY (EGD) WITH PROPOFOL  N/A 08/16/2017   Procedure: ESOPHAGOGASTRODUODENOSCOPY (EGD) WITH PROPOFOL ;  Surgeon: [name], MD;  Location: [facility] ENDOSCOPY;  Service: Endoscopy;  Laterality: N/A;   ESOPHAGOGASTRODUODENOSCOPY (EGD) WITH PROPOFOL  N/A 01/31/2021   Procedure: ESOPHAGOGASTRODUODENOSCOPY (EGD) WITH PROPOFOL ;  Surgeon: [name], MD;  Location: [facility] ENDOSCOPY;  Service: Endoscopy;  Laterality: N/A;   EYE SURGERY     GLAUCOMA SURGERY Left 1990's   [facility]   HEMORROIDECTOMY  [phone]   [facility]   HERNIA REPAIR     [facility], Dr [name] HERNIA REPAIR Bilateral [phone]   LARYNX SURGERY  1990   3 times   MOHS SURGERY  01/22/2022   Dr[facility]w [facility]   REPLACEMENT TOTAL KNEE  Right    RETINAL DETACHMENT SURGERY Right    TONSILLECTOMY     turp  2013   URETER SURGERY  2013   Dr [name]    Family History  Problem Relation Age of Onset   Diabetes Mother    Cataracts Mother    Metabolic syndrome Mother    Blindness Mother        r/t diabetes   Cataracts Father    Glaucoma Father        Retina-Detachment   Atrial fibrillation Father    Diverticulitis Sister    Glaucoma Brother    Diabetes Brother    Cancer Sister        Gallbladder   Cancer Daughter     Social History   Tobacco Use   Smoking status: Never   Smokeless tobacco: Never   Tobacco comments:    smoking cessation materials not required  Substance Use Topics   Alcohol use: No     Current Outpatient Medications:    acetaminophen  (TYLENOL ) 500 MG tablet, Take 1,000 mg by mouth 2 (two) times daily as needed for moderate pain (pain score 4-6)., Disp: , Rfl:    apixaban (ELIQUIS) 5 MG TABS tablet, Take 5 mg by mouth 2 (two) times daily., Disp: , Rfl:  brimonidine-timolol (COMBIGAN) 0.2-0.5 % ophthalmic solution, Place 1 drop into the left eye 2 (two) times daily., Disp: , Rfl:    desonide  (DESOWEN ) 0.05 % ointment, Apply 1 Application topically daily as needed (irritation)., Disp: , Rfl:    diclofenac  Sodium (VOLTAREN ) 1 % GEL, Apply 4 g topically 4 (four) times daily., Disp: 300 g, Rfl: 1   diltiazem (CARDIZEM CD) 120 MG 24 hr capsule, Take 120 mg by mouth daily., Disp: , Rfl:    dorzolamide (TRUSOPT) 2 % ophthalmic solution, Place 1 drop into the left eye 2 (two) times daily., Disp: , Rfl: 3   empagliflozin (JARDIANCE) 25 MG TABS tablet, Take 25 mg by mouth daily., Disp: , Rfl:    Exenatide  ER (BYDUREON  BCISE) 2 MG/0.85ML AUIJ, Inject 2 mg into the skin every Sunday., Disp: , Rfl:    finasteride  (PROSCAR ) 5 MG tablet, Take 1 tablet by mouth once daily, Disp: 90 tablet, Rfl: 1   flecainide (TAMBOCOR) 50 MG tablet, Take 100 mg by mouth 2 (two) times daily., Disp: , Rfl:    furosemide  (LASIX )  20 MG tablet, Take 1 tablet (20 mg total) by mouth daily., Disp: 6 tablet, Rfl: 0   glipiZIDE (GLUCOTROL) 10 MG tablet, Take 10 mg by mouth daily., Disp: , Rfl:    hydrocortisone  2.5 % cream, APPLY TO THE AFFECTED AREA OF FACE AND EARS EVERY OTHER NIGHT ALTERNATING WITH KETOCONAZOLE  CREAM (Patient taking differently: Apply 1 Application topically daily as needed (itching around the face).), Disp: 28 g, Rfl: 0   insulin degludec (TRESIBA FLEXTOUCH) 200 UNIT/ML FlexTouch Pen, Inject 16 Units into the skin in the morning., Disp: , Rfl:    ketoconazole  (NIZORAL ) 2 % cream, Apply 1 gram to penis twice daily, Disp: 60 g, Rfl: 1   ketoconazole  (NIZORAL ) 2 % shampoo, Apply 1 Application topically once a week., Disp: , Rfl:    loperamide (IMODIUM A-D) 2 MG tablet, Take 2 mg by mouth as needed for diarrhea or loose stools., Disp: , Rfl:    loratadine (CLARITIN) 10 MG tablet, Take 10 mg by mouth daily., Disp: , Rfl:    metFORMIN  (GLUCOPHAGE ) 1000 MG tablet, Take 1,000 mg by mouth 2 (two) times daily with a meal. On Monday and Wednesday, Disp: , Rfl:    MOMETASONE  FUROATE EX, Apply 1 Application topically daily as needed (itchy scalp)., Disp: , Rfl:    Multiple Vitamin (MULTIVITAMIN) tablet, Take 1 tablet by mouth daily., Disp: , Rfl:    omega-3 acid ethyl esters (LOVAZA ) 1 g capsule, Take 2 capsules (2 g total) by mouth 2 (two) times daily., Disp: 480 capsule, Rfl: 3   omeprazole  (PRILOSEC) 20 MG capsule, Take 1 capsule (20 mg total) by mouth daily., Disp: 90 capsule, Rfl: 1   rosuvastatin  (CRESTOR ) 10 MG tablet, Take 1 tablet (10 mg total) by mouth at bedtime., Disp: 90 tablet, Rfl: 1   tadalafil  (CIALIS ) 20 MG tablet, Take 1 tablet (20 mg total) by mouth daily as needed for erectile dysfunction., Disp: 90 tablet, Rfl: 0   telmisartan -hydrochlorothiazide  (MICARDIS  HCT) 80-12.5 MG tablet, Take 1 tablet by mouth daily., Disp: 90 tablet, Rfl: 1  Allergies  Allergen Reactions   Demerol [Meperidine] Other (See  Comments) and Anaphylaxis    MAKES BLOOD PRESSURE BOTTOM OUT hypotension   Metoprolol Shortness Of Breath   Dilaudid [Hydromorphone Hcl]     MAKES BLOOD PRESSURE BOTTOM OUT   Actos [Pioglitazone] Other (See Comments) and Hives   Dapagliflozin Rash   Penicillins Rash  I personally reviewed active problem list, medication list, allergies with the patient/caregiver today.   ROS  Ten systems reviewed and is negative except as mentioned in HPI    Objective Physical Exam Constitutional: Patient appears well-developed and well-nourished. Obese  No distress.  HEENT: head atraumatic, normocephalic, pupils equal and reactive to light, neck supple Cardiovascular: Normal rate, regular rhythm and normal heart sounds.  No murmur heard. 2 plus pitting left foot and lower extremity  edema. Large wound on left anterior lower leg. Calf is not tender to compression Pulmonary/Chest: Effort normal and breath sounds normal. No respiratory distress. Skin: dressing removed and reapplied, healing well, see attached photos  Psychiatric: Patient has a normal mood and affect. behavior is normal. Judgment and thought content normal.    Vitals:   10/19/23 1332  BP: 138/82  Pulse: 97  Resp: 18  SpO2: 98%  Weight: 201 lb 8 oz (91.4 kg)  Height: 6' 3.5" (1.918 m)    Body mass index is 24.85 kg/m.    PHQ2/9:    10/19/2023    1:32 PM 06/21/2023    2:05 PM 05/10/2023    1:11 PM 05/06/2023    2:32 PM 01/25/2023   11:43 AM  Depression screen PHQ 2/9  Decreased Interest 0 0 0 0 0  Down, Depressed, Hopeless 0 0 0 0 0  PHQ - 2 Score 0 0 0 0 0  Altered sleeping  0 0 0 0  Tired, decreased energy  0 0 0 0  Change in appetite  0 0 0 0  Feeling bad or failure about yourself   0 0 0 0  Trouble concentrating  0 0 0 0  Moving slowly or fidgety/restless  0 0 0 0  Suicidal thoughts  0 0 0 0  PHQ-9 Score  0 0 0 0  Difficult doing work/chores  Not difficult at all  Not difficult at all Not difficult at all     phq 9 is negative  Fall Risk:    10/19/2023    1:24 PM 06/21/2023    1:57 PM 05/10/2023    1:11 PM 05/06/2023    2:38 PM 01/25/2023   11:43 AM  Fall Risk   Falls in the past year? 0 0 0 0 0  Number falls in past yr: 0 0 0 0 0  Injury with Fall? 0 0 0 0 0  Risk for fall due to : No Fall Risks No Fall Risks Orthopedic patient No Fall Risks No Fall Risks  Follow up Falls prevention discussed;Education provided;Falls evaluation completed Falls prevention discussed;Education provided;Falls evaluation completed Falls prevention discussed;Education provided;Falls evaluation completed Falls prevention discussed;Falls evaluation completed Falls prevention discussed;Education provided;Falls evaluation completed      Assessment & Plan Left leg swelling and skin injury Swelling and skin injury likely due to trauma and inflammation. No evidence of clot or infection. Swelling decreased by 30%. - Instruct to keep leg elevated to reduce swelling. -dressing changed done today during the visit  - Advise to continue physical therapy with caution, ensuring leg elevation post-sessions. - Schedule follow-up in one week to reassess.  Atrial fibrillation, post-ablation Currently in normal sinus rhythm post-ablation. On Eliquis, cardizem, and flecainide. Eliquis maintained due to high risk - Continue Eliquis, cardizem, and flecainide. - Monitor for rhythm changes or symptoms.

## 2023-10-20 ENCOUNTER — Ambulatory Visit: Payer: Medicare Other | Admitting: Dermatology

## 2023-10-20 ENCOUNTER — Encounter: Payer: Self-pay | Admitting: Dermatology

## 2023-10-20 ENCOUNTER — Other Ambulatory Visit: Payer: Self-pay | Admitting: Dermatology

## 2023-10-20 DIAGNOSIS — D229 Melanocytic nevi, unspecified: Secondary | ICD-10-CM

## 2023-10-20 DIAGNOSIS — L821 Other seborrheic keratosis: Secondary | ICD-10-CM | POA: Diagnosis not present

## 2023-10-20 DIAGNOSIS — L814 Other melanin hyperpigmentation: Secondary | ICD-10-CM

## 2023-10-20 DIAGNOSIS — R2689 Other abnormalities of gait and mobility: Secondary | ICD-10-CM | POA: Diagnosis not present

## 2023-10-20 DIAGNOSIS — W908XXA Exposure to other nonionizing radiation, initial encounter: Secondary | ICD-10-CM

## 2023-10-20 DIAGNOSIS — Z85828 Personal history of other malignant neoplasm of skin: Secondary | ICD-10-CM

## 2023-10-20 DIAGNOSIS — L720 Epidermal cyst: Secondary | ICD-10-CM | POA: Diagnosis not present

## 2023-10-20 DIAGNOSIS — L570 Actinic keratosis: Secondary | ICD-10-CM | POA: Diagnosis not present

## 2023-10-20 DIAGNOSIS — L219 Seborrheic dermatitis, unspecified: Secondary | ICD-10-CM

## 2023-10-20 DIAGNOSIS — M7072 Other bursitis of hip, left hip: Secondary | ICD-10-CM | POA: Diagnosis not present

## 2023-10-20 DIAGNOSIS — L578 Other skin changes due to chronic exposure to nonionizing radiation: Secondary | ICD-10-CM | POA: Diagnosis not present

## 2023-10-20 DIAGNOSIS — L820 Inflamed seborrheic keratosis: Secondary | ICD-10-CM

## 2023-10-20 DIAGNOSIS — M6281 Muscle weakness (generalized): Secondary | ICD-10-CM | POA: Diagnosis not present

## 2023-10-20 DIAGNOSIS — Z1283 Encounter for screening for malignant neoplasm of skin: Secondary | ICD-10-CM | POA: Diagnosis not present

## 2023-10-20 DIAGNOSIS — Z79899 Other long term (current) drug therapy: Secondary | ICD-10-CM

## 2023-10-20 DIAGNOSIS — R278 Other lack of coordination: Secondary | ICD-10-CM | POA: Diagnosis not present

## 2023-10-20 DIAGNOSIS — Z7189 Other specified counseling: Secondary | ICD-10-CM

## 2023-10-20 DIAGNOSIS — D1801 Hemangioma of skin and subcutaneous tissue: Secondary | ICD-10-CM

## 2023-10-20 DIAGNOSIS — Z96651 Presence of right artificial knee joint: Secondary | ICD-10-CM | POA: Diagnosis not present

## 2023-10-20 MED ORDER — HYDROCORTISONE 2.5 % EX CREA: TOPICAL_CREAM | CUTANEOUS | 11 refills | Status: AC

## 2023-10-20 MED ORDER — KETOCONAZOLE 2 % EX SHAM: 1.0000 | MEDICATED_SHAMPOO | CUTANEOUS | 11 refills | Status: AC

## 2023-10-20 MED ORDER — MOMETASONE FUROATE 0.1 % EX SOLN: Freq: Every day | CUTANEOUS | 5 refills | Status: DC | PRN

## 2023-10-20 MED ORDER — KETOCONAZOLE 2 % EX CREA: TOPICAL_CREAM | CUTANEOUS | 11 refills | Status: DC

## 2023-10-20 NOTE — Progress Notes (Signed)
 Follow-Up Visit   Subjective  Jeremy [name] is a 88 y.o. male who presents for the following: one year follow up on seborrheic dermatitis - pt would like refills on Ketoconazole  2% cream, HC 2.5% lotion, Mometasone  lotion, and Ketoconazole  shampoo. Pt has noticed an irregular skin lesion on the scalp that he would like checked today.  The patient presents for Upper Body Skin Exam (UBSE) for skin cancer screening and mole check.  The patient has spots, moles and lesions to be evaluated, some may be new or changing and the patient has concerns that these could be cancer.   The following portions of the chart were reviewed this encounter and updated as appropriate: medications, allergies, medical history  Review of Systems:  No other skin or systemic complaints except as noted in HPI or Assessment and Plan.  Objective  Well appearing patient in no apparent distress; mood and affect are within normal limits.   A focused examination was performed of the following areas: the face, ears, scalp, neck, arms, hands, back, and legs.   Relevant exam findings are noted in the Assessment and Plan.  L forehead x 1, med to R inf scapula x 1 (2) Erythematous stuck-on, waxy papule or plaque L temple x 2 (2) Erythematous thin papules/macules with gritty scale.   Assessment & Plan   Sine Cancer Screening  HISTORY OF BASAL CELL CARCINOMA OF THE SKIN  - R medical forehead at [place] - S/P MOHS 2023 - No evidence of recurrence today - Recommend regular full body skin exams - Recommend daily broad spectrum sunscreen SPF 30+ to sun-exposed areas, reapply every 2 hours as needed.  - Call if any new or changing lesions are noted between office visits  Seborrheic dermatitis Scalp; face Seborrheic Dermatitis  -  is a chronic persistent rash characterized by pinkness and scaling most commonly of the mid face but also can occur on the scalp (dandruff), ears; mid chest, mid back and groin.  It tends  to be exacerbated by stress and cooler weather.  People who have neurologic disease may experience new onset or exacerbation of existing seborrheic dermatitis.  The condition is not curable but treatable and can be controlled. Chronic and persistent condition with duration or expected duration over one year. Condition is symptomatic / bothersome to patient. Not to goal. Cont Ketoconazole  2% cr qohs aa face Cont HC 2.5% cr qohs aa face Cont Ketoconazole  2% shampoo 2x/wk Cont Mometasone  lotion 2x/wk aa scalp   SEBORRHEIC KERATOSIS - Stuck-on, waxy, tan-brown papules and/or plaques  - Benign-appearing - Discussed benign etiology and prognosis. - Observe - Call for any changes  LENTIGINES Exam: scattered tan macules Due to sun exposure Treatment Plan: Benign-appearing, observe. Recommend daily broad spectrum sunscreen SPF 30+ to sun-exposed areas, reapply every 2 hours as needed.  Call for any changes  MELANOCYTIC NEVI Exam: Tan-brown and/or pink-flesh-colored symmetric macules and papules Treatment Plan: Benign appearing on exam today. Recommend observation. Call clinic for new or changing moles. Recommend daily use of broad spectrum spf 30+ sunscreen to sun-exposed areas.   HEMANGIOMA Exam: red papule(s) Discussed benign nature. Recommend observation. Call for changes  EPIDERMAL INCLUSION CYST / Pilar cyst Exam: Subcutaneous nodule at R post crown 0.3 cm Benign-appearing. Exam most consistent with an epidermal inclusion cyst. Discussed that a cyst is a benign growth that can grow over time and sometimes get irritated or inflamed. Recommend observation if it is not bothersome. Discussed option of surgical excision to remove it  if it is growing, symptomatic, or other changes noted. Please call for new or changing lesions so they can be evaluated. Pt decided to observe for now and consider removal if enlarges in future.  INFLAMED SEBORRHEIC KERATOSIS (2) L forehead x 1, med to R inf  scapula x 1 (2) Symptomatic, irritating, patient would like treated.  Destruction of lesion - L forehead x 1, med to R inf scapula x 1 (2) Complexity: simple   Destruction method: cryotherapy   Informed consent: discussed and consent obtained   Timeout:  patient name, date of birth, surgical site, and procedure verified Lesion destroyed using liquid nitrogen: Yes   Region frozen until ice ball extended beyond lesion: Yes   Outcome: patient tolerated procedure well with no complications   Post-procedure details: wound care instructions given   AK (ACTINIC KERATOSIS) (2) L temple x 2 (2) Actinic keratoses are precancerous spots that appear secondary to cumulative UV radiation exposure/sun exposure over time. They are chronic with expected duration over 1 year. A portion of actinic keratoses will progress to squamous cell carcinoma of the skin. It is not possible to reliably predict which spots will progress to skin cancer and so treatment is recommended to prevent development of skin cancer.  Recommend daily broad spectrum sunscreen SPF 30+ to sun-exposed areas, reapply every 2 hours as needed.  Recommend staying in the shade or wearing long sleeves, sun glasses (UVA+UVB protection) and wide brim hats (4-inch brim around the entire circumference of the hat). Call for new or changing lesions.  Destruction of lesion - L temple x 2 (2) Complexity: simple   Destruction method: cryotherapy   Informed consent: discussed and consent obtained   Timeout:  patient name, date of birth, surgical site, and procedure verified Lesion destroyed using liquid nitrogen: Yes   Region frozen until ice ball extended beyond lesion: Yes   Outcome: patient tolerated procedure well with no complications   Post-procedure details: wound care instructions given   SEBORRHEIC DERMATITIS   Related Medications hydrocortisone  2.5 % cream APPLY TO THE AFFECTED AREA OF FACE AND EARS EVERY OTHER NIGHT ALTERNATING WITH  KETOCONAZOLE  CREAM ACTINIC SKIN DAMAGE   HISTORY OF BASAL CELL CARCINOMA   SEBORRHEIC KERATOSIS   LENTIGO   MELANOCYTIC NEVUS, UNSPECIFIED LOCATION   SKIN CANCER SCREENING   COUNSELING AND COORDINATION OF CARE   MEDICATION MANAGEMENT    Return in about 1 year (around 10/19/2024) for seb derm med refills and sun exposed area recheck.  Arlinda Lais, CMA, am acting as scribe for Celine Collard, MD .   Documentation: I have reviewed the above documentation for accuracy and completeness, and I agree with the above.  Celine Collard, MD

## 2023-10-20 NOTE — Patient Instructions (Signed)

## 2023-10-21 ENCOUNTER — Other Ambulatory Visit: Payer: Self-pay | Admitting: Family Medicine

## 2023-10-21 DIAGNOSIS — N529 Male erectile dysfunction, unspecified: Secondary | ICD-10-CM

## 2023-10-22 DIAGNOSIS — R278 Other lack of coordination: Secondary | ICD-10-CM | POA: Diagnosis not present

## 2023-10-22 DIAGNOSIS — M6281 Muscle weakness (generalized): Secondary | ICD-10-CM | POA: Diagnosis not present

## 2023-10-22 DIAGNOSIS — M7072 Other bursitis of hip, left hip: Secondary | ICD-10-CM | POA: Diagnosis not present

## 2023-10-22 DIAGNOSIS — Z96651 Presence of right artificial knee joint: Secondary | ICD-10-CM | POA: Diagnosis not present

## 2023-10-22 DIAGNOSIS — R2689 Other abnormalities of gait and mobility: Secondary | ICD-10-CM | POA: Diagnosis not present

## 2023-10-26 ENCOUNTER — Other Ambulatory Visit: Payer: Self-pay | Admitting: Family Medicine

## 2023-10-26 ENCOUNTER — Encounter: Payer: Self-pay | Admitting: Family Medicine

## 2023-10-26 ENCOUNTER — Ambulatory Visit: Admitting: Family Medicine

## 2023-10-26 VITALS — BP 130/80 | HR 97 | Resp 16 | Ht 75.5 in | Wt 203.0 lb

## 2023-10-26 DIAGNOSIS — I152 Hypertension secondary to endocrine disorders: Secondary | ICD-10-CM | POA: Diagnosis not present

## 2023-10-26 DIAGNOSIS — E1159 Type 2 diabetes mellitus with other circulatory complications: Secondary | ICD-10-CM | POA: Diagnosis not present

## 2023-10-26 DIAGNOSIS — I10 Essential (primary) hypertension: Secondary | ICD-10-CM

## 2023-10-26 DIAGNOSIS — E1169 Type 2 diabetes mellitus with other specified complication: Secondary | ICD-10-CM

## 2023-10-26 DIAGNOSIS — E785 Hyperlipidemia, unspecified: Secondary | ICD-10-CM | POA: Diagnosis not present

## 2023-10-26 DIAGNOSIS — S81802D Unspecified open wound, left lower leg, subsequent encounter: Secondary | ICD-10-CM | POA: Diagnosis not present

## 2023-10-26 MED ORDER — METRONIDAZOLE 500 MG PO TABS
500.0000 mg | ORAL_TABLET | Freq: Two times a day (BID) | ORAL | 0 refills | Status: AC
Start: 2023-10-26 — End: 2023-11-02

## 2023-10-26 MED ORDER — CIPROFLOXACIN HCL 250 MG PO TABS: 250.0000 mg | ORAL_TABLET | Freq: Two times a day (BID) | ORAL | 0 refills | Status: AC

## 2023-10-26 MED ORDER — TELMISARTAN-HCTZ 80-12.5 MG PO TABS: 1.0000 | ORAL_TABLET | Freq: Every day | ORAL | 1 refills | Status: DC

## 2023-10-26 NOTE — Progress Notes (Signed)
 Name: Jeremy [name]   MRN: [id number]    DOB: [birth year]   Date:10/26/2023       Progress Note  Subjective  Chief Complaint  Chief Complaint  Patient presents with   Wound Check    Discussed the use of AI scribe software for clinical note transcription with the patient, who gave verbal consent to proceed.  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  He has had a leg wound for over two and a half weeks, which remains swollen and oozing. Initially, he treated it with calamine lotion and weekly wrapping, a method that previously took months to heal similar wounds. Despite the concerning appearance, there is no pain associated with the wound. No fever, chills, or systemic symptoms are present.  His diabetes complicates the healing process of the wound. He is currently taking telmisartan  80 mg, a diuretic, and Eliquis. He takes glipizide 10 mg every morning and insulin, noting that his blood sugar has been running low. He usually picks up his medications from [place].  He has a known allergy to penicillin, which limits his antibiotic options. He has experienced muscle bleeding with Cipro in the past, which he associates with tendinopathy.    Patient Active Problem List   Diagnosis Date Noted   Anticoagulant long-term use 06/03/2023   Family history of Lynch syndrome 06/03/2023   Insomnia 03/11/2023   Anemia 03/11/2023   Hypertension, benign 03/11/2023   Type 2 diabetes mellitus with microalbuminuria, with long-term current use of insulin (HCC) 03/11/2023   History of total knee replacement 03/11/2023   Persistent atrial fibrillation (HCC) 02/16/2023   Thrombocytopenia (HCC) 06/03/2022   B12 deficiency 06/03/2022   Basal cell carcinoma (BCC) of skin of face 11/03/2021   Dyslipidemia associated with type 2 diabetes mellitus (HCC) 11/03/2021   Impingement syndrome of shoulder region 11/16/2018    Atherosclerosis of aorta (HCC) 07/21/2018   History of diverticulitis of colon 11/15/2017   Hyperlipidemia due to type 2 diabetes mellitus (HCC) 03/14/2017   Bilateral lower extremity edema 03/27/2016   Vitamin D  deficiency 12/05/2015   Hx of transient ischemic attack (TIA) 09/17/2015   Diabetes mellitus type 2 without retinopathy (HCC) 05/31/2015   Hyperlipidemia 05/30/2015   Type 2 diabetes mellitus with renal manifestations (HCC) 01/08/2015   Incisional hernia, without obstruction or gangrene 03/23/2013   Enlarged prostate with lower urinary tract symptoms (LUTS) 11/03/2012   Myopic degeneration, bilateral 08/12/2012   Nonexudative age-related macular degeneration 07/15/2012   Anisometropia 04/08/2012   Pseudophakia 04/08/2012   History of retinal detachment 04/08/2012   Presence of intraocular lens 04/08/2012   Secondary open-angle glaucoma of left eye, severe stage 01/13/2012   Benign essential HTN 02/03/2007   Hypertension associated with diabetes (HCC) 02/03/2007    Social History   Tobacco Use   Smoking status: Never   Smokeless tobacco: Never   Tobacco comments:    smoking cessation materials not required  Substance Use Topics   Alcohol use: No     Current Outpatient Medications:    acetaminophen  (TYLENOL ) 500 MG tablet, Take 1,000 mg by mouth 2 (two) times daily as needed for moderate pain (pain score 4-6)., Disp: , Rfl:    apixaban (ELIQUIS) 5 MG TABS tablet, Take 5 mg by mouth 2 (two) times daily., Disp: , Rfl:    brimonidine-timolol (COMBIGAN) 0.2-0.5 % ophthalmic solution, Place 1 drop into the left eye 2 (two) times  daily., Disp: , Rfl:    diclofenac  Sodium (VOLTAREN ) 1 % GEL, Apply 4 g topically 4 (four) times daily., Disp: 300 g, Rfl: 1   diltiazem (CARDIZEM CD) 120 MG 24 hr capsule, Take 120 mg by mouth daily., Disp: , Rfl:    dorzolamide (TRUSOPT) 2 % ophthalmic solution, Place 1 drop into the left eye 2 (two) times daily., Disp: , Rfl: 3   empagliflozin  (JARDIANCE) 25 MG TABS tablet, Take 25 mg by mouth daily., Disp: , Rfl:    Exenatide  ER (BYDUREON  BCISE) 2 MG/0.85ML AUIJ, Inject 2 mg into the skin every Sunday., Disp: , Rfl:    finasteride  (PROSCAR ) 5 MG tablet, Take 1 tablet by mouth once daily, Disp: 90 tablet, Rfl: 1   flecainide (TAMBOCOR) 50 MG tablet, Take 100 mg by mouth 2 (two) times daily., Disp: , Rfl:    furosemide  (LASIX ) 20 MG tablet, Take 1 tablet (20 mg total) by mouth daily., Disp: 6 tablet, Rfl: 0   glipiZIDE (GLUCOTROL) 10 MG tablet, Take 10 mg by mouth daily., Disp: , Rfl:    hydrocortisone  2.5 % cream, APPLY TO THE AFFECTED AREA OF FACE AND EARS EVERY OTHER NIGHT ALTERNATING WITH KETOCONAZOLE  CREAM, Disp: 28 g, Rfl: 11   insulin degludec (TRESIBA FLEXTOUCH) 200 UNIT/ML FlexTouch Pen, Inject 16 Units into the skin in the morning., Disp: , Rfl:    ketoconazole  (NIZORAL ) 2 % cream, Apply two grams to the face every other day alternating with hydrocortisone  2.5% cream every other day., Disp: 60 g, Rfl: 11   ketoconazole  (NIZORAL ) 2 % shampoo, Apply 1 Application topically once a week. [place], Disp: 120 mL, Rfl: 11   loperamide (IMODIUM A-D) 2 MG tablet, Take 2 mg by mouth as needed for diarrhea or loose stools., Disp: , Rfl:    loratadine (CLARITIN) 10 MG tablet, Take 10 mg by mouth daily., Disp: , Rfl:    metFORMIN  (GLUCOPHAGE ) 1000 MG tablet, Take 1,000 mg by mouth 2 (two) times daily with a meal. On Monday and Wednesday, Disp: , Rfl:    mometasone  (ELOCON ) 0.1 % lotion, Apply topically daily as needed (itchy scalp). Apply to itchy areas of scalp QD- BID PRN, Disp: 60 mL, Rfl: 5   Multiple Vitamin (MULTIVITAMIN) tablet, Take 1 tablet by mouth daily., Disp: , Rfl:    omega-3 acid ethyl esters (LOVAZA ) 1 g capsule, Take 2 capsules (2 g total) by mouth 2 (two) times daily., Disp: 480 capsule, Rfl: 3   omeprazole  (PRILOSEC) 20 MG capsule, Take 1 capsule (20 mg total) by mouth daily., Disp: 90 capsule, Rfl: 1   rosuvastatin  (CRESTOR )  10 MG tablet, Take 1 tablet (10 mg total) by mouth at bedtime., Disp: 90 tablet, Rfl: 1   tadalafil  (CIALIS ) 20 MG tablet, Take 1 tablet (20 mg total) by mouth daily as needed for erectile dysfunction., Disp: 90 tablet, Rfl: 0   telmisartan -hydrochlorothiazide  (MICARDIS  HCT) 80-12.5 MG tablet, Take 1 tablet by mouth daily., Disp: 90 tablet, Rfl: 1  Allergies  Allergen Reactions   Demerol [Meperidine] Other (See Comments) and Anaphylaxis    MAKES BLOOD PRESSURE BOTTOM OUT hypotension   Metoprolol Shortness Of Breath   Dilaudid [Hydromorphone Hcl]     MAKES BLOOD PRESSURE BOTTOM OUT   Actos [Pioglitazone] Other (See Comments) and Hives   Dapagliflozin Rash   Penicillins Rash    ROS  Ten systems reviewed and is negative except as mentioned in HPI    Objective  Vitals:   10/26/23 1340  BP: 130/80  Pulse: 97  Resp: 16  SpO2: 93%  Weight: 203 lb (92.1 kg)  Height: 6' 3.5" (1.918 m)    Body mass index is 25.04 kg/m.  Physical Exam  Constitutional: Patient appears well-developed Obese  No distress.  HEENT: head atraumatic, normocephalic, pupils equal and reactive to light Cardiovascular: Normal rate, regular rhythm and normal heart sounds.  No murmur heard. Edema of right lower extremity  Pulmonary/Chest: Effort normal and breath sounds normal. No respiratory distress. Abdominal: Soft.  There is no tenderness. Lower extremity; foul odor, friable with some granulation but also some oozing and pus on dressing, dressing with dark material/necrosis? Psychiatric: Patient has a normal mood and affect. behavior is normal. Judgment and thought content normal.    Assessment & Plan Infected leg wound on a patient with diabetes  Chronic leg wound with infection, possible osteomyelitis, and necrotic tissue. Suspected pseudomonas infection. - Refer to surgeon ASAP  for evaluation and possible debridement. - Hold Eliquis tonight and tomorrow morning to prevent excessive bleeding during  debridement. - Prescribe Cipro and Flagyl for 7 days for pseudomonas coverage. - Change wound dressing daily and keep wound open for evaluation. - Schedule debridement with Dr. Dana Duncan at 2 PM tomorrow.  Diabetes mellitus Diabetes complicates wound healing and increases infection risk. Blood sugar levels low, potential interaction between Cipro and glipizide. - Monitor blood sugar levels closely during Cipro treatment. - Reduce glipizide dose to 5 mg daily to prevent hypoglycemia. - Ensure adequate hydration.  Anticoagulation management Eliquis held for debridement to prevent bleeding. Resumption based on surgeon's advice. - Hold Eliquis tonight and tomorrow morning before debridement. - Resume Eliquis post-procedure as advised by surgeon.  Hypertension Hypertension managed with telmisartan . - Send prescription for telmisartan  80 mg/12.5 mg to [facility].

## 2023-10-28 ENCOUNTER — Ambulatory Visit: Payer: Self-pay

## 2023-10-28 ENCOUNTER — Ambulatory Visit: Admitting: Nurse Practitioner

## 2023-10-28 ENCOUNTER — Encounter: Payer: Self-pay | Admitting: Nurse Practitioner

## 2023-10-28 VITALS — BP 130/68 | HR 87 | Temp 98.0°F | Ht 75.5 in

## 2023-10-28 DIAGNOSIS — S81802D Unspecified open wound, left lower leg, subsequent encounter: Secondary | ICD-10-CM | POA: Diagnosis not present

## 2023-10-28 NOTE — Telephone Encounter (Signed)
 No triage: Pt called to find out when to stop Eliquis. Pt thought procedure was 5/14 but it is 5/19. Pt restarted Eliquis but wants to know when to stop it. Please advise.        Copied from CRM [id number]. Topic: Clinical - Pink Word Triage >> Oct 28, 2023  9:30 AM El Gravely T wrote: Reason for Triage: Eliquis Patient has questions regarding medication Eliquis, and how he should be taking it. Reason for Disposition  Health Information question, no triage required and triager able to answer question  Answer Assessment - Initial Assessment Questions 1. REASON FOR CALL or QUESTION: "What is your reason for calling today?" or "How can I best help you?" or "What question do you have that I can help answer?"     Procedure is set for 5/19 and pt restarted Eliquis today. Pt wants to know when to stop it.  Protocols used: Information Only Call - No Triage-A-AH

## 2023-10-28 NOTE — Progress Notes (Signed)
 BP 130/68   Pulse 87   Temp 98 F (36.7 C)   Ht 6' 3.5" (1.918 m)   SpO2 96%   BMI 25.04 kg/m    Subjective:    Patient ID: Jeremy [name], male    DOB: [birth year], 88 y.o.   MRN: [id number]  HPI: DARYEN TEANEY is a 88 y.o. male  Chief Complaint  Patient presents with   Wound Check    Left leg    Discussed the use of AI scribe software for clinical note transcription with the patient, who gave verbal consent to proceed.  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  He was seen by Dr. Ava Lei on 10/26/2023, started on antibiotics and referred to surgery.  Note for 10/26/2023 visit: He has had a leg wound for over two and a half weeks, which remains swollen and oozing. Initially, he treated it with calamine lotion and weekly wrapping, a method that previously took months to heal similar wounds. Despite the concerning appearance, there is no pain associated with the wound. No fever, chills, or systemic symptoms are present.   He is currently on a seven-day course of Ciprofloxacin and Metronidazole, which he has been taking as prescribed. The course will be completed by Monday. He notes some improvement in the wound, stating it is 'not oozing as much as it was,' but still observes slight oozing from the bottom of the wound.  He was initially scheduled for a surgical consultation yesterday, but the appointment was rescheduled to Monday. He has a history of a lesion on the left side of his ankle about fifteen years ago, which was treated with calamine lotion-treated gauze and healed after two months. He mentions having thin skin over the shin and recalls an incident during physical therapy for a right knee replacement where using a pillow caused a wound on the leg.  No pain when pressure is applied to the wound and no difficulty walking.          10/19/2023    1:32 PM  06/21/2023    2:05 PM 05/10/2023    1:11 PM  Depression screen PHQ 2/9  Decreased Interest 0 0 0  Down, Depressed, Hopeless 0 0 0  PHQ - 2 Score 0 0 0  Altered sleeping  0 0  Tired, decreased energy  0 0  Change in appetite  0 0  Feeling bad or failure about yourself   0 0  Trouble concentrating  0 0  Moving slowly or fidgety/restless  0 0  Suicidal thoughts  0 0  PHQ-9 Score  0 0  Difficult doing work/chores  Not difficult at all     Relevant past medical, surgical, family and social history reviewed and updated as indicated. Interim medical history since our last visit reviewed. Allergies and medications reviewed and updated.  Review of Systems  Ten systems reviewed and is negative except as mentioned in HPI      Objective:      BP 130/68   Pulse 87   Temp 98 F (36.7 C)   Ht 6' 3.5" (1.918 m)   SpO2 96%   BMI 25.04 kg/m    Wt Readings from Last 3 Encounters:  10/26/23 203 lb (92.1 kg)  10/19/23 201 lb 8 oz (91.4 kg)  10/14/23 202 lb (91.6 kg)    Physical Exam Vitals reviewed.  Constitutional:      Appearance: Normal appearance.  HENT:     Head: Normocephalic.  Cardiovascular:     Rate and Rhythm: Normal rate.  Pulmonary:     Effort: Pulmonary effort is normal.  Skin:    Comments: Left lower leg wound, picture in chart  Neurological:     General: No focal deficit present.     Mental Status: He is alert and oriented to person, place, and time. Mental status is at baseline.  Psychiatric:        Mood and Affect: Mood normal.        Behavior: Behavior normal.        Thought Content: Thought content normal.        Judgment: Judgment normal.    Physical Exam    Results for orders placed or performed in visit on 05/10/23  Lipid panel   Collection Time: 06/21/23  2:27 PM  Result Value Ref Range   Cholesterol 89 <200 mg/dL   HDL 33 (L) > OR = 40 mg/dL   Triglycerides 161 <096 mg/dL   LDL Cholesterol (Calc) 36 mg/dL (calc)   Total CHOL/HDL Ratio 2.7  <5.0 (calc)   Non-HDL Cholesterol (Calc) 56 <045 mg/dL (calc)  Microalbumin / creatinine urine ratio   Collection Time: 06/21/23  2:27 PM  Result Value Ref Range   Creatinine, Urine 44 20 - 320 mg/dL   Microalb, Ur 9.3 mg/dL   Microalb Creat Ratio 211 (H) <30 mg/g creat  COMPLETE METABOLIC PANEL WITH GFR   Collection Time: 06/21/23  2:27 PM  Result Value Ref Range   Glucose, Bld 119 (H) 65 - 99 mg/dL   BUN 22 7 - 25 mg/dL   Creat 4.09 8.11 - 9.14 mg/dL   eGFR 85 > OR = 60 NW/GNF/6.21H0   BUN/Creatinine Ratio SEE NOTE: 6 - 22 (calc)   Sodium 141 135 - 146 mmol/L   Potassium 4.2 3.5 - 5.3 mmol/L   Chloride 103 98 - 110 mmol/L   CO2 28 20 - 32 mmol/L   Calcium  9.0 8.6 - 10.3 mg/dL   Total Protein 6.9 6.1 - 8.1 g/dL   Albumin 4.5 3.6 - 5.1 g/dL   Globulin 2.4 1.9 - 3.7 g/dL (calc)   AG Ratio 1.9 1.0 - 2.5 (calc)   Total Bilirubin 0.6 0.2 - 1.2 mg/dL   Alkaline phosphatase (APISO) 50 35 - 144 U/L   AST 14 10 - 35 U/L   ALT 8 (L) 9 - 46 U/L  CBC with Differential/Platelet   Collection Time: 06/21/23  2:27 PM  Result Value Ref Range   WBC 3.6 (L) 3.8 - 10.8 Thousand/uL   RBC 4.09 (L) 4.20 - 5.80 Million/uL   Hemoglobin 13.5 13.2 - 17.1 g/dL   HCT 86.5 78.4 - 69.6 %   MCV 99.3 80.0 - 100.0 fL   MCH 33.0 27.0 - 33.0 pg   MCHC 33.3 32.0 - 36.0 g/dL   RDW 29.5 28.4 - 13.2 %   Platelets 101 (L) 140 - 400 Thousand/uL   MPV 11.6 7.5 - 12.5 fL   Neutro Abs 1,919 1,500 - 7,800 cells/uL   Absolute Lymphocytes 994 850 - 3,900 cells/uL   Absolute Monocytes 666 200 - 950 cells/uL   Eosinophils Absolute 11 (L) 15 - 500 cells/uL   Basophils Absolute 11 0 - 200 cells/uL   Neutrophils Relative % 53.3 %   Total Lymphocyte 27.6 %   Monocytes Relative 18.5 %   Eosinophils Relative  0.3 %   Basophils Relative 0.3 %   Smear Review            Assessment & Plan:   Problem List Items Addressed This Visit   None Visit Diagnoses       Open wound of left lower leg, subsequent encounter     -  Primary   Relevant Orders   Wound culture        Assessment and Plan Assessment & Plan wound on left leg wound on the left leg with poor healing, currently on Cipro and Flagyl. Slight improvement noted with reduced oozing. No pain or difficulty ambulating. - Continue Cipro and Flagyl until completion on Monday. - Perform wound culture to confirm appropriate antibiotic coverage. - Apply new dressing to the wound. - Ensure wound is dressed appropriately for surgical appointment on Monday.        Follow up plan: Return if symptoms worsen or fail to improve.

## 2023-10-28 NOTE — Telephone Encounter (Addendum)
 Summary: Eliquis   Copied From CRM [id number]. Reason for Triage: Eliquis Patient has questions regarding medication Eliquis, and how he should be taking it.    First attempt; left vm. Second attempt; no answer  Wants call back from the office.  States sees Careers adviser on Monday and wants to know when he should start taking the eliquis again.

## 2023-10-29 DIAGNOSIS — Z96651 Presence of right artificial knee joint: Secondary | ICD-10-CM | POA: Diagnosis not present

## 2023-10-29 DIAGNOSIS — R278 Other lack of coordination: Secondary | ICD-10-CM | POA: Diagnosis not present

## 2023-10-29 DIAGNOSIS — M6281 Muscle weakness (generalized): Secondary | ICD-10-CM | POA: Diagnosis not present

## 2023-10-29 DIAGNOSIS — M7072 Other bursitis of hip, left hip: Secondary | ICD-10-CM | POA: Diagnosis not present

## 2023-10-29 DIAGNOSIS — R2689 Other abnormalities of gait and mobility: Secondary | ICD-10-CM | POA: Diagnosis not present

## 2023-10-31 LAB — WOUND CULTURE
MICRO NUMBER:: 16463888
SPECIMEN QUALITY:: ADEQUATE

## 2023-11-01 ENCOUNTER — Encounter: Payer: Self-pay | Admitting: Surgery

## 2023-11-01 ENCOUNTER — Ambulatory Visit: Admitting: Surgery

## 2023-11-01 VITALS — BP 137/75 | HR 91 | Temp 98.4°F | Ht 75.5 in | Wt 202.4 lb

## 2023-11-01 DIAGNOSIS — I890 Lymphedema, not elsewhere classified: Secondary | ICD-10-CM | POA: Diagnosis not present

## 2023-11-01 DIAGNOSIS — I878 Other specified disorders of veins: Secondary | ICD-10-CM | POA: Diagnosis not present

## 2023-11-01 NOTE — Progress Notes (Signed)
 Patient ID: Jeremy [name], male   DOB: Oct 15, 1935, 88 y.o.   MRN: [id number]  HPI Jeremy [name] is a 88 y.o. male in consultation at the request of Dr. Ava [name].  He reports a left lower extremity ulceration for last few weeks.  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 HE does have uncontrolled diabetes. HE reports some edema on Left leg along with some mild pain, intermittent and dull. HE did have an unna boot at some point in time . Prior surgical abdominal hx includes prior laparotomy w Left colectomy and laparotomy for LOA and hernia repair several years ago by Dr. Marquita [name] and Dr. Aram [name]. He still drives and serves as Science writer. HE was placed on antibiotic for leg ulcer, denies any fevers or chills He knows Dr. Vonna [name] as he has done some vascular work on his late wife HE did have a U/S that I have pers reviewed nml liver and GB HPI  Past Medical History:  Diagnosis Date   Allergy    Basal cell carcinoma 10/09/2021   right medial forehead at glabella, Mohs 01/22/22   Chronic kidney disease    Diabetes mellitus without complication (HCC)    Diabetic neuropathy (HCC)    Diverticula, colon 1980's   Diverticulosis    Enlarged prostate    GERD (gastroesophageal reflux disease)    Glaucoma    Hemorrhoids    High cholesterol    History of hiatal hernia    Hyperlipidemia    Hypertension    Obesity    Onychomycosis    Seizures (HCC)    Small bowel obstruction (HCC) 1990's   Urination disorder     Past Surgical History:  Procedure Laterality Date   APPENDECTOMY     BLADDER DIVERTICULECTOMY  2013   CARDIOVERSION N/A 02/24/2023   Procedure: CARDIOVERSION;  Surgeon: Jeremy Brace, MD;  Location: [facility] ORS;  Service: Cardiovascular;  Laterality: N/A;   CARDIOVERSION N/A 04/21/2023   Procedure: CARDIOVERSION;  Surgeon: Jeremy Brace, MD;  Location: [facility];  Service: Cardiovascular;  Laterality: N/A;   CARPAL TUNNEL RELEASE  01/2022   revision right side    COLON SURGERY Left 1980's   COLONOSCOPY W/ POLYPECTOMY  1990's   Dr Jeremy [name]   COLONOSCOPY WITH PROPOFOL  N/A 12/21/2014   Procedure: COLONOSCOPY WITH PROPOFOL ;  Surgeon: Jeremy Fly, MD;  Location: [facility] ENDOSCOPY;  Service: Endoscopy;  Laterality: N/A;   COLONOSCOPY WITH PROPOFOL  N/A 08/16/2017   Procedure: COLONOSCOPY WITH PROPOFOL ;  Surgeon: Jeremy Fly, MD;  Location: [facility] ENDOSCOPY;  Service: Endoscopy;  Laterality: N/A;   COLONOSCOPY WITH PROPOFOL  N/A 01/31/2021   Procedure: COLONOSCOPY WITH PROPOFOL ;  Surgeon: Jeremy Skeeter, MD;  Location: [facility] ENDOSCOPY;  Service: Endoscopy;  Laterality: N/A;   ESOPHAGOGASTRODUODENOSCOPY (EGD) WITH PROPOFOL  N/A 02/15/2015   Procedure: ESOPHAGOGASTRODUODENOSCOPY (EGD) WITH PROPOFOL ;  Surgeon: Jeremy Fly, MD;  Location: [facility] ENDOSCOPY;  Service: Endoscopy;  Laterality: N/A;   ESOPHAGOGASTRODUODENOSCOPY (EGD) WITH PROPOFOL  N/A 06/10/2017   Procedure: ESOPHAGOGASTRODUODENOSCOPY (EGD) WITH PROPOFOL ;  Surgeon: Jeremy Fly, MD;  Location: [facility] ENDOSCOPY;  Service: Endoscopy;  Laterality: N/A;   ESOPHAGOGASTRODUODENOSCOPY (EGD) WITH PROPOFOL  N/A 08/16/2017   Procedure: ESOPHAGOGASTRODUODENOSCOPY (EGD) WITH PROPOFOL ;  Surgeon: Jeremy Fly, MD;  Location: [facility] ENDOSCOPY;  Service: Endoscopy;  Laterality: N/A;   ESOPHAGOGASTRODUODENOSCOPY (EGD) WITH PROPOFOL  N/A 01/31/2021   Procedure: ESOPHAGOGASTRODUODENOSCOPY (EGD) WITH PROPOFOL ;  Surgeon: Jeremy Skeeter, MD;  Location: [facility] ENDOSCOPY;  Service: Endoscopy;  Laterality: N/A;   EYE SURGERY  GLAUCOMA SURGERY Left 1990's   [facility]   HEMORROIDECTOMY  1980's   [facility]   HERNIA REPAIR     [facility], Dr Jeremy [name] HERNIA REPAIR Bilateral [phone]   LARYNX SURGERY  1990   3 times   MOHS SURGERY  01/22/2022   Dr. Bartholomew [name] - [facility]   REPLACEMENT TOTAL KNEE Right    RETINAL DETACHMENT SURGERY Right    TONSILLECTOMY     turp  2013   URETER SURGERY   2013   Dr Jeremy [name]    Family History  Problem Relation Age of Onset   Diabetes Mother    Cataracts Mother    Metabolic syndrome Mother    Blindness Mother        r/t diabetes   Cataracts Father    Glaucoma Father        Retina-Detachment   Atrial fibrillation Father    Diverticulitis Sister    Glaucoma Brother    Diabetes Brother    Cancer Sister        Gallbladder   Cancer Daughter     Social History Social History   Tobacco Use   Smoking status: Never   Smokeless tobacco: Never   Tobacco comments:    smoking cessation materials not required  Vaping Use   Vaping status: Never Used  Substance Use Topics   Alcohol use: No   Drug use: No    Allergies  Allergen Reactions   Demerol [Meperidine] Other (See Comments) and Anaphylaxis    MAKES BLOOD PRESSURE BOTTOM OUT hypotension   Metoprolol Shortness Of Breath   Dilaudid [Hydromorphone Hcl]     MAKES BLOOD PRESSURE BOTTOM OUT   Actos [Pioglitazone] Other (See Comments) and Hives   Dapagliflozin Rash   Penicillins Rash    Current Outpatient Medications  Medication Sig Dispense Refill   acetaminophen  (TYLENOL ) 500 MG tablet Take 1,000 mg by mouth 2 (two) times daily as needed for moderate pain (pain score 4-6).     apixaban (ELIQUIS) 5 MG TABS tablet Take 5 mg by mouth 2 (two) times daily.     brimonidine-timolol (COMBIGAN) 0.2-0.5 % ophthalmic solution Place 1 drop into the left eye 2 (two) times daily.     ciprofloxacin  (CIPRO ) 250 MG tablet Take 1 tablet (250 mg total) by mouth 2 (two) times daily for 7 days. 14 tablet 0   diclofenac  Sodium (VOLTAREN ) 1 % GEL Apply 4 g topically 4 (four) times daily. 300 g 1   diltiazem (CARDIZEM CD) 120 MG 24 hr capsule Take 120 mg by mouth daily.     dorzolamide (TRUSOPT) 2 % ophthalmic solution Place 1 drop into the left eye 2 (two) times daily.  3   empagliflozin (JARDIANCE) 25 MG TABS tablet Take 25 mg by mouth daily.     Exenatide  ER (BYDUREON  BCISE) 2 MG/0.85ML AUIJ Inject  2 mg into the skin every Sunday.     finasteride  (PROSCAR ) 5 MG tablet Take 1 tablet by mouth once daily 90 tablet 1   flecainide (TAMBOCOR) 50 MG tablet Take 100 mg by mouth 2 (two) times daily.     furosemide  (LASIX ) 20 MG tablet Take 1 tablet (20 mg total) by mouth daily. 6 tablet 0   glipiZIDE (GLUCOTROL) 10 MG tablet Take 10 mg by mouth daily.     hydrocortisone  2.5 % cream APPLY TO THE AFFECTED AREA OF FACE AND EARS EVERY OTHER NIGHT ALTERNATING WITH KETOCONAZOLE  CREAM 28 g 11   insulin degludec (TRESIBA  FLEXTOUCH) 200 UNIT/ML FlexTouch Pen Inject 16 Units into the skin in the morning.     ketoconazole  (NIZORAL ) 2 % cream Apply two grams to the face every other day alternating with hydrocortisone  2.5% cream every other day. 60 g 11   ketoconazole  (NIZORAL ) 2 % shampoo Apply 1 Application topically once a week. Massea 120 mL 11   loperamide (IMODIUM A-D) 2 MG tablet Take 2 mg by mouth as needed for diarrhea or loose stools.     loratadine (CLARITIN) 10 MG tablet Take 10 mg by mouth daily.     metFORMIN  (GLUCOPHAGE ) 1000 MG tablet Take 1,000 mg by mouth 2 (two) times daily with a meal. On Monday and Wednesday     metroNIDAZOLE  (FLAGYL ) 500 MG tablet Take 1 tablet (500 mg total) by mouth 2 (two) times daily for 7 days. 14 tablet 0   mometasone  (ELOCON ) 0.1 % lotion Apply topically daily as needed (itchy scalp). Apply to itchy areas of scalp QD- BID PRN 60 mL 5   Multiple Vitamin (MULTIVITAMIN) tablet Take 1 tablet by mouth daily.     omega-3 acid ethyl esters (LOVAZA ) 1 g capsule Take 2 capsules (2 g total) by mouth 2 (two) times daily. 480 capsule 3   omeprazole  (PRILOSEC) 20 MG capsule Take 1 capsule (20 mg total) by mouth daily. 90 capsule 1   rosuvastatin  (CRESTOR ) 10 MG tablet Take 1 tablet (10 mg total) by mouth at bedtime. 90 tablet 1   tadalafil  (CIALIS ) 20 MG tablet Take 1 tablet (20 mg total) by mouth daily as needed for erectile dysfunction. 90 tablet 0    telmisartan -hydrochlorothiazide  (MICARDIS  HCT) 80-12.5 MG tablet Take 1 tablet by mouth daily. 90 tablet 1   No current facility-administered medications for this visit.     Review of Systems Full ROS  was asked and was negative except for the information on the HPI  Physical Exam Blood pressure 137/75, pulse 91, temperature 98.4 F (36.9 C), temperature source Oral, height 6' 3.5" (1.918 m), weight 202 lb 6.4 oz (91.8 kg), SpO2 94%. CONSTITUTIONAL: NAD. EYES: Pupils are equal, round, a Sclera are non-icteric. EARS, NOSE, MOUTH AND THROAT: The oropharynx is clear. The oral mucosa is pink and moist. Hearing is intact to voice. LYMPH NODES:  Lymph nodes in the neck are normal. RESPIRATORY:  Lungs are clear. There is normal respiratory effort, with equal breath sounds bilaterally, and without pathologic use of accessory muscles. CARDIOVASCULAR: Heart is regular without murmurs, gallops, or rubs. GI: The abdomen is  soft, nontender, and nondistended. There are no palpable masses. There is no hepatosplenomegaly. There are normal bowel sounds in all quadrants. Prior laparotomy scar GU: Rectal deferred.   MUSCULOSKELETAL: Normal muscle strength and tone.  VASCULAR: Bilateral bounding femoral, radial pulses Bilateral bounding Pedal pulses EXTREMITIES: left Leg edema up to the knee, epidermolysis with superficial ulceration anterior aspect measuring 13 x 4.5 cm, no abscess, no evidence of necrotizing infection. Old dressing removed and replaced with telfa and with compression coband   SKIN: Turgor is good and there are no pathologic skin lesions or ulcers. NEUROLOGIC: Motor and sensation is grossly normal. Cranial nerves are grossly intact. PSYCH:  Oriented to person, place and time. Affect is normal.  Data Reviewed  I have personally reviewed the patient's imaging, laboratory findings and medical records.    Assessment/Plan 88 yo left leg lymphedema and chronic venous stasis ulceration and  skin issues. No need for debridement. We will sent to vascular for further venous  w/u and to assess venous system. I do suspect he will benefit from unna boot. May elevate leg and resume anticoagulation.  I spent 45 minutes in this encounter including extensive review of medical records, personally reviewing imaging this studies, coordinating her care, placing orders and performing documentation  Copy of this report was sent to referring provider.    Evelia Hipp, MD FACS General Surgeon 11/01/2023, 2:47 PM

## 2023-11-01 NOTE — Patient Instructions (Signed)

## 2023-11-02 DIAGNOSIS — H40002 Preglaucoma, unspecified, left eye: Secondary | ICD-10-CM | POA: Diagnosis not present

## 2023-11-03 DIAGNOSIS — R278 Other lack of coordination: Secondary | ICD-10-CM | POA: Diagnosis not present

## 2023-11-03 DIAGNOSIS — M6281 Muscle weakness (generalized): Secondary | ICD-10-CM | POA: Diagnosis not present

## 2023-11-03 DIAGNOSIS — R2689 Other abnormalities of gait and mobility: Secondary | ICD-10-CM | POA: Diagnosis not present

## 2023-11-03 DIAGNOSIS — Z96651 Presence of right artificial knee joint: Secondary | ICD-10-CM | POA: Diagnosis not present

## 2023-11-03 DIAGNOSIS — M7072 Other bursitis of hip, left hip: Secondary | ICD-10-CM | POA: Diagnosis not present

## 2023-11-04 ENCOUNTER — Other Ambulatory Visit: Payer: Self-pay | Admitting: Family Medicine

## 2023-11-04 DIAGNOSIS — H2702 Aphakia, left eye: Secondary | ICD-10-CM | POA: Diagnosis not present

## 2023-11-04 DIAGNOSIS — M3501 Sicca syndrome with keratoconjunctivitis: Secondary | ICD-10-CM | POA: Diagnosis not present

## 2023-11-04 DIAGNOSIS — H401122 Primary open-angle glaucoma, left eye, moderate stage: Secondary | ICD-10-CM | POA: Diagnosis not present

## 2023-11-04 DIAGNOSIS — E11319 Type 2 diabetes mellitus with unspecified diabetic retinopathy without macular edema: Secondary | ICD-10-CM | POA: Diagnosis not present

## 2023-11-04 LAB — HM DIABETES EYE EXAM

## 2023-11-05 DIAGNOSIS — R2689 Other abnormalities of gait and mobility: Secondary | ICD-10-CM | POA: Diagnosis not present

## 2023-11-05 DIAGNOSIS — Z96651 Presence of right artificial knee joint: Secondary | ICD-10-CM | POA: Diagnosis not present

## 2023-11-05 DIAGNOSIS — R278 Other lack of coordination: Secondary | ICD-10-CM | POA: Diagnosis not present

## 2023-11-05 DIAGNOSIS — M6281 Muscle weakness (generalized): Secondary | ICD-10-CM | POA: Diagnosis not present

## 2023-11-05 DIAGNOSIS — M7072 Other bursitis of hip, left hip: Secondary | ICD-10-CM | POA: Diagnosis not present

## 2023-11-08 DIAGNOSIS — M6281 Muscle weakness (generalized): Secondary | ICD-10-CM | POA: Diagnosis not present

## 2023-11-08 DIAGNOSIS — M7072 Other bursitis of hip, left hip: Secondary | ICD-10-CM | POA: Diagnosis not present

## 2023-11-08 DIAGNOSIS — R2689 Other abnormalities of gait and mobility: Secondary | ICD-10-CM | POA: Diagnosis not present

## 2023-11-08 DIAGNOSIS — R278 Other lack of coordination: Secondary | ICD-10-CM | POA: Diagnosis not present

## 2023-11-08 DIAGNOSIS — Z96651 Presence of right artificial knee joint: Secondary | ICD-10-CM | POA: Diagnosis not present

## 2023-11-09 ENCOUNTER — Telehealth: Payer: Self-pay | Admitting: Surgery

## 2023-11-09 ENCOUNTER — Ambulatory Visit

## 2023-11-09 NOTE — Telephone Encounter (Signed)
 Patient was in last week to see Dr. Dana Duncan for lymphedema left leg. The leg was wrapped.  He has kept a sock and plastic bag over the wrap, but now since it has been raining a lot it has now soaked through the wrapping.  Patient wants to know what to do. Do we need to bring in for re-wrapping.  Please advise.

## 2023-11-10 ENCOUNTER — Ambulatory Visit (INDEPENDENT_AMBULATORY_CARE_PROVIDER_SITE_OTHER)

## 2023-11-10 DIAGNOSIS — R278 Other lack of coordination: Secondary | ICD-10-CM | POA: Diagnosis not present

## 2023-11-10 DIAGNOSIS — M6281 Muscle weakness (generalized): Secondary | ICD-10-CM | POA: Diagnosis not present

## 2023-11-10 DIAGNOSIS — M7072 Other bursitis of hip, left hip: Secondary | ICD-10-CM | POA: Diagnosis not present

## 2023-11-10 DIAGNOSIS — R2689 Other abnormalities of gait and mobility: Secondary | ICD-10-CM | POA: Diagnosis not present

## 2023-11-10 DIAGNOSIS — I890 Lymphedema, not elsewhere classified: Secondary | ICD-10-CM

## 2023-11-10 DIAGNOSIS — Z96651 Presence of right artificial knee joint: Secondary | ICD-10-CM | POA: Diagnosis not present

## 2023-11-10 NOTE — Progress Notes (Signed)
 Patient came in today for a wound check.  The wound is clean, with no signs of infection noted. Follow up as scheduled. Dressing removed and telfa placed over wounds, cling gauze and coban wrap to left leg.

## 2023-11-12 DIAGNOSIS — M7072 Other bursitis of hip, left hip: Secondary | ICD-10-CM | POA: Diagnosis not present

## 2023-11-12 DIAGNOSIS — R2689 Other abnormalities of gait and mobility: Secondary | ICD-10-CM | POA: Diagnosis not present

## 2023-11-12 DIAGNOSIS — M6281 Muscle weakness (generalized): Secondary | ICD-10-CM | POA: Diagnosis not present

## 2023-11-12 DIAGNOSIS — R278 Other lack of coordination: Secondary | ICD-10-CM | POA: Diagnosis not present

## 2023-11-12 DIAGNOSIS — Z96651 Presence of right artificial knee joint: Secondary | ICD-10-CM | POA: Diagnosis not present

## 2023-11-15 DIAGNOSIS — B351 Tinea unguium: Secondary | ICD-10-CM | POA: Diagnosis not present

## 2023-11-15 DIAGNOSIS — E1142 Type 2 diabetes mellitus with diabetic polyneuropathy: Secondary | ICD-10-CM | POA: Diagnosis not present

## 2023-11-17 DIAGNOSIS — Z96651 Presence of right artificial knee joint: Secondary | ICD-10-CM | POA: Diagnosis not present

## 2023-11-17 DIAGNOSIS — R278 Other lack of coordination: Secondary | ICD-10-CM | POA: Diagnosis not present

## 2023-11-17 DIAGNOSIS — M6281 Muscle weakness (generalized): Secondary | ICD-10-CM | POA: Diagnosis not present

## 2023-11-17 DIAGNOSIS — M7072 Other bursitis of hip, left hip: Secondary | ICD-10-CM | POA: Diagnosis not present

## 2023-11-17 DIAGNOSIS — R2689 Other abnormalities of gait and mobility: Secondary | ICD-10-CM | POA: Diagnosis not present

## 2023-11-18 DIAGNOSIS — E1159 Type 2 diabetes mellitus with other circulatory complications: Secondary | ICD-10-CM | POA: Diagnosis not present

## 2023-11-18 DIAGNOSIS — R0602 Shortness of breath: Secondary | ICD-10-CM | POA: Diagnosis not present

## 2023-11-18 DIAGNOSIS — Z8673 Personal history of transient ischemic attack (TIA), and cerebral infarction without residual deficits: Secondary | ICD-10-CM | POA: Diagnosis not present

## 2023-11-18 DIAGNOSIS — Z8679 Personal history of other diseases of the circulatory system: Secondary | ICD-10-CM | POA: Diagnosis not present

## 2023-11-18 DIAGNOSIS — Z794 Long term (current) use of insulin: Secondary | ICD-10-CM | POA: Diagnosis not present

## 2023-11-18 DIAGNOSIS — E1142 Type 2 diabetes mellitus with diabetic polyneuropathy: Secondary | ICD-10-CM | POA: Diagnosis not present

## 2023-11-18 DIAGNOSIS — Z9889 Other specified postprocedural states: Secondary | ICD-10-CM | POA: Diagnosis not present

## 2023-11-18 DIAGNOSIS — E785 Hyperlipidemia, unspecified: Secondary | ICD-10-CM | POA: Diagnosis not present

## 2023-11-18 DIAGNOSIS — I152 Hypertension secondary to endocrine disorders: Secondary | ICD-10-CM | POA: Diagnosis not present

## 2023-11-18 DIAGNOSIS — I480 Paroxysmal atrial fibrillation: Secondary | ICD-10-CM | POA: Diagnosis not present

## 2023-11-18 DIAGNOSIS — I4819 Other persistent atrial fibrillation: Secondary | ICD-10-CM | POA: Diagnosis not present

## 2023-11-18 DIAGNOSIS — E1169 Type 2 diabetes mellitus with other specified complication: Secondary | ICD-10-CM | POA: Diagnosis not present

## 2023-11-18 LAB — HEMOGLOBIN A1C: Hemoglobin A1C: 6.3

## 2023-11-19 ENCOUNTER — Telehealth: Payer: Self-pay

## 2023-11-19 DIAGNOSIS — M7072 Other bursitis of hip, left hip: Secondary | ICD-10-CM | POA: Diagnosis not present

## 2023-11-19 DIAGNOSIS — M6281 Muscle weakness (generalized): Secondary | ICD-10-CM | POA: Diagnosis not present

## 2023-11-19 DIAGNOSIS — R2689 Other abnormalities of gait and mobility: Secondary | ICD-10-CM | POA: Diagnosis not present

## 2023-11-19 DIAGNOSIS — Z96651 Presence of right artificial knee joint: Secondary | ICD-10-CM | POA: Diagnosis not present

## 2023-11-19 DIAGNOSIS — R278 Other lack of coordination: Secondary | ICD-10-CM | POA: Diagnosis not present

## 2023-11-19 NOTE — Telephone Encounter (Signed)
 Pt aware for to [place] eye provider.

## 2023-11-19 NOTE — Telephone Encounter (Signed)
 Copied from CRM [id number]. Topic: Clinical - Medication Question >> Nov 19, 2023 10:06 AM Hobson Luna F wrote: Reason for CRM: Patient is calling in requesting to speak with Dr. Ava Lei nurse regarding a prescription for eyedrops. Please follow up with patient.

## 2023-11-19 NOTE — Telephone Encounter (Signed)
 Pt would like some eyedrops prescribed to help with his allergies. Please advice

## 2023-11-22 DIAGNOSIS — H2702 Aphakia, left eye: Secondary | ICD-10-CM | POA: Diagnosis not present

## 2023-11-22 DIAGNOSIS — H1012 Acute atopic conjunctivitis, left eye: Secondary | ICD-10-CM | POA: Diagnosis not present

## 2023-11-22 DIAGNOSIS — R278 Other lack of coordination: Secondary | ICD-10-CM | POA: Diagnosis not present

## 2023-11-22 DIAGNOSIS — Z96651 Presence of right artificial knee joint: Secondary | ICD-10-CM | POA: Diagnosis not present

## 2023-11-22 DIAGNOSIS — M3501 Sicca syndrome with keratoconjunctivitis: Secondary | ICD-10-CM | POA: Diagnosis not present

## 2023-11-22 DIAGNOSIS — M7072 Other bursitis of hip, left hip: Secondary | ICD-10-CM | POA: Diagnosis not present

## 2023-11-22 DIAGNOSIS — M6281 Muscle weakness (generalized): Secondary | ICD-10-CM | POA: Diagnosis not present

## 2023-11-22 DIAGNOSIS — R2689 Other abnormalities of gait and mobility: Secondary | ICD-10-CM | POA: Diagnosis not present

## 2023-11-22 DIAGNOSIS — H401122 Primary open-angle glaucoma, left eye, moderate stage: Secondary | ICD-10-CM | POA: Diagnosis not present

## 2023-11-23 ENCOUNTER — Ambulatory Visit (INDEPENDENT_AMBULATORY_CARE_PROVIDER_SITE_OTHER): Admitting: Vascular Surgery

## 2023-11-23 ENCOUNTER — Encounter (INDEPENDENT_AMBULATORY_CARE_PROVIDER_SITE_OTHER): Payer: Self-pay | Admitting: Vascular Surgery

## 2023-11-23 VITALS — BP 155/82 | HR 86 | Resp 16 | Ht 75.5 in | Wt 201.0 lb

## 2023-11-23 DIAGNOSIS — E1129 Type 2 diabetes mellitus with other diabetic kidney complication: Secondary | ICD-10-CM | POA: Diagnosis not present

## 2023-11-23 DIAGNOSIS — R809 Proteinuria, unspecified: Secondary | ICD-10-CM

## 2023-11-23 DIAGNOSIS — L97929 Non-pressure chronic ulcer of unspecified part of left lower leg with unspecified severity: Secondary | ICD-10-CM

## 2023-11-23 DIAGNOSIS — I10 Essential (primary) hypertension: Secondary | ICD-10-CM

## 2023-11-23 DIAGNOSIS — Z794 Long term (current) use of insulin: Secondary | ICD-10-CM

## 2023-11-23 DIAGNOSIS — E785 Hyperlipidemia, unspecified: Secondary | ICD-10-CM

## 2023-11-23 DIAGNOSIS — E1169 Type 2 diabetes mellitus with other specified complication: Secondary | ICD-10-CM | POA: Diagnosis not present

## 2023-11-23 NOTE — Assessment & Plan Note (Signed)
 blood glucose control important in reducing the progression of atherosclerotic disease. Also, involved in wound healing. On appropriate medications.

## 2023-11-23 NOTE — Assessment & Plan Note (Signed)
 The patient has a recurrent nonhealing ulceration of the left lower extremity.  This is very concerning for venous insufficiency.  I discussed the pathophysiology and natural history of venous insufficiency.  I have recommended a venous reflux study be done in the near future at his convenience.  I discussed the reason and rationale for venous treatment.  I discussed study showing a markedly increased rate of recurrent wounds with venous insufficiency over those who have had the venous insufficiency treated.  Patient voices his understanding and we will see him back following his reflux study.

## 2023-11-23 NOTE — Assessment & Plan Note (Signed)
 lipid control important in reducing the progression of atherosclerotic disease. Continue statin therapy

## 2023-11-23 NOTE — Progress Notes (Addendum)
 Patient ID: Jeremy [name], male   DOB: [birth year], 88 y.o.   MRN: [id number]  Chief Complaint  Patient presents with   Establish Care    New patient Consult lymphedema ref. [place]    HPI Jeremy [name] is a 88 y.o. male.  I am asked to see the patient by Dr. Dana Duncan for evaluation of nonhealing venous stasis ulceration of the left lower extremity.  He previously had an ulceration on this leg some years ago but ultimately got it to heal up.  He also had an ulceration that was treated by general surgeon no longer practicing.  This was over a decade ago.  Recently, Dr. Dana Duncan has been doing Unna boot changes appropriately on the large left medial lower leg wound but it has been very slow to heal.  He has no known history of DVT or superficial thrombophlebitis.  He does have some chronic swelling in the left leg.  I have previously treated his wife for pulmonary embolus in years past.     Past Medical History:  Diagnosis Date   Allergy    Basal cell carcinoma 10/09/2021   right medial forehead at glabella, Mohs 01/22/22   Chronic kidney disease    Diabetes mellitus without complication (HCC)    Diabetic neuropathy (HCC)    Diverticula, colon 1980's   Diverticulosis    Enlarged prostate    GERD (gastroesophageal reflux disease)    Glaucoma    Hemorrhoids    High cholesterol    History of hiatal hernia    Hyperlipidemia    Hypertension    Obesity    Onychomycosis    Seizures (HCC)    Small bowel obstruction (HCC) 1990's   Urination disorder     Past Surgical History:  Procedure Laterality Date   APPENDECTOMY     BLADDER DIVERTICULECTOMY  2013   CARDIOVERSION N/A 02/24/2023   Procedure: CARDIOVERSION;  Surgeon: Percival Brace, MD;  Location: [facility] ORS;  Service: Cardiovascular;  Laterality: N/A;   CARDIOVERSION N/A 04/21/2023   Procedure: CARDIOVERSION;  Surgeon: Percival Brace, MD;  Location: [facility];  Service: Cardiovascular;  Laterality: N/A;   CARPAL TUNNEL  RELEASE  01/2022   revision right side   COLON SURGERY Left 1980's   COLONOSCOPY W/ POLYPECTOMY  1990's   Dr [name]   COLONOSCOPY WITH PROPOFOL  N/A 12/21/2014   Procedure: COLONOSCOPY WITH PROPOFOL ;  Surgeon: [name], MD;  Location: [facility] ENDOSCOPY;  Service: Endoscopy;  Laterality: N/A;   COLONOSCOPY WITH PROPOFOL  N/A 08/16/2017   Procedure: COLONOSCOPY WITH PROPOFOL ;  Surgeon: [name], MD;  Location: [facility] ENDOSCOPY;  Service: Endoscopy;  Laterality: N/A;   COLONOSCOPY WITH PROPOFOL  N/A 01/31/2021   Procedure: COLONOSCOPY WITH PROPOFOL ;  Surgeon: [name], MD;  Location: [facility] ENDOSCOPY;  Service: Endoscopy;  Laterality: N/A;   ESOPHAGOGASTRODUODENOSCOPY (EGD) WITH PROPOFOL  N/A 02/15/2015   Procedure: ESOPHAGOGASTRODUODENOSCOPY (EGD) WITH PROPOFOL ;  Surgeon: [name], MD;  Location: [facility] ENDOSCOPY;  Service: Endoscopy;  Laterality: N/A;   ESOPHAGOGASTRODUODENOSCOPY (EGD) WITH PROPOFOL  N/A 06/10/2017   Procedure: ESOPHAGOGASTRODUODENOSCOPY (EGD) WITH PROPOFOL ;  Surgeon: [name], MD;  Location: [facility] ENDOSCOPY;  Service: Endoscopy;  Laterality: N/A;   ESOPHAGOGASTRODUODENOSCOPY (EGD) WITH PROPOFOL  N/A 08/16/2017   Procedure: ESOPHAGOGASTRODUODENOSCOPY (EGD) WITH PROPOFOL ;  Surgeon: [name], MD;  Location: [facility] ENDOSCOPY;  Service: Endoscopy;  Laterality: N/A;   ESOPHAGOGASTRODUODENOSCOPY (EGD) WITH PROPOFOL  N/A 01/31/2021   Procedure: ESOPHAGOGASTRODUODENOSCOPY (EGD) WITH PROPOFOL ;  Surgeon: [name], MD;  Location: [facility] ENDOSCOPY;  Service: Endoscopy;  Laterality: N/A;   EYE SURGERY     GLAUCOMA SURGERY Left 1990's   [facility]   HEMORROIDECTOMY  [phone]   [facility]   HERNIA REPAIR     [facility], Dr [name] HERNIA REPAIR Bilateral [phone]   LARYNX SURGERY  1990   3 times   MOHS SURGERY  01/22/2022   Dr. Bartholomew Light - [facility]   REPLACEMENT TOTAL KNEE Right    RETINAL DETACHMENT SURGERY Right     TONSILLECTOMY     turp  2013   URETER SURGERY  2013   Dr [name]     Family History  Problem Relation Age of Onset   Diabetes Mother    Cataracts Mother    Metabolic syndrome Mother    Blindness Mother        r/t diabetes   Cataracts Father    Glaucoma Father        Retina-Detachment   Atrial fibrillation Father    Diverticulitis Sister    Glaucoma Brother    Diabetes Brother    Cancer Sister        Gallbladder   Cancer Daughter      Social History   Tobacco Use   Smoking status: Never   Smokeless tobacco: Never   Tobacco comments:    smoking cessation materials not required  Vaping Use   Vaping status: Never Used  Substance Use Topics   Alcohol use: No   Drug use: No  Retired Sports administrator  Allergies  Allergen Reactions   Demerol [Meperidine] Other (See Comments) and Anaphylaxis    MAKES BLOOD PRESSURE BOTTOM OUT hypotension   Metoprolol Shortness Of Breath   Dilaudid [Hydromorphone Hcl]     MAKES BLOOD PRESSURE BOTTOM OUT   Actos [Pioglitazone] Other (See Comments) and Hives   Dapagliflozin Rash   Penicillins Rash    Current Outpatient Medications  Medication Sig Dispense Refill   acetaminophen  (TYLENOL ) 500 MG tablet Take 1,000 mg by mouth 2 (two) times daily as needed for moderate pain (pain score 4-6).     apixaban (ELIQUIS) 5 MG TABS tablet Take 5 mg by mouth 2 (two) times daily.     brimonidine-timolol (COMBIGAN) 0.2-0.5 % ophthalmic solution Place 1 drop into the left eye 2 (two) times daily.     diclofenac  Sodium (VOLTAREN ) 1 % GEL Apply 4 g topically 4 (four) times daily. 300 g 1   diltiazem (CARDIZEM CD) 120 MG 24 hr capsule Take 120 mg by mouth daily.     dorzolamide (TRUSOPT) 2 % ophthalmic solution Place 1 drop into the left eye 2 (two) times daily.  3   empagliflozin (JARDIANCE) 25 MG TABS tablet Take 25 mg by mouth daily.     Exenatide  ER (BYDUREON  BCISE) 2 MG/0.85ML AUIJ Inject 2 mg into the skin every Sunday.     finasteride  (PROSCAR ) 5 MG  tablet Take 1 tablet by mouth once daily 90 tablet 1   flecainide (TAMBOCOR) 50 MG tablet Take 100 mg by mouth 2 (two) times daily.     furosemide  (LASIX ) 20 MG tablet Take 1 tablet (20 mg total) by mouth daily. 6 tablet 0   glipiZIDE (GLUCOTROL) 10 MG tablet Take 10 mg by mouth daily.     hydrocortisone  2.5 % cream APPLY TO THE AFFECTED AREA OF FACE AND EARS EVERY OTHER NIGHT ALTERNATING WITH KETOCONAZOLE  CREAM 28 g 11   insulin degludec (TRESIBA FLEXTOUCH) 200 UNIT/ML FlexTouch Pen Inject 16  Units into the skin in the morning.     ketoconazole  (NIZORAL ) 2 % cream Apply two grams to the face every other day alternating with hydrocortisone  2.5% cream every other day. 60 g 11   ketoconazole  (NIZORAL ) 2 % shampoo Apply 1 Application topically once a week. Massea 120 mL 11   loperamide (IMODIUM A-D) 2 MG tablet Take 2 mg by mouth as needed for diarrhea or loose stools.     loratadine (CLARITIN) 10 MG tablet Take 10 mg by mouth daily.     metFORMIN  (GLUCOPHAGE ) 1000 MG tablet Take 1,000 mg by mouth 2 (two) times daily with a meal. On Monday and Wednesday     mometasone  (ELOCON ) 0.1 % lotion Apply topically daily as needed (itchy scalp). Apply to itchy areas of scalp QD- BID PRN 60 mL 5   Multiple Vitamin (MULTIVITAMIN) tablet Take 1 tablet by mouth daily.     omega-3 acid ethyl esters (LOVAZA ) 1 g capsule Take 2 capsules (2 g total) by mouth 2 (two) times daily. 480 capsule 3   omeprazole  (PRILOSEC) 20 MG capsule Take 1 capsule by mouth once daily 90 capsule 0   rosuvastatin  (CRESTOR ) 10 MG tablet Take 1 tablet (10 mg total) by mouth at bedtime. 90 tablet 1   tadalafil  (CIALIS ) 20 MG tablet Take 1 tablet (20 mg total) by mouth daily as needed for erectile dysfunction. 90 tablet 0   telmisartan -hydrochlorothiazide  (MICARDIS  HCT) 80-12.5 MG tablet Take 1 tablet by mouth daily. 90 tablet 1   No current facility-administered medications for this visit.      REVIEW OF SYSTEMS (Negative unless  checked)  Constitutional: [x] Weight loss  [] Fever  [] Chills Cardiac: [] Chest pain   [] Chest pressure   [] Palpitations   [] Shortness of breath when laying flat   [] Shortness of breath at rest   [] Shortness of breath with exertion. Vascular:  [] Pain in legs with walking   [] Pain in legs at rest   [] Pain in legs when laying flat   [] Claudication   [] Pain in feet when walking  [] Pain in feet at rest  [] Pain in feet when laying flat   [] History of DVT   [] Phlebitis   [] Swelling in legs   [] Varicose veins   [x] Non-healing ulcers Pulmonary:   [] Uses home oxygen   [] Productive cough   [] Hemoptysis   [] Wheeze  [] COPD   [] Asthma Neurologic:  [] Dizziness  [] Blackouts   [x] Seizures   [] History of stroke   [] History of TIA  [] Aphasia   [] Temporary blindness   [] Dysphagia   [] Weakness or numbness in arms   [] Weakness or numbness in legs Musculoskeletal:  [x] Arthritis   [] Joint swelling   [] Joint pain   [] Low back pain Hematologic:  [] Easy bruising  [] Easy bleeding   [] Hypercoagulable state   [] Anemic  [] Hepatitis Gastrointestinal:  [] Blood in stool   [] Vomiting blood  [x] Gastroesophageal reflux/heartburn   [] Abdominal pain Genitourinary:  [] Chronic kidney disease   [] Difficult urination  [] Frequent urination  [] Burning with urination   [] Hematuria Skin:  [] Rashes   [x] Ulcers   [x] Wounds Psychological:  [] History of anxiety   []  History of major depression.    Physical Exam BP (!) 155/82 (BP Location: Right Arm, Patient Position: Sitting, Cuff Size: Normal)   Pulse 86   Resp 16   Ht 6' 3.5" (1.918 m)   Wt 201 lb (91.2 kg)   BMI 24.79 kg/m  Gen:  WD/WN, NAD. Appears younger than stated age. Head: [place]/AT, No temporalis wasting.  Ear/Nose/Throat: Hearing grossly  intact, nares w/o erythema or drainage, oropharynx w/o Erythema/Exudate Eyes: Conjunctiva clear, sclera non-icteric  Neck: trachea midline.  No JVD.  Pulmonary:  Good air movement, respirations not labored, no use of accessory muscles  Cardiac:  RRR, no JVD Vascular:  Vessel Right Left  Radial Palpable Palpable                                   Gastrointestinal:. No masses, surgical incisions, or scars. Musculoskeletal: M/S 5/5 throughout.  Extremities without ischemic changes.  No deformity or atrophy.  Left leg is currently wrapped in an Radio broadcast assistant.  Trace lower extremity edema. Neurologic: Sensation grossly intact in extremities.  Symmetrical.  Speech is fluent. Motor exam as listed above. Psychiatric: Judgment intact, Mood & affect appropriate for pt's clinical situation. Dermatologic: No rashes or ulcers noted.  No cellulitis or open wounds.    Radiology No results found.  Labs Recent Results (from the past 2160 hours)  Wound culture     Status: Abnormal   Collection Time: 10/28/23  3:52 PM   Specimen: Wound  Result Value Ref Range   MICRO NUMBER: [id number]    SPECIMEN QUALITY: Adequate    SOURCE: WOUND (SITE NOT SPECIFIED)    STATUS: FINAL    GRAM STAIN:      No epithelial cells seen No white blood cells seen Few Gram positive cocci in pairs Rare Gram positive bacilli   ISOLATE 1: Staphylococcus aureus (A)     Comment: Heavy growth of Staphylococcus aureus      Susceptibility   Staphylococcus aureus - AEROBIC CULT, GRAM STAIN POSITIVE 1    VANCOMYCIN 1 Sensitive     CIPROFLOXACIN  <=0.5 Sensitive     CLINDAMYCIN <=0.25 Sensitive     LEVOFLOXACIN  <=0.12 Sensitive     ERYTHROMYCIN <=0.25 Sensitive     GENTAMICIN <=0.5 Sensitive     OXACILLIN* 0.5 Sensitive      * Oxacillin susceptible staphylococci are susceptible to other penicillinase-stable penicillins (e.g., methicillin, nafcillin), beta- lactam/beta-lactamase inhibitor combinations, and cephems with staphylococcal indications, including cefazolin.     TETRACYCLINE <=1 Sensitive     TRIMETH/SULFA <=10 Sensitive     MOXIFLOXACIN * <=0.25 Sensitive      * Oxacillin susceptible staphylococci are susceptible to other penicillinase-stable penicillins  (e.g., methicillin, nafcillin), beta- lactam/beta-lactamase inhibitor combinations, and cephems with staphylococcal indications, including cefazolin. Legend: S = Susceptible  I = Intermediate R = Resistant  NS = Not susceptible SDD = Susceptible Dose Dependent * = Not Tested  NR = Not Reported **NN = See Therapy Comments   HM DIABETES EYE EXAM     Status: Abnormal   Collection Time: 11/04/23  4:38 PM  Result Value Ref Range   HM Diabetic Eye Exam Retinopathy (A) No Retinopathy    Comment: Abstracted by HIM    Assessment/Plan:  Skin ulcer of left lower leg (HCC) The patient has a recurrent nonhealing ulceration of the left lower extremity.  This is very concerning for venous insufficiency.  I discussed the pathophysiology and natural history of venous insufficiency.  I have recommended a venous reflux study be done in the near future at his convenience.  I discussed the reason and rationale for venous treatment.  I discussed study showing a markedly increased rate of recurrent wounds with venous insufficiency over those who have had the venous insufficiency treated.  Patient voices his understanding and we will see him back following  his reflux study.  Type 2 diabetes mellitus with microalbuminuria, with long-term current use of insulin (HCC) blood glucose control important in reducing the progression of atherosclerotic disease. Also, involved in wound healing. On appropriate medications.   Hyperlipidemia due to type 2 diabetes mellitus (HCC) lipid control important in reducing the progression of atherosclerotic disease. Continue statin therapy   Hypertension, benign blood pressure control important in reducing the progression of atherosclerotic disease. On appropriate oral medications.      Mikki Alexander 11/23/2023, 4:11 PM   This note was created with Dragon medical transcription system.  Any errors from dictation are unintentional.

## 2023-11-23 NOTE — Assessment & Plan Note (Signed)
 blood pressure control important in reducing the progression of atherosclerotic disease. On appropriate oral medications.

## 2023-11-24 ENCOUNTER — Ambulatory Visit: Admitting: Surgery

## 2023-11-24 ENCOUNTER — Ambulatory Visit (INDEPENDENT_AMBULATORY_CARE_PROVIDER_SITE_OTHER): Admitting: Physician Assistant

## 2023-11-24 ENCOUNTER — Encounter: Payer: Self-pay | Admitting: Physician Assistant

## 2023-11-24 VITALS — BP 126/80 | HR 85 | Ht 75.5 in | Wt 198.0 lb

## 2023-11-24 DIAGNOSIS — I890 Lymphedema, not elsewhere classified: Secondary | ICD-10-CM

## 2023-11-24 DIAGNOSIS — R278 Other lack of coordination: Secondary | ICD-10-CM | POA: Diagnosis not present

## 2023-11-24 DIAGNOSIS — Z96651 Presence of right artificial knee joint: Secondary | ICD-10-CM | POA: Diagnosis not present

## 2023-11-24 DIAGNOSIS — M6281 Muscle weakness (generalized): Secondary | ICD-10-CM | POA: Diagnosis not present

## 2023-11-24 DIAGNOSIS — S81802D Unspecified open wound, left lower leg, subsequent encounter: Secondary | ICD-10-CM

## 2023-11-24 DIAGNOSIS — R2689 Other abnormalities of gait and mobility: Secondary | ICD-10-CM | POA: Diagnosis not present

## 2023-11-24 DIAGNOSIS — M7072 Other bursitis of hip, left hip: Secondary | ICD-10-CM | POA: Diagnosis not present

## 2023-11-24 NOTE — Patient Instructions (Signed)
 Follow-up with our office next week.    Please call and ask to speak with a nurse if you develop questions or concerns.

## 2023-11-24 NOTE — Progress Notes (Signed)
 [name] SURGICAL CLINIC NOTE  11/26/2023  History of Present Illness: Jeremy [name] is a 88 y.o. male known to our service secondary to LLE wounds. He was last seen in our clinic on 05/19 and was managed with unna boot. He has since followed up with Dr Vonna Guardian due to concerns for venous insufficiency. There are plans for evaluation of this on 06/20. He is also due to establish with the wound care center on 06/23. Today, he reports that he has done well at home. No issues with LLE pain. No fever, chills. He is maintaining his dressing without issue. He is able to ambulate on this with cane. No other complaints or acute changes.   Past Medical History: Past Medical History:  Diagnosis Date   Allergy    Basal cell carcinoma 10/09/2021   right medial forehead at glabella, Mohs 01/22/22   Chronic kidney disease    Diabetes mellitus without complication (HCC)    Diabetic neuropathy (HCC)    Diverticula, colon 1980's   Diverticulosis    Enlarged prostate    GERD (gastroesophageal reflux disease)    Glaucoma    Hemorrhoids    High cholesterol    History of hiatal hernia    Hyperlipidemia    Hypertension    Obesity    Onychomycosis    Seizures (HCC)    Small bowel obstruction (HCC) 1990's   Urination disorder      Past Surgical History: Past Surgical History:  Procedure Laterality Date   APPENDECTOMY     BLADDER DIVERTICULECTOMY  2013   CARDIOVERSION N/A 02/24/2023   Procedure: CARDIOVERSION;  Surgeon: Percival Brace, MD;  Location: [facility] ORS;  Service: Cardiovascular;  Laterality: N/A;   CARDIOVERSION N/A 04/21/2023   Procedure: CARDIOVERSION;  Surgeon: Percival Brace, MD;  Location: [facility] ORS;  Service: Cardiovascular;  Laterality: N/A;   CARPAL TUNNEL RELEASE  01/2022   revision right side   COLON SURGERY Left 1980's   COLONOSCOPY W/ POLYPECTOMY  1990's   Dr [name]   COLONOSCOPY WITH PROPOFOL  N/A 12/21/2014   Procedure: COLONOSCOPY WITH PROPOFOL ;   Surgeon: [name], MD;  Location: [facility] ENDOSCOPY;  Service: Endoscopy;  Laterality: N/A;   COLONOSCOPY WITH PROPOFOL  N/A 08/16/2017   Procedure: COLONOSCOPY WITH PROPOFOL ;  Surgeon: [name], MD;  Location: [facility] ENDOSCOPY;  Service: Endoscopy;  Laterality: N/A;   COLONOSCOPY WITH PROPOFOL  N/A 01/31/2021   Procedure: COLONOSCOPY WITH PROPOFOL ;  Surgeon: [name], MD;  Location: [facility] ENDOSCOPY;  Service: Endoscopy;  Laterality: N/A;   ESOPHAGOGASTRODUODENOSCOPY (EGD) WITH PROPOFOL  N/A 02/15/2015   Procedure: ESOPHAGOGASTRODUODENOSCOPY (EGD) WITH PROPOFOL ;  Surgeon: [name], MD;  Location: [facility] ENDOSCOPY;  Service: Endoscopy;  Laterality: N/A;   ESOPHAGOGASTRODUODENOSCOPY (EGD) WITH PROPOFOL  N/A 06/10/2017   Procedure: ESOPHAGOGASTRODUODENOSCOPY (EGD) WITH PROPOFOL ;  Surgeon: [name], MD;  Location: [facility] ENDOSCOPY;  Service: Endoscopy;  Laterality: N/A;   ESOPHAGOGASTRODUODENOSCOPY (EGD) WITH PROPOFOL  N/A 08/16/2017   Procedure: ESOPHAGOGASTRODUODENOSCOPY (EGD) WITH PROPOFOL ;  Surgeon: [name], MD;  Location: [facility] ENDOSCOPY;  Service: Endoscopy;  Laterality: N/A;   ESOPHAGOGASTRODUODENOSCOPY (EGD) WITH PROPOFOL  N/A 01/31/2021   Procedure: ESOPHAGOGASTRODUODENOSCOPY (EGD) WITH PROPOFOL ;  Surgeon: [name], MD;  Location: [facility] ENDOSCOPY;  Service: Endoscopy;  Laterality: N/A;   EYE SURGERY     GLAUCOMA SURGERY Left 1990's   [facility]   HEMORROIDECTOMY  1980's   [facility]   HERNIA REPAIR     [facility], Dr [name] HERNIA REPAIR Bilateral  1960's   LARYNX SURGERY  1990   3 times   MOHS SURGERY  01/22/2022   [facility]   REPLACEMENT TOTAL KNEE Right    RETINAL DETACHMENT SURGERY Right    TONSILLECTOMY     turp  2013   URETER SURGERY  2013   Dr [name]    Home Medications: Prior to Admission medications   Medication Sig Start Date End Date Taking? Authorizing Provider  acetaminophen  (TYLENOL )  500 MG tablet Take 1,000 mg by mouth 2 (two) times daily as needed for moderate pain (pain score 4-6).   Yes [provider]  apixaban (ELIQUIS) 5 MG TABS tablet Take 5 mg by mouth 2 (two) times daily.   Yes [provider]  brimonidine-timolol (COMBIGAN) 0.2-0.5 % ophthalmic solution Place 1 drop into the left eye 2 (two) times daily.   Yes [provider]  diclofenac  Sodium (VOLTAREN ) 1 % GEL Apply 4 g topically 4 (four) times daily. 07/23/23  Yes Sowles, Krichna, MD  diltiazem (CARDIZEM CD) 120 MG 24 hr capsule Take 120 mg by mouth daily.   Yes [provider]  dorzolamide (TRUSOPT) 2 % ophthalmic solution Place 1 drop into the left eye 2 (two) times daily. 10/23/17  Yes Porfilio, Sammie Crigler, MD  empagliflozin (JARDIANCE) 25 MG TABS tablet Take 25 mg by mouth daily.   Yes [provider]  Exenatide  ER (BYDUREON  BCISE) 2 MG/0.85ML AUIJ Inject 2 mg into the skin every Sunday. 10/03/19  Yes Berton Brock, MD  finasteride  (PROSCAR ) 5 MG tablet Take 1 tablet by mouth once daily 06/29/23  Yes Sowles, Krichna, MD  flecainide (TAMBOCOR) 50 MG tablet Take 100 mg by mouth 2 (two) times daily.   Yes [provider]  furosemide  (LASIX ) 20 MG tablet Take 1 tablet (20 mg total) by mouth daily. 10/14/23  Yes Rockney Cid, DO  glipiZIDE (GLUCOTROL) 10 MG tablet Take 10 mg by mouth daily. 12/04/22  Yes Berton Brock, MD  hydrocortisone  2.5 % cream APPLY TO THE AFFECTED AREA OF FACE AND EARS EVERY OTHER NIGHT ALTERNATING WITH KETOCONAZOLE  CREAM 10/20/23  Yes Elta Halter, MD  insulin degludec (TRESIBA FLEXTOUCH) 200 UNIT/ML FlexTouch Pen Inject 16 Units into the skin in the morning. 02/06/21  Yes Berton Brock, MD  ketoconazole  (NIZORAL ) 2 % cream Apply two grams to the face every other day alternating with hydrocortisone  2.5% cream every other day. 10/20/23  Yes Elta Halter, MD  ketoconazole  (NIZORAL ) 2 % shampoo Apply 1 Application topically  once a week. Massea 10/20/23  Yes Elta Halter, MD  loperamide (IMODIUM A-D) 2 MG tablet Take 2 mg by mouth as needed for diarrhea or loose stools.   Yes [provider]  loratadine (CLARITIN) 10 MG tablet Take 10 mg by mouth daily.   Yes [provider]  metFORMIN  (GLUCOPHAGE ) 1000 MG tablet Take 1,000 mg by mouth 2 (two) times daily with a meal. On Monday and Wednesday   Yes Berton Brock, MD  mometasone  (ELOCON ) 0.1 % lotion Apply topically daily as needed (itchy scalp). Apply to itchy areas of scalp QD- BID PRN 10/20/23  Yes Elta Halter, MD  Multiple Vitamin (MULTIVITAMIN) tablet Take 1 tablet by mouth daily.   Yes [provider]  omega-3 acid ethyl esters (LOVAZA ) 1 g capsule Take 2 capsules (2 g total) by mouth 2 (two) times daily. 05/10/23  Yes Sowles, Krichna, MD  omeprazole  (PRILOSEC) 20 MG capsule Take 1 capsule  by mouth once daily 11/04/23  Yes Sowles, Krichna, MD  rosuvastatin  (CRESTOR ) 10 MG tablet Take 1 tablet (10 mg total) by mouth at bedtime. 05/10/23  Yes Sowles, Krichna, MD  tadalafil  (CIALIS ) 20 MG tablet Take 1 tablet (20 mg total) by mouth daily as needed for erectile dysfunction. 08/12/23  Yes Sowles, Krichna, MD  telmisartan -hydrochlorothiazide  (MICARDIS  HCT) 80-12.5 MG tablet Take 1 tablet by mouth daily. 10/26/23  Yes Sowles, Krichna, MD    Allergies: Allergies  Allergen Reactions   Demerol [Meperidine] Other (See Comments) and Anaphylaxis    MAKES BLOOD PRESSURE BOTTOM OUT hypotension   Metoprolol Shortness Of Breath   Dilaudid [Hydromorphone Hcl]     MAKES BLOOD PRESSURE BOTTOM OUT   Actos [Pioglitazone] Other (See Comments) and Hives   Dapagliflozin Rash   Penicillins Rash    Review of Systems: Review of Systems  Constitutional:  Negative for chills and fever.  Respiratory:  Negative for cough.   Cardiovascular:  Negative for chest pain and palpitations.  Gastrointestinal:  Negative for nausea and vomiting.  Skin:   Negative for itching and rash.       + LLE Wound (Chronic, improving)  All other systems reviewed and are negative.   Physical Exam BP 126/80   Pulse 85   Ht 6' 3.5 (1.918 m)   Wt 198 lb (89.8 kg)   SpO2 96%   BMI 24.42 kg/m   Physical Exam Vitals and nursing note reviewed. Exam conducted with a chaperone present.  Constitutional:      General: He is not in acute distress.    Appearance: Normal appearance. He is normal weight. He is not ill-appearing.     Comments: Sitting up on exam table, NAD  HENT:     Head: Normocephalic and atraumatic.   Eyes:     General: No scleral icterus.    Conjunctiva/sclera: Conjunctivae normal.     Comments: Wearing glasses   Pulmonary:     Effort: Pulmonary effort is normal. No respiratory distress.  Genitourinary:    Comments: Deferred  Skin:    General: Skin is warm and dry.     Findings: Wound present.     Comments: To the LLE, anterior shin, there is a large well demarcated eschar, there is no erythema, no swelling, no drainage. No evidence of active infection, abscess, no necrosis.    Neurological:     General: No focal deficit present.     Mental Status: He is alert and oriented to person, place, and time.   Psychiatric:        Mood and Affect: Mood normal.        Behavior: Behavior normal.     LLE Wound (11/24/2023):    Labs/Imaging: No new labs or imaging    Assessment and Plan: This is a 88 y.o. male with chronic, healing, LLE wound/eschar   - Wound appears to be improving slowly. I do not appreciate a need for urgent debridement nor additional antibiotics at this time.   - He can continue wound care which I preformed today. Applied silvadene to coat eschar, non-adherent dressing applied on top of this. The leg was then wrapped. He will maintain this dressing with plans to change next week  - Appreciate vascular assistance; follow up venous study on 06/20  - Appreciate wound care center availability. He establishes  there on 06/23. I do think this will be his best resource moving forward once he is established.   - I will see him  again on 06/18 for wound check and dressing change.   Face-to-face time spent with the patient and care providers was 20 minutes, with more than 50% of the time spent counseling, educating, and coordinating care of the patient.     Apolonio Bay, PA-C [facility] 11/26/2023, 9:36 AM M-F: 7am - 4pm

## 2023-11-26 DIAGNOSIS — M7072 Other bursitis of hip, left hip: Secondary | ICD-10-CM | POA: Diagnosis not present

## 2023-11-26 DIAGNOSIS — R2689 Other abnormalities of gait and mobility: Secondary | ICD-10-CM | POA: Diagnosis not present

## 2023-11-26 DIAGNOSIS — R278 Other lack of coordination: Secondary | ICD-10-CM | POA: Diagnosis not present

## 2023-11-26 DIAGNOSIS — M6281 Muscle weakness (generalized): Secondary | ICD-10-CM | POA: Diagnosis not present

## 2023-11-26 DIAGNOSIS — Z96651 Presence of right artificial knee joint: Secondary | ICD-10-CM | POA: Diagnosis not present

## 2023-11-29 DIAGNOSIS — Z96651 Presence of right artificial knee joint: Secondary | ICD-10-CM | POA: Diagnosis not present

## 2023-11-29 DIAGNOSIS — M7072 Other bursitis of hip, left hip: Secondary | ICD-10-CM | POA: Diagnosis not present

## 2023-11-29 DIAGNOSIS — R278 Other lack of coordination: Secondary | ICD-10-CM | POA: Diagnosis not present

## 2023-11-29 DIAGNOSIS — M6281 Muscle weakness (generalized): Secondary | ICD-10-CM | POA: Diagnosis not present

## 2023-11-29 DIAGNOSIS — R2689 Other abnormalities of gait and mobility: Secondary | ICD-10-CM | POA: Diagnosis not present

## 2023-11-30 ENCOUNTER — Other Ambulatory Visit: Payer: Self-pay | Admitting: Family Medicine

## 2023-11-30 DIAGNOSIS — E1169 Type 2 diabetes mellitus with other specified complication: Secondary | ICD-10-CM

## 2023-11-30 NOTE — Telephone Encounter (Signed)
 Requested Prescriptions  Pending Prescriptions Disp Refills   rosuvastatin  (CRESTOR ) 10 MG tablet [Pharmacy Med Name: Rosuvastatin  Calcium  10 MG Oral Tablet] 90 tablet 0    Sig: TAKE 1 TABLET BY MOUTH AT BEDTIME     Cardiovascular:  Antilipid - Statins 2 Failed - 11/30/2023  4:32 PM      Failed - Lipid Panel in normal range within the last 12 months    Cholesterol  Date Value Ref Range Status  06/21/2023 89 <200 mg/dL Final   LDL Cholesterol (Calc)  Date Value Ref Range Status  06/21/2023 36 mg/dL (calc) Final    Comment:    Reference range: <100 . Desirable range <100 mg/dL for primary prevention;   <70 mg/dL for patients with CHD or diabetic patients  with > or = 2 CHD risk factors. Aaron Aas LDL-C is now calculated using the Martin-Hopkins  calculation, which is a validated novel method providing  better accuracy than the Friedewald equation in the  estimation of LDL-C.  Melinda Sprawls et al. Erroll Heard. 8295;621(30): 2061-2068  (http://education.[employer].com/faq/FAQ164)    HDL  Date Value Ref Range Status  06/21/2023 33 (L) > OR = 40 mg/dL Final   Triglycerides  Date Value Ref Range Status  06/21/2023 117 <150 mg/dL Final         Passed - Cr in normal range and within 360 days    Creat  Date Value Ref Range Status  06/21/2023 0.86 0.70 - 1.22 mg/dL Final   Creatinine, Urine  Date Value Ref Range Status  06/21/2023 44 20 - 320 mg/dL Final         Passed - Patient is not pregnant      Passed - Valid encounter within last 12 months    Recent Outpatient Visits           1 month ago Open wound of left lower leg, subsequent encounter   [facility] Donny Gall F, FNP   1 month ago Open wound of left lower leg, subsequent encounter   [facility] Arleen Lacer, MD   1 month ago Open wound of left lower leg, subsequent encounter   [facility] Arleen Lacer, MD   1 month ago Edema of left  lower extremity   [facility] Rockney Cid, DO   4 months ago History of total right knee replacement   [facility] Arleen Lacer, MD       Future Appointments             In 3 weeks Sowles, Krichna, MD [facility]   In 11 months Elta Halter, MD [facility]

## 2023-11-30 NOTE — Telephone Encounter (Signed)
 The patient called in stating the pharmacy still hasn't heard back from the provider concerning his refill and I tried to explain it was because it was just received today and each request is allotted 48-72 hrs to respond to so we tell the patients to call in several days before they run out. He still didn't understand. I told him I would pass the message on to see what could be done and encouraged him from now on to call several days before he runs out.

## 2023-12-01 ENCOUNTER — Encounter: Payer: Self-pay | Admitting: Physician Assistant

## 2023-12-01 ENCOUNTER — Ambulatory Visit (INDEPENDENT_AMBULATORY_CARE_PROVIDER_SITE_OTHER): Admitting: Physician Assistant

## 2023-12-01 VITALS — BP 124/79 | HR 82 | Temp 98.6°F | Ht 75.5 in | Wt 196.0 lb

## 2023-12-01 DIAGNOSIS — I890 Lymphedema, not elsewhere classified: Secondary | ICD-10-CM | POA: Diagnosis not present

## 2023-12-01 DIAGNOSIS — Z09 Encounter for follow-up examination after completed treatment for conditions other than malignant neoplasm: Secondary | ICD-10-CM

## 2023-12-01 DIAGNOSIS — S81802D Unspecified open wound, left lower leg, subsequent encounter: Secondary | ICD-10-CM | POA: Diagnosis not present

## 2023-12-01 NOTE — Progress Notes (Signed)
 [facility] SURGICAL CLINIC NOTE  12/01/2023  History of Present Illness: Jeremy [name] is a 88 y.o. male known to our service secondary to LLE wounds. He was last seen in our clinic on 05/19 and was managed with unna boot. He has since followed up with Dr Vonna Guardian due to concerns for venous insufficiency. There are plans for evaluation of this on 06/20. He is also due to establish with the wound care center on 06/23. Today, he reports that he has done well at home. No issues with LLE pain. No fever, chills. He is maintaining his dressing without issue. He is able to ambulate on this with cane.   Interval History:  No acute changes since last visit. He denied any significant pain. No fever, chills. He has maintained the dressing weekly without issue.    Past Medical History: Past Medical History:  Diagnosis Date   Allergy    Basal cell carcinoma 10/09/2021   right medial forehead at glabella, Mohs 01/22/22   Chronic kidney disease    Diabetes mellitus without complication (HCC)    Diabetic neuropathy (HCC)    Diverticula, colon 1980's   Diverticulosis    Enlarged prostate    GERD (gastroesophageal reflux disease)    Glaucoma    Hemorrhoids    High cholesterol    History of hiatal hernia    Hyperlipidemia    Hypertension    Obesity    Onychomycosis    Seizures (HCC)    Small bowel obstruction (HCC) 1990's   Urination disorder      Past Surgical History: Past Surgical History:  Procedure Laterality Date   APPENDECTOMY     BLADDER DIVERTICULECTOMY  2013   CARDIOVERSION N/A 02/24/2023   Procedure: CARDIOVERSION;  Surgeon: Percival Brace, MD;  Location: [facility] ORS;  Service: Cardiovascular;  Laterality: N/A;   CARDIOVERSION N/A 04/21/2023   Procedure: CARDIOVERSION;  Surgeon: Percival Brace, MD;  Location: [facility] ORS;  Service: Cardiovascular;  Laterality: N/A;   CARPAL TUNNEL RELEASE  01/2022   revision right side   COLON SURGERY Left 1980's    COLONOSCOPY W/ POLYPECTOMY  1990's   Dr [name]   COLONOSCOPY WITH PROPOFOL  N/A 12/21/2014   Procedure: COLONOSCOPY WITH PROPOFOL ;  Surgeon: [name], MD;  Location: [facility] ENDOSCOPY;  Service: Endoscopy;  Laterality: N/A;   COLONOSCOPY WITH PROPOFOL  N/A 08/16/2017   Procedure: COLONOSCOPY WITH PROPOFOL ;  Surgeon: [name], MD;  Location: [facility] ENDOSCOPY;  Service: Endoscopy;  Laterality: N/A;   COLONOSCOPY WITH PROPOFOL  N/A 01/31/2021   Procedure: COLONOSCOPY WITH PROPOFOL ;  Surgeon: [name], MD;  Location: [facility] ENDOSCOPY;  Service: Endoscopy;  Laterality: N/A;   ESOPHAGOGASTRODUODENOSCOPY (EGD) WITH PROPOFOL  N/A 02/15/2015   Procedure: ESOPHAGOGASTRODUODENOSCOPY (EGD) WITH PROPOFOL ;  Surgeon: [name], MD;  Location: [facility] ENDOSCOPY;  Service: Endoscopy;  Laterality: N/A;   ESOPHAGOGASTRODUODENOSCOPY (EGD) WITH PROPOFOL  N/A 06/10/2017   Procedure: ESOPHAGOGASTRODUODENOSCOPY (EGD) WITH PROPOFOL ;  Surgeon: [name], MD;  Location: [facility] ENDOSCOPY;  Service: Endoscopy;  Laterality: N/A;   ESOPHAGOGASTRODUODENOSCOPY (EGD) WITH PROPOFOL  N/A 08/16/2017   Procedure: ESOPHAGOGASTRODUODENOSCOPY (EGD) WITH PROPOFOL ;  Surgeon: [name], MD;  Location: [facility] ENDOSCOPY;  Service: Endoscopy;  Laterality: N/A;   ESOPHAGOGASTRODUODENOSCOPY (EGD) WITH PROPOFOL  N/A 01/31/2021   Procedure: ESOPHAGOGASTRODUODENOSCOPY (EGD) WITH PROPOFOL ;  Surgeon: [name], MD;  Location: [facility] ENDOSCOPY;  Service: Endoscopy;  Laterality: N/A;   EYE SURGERY     GLAUCOMA SURGERY Left 1990's   [facility]   HEMORROIDECTOMY  1980's   [facility]   HERNIA REPAIR     [facility], Dr [name] HERNIA REPAIR Bilateral [phone]   LARYNX SURGERY  1990   3 times   MOHS SURGERY  01/22/2022   [facility]   REPLACEMENT TOTAL KNEE Right    RETINAL DETACHMENT SURGERY Right    TONSILLECTOMY     turp  2013   URETER SURGERY  2013   Dr [name]    Home  Medications: Prior to Admission medications   Medication Sig Start Date End Date Taking? Authorizing Provider  acetaminophen  (TYLENOL ) 500 MG tablet Take 1,000 mg by mouth 2 (two) times daily as needed for moderate pain (pain score 4-6).    [provider]  apixaban (ELIQUIS) 5 MG TABS tablet Take 5 mg by mouth 2 (two) times daily.    [provider]  brimonidine-timolol (COMBIGAN) 0.2-0.5 % ophthalmic solution Place 1 drop into the left eye 2 (two) times daily.    [provider]  diclofenac  Sodium (VOLTAREN ) 1 % GEL Apply 4 g topically 4 (four) times daily. 07/23/23   Sowles, Krichna, MD  diltiazem (CARDIZEM CD) 120 MG 24 hr capsule Take 120 mg by mouth daily.    [provider]  dorzolamide (TRUSOPT) 2 % ophthalmic solution Place 1 drop into the left eye 2 (two) times daily. 10/23/17   Porfilio, Sammie Crigler, MD  empagliflozin (JARDIANCE) 25 MG TABS tablet Take 25 mg by mouth daily.    [provider]  Exenatide  ER (BYDUREON  BCISE) 2 MG/0.85ML AUIJ Inject 2 mg into the skin every Sunday. 10/03/19   Berton Brock, MD  finasteride  (PROSCAR ) 5 MG tablet Take 1 tablet by mouth once daily 06/29/23   Sowles, Krichna, MD  flecainide (TAMBOCOR) 50 MG tablet Take 100 mg by mouth 2 (two) times daily.    [provider]  furosemide  (LASIX ) 20 MG tablet Take 1 tablet (20 mg total) by mouth daily. 10/14/23   Rockney Cid, DO  glipiZIDE (GLUCOTROL) 10 MG tablet Take 10 mg by mouth daily. 12/04/22   Berton Brock, MD  hydrocortisone  2.5 % cream APPLY TO THE AFFECTED AREA OF FACE AND EARS EVERY OTHER NIGHT ALTERNATING WITH KETOCONAZOLE  CREAM 10/20/23   Elta Halter, MD  insulin degludec (TRESIBA FLEXTOUCH) 200 UNIT/ML FlexTouch Pen Inject 16 Units into the skin in the morning. 02/06/21   Berton Brock, MD  ketoconazole  (NIZORAL ) 2 % cream Apply two grams to the face every other day alternating with hydrocortisone  2.5% cream every other day.  10/20/23   Elta Halter, MD  ketoconazole  (NIZORAL ) 2 % shampoo Apply 1 Application topically once a week. Massea 10/20/23   Elta Halter, MD  loperamide (IMODIUM A-D) 2 MG tablet Take 2 mg by mouth as needed for diarrhea or loose stools.    [provider]  loratadine (CLARITIN) 10 MG tablet Take 10 mg by mouth daily.    [provider]  metFORMIN  (GLUCOPHAGE ) 1000 MG tablet Take 1,000 mg by mouth 2 (two) times daily with a meal. On Monday and Wednesday    Berton Brock, MD  mometasone  (ELOCON ) 0.1 % lotion Apply topically daily as needed (itchy scalp). Apply to itchy areas of scalp QD- BID PRN 10/20/23   Elta Halter, MD  Multiple Vitamin (MULTIVITAMIN) tablet Take 1 tablet by mouth daily.    [provider]  omega-3 acid ethyl esters (LOVAZA ) 1 g capsule Take 2 capsules (2 g  total) by mouth 2 (two) times daily. 05/10/23   Sowles, Krichna, MD  omeprazole  (PRILOSEC) 20 MG capsule Take 1 capsule by mouth once daily 11/04/23   Sowles, Krichna, MD  rosuvastatin  (CRESTOR ) 10 MG tablet TAKE 1 TABLET BY MOUTH AT BEDTIME 11/30/23   Sowles, Krichna, MD  tadalafil  (CIALIS ) 20 MG tablet Take 1 tablet (20 mg total) by mouth daily as needed for erectile dysfunction. 08/12/23   Sowles, Krichna, MD  telmisartan -hydrochlorothiazide  (MICARDIS  HCT) 80-12.5 MG tablet Take 1 tablet by mouth daily. 10/26/23   Sowles, Krichna, MD    Allergies: Allergies  Allergen Reactions   Demerol [Meperidine] Other (See Comments) and Anaphylaxis    MAKES BLOOD PRESSURE BOTTOM OUT hypotension   Metoprolol Shortness Of Breath   Dilaudid [Hydromorphone Hcl]     MAKES BLOOD PRESSURE BOTTOM OUT   Actos [Pioglitazone] Other (See Comments) and Hives   Dapagliflozin Rash   Penicillins Rash    Review of Systems: Constitutional:  Negative for chills and fever.  Respiratory:  Negative for cough.   Cardiovascular:  Negative for chest pain and palpitations.  Gastrointestinal:  Negative for  nausea and vomiting.  Skin:  Negative for itching and rash.       + LLE Wound (Chronic, improving)  All other systems reviewed and are negative.   Physical Exam BP 124/79   Pulse 82   Temp 98.6 F (37 C) (Oral)   Ht 6' 3.5 (1.918 m)   Wt 196 lb (88.9 kg)   SpO2 96%   BMI 24.17 kg/m   Physical Exam Vitals and nursing note reviewed. Exam conducted with a chaperone present.  Constitutional:      General: He is not in acute distress.    Appearance: Normal appearance. He is normal weight. He is not ill-appearing.     Comments: Sitting up on exam table, NAD  HENT:     Head: Normocephalic and atraumatic.    Eyes:     General: No scleral icterus.    Conjunctiva/sclera: Conjunctivae normal.     Comments: Wearing glasses   Pulmonary:     Effort: Pulmonary effort is normal. No respiratory distress.  Genitourinary:    Comments: Deferred   Skin:    General: Skin is warm and dry.     Findings: Wound present.     Comments: To the LLE, anterior shin, there is a well demarcated eschar, this is significantly improved, shrinking in size, there is no erythema, no swelling, no drainage. No evidence of active infection, abscess, no necrosis.     Neurological:     General: No focal deficit present.     Mental Status: He is alert and oriented to person, place, and time.    Psychiatric:        Mood and Affect: Mood normal.        Behavior: Behavior normal.     Labs/Imaging: No new labs or imaging   Assessment and Plan: This is a 88 y.o. male with chronic, healing, LLE wound/eschar               - Wound appears to be improving slowly. I do not appreciate a need for urgent debridement nor additional antibiotics at this time.              - He can continue wound care which I preformed today. Applied silvadene to coat eschar, non-adherent dressing applied on top of this. The leg was then wrapped. He will maintain this dressing with plans to  change next week             - Appreciate  vascular assistance; follow up venous study on 06/20             - Appreciate wound care center availability. He establishes there on 06/23. I do think this will be his best resource moving forward once he is established.              - We will be happy to remain a resource for him as needed. Please do not hesitate to call.    Face-to-face time spent with the patient and care providers was 20 minutes, with more than 50% of the time spent counseling, educating, and coordinating care of the patient.     Apolonio Bay, PA-C [facility] 12/01/2023, 2:39 PM M-F: 7am - 4pm

## 2023-12-01 NOTE — Patient Instructions (Signed)
  Please call and ask to speak with a nurse if you develop questions or concerns.

## 2023-12-02 DIAGNOSIS — I152 Hypertension secondary to endocrine disorders: Secondary | ICD-10-CM | POA: Diagnosis not present

## 2023-12-02 DIAGNOSIS — Z8673 Personal history of transient ischemic attack (TIA), and cerebral infarction without residual deficits: Secondary | ICD-10-CM | POA: Diagnosis not present

## 2023-12-02 DIAGNOSIS — Z800 Family history of malignant neoplasm of digestive organs: Secondary | ICD-10-CM | POA: Diagnosis not present

## 2023-12-02 DIAGNOSIS — Z9889 Other specified postprocedural states: Secondary | ICD-10-CM | POA: Diagnosis not present

## 2023-12-02 DIAGNOSIS — Z860101 Personal history of adenomatous and serrated colon polyps: Secondary | ICD-10-CM | POA: Diagnosis not present

## 2023-12-02 DIAGNOSIS — D126 Benign neoplasm of colon, unspecified: Secondary | ICD-10-CM | POA: Diagnosis not present

## 2023-12-02 DIAGNOSIS — K219 Gastro-esophageal reflux disease without esophagitis: Secondary | ICD-10-CM | POA: Diagnosis not present

## 2023-12-02 DIAGNOSIS — Z8679 Personal history of other diseases of the circulatory system: Secondary | ICD-10-CM | POA: Diagnosis not present

## 2023-12-02 DIAGNOSIS — E1142 Type 2 diabetes mellitus with diabetic polyneuropathy: Secondary | ICD-10-CM | POA: Diagnosis not present

## 2023-12-02 DIAGNOSIS — K766 Portal hypertension: Secondary | ICD-10-CM | POA: Diagnosis not present

## 2023-12-02 DIAGNOSIS — E1159 Type 2 diabetes mellitus with other circulatory complications: Secondary | ICD-10-CM | POA: Diagnosis not present

## 2023-12-02 DIAGNOSIS — Z7901 Long term (current) use of anticoagulants: Secondary | ICD-10-CM | POA: Diagnosis not present

## 2023-12-03 ENCOUNTER — Ambulatory Visit (INDEPENDENT_AMBULATORY_CARE_PROVIDER_SITE_OTHER)

## 2023-12-03 DIAGNOSIS — L97929 Non-pressure chronic ulcer of unspecified part of left lower leg with unspecified severity: Secondary | ICD-10-CM | POA: Diagnosis not present

## 2023-12-06 ENCOUNTER — Encounter: Attending: Physician Assistant | Admitting: Physician Assistant

## 2023-12-06 DIAGNOSIS — I480 Paroxysmal atrial fibrillation: Secondary | ICD-10-CM | POA: Insufficient documentation

## 2023-12-06 DIAGNOSIS — I10 Essential (primary) hypertension: Secondary | ICD-10-CM | POA: Insufficient documentation

## 2023-12-06 DIAGNOSIS — Z794 Long term (current) use of insulin: Secondary | ICD-10-CM | POA: Diagnosis not present

## 2023-12-06 DIAGNOSIS — L97822 Non-pressure chronic ulcer of other part of left lower leg with fat layer exposed: Secondary | ICD-10-CM | POA: Insufficient documentation

## 2023-12-06 DIAGNOSIS — E11622 Type 2 diabetes mellitus with other skin ulcer: Secondary | ICD-10-CM | POA: Diagnosis present

## 2023-12-06 DIAGNOSIS — Z7901 Long term (current) use of anticoagulants: Secondary | ICD-10-CM | POA: Diagnosis not present

## 2023-12-06 DIAGNOSIS — Z7984 Long term (current) use of oral hypoglycemic drugs: Secondary | ICD-10-CM | POA: Diagnosis not present

## 2023-12-07 ENCOUNTER — Ambulatory Visit (INDEPENDENT_AMBULATORY_CARE_PROVIDER_SITE_OTHER): Admitting: Vascular Surgery

## 2023-12-07 ENCOUNTER — Encounter (INDEPENDENT_AMBULATORY_CARE_PROVIDER_SITE_OTHER): Payer: Self-pay | Admitting: Vascular Surgery

## 2023-12-07 VITALS — BP 117/69 | HR 83 | Ht 75.5 in | Wt 194.0 lb

## 2023-12-07 DIAGNOSIS — E782 Mixed hyperlipidemia: Secondary | ICD-10-CM | POA: Diagnosis not present

## 2023-12-07 DIAGNOSIS — Z794 Long term (current) use of insulin: Secondary | ICD-10-CM

## 2023-12-07 DIAGNOSIS — E1129 Type 2 diabetes mellitus with other diabetic kidney complication: Secondary | ICD-10-CM

## 2023-12-07 DIAGNOSIS — R809 Proteinuria, unspecified: Secondary | ICD-10-CM

## 2023-12-07 DIAGNOSIS — I152 Hypertension secondary to endocrine disorders: Secondary | ICD-10-CM

## 2023-12-07 DIAGNOSIS — E1159 Type 2 diabetes mellitus with other circulatory complications: Secondary | ICD-10-CM | POA: Diagnosis not present

## 2023-12-07 DIAGNOSIS — L97929 Non-pressure chronic ulcer of unspecified part of left lower leg with unspecified severity: Secondary | ICD-10-CM

## 2023-12-07 NOTE — Assessment & Plan Note (Signed)
 Venous reflux study recently done showed no evidence of deep venous thrombosis, superficial thrombophlebitis, or significant venous reflux in the left lower extremity.  His wound has gone on to heal with the excellent local wound care from Dr. Jordis.  His swelling is very mild at this point he is going to get into compression socks soon.  I will see him back as needed.

## 2023-12-07 NOTE — Progress Notes (Signed)
 MRN : [id number]  Jeremy [name] is a 88 y.o. (02/07/1936) male who presents with chief complaint of  Chief Complaint  Patient presents with   Follow-up     fu L Reflux study see JD    .  History of Present Illness: Patient returns today in follow up of left lower extremity ulceration after his venous reflux study last week.  His ulcer has healed with excellent local wound care from his general surgeon.  His swelling at this point is very mild.  He is wearing the diabetic socks and plans to switch to compression socks in 1 to 2 weeks.  His swelling is very mild and infrequent at this point. Venous reflux study recently done showed no evidence of deep venous thrombosis, superficial thrombophlebitis, or significant venous reflux in the left lower extremity.  Current Outpatient Medications  Medication Sig Dispense Refill   acetaminophen  (TYLENOL ) 500 MG tablet Take 1,000 mg by mouth 2 (two) times daily as needed for moderate pain (pain score 4-6).     apixaban (ELIQUIS) 5 MG TABS tablet Take 5 mg by mouth 2 (two) times daily.     brimonidine-timolol (COMBIGAN) 0.2-0.5 % ophthalmic solution Place 1 drop into the left eye 2 (two) times daily.     diclofenac  Sodium (VOLTAREN ) 1 % GEL Apply 4 g topically 4 (four) times daily. 300 g 1   diltiazem (CARDIZEM CD) 120 MG 24 hr capsule Take 120 mg by mouth daily.     dorzolamide (TRUSOPT) 2 % ophthalmic solution Place 1 drop into the left eye 2 (two) times daily.  3   empagliflozin (JARDIANCE) 25 MG TABS tablet Take 25 mg by mouth daily.     Exenatide  ER (BYDUREON  BCISE) 2 MG/0.85ML AUIJ Inject 2 mg into the skin every Sunday.     finasteride  (PROSCAR ) 5 MG tablet Take 1 tablet by mouth once daily 90 tablet 1   flecainide (TAMBOCOR) 50 MG tablet Take 100 mg by mouth 2 (two) times daily.     furosemide  (LASIX ) 20 MG tablet Take 1 tablet (20 mg total) by mouth daily. 6 tablet 0   glipiZIDE (GLUCOTROL) 10 MG tablet Take 10 mg by mouth daily.      hydrocortisone  2.5 % cream APPLY TO THE AFFECTED AREA OF FACE AND EARS EVERY OTHER NIGHT ALTERNATING WITH KETOCONAZOLE  CREAM 28 g 11   insulin degludec (TRESIBA FLEXTOUCH) 200 UNIT/ML FlexTouch Pen Inject 16 Units into the skin in the morning.     ketoconazole  (NIZORAL ) 2 % cream Apply two grams to the face every other day alternating with hydrocortisone  2.5% cream every other day. 60 g 11   ketoconazole  (NIZORAL ) 2 % shampoo Apply 1 Application topically once a week. Massea 120 mL 11   loperamide (IMODIUM A-D) 2 MG tablet Take 2 mg by mouth as needed for diarrhea or loose stools.     loratadine (CLARITIN) 10 MG tablet Take 10 mg by mouth daily.     metFORMIN  (GLUCOPHAGE ) 1000 MG tablet Take 1,000 mg by mouth 2 (two) times daily with a meal. On Monday and Wednesday     mometasone  (ELOCON ) 0.1 % lotion Apply topically daily as needed (itchy scalp). Apply to itchy areas of scalp QD- BID PRN 60 mL 5   Multiple Vitamin (MULTIVITAMIN) tablet Take 1 tablet by mouth daily.     omega-3 acid ethyl esters (LOVAZA ) 1 g capsule Take 2 capsules (2 g total) by mouth 2 (two) times daily. 480 capsule 3  omeprazole  (PRILOSEC) 20 MG capsule Take 1 capsule by mouth once daily 90 capsule 0   rosuvastatin  (CRESTOR ) 10 MG tablet TAKE 1 TABLET BY MOUTH AT BEDTIME 90 tablet 0   tadalafil  (CIALIS ) 20 MG tablet Take 1 tablet (20 mg total) by mouth daily as needed for erectile dysfunction. 90 tablet 0   telmisartan -hydrochlorothiazide  (MICARDIS  HCT) 80-12.5 MG tablet Take 1 tablet by mouth daily. 90 tablet 1   No current facility-administered medications for this visit.    Past Medical History:  Diagnosis Date   Allergy    Basal cell carcinoma 10/09/2021   right medial forehead at glabella, Mohs 01/22/22   Chronic kidney disease    Diabetes mellitus without complication (HCC)    Diabetic neuropathy (HCC)    Diverticula, colon 1980's   Diverticulosis    Enlarged prostate    GERD (gastroesophageal reflux disease)     Glaucoma    Hemorrhoids    High cholesterol    History of hiatal hernia    Hyperlipidemia    Hypertension    Obesity    Onychomycosis    Seizures (HCC)    Small bowel obstruction (HCC) 1990's   Urination disorder     Past Surgical History:  Procedure Laterality Date   APPENDECTOMY     BLADDER DIVERTICULECTOMY  2013   CARDIOVERSION N/A 02/24/2023   Procedure: CARDIOVERSION;  Surgeon: Ammon Blunt, MD;  Location: [facility] ORS;  Service: Cardiovascular;  Laterality: N/A;   CARDIOVERSION N/A 04/21/2023   Procedure: CARDIOVERSION;  Surgeon: Ammon Blunt, MD;  Location: [facility];  Service: Cardiovascular;  Laterality: N/A;   CARPAL TUNNEL RELEASE  01/2022   revision right side   COLON SURGERY Left 1980's   COLONOSCOPY W/ POLYPECTOMY  1990's   Dr Rollene   COLONOSCOPY WITH PROPOFOL  N/A 12/21/2014   Procedure: COLONOSCOPY WITH PROPOFOL ;  Surgeon: Gladis RAYMOND Mariner, MD;  Location: [facility] ENDOSCOPY;  Service: Endoscopy;  Laterality: N/A;   COLONOSCOPY WITH PROPOFOL  N/A 08/16/2017   Procedure: COLONOSCOPY WITH PROPOFOL ;  Surgeon: Mariner Gladis RAYMOND, MD;  Location: [facility] ENDOSCOPY;  Service: Endoscopy;  Laterality: N/A;   COLONOSCOPY WITH PROPOFOL  N/A 01/31/2021   Procedure: COLONOSCOPY WITH PROPOFOL ;  Surgeon: Dessa Reyes ORN, MD;  Location: [facility] ENDOSCOPY;  Service: Endoscopy;  Laterality: N/A;   ESOPHAGOGASTRODUODENOSCOPY (EGD) WITH PROPOFOL  N/A 02/15/2015   Procedure: ESOPHAGOGASTRODUODENOSCOPY (EGD) WITH PROPOFOL ;  Surgeon: Gladis RAYMOND Mariner, MD;  Location: [facility] ENDOSCOPY;  Service: Endoscopy;  Laterality: N/A;   ESOPHAGOGASTRODUODENOSCOPY (EGD) WITH PROPOFOL  N/A 06/10/2017   Procedure: ESOPHAGOGASTRODUODENOSCOPY (EGD) WITH PROPOFOL ;  Surgeon: Mariner Gladis RAYMOND, MD;  Location: [facility] ENDOSCOPY;  Service: Endoscopy;  Laterality: N/A;   ESOPHAGOGASTRODUODENOSCOPY (EGD) WITH PROPOFOL  N/A 08/16/2017   Procedure: ESOPHAGOGASTRODUODENOSCOPY (EGD) WITH PROPOFOL ;  Surgeon:  Mariner Gladis RAYMOND, MD;  Location: [facility] ENDOSCOPY;  Service: Endoscopy;  Laterality: N/A;   ESOPHAGOGASTRODUODENOSCOPY (EGD) WITH PROPOFOL  N/A 01/31/2021   Procedure: ESOPHAGOGASTRODUODENOSCOPY (EGD) WITH PROPOFOL ;  Surgeon: Dessa Reyes ORN, MD;  Location: [facility] ENDOSCOPY;  Service: Endoscopy;  Laterality: N/A;   EYE SURGERY     GLAUCOMA SURGERY Left 1990's   [facility]   HEMORROIDECTOMY  1980's   [facility]   HERNIA REPAIR     [facility], Dr Rollene FONTANA HERNIA REPAIR Bilateral [phone]   LARYNX SURGERY  1990   3 times   MOHS SURGERY  01/22/2022   Dr. Timm - [facility]   REPLACEMENT TOTAL KNEE Right    RETINAL DETACHMENT SURGERY Right    TONSILLECTOMY  turp  2013   URETER SURGERY  2013   Dr Ike     Social History   Tobacco Use   Smoking status: Never    Passive exposure: Never   Smokeless tobacco: Never   Tobacco comments:    smoking cessation materials not required  Vaping Use   Vaping status: Never Used  Substance Use Topics   Alcohol use: No   Drug use: No      Family History  Problem Relation Age of Onset   Diabetes Mother    Cataracts Mother    Metabolic syndrome Mother    Blindness Mother        r/t diabetes   Cataracts Father    Glaucoma Father        Retina-Detachment   Atrial fibrillation Father    Diverticulitis Sister    Glaucoma Brother    Diabetes Brother    Cancer Sister        Gallbladder   Cancer Daughter      Allergies  Allergen Reactions   Demerol [Meperidine] Other (See Comments) and Anaphylaxis    MAKES BLOOD PRESSURE BOTTOM OUT hypotension   Metoprolol Shortness Of Breath   Dilaudid [Hydromorphone Hcl]     MAKES BLOOD PRESSURE BOTTOM OUT   Actos [Pioglitazone] Other (See Comments) and Hives   Dapagliflozin Rash   Penicillins Rash     REVIEW OF SYSTEMS (Negative unless checked)   Constitutional: [x] Weight loss  [] Fever  [] Chills Cardiac: [] Chest pain   [] Chest pressure   [] Palpitations   [] Shortness of  breath when laying flat   [] Shortness of breath at rest   [] Shortness of breath with exertion. Vascular:  [] Pain in legs with walking   [] Pain in legs at rest   [] Pain in legs when laying flat   [] Claudication   [] Pain in feet when walking  [] Pain in feet at rest  [] Pain in feet when laying flat   [] History of DVT   [] Phlebitis   [] Swelling in legs   [] Varicose veins   [x] Non-healing ulcers Pulmonary:   [] Uses home oxygen   [] Productive cough   [] Hemoptysis   [] Wheeze  [] COPD   [] Asthma Neurologic:  [] Dizziness  [] Blackouts   [x] Seizures   [] History of stroke   [] History of TIA  [] Aphasia   [] Temporary blindness   [] Dysphagia   [] Weakness or numbness in arms   [] Weakness or numbness in legs Musculoskeletal:  [x] Arthritis   [] Joint swelling   [] Joint pain   [] Low back pain Hematologic:  [] Easy bruising  [] Easy bleeding   [] Hypercoagulable state   [] Anemic  [] Hepatitis Gastrointestinal:  [] Blood in stool   [] Vomiting blood  [x] Gastroesophageal reflux/heartburn   [] Abdominal pain Genitourinary:  [] Chronic kidney disease   [] Difficult urination  [] Frequent urination  [] Burning with urination   [] Hematuria Skin:  [] Rashes   [x] Ulcers   [x] Wounds Psychological:  [] History of anxiety   []  History of major depression.  Physical Examination  BP 117/69   Pulse 83   Ht 6' 3.5 (1.918 m)   Wt 194 lb (88 kg)   BMI 23.93 kg/m  Gen:  WD/WN, NAD Head: /AT, No temporalis wasting. Ear/Nose/Throat: Hearing grossly intact, nares w/o erythema or drainage Eyes: Conjunctiva clear. Sclera non-icteric Neck: Supple.  Trachea midline Pulmonary:  Good air movement, no use of accessory muscles.  Cardiac: RRR, no JVD Vascular:  Vessel Right Left  Radial Palpable Palpable           Musculoskeletal: M/S 5/5 throughout.  No  deformity or atrophy. Trace LE edema. Neurologic: Sensation grossly intact in extremities.  Symmetrical.  Speech is fluent.  Psychiatric: Judgment intact, Mood & affect appropriate for pt's  clinical situation. Dermatologic: No rashes or ulcers noted.  No cellulitis or open wounds. Previous left leg ulcer has healed.      Labs Recent Results (from the past 2160 hours)  Wound culture     Status: Abnormal   Collection Time: 10/28/23  3:52 PM   Specimen: Wound  Result Value Ref Range   MICRO NUMBER: [id number]    SPECIMEN QUALITY: Adequate    SOURCE: WOUND (SITE NOT SPECIFIED)    STATUS: FINAL    GRAM STAIN:      No epithelial cells seen No white blood cells seen Few Gram positive cocci in pairs Rare Gram positive bacilli   ISOLATE 1: Staphylococcus aureus (A)     Comment: Heavy growth of Staphylococcus aureus      Susceptibility   Staphylococcus aureus - AEROBIC CULT, GRAM STAIN POSITIVE 1    VANCOMYCIN 1 Sensitive     CIPROFLOXACIN  <=0.5 Sensitive     CLINDAMYCIN <=0.25 Sensitive     LEVOFLOXACIN  <=0.12 Sensitive     ERYTHROMYCIN <=0.25 Sensitive     GENTAMICIN <=0.5 Sensitive     OXACILLIN* 0.5 Sensitive      * Oxacillin susceptible staphylococci are susceptible to other penicillinase-stable penicillins (e.g., methicillin, nafcillin), beta- lactam/beta-lactamase inhibitor combinations, and cephems with staphylococcal indications, including cefazolin.     TETRACYCLINE <=1 Sensitive     TRIMETH/SULFA <=10 Sensitive     MOXIFLOXACIN * <=0.25 Sensitive      * Oxacillin susceptible staphylococci are susceptible to other penicillinase-stable penicillins (e.g., methicillin, nafcillin), beta- lactam/beta-lactamase inhibitor combinations, and cephems with staphylococcal indications, including cefazolin. Legend: S = Susceptible  I = Intermediate R = Resistant  NS = Not susceptible SDD = Susceptible Dose Dependent * = Not Tested  NR = Not Reported **NN = See Therapy Comments   HM DIABETES EYE EXAM     Status: Abnormal   Collection Time: 11/04/23  4:38 PM  Result Value Ref Range   HM Diabetic Eye Exam Retinopathy (A) No Retinopathy    Comment: Abstracted by HIM     Radiology VAS US  LOWER EXTREMITY VENOUS REFLUX Result Date: 12/07/2023  Lower Venous Reflux Study Patient Name:  DR. ZACHARY FORBES ORES  Date of Exam:   12/03/2023 Medical Rec #: [id number]            Accession #:    [id number] Date of Birth: 1936-05-23           Patient Gender: M Patient Age:   34 years Exam Location:  [facility] Procedure:      VAS US  LOWER EXTREMITY VENOUS REFLUX Referring Phys: SELINDA GU --------------------------------------------------------------------------------  Indications: Swelling.  Performing Technologist: Leafy Gibes RVS  Examination Guidelines: A complete evaluation includes B-mode imaging, spectral Doppler, color Doppler, and power Doppler as needed of all accessible portions of each vessel. Bilateral testing is considered an integral part of a complete examination. Limited examinations for reoccurring indications may be performed as noted. The reflux portion of the exam is performed with the patient in reverse Trendelenburg. Significant venous reflux is defined as >500 ms in the superficial venous system, and >1 second in the deep venous system.  +--------------+---------+------+-----------+------------+--------+ LEFT          Reflux NoRefluxReflux TimeDiameter cmsComments  Yes                                  +--------------+---------+------+-----------+------------+--------+ CFV           no                                             +--------------+---------+------+-----------+------------+--------+ FV prox       no                                             +--------------+---------+------+-----------+------------+--------+ FV mid        no                                             +--------------+---------+------+-----------+------------+--------+ FV dist       no                                             +--------------+---------+------+-----------+------------+--------+ Popliteal      no                                             +--------------+---------+------+-----------+------------+--------+ GSV at SFJ    no                                             +--------------+---------+------+-----------+------------+--------+ GSV prox thighno                                             +--------------+---------+------+-----------+------------+--------+ GSV mid thigh no                                             +--------------+---------+------+-----------+------------+--------+ GSV dist thighno                                             +--------------+---------+------+-----------+------------+--------+ GSV at knee   no                                             +--------------+---------+------+-----------+------------+--------+ GSV prox calf no                                             +--------------+---------+------+-----------+------------+--------+  Summary: Left: - No evidence of deep vein thrombosis seen in the left lower extremity, from the common femoral through the popliteal veins. - No evidence of superficial venous thrombosis in the left lower extremity. - There is no evidence of venous reflux seen in the left lower extremity. - No evidence of superficial venous reflux seen in the left greater saphenous vein. - No evidence of superficial venous reflux seen in the left short saphenous vein.  *See table(s) above for measurements and observations. Electronically signed by Selinda Gu MD on 12/07/2023 at 2:57:19 PM.    Final     Assessment/Plan  Skin ulcer of left lower leg (HCC) Venous reflux study recently done showed no evidence of deep venous thrombosis, superficial thrombophlebitis, or significant venous reflux in the left lower extremity.  His wound has gone on to heal with the excellent local wound care from Dr. Jordis.  His swelling is very mild at this point he is going to get into compression socks soon.  I will see him back  as needed.  Type 2 diabetes mellitus with microalbuminuria, with long-term current use of insulin (HCC) blood glucose control important in reducing the progression of atherosclerotic disease. Also, involved in wound healing. On appropriate medications.     Hyperlipidemia due to type 2 diabetes mellitus (HCC) lipid control important in reducing the progression of atherosclerotic disease. Continue statin therapy     Hypertension, benign blood pressure control important in reducing the progression of atherosclerotic disease. On appropriate oral medications.  Selinda Gu, MD  12/07/2023 3:34 PM    This note was created with Dragon medical transcription system.  Any errors from dictation are purely unintentional

## 2023-12-08 DIAGNOSIS — R278 Other lack of coordination: Secondary | ICD-10-CM | POA: Diagnosis not present

## 2023-12-08 DIAGNOSIS — Z96651 Presence of right artificial knee joint: Secondary | ICD-10-CM | POA: Diagnosis not present

## 2023-12-08 DIAGNOSIS — M7072 Other bursitis of hip, left hip: Secondary | ICD-10-CM | POA: Diagnosis not present

## 2023-12-08 DIAGNOSIS — R2689 Other abnormalities of gait and mobility: Secondary | ICD-10-CM | POA: Diagnosis not present

## 2023-12-08 DIAGNOSIS — M6281 Muscle weakness (generalized): Secondary | ICD-10-CM | POA: Diagnosis not present

## 2023-12-10 DIAGNOSIS — M6281 Muscle weakness (generalized): Secondary | ICD-10-CM | POA: Diagnosis not present

## 2023-12-10 DIAGNOSIS — R2689 Other abnormalities of gait and mobility: Secondary | ICD-10-CM | POA: Diagnosis not present

## 2023-12-10 DIAGNOSIS — Z96651 Presence of right artificial knee joint: Secondary | ICD-10-CM | POA: Diagnosis not present

## 2023-12-10 DIAGNOSIS — R278 Other lack of coordination: Secondary | ICD-10-CM | POA: Diagnosis not present

## 2023-12-10 DIAGNOSIS — M7072 Other bursitis of hip, left hip: Secondary | ICD-10-CM | POA: Diagnosis not present

## 2023-12-13 DIAGNOSIS — M7072 Other bursitis of hip, left hip: Secondary | ICD-10-CM | POA: Diagnosis not present

## 2023-12-13 DIAGNOSIS — Z96651 Presence of right artificial knee joint: Secondary | ICD-10-CM | POA: Diagnosis not present

## 2023-12-13 DIAGNOSIS — R2689 Other abnormalities of gait and mobility: Secondary | ICD-10-CM | POA: Diagnosis not present

## 2023-12-13 DIAGNOSIS — R278 Other lack of coordination: Secondary | ICD-10-CM | POA: Diagnosis not present

## 2023-12-13 DIAGNOSIS — M6281 Muscle weakness (generalized): Secondary | ICD-10-CM | POA: Diagnosis not present

## 2023-12-15 DIAGNOSIS — R2689 Other abnormalities of gait and mobility: Secondary | ICD-10-CM | POA: Diagnosis not present

## 2023-12-15 DIAGNOSIS — M7072 Other bursitis of hip, left hip: Secondary | ICD-10-CM | POA: Diagnosis not present

## 2023-12-15 DIAGNOSIS — M6281 Muscle weakness (generalized): Secondary | ICD-10-CM | POA: Diagnosis not present

## 2023-12-15 DIAGNOSIS — R278 Other lack of coordination: Secondary | ICD-10-CM | POA: Diagnosis not present

## 2023-12-15 DIAGNOSIS — Z96651 Presence of right artificial knee joint: Secondary | ICD-10-CM | POA: Diagnosis not present

## 2023-12-17 ENCOUNTER — Other Ambulatory Visit: Payer: Self-pay | Admitting: Family Medicine

## 2023-12-17 DIAGNOSIS — M7072 Other bursitis of hip, left hip: Secondary | ICD-10-CM | POA: Diagnosis not present

## 2023-12-17 DIAGNOSIS — Z96651 Presence of right artificial knee joint: Secondary | ICD-10-CM | POA: Diagnosis not present

## 2023-12-17 DIAGNOSIS — R2689 Other abnormalities of gait and mobility: Secondary | ICD-10-CM | POA: Diagnosis not present

## 2023-12-17 DIAGNOSIS — M6281 Muscle weakness (generalized): Secondary | ICD-10-CM | POA: Diagnosis not present

## 2023-12-17 DIAGNOSIS — R278 Other lack of coordination: Secondary | ICD-10-CM | POA: Diagnosis not present

## 2023-12-17 DIAGNOSIS — E1169 Type 2 diabetes mellitus with other specified complication: Secondary | ICD-10-CM

## 2023-12-20 DIAGNOSIS — M6281 Muscle weakness (generalized): Secondary | ICD-10-CM | POA: Diagnosis not present

## 2023-12-20 DIAGNOSIS — Z96651 Presence of right artificial knee joint: Secondary | ICD-10-CM | POA: Diagnosis not present

## 2023-12-20 DIAGNOSIS — M7072 Other bursitis of hip, left hip: Secondary | ICD-10-CM | POA: Diagnosis not present

## 2023-12-20 DIAGNOSIS — R2689 Other abnormalities of gait and mobility: Secondary | ICD-10-CM | POA: Diagnosis not present

## 2023-12-20 DIAGNOSIS — R278 Other lack of coordination: Secondary | ICD-10-CM | POA: Diagnosis not present

## 2023-12-21 ENCOUNTER — Ambulatory Visit: Payer: Self-pay | Admitting: Family Medicine

## 2023-12-21 ENCOUNTER — Encounter: Payer: Self-pay | Admitting: Family Medicine

## 2023-12-21 VITALS — BP 110/68 | HR 88 | Resp 16 | Ht 75.5 in | Wt 195.7 lb

## 2023-12-21 DIAGNOSIS — E538 Deficiency of other specified B group vitamins: Secondary | ICD-10-CM | POA: Diagnosis not present

## 2023-12-21 DIAGNOSIS — E1159 Type 2 diabetes mellitus with other circulatory complications: Secondary | ICD-10-CM | POA: Diagnosis not present

## 2023-12-21 DIAGNOSIS — I152 Hypertension secondary to endocrine disorders: Secondary | ICD-10-CM

## 2023-12-21 DIAGNOSIS — N400 Enlarged prostate without lower urinary tract symptoms: Secondary | ICD-10-CM

## 2023-12-21 DIAGNOSIS — D696 Thrombocytopenia, unspecified: Secondary | ICD-10-CM | POA: Diagnosis not present

## 2023-12-21 DIAGNOSIS — Z800 Family history of malignant neoplasm of digestive organs: Secondary | ICD-10-CM

## 2023-12-21 DIAGNOSIS — I700 Atherosclerosis of aorta: Secondary | ICD-10-CM | POA: Diagnosis not present

## 2023-12-21 DIAGNOSIS — K2930 Chronic superficial gastritis without bleeding: Secondary | ICD-10-CM

## 2023-12-21 DIAGNOSIS — I482 Chronic atrial fibrillation, unspecified: Secondary | ICD-10-CM | POA: Diagnosis not present

## 2023-12-21 DIAGNOSIS — E1169 Type 2 diabetes mellitus with other specified complication: Secondary | ICD-10-CM | POA: Diagnosis not present

## 2023-12-21 DIAGNOSIS — Z7901 Long term (current) use of anticoagulants: Secondary | ICD-10-CM

## 2023-12-21 DIAGNOSIS — E785 Hyperlipidemia, unspecified: Secondary | ICD-10-CM | POA: Diagnosis not present

## 2023-12-21 DIAGNOSIS — I8500 Esophageal varices without bleeding: Secondary | ICD-10-CM

## 2023-12-21 MED ORDER — FINASTERIDE 5 MG PO TABS: 5.0000 mg | ORAL_TABLET | Freq: Every day | ORAL | 1 refills | Status: DC

## 2023-12-21 MED ORDER — ROSUVASTATIN CALCIUM 10 MG PO TABS: 10.0000 mg | ORAL_TABLET | Freq: Every day | ORAL | 1 refills | Status: AC

## 2023-12-21 MED ORDER — OMEPRAZOLE 20 MG PO CPDR: 20.0000 mg | DELAYED_RELEASE_CAPSULE | Freq: Every day | ORAL | 1 refills | Status: AC

## 2023-12-21 NOTE — Progress Notes (Signed)
 Name: Jeremy [name]   MRN: [id number]    DOB: [birth year]   Date:12/21/2023       Progress Note  Subjective  Chief Complaint  Chief Complaint  Patient presents with   Medical Management of Chronic Issues    Pt having colonoscopy tomorrow currently off from DM meds and Eliquis due to procedure, will restart after procedure   Discussed the use of AI scribe software for clinical note transcription with the patient, who gave verbal consent to proceed.  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  His leg wound, which was previously severe, has healed completely. Consultations with a surgeon and a vascular doctor led to the use of A and D ointment and wearing compression socks to prevent future issues.  He is scheduled for a colonoscopy due to his history of Lynch syndrome and previous high-grade polyps. He has no gastrointestinal symptoms such as stomach issues or blood in stools. Despite a history of esophageal varices, no portal hypertension or liver changes have been confirmed. His hematologist has cleared him for the procedure despite low platelets.  He has a history of atrial fibrillation and underwent an ablation. He is currently in normal sinus rhythm and is taking Eliquis, flecainide, and Cardizem for rhythm control. No palpitations or shortness of breath are reported.  His diabetes is managed with Mounjaro, metformin , and [place]. He was advised to hold metformin  and glipizide before the procedure and to take half the dose of insulin. No symptoms of hyperglycemia such as excessive hunger, thirst, or urination are present.  He has dyslipidemia and atherosclerosis of the aorta, for which he takes rosuvastatin  and Lovaza . His last cholesterol levels were good. He also takes telmisartan  and HCTZ for hypertension, which is well controlled.  He has mild anemia and thrombocytopenia,  with stable low platelets. He takes B12 supplements for B12 deficiency and reports no bleeding issues despite easy bruising.  He takes omeprazole  for gastritis and finasteride  for prostate health. He reports no urinary symptoms and his prostate medication helps with urination.    Patient Active Problem List   Diagnosis Date Noted   Anticoagulant long-term use 06/03/2023   Family history of Lynch syndrome 06/03/2023   Insomnia 03/11/2023   Anemia 03/11/2023   Hypertension, benign 03/11/2023   Type 2 diabetes mellitus with microalbuminuria, with long-term current use of insulin (HCC) 03/11/2023   History of total knee replacement 03/11/2023   Persistent atrial fibrillation (HCC) 02/16/2023   Thrombocytopenia (HCC) 06/03/2022   B12 deficiency 06/03/2022   Basal cell carcinoma (BCC) of skin of face 11/03/2021   Dyslipidemia associated with type 2 diabetes mellitus (HCC) 11/03/2021   Skin ulcer of left lower leg (HCC) 05/01/2020   Impingement syndrome of shoulder region 11/16/2018   Atherosclerosis of aorta (HCC) 07/21/2018   History of diverticulitis of colon 11/15/2017   Hyperlipidemia due to type 2 diabetes mellitus (HCC) 03/14/2017   Bilateral lower extremity edema 03/27/2016   Vitamin D  deficiency 12/05/2015   Hx of transient ischemic attack (TIA) 09/17/2015   Diabetes mellitus type 2 without retinopathy (HCC) 05/31/2015   Hyperlipidemia 05/30/2015   Type 2 diabetes mellitus with renal manifestations (HCC) 01/08/2015   Incisional hernia, without obstruction or gangrene 03/23/2013   Enlarged prostate with lower urinary tract symptoms (LUTS) 11/03/2012   Myopic degeneration, bilateral 08/12/2012   Nonexudative age-related macular degeneration 07/15/2012   Anisometropia 04/08/2012  Pseudophakia 04/08/2012   History of retinal detachment 04/08/2012   Presence of intraocular lens 04/08/2012   Secondary open-angle glaucoma of left eye, severe stage 01/13/2012   Benign essential HTN  02/03/2007   Hypertension associated with diabetes (HCC) 02/03/2007    Past Surgical History:  Procedure Laterality Date   APPENDECTOMY     BLADDER DIVERTICULECTOMY  2013   CARDIOVERSION N/A 02/24/2023   Procedure: CARDIOVERSION;  Surgeon: Ammon Blunt, MD;  Location: [facility] ORS;  Service: Cardiovascular;  Laterality: N/A;   CARDIOVERSION N/A 04/21/2023   Procedure: CARDIOVERSION;  Surgeon: Ammon Blunt, MD;  Location: [facility] ORS;  Service: Cardiovascular;  Laterality: N/A;   CARPAL TUNNEL RELEASE  01/2022   revision right side   COLON SURGERY Left 1980's   COLONOSCOPY W/ POLYPECTOMY  1990's   Dr Rollene   COLONOSCOPY WITH PROPOFOL  N/A 12/21/2014   Procedure: COLONOSCOPY WITH PROPOFOL ;  Surgeon: Gladis RAYMOND Mariner, MD;  Location: [facility] ENDOSCOPY;  Service: Endoscopy;  Laterality: N/A;   COLONOSCOPY WITH PROPOFOL  N/A 08/16/2017   Procedure: COLONOSCOPY WITH PROPOFOL ;  Surgeon: Mariner Gladis RAYMOND, MD;  Location: [facility] ENDOSCOPY;  Service: Endoscopy;  Laterality: N/A;   COLONOSCOPY WITH PROPOFOL  N/A 01/31/2021   Procedure: COLONOSCOPY WITH PROPOFOL ;  Surgeon: Dessa Reyes ORN, MD;  Location: [facility] ENDOSCOPY;  Service: Endoscopy;  Laterality: N/A;   ESOPHAGOGASTRODUODENOSCOPY (EGD) WITH PROPOFOL  N/A 02/15/2015   Procedure: ESOPHAGOGASTRODUODENOSCOPY (EGD) WITH PROPOFOL ;  Surgeon: Gladis RAYMOND Mariner, MD;  Location: [facility] ENDOSCOPY;  Service: Endoscopy;  Laterality: N/A;   ESOPHAGOGASTRODUODENOSCOPY (EGD) WITH PROPOFOL  N/A 06/10/2017   Procedure: ESOPHAGOGASTRODUODENOSCOPY (EGD) WITH PROPOFOL ;  Surgeon: Mariner Gladis RAYMOND, MD;  Location: [facility] ENDOSCOPY;  Service: Endoscopy;  Laterality: N/A;   ESOPHAGOGASTRODUODENOSCOPY (EGD) WITH PROPOFOL  N/A 08/16/2017   Procedure: ESOPHAGOGASTRODUODENOSCOPY (EGD) WITH PROPOFOL ;  Surgeon: Mariner Gladis RAYMOND, MD;  Location: [facility] ENDOSCOPY;  Service: Endoscopy;  Laterality: N/A;   ESOPHAGOGASTRODUODENOSCOPY (EGD) WITH PROPOFOL  N/A 01/31/2021    Procedure: ESOPHAGOGASTRODUODENOSCOPY (EGD) WITH PROPOFOL ;  Surgeon: Dessa Reyes ORN, MD;  Location: [facility] ENDOSCOPY;  Service: Endoscopy;  Laterality: N/A;   EYE SURGERY     GLAUCOMA SURGERY Left 1990's   [facility]   HEMORROIDECTOMY  1980's   [facility]   HERNIA REPAIR     [facility], Dr Rollene FONTANA HERNIA REPAIR Bilateral [phone]   LARYNX SURGERY  1990   3 times   MOHS SURGERY  01/22/2022   Dr. Timm - [facility]   REPLACEMENT TOTAL KNEE Right    RETINAL DETACHMENT SURGERY Right    TONSILLECTOMY     turp  2013   URETER SURGERY  2013   Dr Ike    Family History  Problem Relation Age of Onset   Diabetes Mother    Cataracts Mother    Metabolic syndrome Mother    Blindness Mother        r/t diabetes   Cataracts Father    Glaucoma Father        Retina-Detachment   Atrial fibrillation Father    Diverticulitis Sister    Glaucoma Brother    Diabetes Brother    Cancer Sister        Gallbladder   Cancer Daughter     Social History   Tobacco Use   Smoking status: Never    Passive exposure: Never   Smokeless tobacco: Never   Tobacco comments:    smoking cessation materials not required  Substance Use Topics   Alcohol use: No     Current Outpatient Medications:  acetaminophen  (TYLENOL ) 500 MG tablet, Take 1,000 mg by mouth 2 (two) times daily as needed for moderate pain (pain score 4-6)., Disp: , Rfl:    apixaban (ELIQUIS) 5 MG TABS tablet, Take 5 mg by mouth 2 (two) times daily., Disp: , Rfl:    brimonidine-timolol (COMBIGAN) 0.2-0.5 % ophthalmic solution, Place 1 drop into the left eye 2 (two) times daily., Disp: , Rfl:    diclofenac  Sodium (VOLTAREN ) 1 % GEL, Apply 4 g topically 4 (four) times daily., Disp: 300 g, Rfl: 1   diltiazem (CARDIZEM CD) 120 MG 24 hr capsule, Take 120 mg by mouth daily., Disp: , Rfl:    dorzolamide (TRUSOPT) 2 % ophthalmic solution, Place 1 drop into the left eye 2 (two) times daily., Disp: , Rfl: 3   empagliflozin  (JARDIANCE) 25 MG TABS tablet, Take 25 mg by mouth daily., Disp: , Rfl:    Exenatide  ER (BYDUREON  BCISE) 2 MG/0.85ML AUIJ, Inject 2 mg into the skin every Sunday., Disp: , Rfl:    finasteride  (PROSCAR ) 5 MG tablet, Take 1 tablet by mouth once daily, Disp: 90 tablet, Rfl: 1   flecainide (TAMBOCOR) 50 MG tablet, Take 100 mg by mouth 2 (two) times daily., Disp: , Rfl:    furosemide  (LASIX ) 20 MG tablet, Take 1 tablet (20 mg total) by mouth daily., Disp: 6 tablet, Rfl: 0   glipiZIDE (GLUCOTROL) 10 MG tablet, Take 10 mg by mouth daily., Disp: , Rfl:    hydrocortisone  2.5 % cream, APPLY TO THE AFFECTED AREA OF FACE AND EARS EVERY OTHER NIGHT ALTERNATING WITH KETOCONAZOLE  CREAM, Disp: 28 g, Rfl: 11   insulin degludec (TRESIBA FLEXTOUCH) 200 UNIT/ML FlexTouch Pen, Inject 16 Units into the skin in the morning., Disp: , Rfl:    ketoconazole  (NIZORAL ) 2 % cream, Apply two grams to the face every other day alternating with hydrocortisone  2.5% cream every other day., Disp: 60 g, Rfl: 11   ketoconazole  (NIZORAL ) 2 % shampoo, Apply 1 Application topically once a week. [place], Disp: 120 mL, Rfl: 11   loperamide (IMODIUM A-D) 2 MG tablet, Take 2 mg by mouth as needed for diarrhea or loose stools., Disp: , Rfl:    loratadine (CLARITIN) 10 MG tablet, Take 10 mg by mouth daily., Disp: , Rfl:    metFORMIN  (GLUCOPHAGE ) 1000 MG tablet, Take 1,000 mg by mouth 2 (two) times daily with a meal. On Monday and Wednesday, Disp: , Rfl:    mometasone  (ELOCON ) 0.1 % lotion, Apply topically daily as needed (itchy scalp). Apply to itchy areas of scalp QD- BID PRN, Disp: 60 mL, Rfl: 5   MOUNJARO 2.5 MG/0.5ML Pen, SMARTSIG:0.5 Milliliter(s) SUB-Q Once a Week, Disp: , Rfl:    Multiple Vitamin (MULTIVITAMIN) tablet, Take 1 tablet by mouth daily., Disp: , Rfl:    omega-3 acid ethyl esters (LOVAZA ) 1 g capsule, Take 2 capsules (2 g total) by mouth 2 (two) times daily., Disp: 480 capsule, Rfl: 3   omeprazole  (PRILOSEC) 20 MG capsule, Take 1  capsule by mouth once daily, Disp: 90 capsule, Rfl: 0   rosuvastatin  (CRESTOR ) 10 MG tablet, TAKE 1 TABLET BY MOUTH AT BEDTIME, Disp: 90 tablet, Rfl: 0   tadalafil  (CIALIS ) 20 MG tablet, Take 1 tablet (20 mg total) by mouth daily as needed for erectile dysfunction., Disp: 90 tablet, Rfl: 0   telmisartan -hydrochlorothiazide  (MICARDIS  HCT) 80-12.5 MG tablet, Take 1 tablet by mouth daily., Disp: 90 tablet, Rfl: 1  Allergies  Allergen Reactions   Demerol [Meperidine] Other (See  Comments) and Anaphylaxis    MAKES BLOOD PRESSURE BOTTOM OUT hypotension   Metoprolol Shortness Of Breath   Dilaudid [Hydromorphone Hcl]     MAKES BLOOD PRESSURE BOTTOM OUT   Actos [Pioglitazone] Other (See Comments) and Hives   Dapagliflozin Rash   Penicillins Rash    I personally reviewed active problem list, medication list, allergies with the patient/caregiver today.   ROS  Ten systems reviewed and is negative except as mentioned in HPI    Objective Physical Exam CONSTITUTIONAL: Patient appears well-developed and well-nourished.  No distress. HEENT: Head atraumatic, normocephalic, neck supple. CARDIOVASCULAR: Normal rate, regular rhythm with some extra beats  and normal heart sounds.  No murmur heard. No BLE edema. PULMONARY: Effort normal and breath sounds normal. No respiratory distress. MUSCULOSKELETAL:slow gait, using a cane PSYCHIATRIC: Patient has a normal mood and affect. behavior is normal. Judgment and thought content normal.  Vitals:   12/21/23 1321  BP: 110/68  Pulse: 88  Resp: 16  SpO2: 96%  Weight: 195 lb 11.2 oz (88.8 kg)  Height: 6' 3.5 (1.918 m)    Body mass index is 24.14 kg/m.  Recent Results (from the past 2160 hours)  Wound culture     Status: Abnormal   Collection Time: 10/28/23  3:52 PM   Specimen: Wound  Result Value Ref Range   MICRO NUMBER: [id number]    SPECIMEN QUALITY: Adequate    SOURCE: WOUND (SITE NOT SPECIFIED)    STATUS: FINAL    GRAM STAIN:      No  epithelial cells seen No white blood cells seen Few Gram positive cocci in pairs Rare Gram positive bacilli   ISOLATE 1: Staphylococcus aureus (A)     Comment: Heavy growth of Staphylococcus aureus      Susceptibility   Staphylococcus aureus - AEROBIC CULT, GRAM STAIN POSITIVE 1    VANCOMYCIN 1 Sensitive     CIPROFLOXACIN  <=0.5 Sensitive     CLINDAMYCIN <=0.25 Sensitive     LEVOFLOXACIN  <=0.12 Sensitive     ERYTHROMYCIN <=0.25 Sensitive     GENTAMICIN <=0.5 Sensitive     OXACILLIN* 0.5 Sensitive      * Oxacillin susceptible staphylococci are susceptible to other penicillinase-stable penicillins (e.g., methicillin, nafcillin), beta- lactam/beta-lactamase inhibitor combinations, and cephems with staphylococcal indications, including cefazolin.     TETRACYCLINE <=1 Sensitive     TRIMETH/SULFA <=10 Sensitive     MOXIFLOXACIN * <=0.25 Sensitive      * Oxacillin susceptible staphylococci are susceptible to other penicillinase-stable penicillins (e.g., methicillin, nafcillin), beta- lactam/beta-lactamase inhibitor combinations, and cephems with staphylococcal indications, including cefazolin. Legend: S = Susceptible  I = Intermediate R = Resistant  NS = Not susceptible SDD = Susceptible Dose Dependent * = Not Tested  NR = Not Reported **NN = See Therapy Comments   HM DIABETES EYE EXAM     Status: Abnormal   Collection Time: 11/04/23  4:38 PM  Result Value Ref Range   HM Diabetic Eye Exam Retinopathy (A) No Retinopathy    Comment: Abstracted by HIM  Hemoglobin A1c     Status: None   Collection Time: 11/18/23 12:00 AM  Result Value Ref Range   Hemoglobin A1C 6.3     Diabetic Foot Exam:     PHQ2/9:    12/21/2023    1:15 PM 10/19/2023    1:32 PM 06/21/2023    2:05 PM 05/10/2023    1:11 PM 05/06/2023    2:32 PM  Depression screen PHQ 2/9  Decreased Interest 0  0 0 0 0  Down, Depressed, Hopeless 0 0 0 0 0  PHQ - 2 Score 0 0 0 0 0  Altered sleeping   0 0 0  Tired, decreased  energy   0 0 0  Change in appetite   0 0 0  Feeling bad or failure about yourself    0 0 0  Trouble concentrating   0 0 0  Moving slowly or fidgety/restless   0 0 0  Suicidal thoughts   0 0 0  PHQ-9 Score   0 0 0  Difficult doing work/chores   Not difficult at all  Not difficult at all    phq 9 is negative  Fall Risk:    12/21/2023    1:15 PM 10/19/2023    1:24 PM 06/21/2023    1:57 PM 05/10/2023    1:11 PM 05/06/2023    2:38 PM  Fall Risk   Falls in the past year? 0 0 0 0 0  Number falls in past yr: 0 0 0 0 0  Injury with Fall? 0 0 0 0 0  Risk for fall due to : No Fall Risks No Fall Risks No Fall Risks Orthopedic patient No Fall Risks  Follow up Falls evaluation completed Falls prevention discussed;Education provided;Falls evaluation completed Falls prevention discussed;Education provided;Falls evaluation completed Falls prevention discussed;Education provided;Falls evaluation completed Falls prevention discussed;Falls evaluation completed      Assessment & Plan History of lower leg wound, now healed The wound has healed completely, confirmed by the wound care team. - Continue wearing compression socks to prevent skin trauma.  Type 2 diabetes mellitus with proteinuria Diabetes is well-controlled with Mounjaro, metformin , and [place]. Proteinuria managed with Jardiance for kidney protection. Adjustments made for upcoming colonoscopy to prevent dehydration. - Hold metformin , glipizide, and Jardiance before the colonoscopy. - Take half the dose of Tresiba before the colonoscopy. - Resume Mounjaro after a 7-day hold.  Chronic atrial fibrillation, status post ablation, on anticoagulation and rhythm control Currently in normal sinus rhythm post-ablation, managed with Eliquis, flecainide, and Cardizem. Eliquis held temporarily for the colonoscopy. - Hold Eliquis for 4 days before the colonoscopy. - Continue flecainide and Cardizem. - Follow up with cardiologist in  September.  Hypertension associated with diabetes Hypertension is well-controlled with Cardizem and telmisartan /HCTZ. - Continue Cardizem and telmisartan /HCTZ.  Dyslipidemia and atherosclerosis of aorta Dyslipidemia managed with rosuvastatin  and Lovaza . Cholesterol levels are good. Atherosclerosis of the aorta is monitored. - Continue rosuvastatin  and Lovaza . - Send prescription for rosuvastatin  for 6 months.  Thrombocytopenia, stable Thrombocytopenia is stable with a slightly low platelet count, not concerning for the upcoming procedure.  Anemia, mild and stable Mild anemia is stable with a hemoglobin level of 13.5.  Vitamin B12 deficiency, on supplementation Vitamin B12 deficiency managed with supplementation, and he is compliant with taking B12 sublingual supplements. - Continue B12 supplementation.  Benign prostatic hyperplasia, on finasteride  Benign prostatic hyperplasia managed with finasteride , and symptoms are controlled. - Continue finasteride .  Family history of Lynch syndrome, with history of high-grade colonic polyp, undergoing surveillance Lynch syndrome requires regular surveillance. Scheduled for a colonoscopy due to history of high-grade colonic polyp. - Proceed with colonoscopy.  History of superficial gastritis, on omeprazole  Superficial gastritis managed with omeprazole . - Continue omeprazole . - Send prescription for omeprazole .  History of esophageal varices without portal hypertension History of esophageal varices without evidence of portal hypertension.

## 2023-12-22 ENCOUNTER — Ambulatory Visit: Admitting: Anesthesiology

## 2023-12-22 ENCOUNTER — Ambulatory Visit
Admission: RE | Admit: 2023-12-22 | Discharge: 2023-12-22 | Disposition: A | Discharge status: Home / Self Care | Attending: Internal Medicine | Admitting: Internal Medicine

## 2023-12-22 ENCOUNTER — Encounter: Payer: Self-pay | Admitting: Internal Medicine

## 2023-12-22 ENCOUNTER — Encounter
Admission: RE | Disposition: A | Discharge status: Home / Self Care | Payer: Self-pay | Source: Home / Self Care | Attending: Internal Medicine

## 2023-12-22 DIAGNOSIS — Z1211 Encounter for screening for malignant neoplasm of colon: Secondary | ICD-10-CM | POA: Insufficient documentation

## 2023-12-22 DIAGNOSIS — K5730 Diverticulosis of large intestine without perforation or abscess without bleeding: Secondary | ICD-10-CM | POA: Insufficient documentation

## 2023-12-22 DIAGNOSIS — Z7985 Long-term (current) use of injectable non-insulin antidiabetic drugs: Secondary | ICD-10-CM | POA: Diagnosis not present

## 2023-12-22 DIAGNOSIS — I4819 Other persistent atrial fibrillation: Secondary | ICD-10-CM | POA: Diagnosis not present

## 2023-12-22 DIAGNOSIS — I4891 Unspecified atrial fibrillation: Secondary | ICD-10-CM | POA: Insufficient documentation

## 2023-12-22 DIAGNOSIS — I129 Hypertensive chronic kidney disease with stage 1 through stage 4 chronic kidney disease, or unspecified chronic kidney disease: Secondary | ICD-10-CM | POA: Insufficient documentation

## 2023-12-22 DIAGNOSIS — K449 Diaphragmatic hernia without obstruction or gangrene: Secondary | ICD-10-CM | POA: Insufficient documentation

## 2023-12-22 DIAGNOSIS — K219 Gastro-esophageal reflux disease without esophagitis: Secondary | ICD-10-CM | POA: Insufficient documentation

## 2023-12-22 DIAGNOSIS — Z980 Intestinal bypass and anastomosis status: Secondary | ICD-10-CM | POA: Diagnosis not present

## 2023-12-22 DIAGNOSIS — Z794 Long term (current) use of insulin: Secondary | ICD-10-CM | POA: Insufficient documentation

## 2023-12-22 DIAGNOSIS — Z800 Family history of malignant neoplasm of digestive organs: Secondary | ICD-10-CM | POA: Diagnosis not present

## 2023-12-22 DIAGNOSIS — I8500 Esophageal varices without bleeding: Secondary | ICD-10-CM | POA: Insufficient documentation

## 2023-12-22 DIAGNOSIS — Z1509 Genetic susceptibility to other malignant neoplasm: Secondary | ICD-10-CM | POA: Insufficient documentation

## 2023-12-22 DIAGNOSIS — Z7901 Long term (current) use of anticoagulants: Secondary | ICD-10-CM | POA: Diagnosis not present

## 2023-12-22 DIAGNOSIS — Z8673 Personal history of transient ischemic attack (TIA), and cerebral infarction without residual deficits: Secondary | ICD-10-CM | POA: Diagnosis not present

## 2023-12-22 DIAGNOSIS — K2970 Gastritis, unspecified, without bleeding: Secondary | ICD-10-CM | POA: Diagnosis not present

## 2023-12-22 DIAGNOSIS — Z7984 Long term (current) use of oral hypoglycemic drugs: Secondary | ICD-10-CM | POA: Diagnosis not present

## 2023-12-22 DIAGNOSIS — K2100 Gastro-esophageal reflux disease with esophagitis, without bleeding: Secondary | ICD-10-CM | POA: Diagnosis not present

## 2023-12-22 DIAGNOSIS — K2289 Other specified disease of esophagus: Secondary | ICD-10-CM | POA: Diagnosis not present

## 2023-12-22 DIAGNOSIS — Z860101 Personal history of adenomatous and serrated colon polyps: Secondary | ICD-10-CM | POA: Diagnosis not present

## 2023-12-22 DIAGNOSIS — E1122 Type 2 diabetes mellitus with diabetic chronic kidney disease: Secondary | ICD-10-CM | POA: Insufficient documentation

## 2023-12-22 DIAGNOSIS — N189 Chronic kidney disease, unspecified: Secondary | ICD-10-CM | POA: Diagnosis not present

## 2023-12-22 DIAGNOSIS — K3189 Other diseases of stomach and duodenum: Secondary | ICD-10-CM | POA: Diagnosis not present

## 2023-12-22 DIAGNOSIS — K5790 Diverticulosis of intestine, part unspecified, without perforation or abscess without bleeding: Secondary | ICD-10-CM | POA: Diagnosis not present

## 2023-12-22 DIAGNOSIS — Z09 Encounter for follow-up examination after completed treatment for conditions other than malignant neoplasm: Secondary | ICD-10-CM | POA: Diagnosis not present

## 2023-12-22 HISTORY — PX: COLONOSCOPY: SHX5424

## 2023-12-22 HISTORY — PX: ESOPHAGOGASTRODUODENOSCOPY: SHX5428

## 2023-12-22 LAB — GLUCOSE, CAPILLARY: Glucose-Capillary: 92 mg/dL (ref 70–99)

## 2023-12-22 SURGERY — COLONOSCOPY
Anesthesia: General

## 2023-12-22 MED ORDER — PHENYLEPHRINE 80 MCG/ML (10ML) SYRINGE FOR IV PUSH (FOR BLOOD PRESSURE SUPPORT)
PREFILLED_SYRINGE | INTRAVENOUS | Status: DC | PRN
  Administered 2023-12-22 (×2): 80 ug via INTRAVENOUS

## 2023-12-22 MED ORDER — PROPOFOL 500 MG/50ML IV EMUL
INTRAVENOUS | Status: DC | PRN
  Administered 2023-12-22: 130 ug/kg/min via INTRAVENOUS
  Administered 2023-12-22: 100 mg via INTRAVENOUS

## 2023-12-22 MED ORDER — LIDOCAINE HCL (PF) 2 % IJ SOLN
INTRAMUSCULAR | Status: AC
  Filled 2023-12-22: qty 5

## 2023-12-22 MED ORDER — LIDOCAINE HCL (PF) 2 % IJ SOLN
INTRAMUSCULAR | Status: DC | PRN
  Administered 2023-12-22: 100 mg via INTRADERMAL

## 2023-12-22 MED ORDER — EPHEDRINE SULFATE-NACL 50-0.9 MG/10ML-% IV SOSY
PREFILLED_SYRINGE | INTRAVENOUS | Status: DC | PRN
Start: 2023-12-22 — End: 2023-12-22
  Administered 2023-12-22: 10 mg via INTRAVENOUS

## 2023-12-22 MED ORDER — SODIUM CHLORIDE 0.9 % IV SOLN
INTRAVENOUS | Status: DC
  Administered 2023-12-22: 20 mL/h via INTRAVENOUS

## 2023-12-22 MED ORDER — PROPOFOL 1000 MG/100ML IV EMUL
INTRAVENOUS | Status: AC
  Filled 2023-12-22: qty 100

## 2023-12-22 MED ORDER — LIDOCAINE HCL (PF) 2 % IJ SOLN
INTRAMUSCULAR | Status: AC
  Filled 2023-12-22: qty 10

## 2023-12-22 MED ORDER — PROPOFOL 10 MG/ML IV BOLUS
INTRAVENOUS | Status: AC
  Filled 2023-12-22: qty 20

## 2023-12-22 MED ORDER — EPHEDRINE 5 MG/ML INJ
INTRAVENOUS | Status: AC
Start: 2023-12-22 — End: 2023-12-22
  Filled 2023-12-22: qty 5

## 2023-12-22 NOTE — Anesthesia Postprocedure Evaluation (Signed)
 Anesthesia Post Note  Patient: Jeremy [name]  Procedure(s) Performed: COLONOSCOPY EGD (ESOPHAGOGASTRODUODENOSCOPY)  Patient location during evaluation: PACU Anesthesia Type: General Level of consciousness: awake and alert, oriented and patient cooperative Pain management: pain level controlled Vital Signs Assessment: post-procedure vital signs reviewed and stable Respiratory status: spontaneous breathing, nonlabored ventilation and respiratory function stable Cardiovascular status: blood pressure returned to baseline and stable Postop Assessment: adequate PO intake Anesthetic complications: no   There were no known notable events for this encounter.   Last Vitals:  Vitals:   12/22/23 0907 12/22/23 0917  BP: (!) 97/42 (!) 118/57  Pulse: 63 65  Resp: 14 14  Temp:  36.4 C  SpO2: 98% 99%    Last Pain:  Vitals:   12/22/23 0917  TempSrc: Temporal  PainSc: 0-No pain                 Alfonso Ruths

## 2023-12-22 NOTE — Op Note (Addendum)
 [facility] Gastroenterology Patient Name: Jeremy [name] Procedure Date: 12/22/2023 7:36 AM MRN: [id number] Account #: [id number] Date of Birth: 1936/04/02 Admit Type: Outpatient Age: 88 Room: [facility] ENDO ROOM 2 Gender: Male Note Status: Finalized Instrument Name: Upper Endoscope [id number] Procedure:             Upper GI endoscopy Indications:           Surveillance for malignancy secondary tofamily history                         of Lynch Syndrome, Gastro-esophageal reflux disease Providers:             Joseangel Nettleton K. Aundria MD, MD Referring MD:          Kaedyn Belardo K. Aundria MD, MD (Referring MD) Medicines:             Propofol  per Anesthesia Complications:         No immediate complications. Estimated blood loss:                         Minimal. Procedure:             Pre-Anesthesia Assessment:                        - The risks and benefits of the procedure and the                         sedation options and risks were discussed with the                         patient. All questions were answered and informed                         consent was obtained.                        - Patient identification and proposed procedure were                         verified prior to the procedure by the nurse. The                         procedure was verified in the procedure room.                        - ASA Grade Assessment: III - A patient with severe                         systemic disease.                        - After reviewing the risks and benefits, the patient                         was deemed in satisfactory condition to undergo the                         procedure.  After obtaining informed consent, the endoscope was                         passed under direct vision. Throughout the procedure,                         the patient's blood pressure, pulse, and oxygen                         saturations were monitored continuously. The Endoscope                          was introduced through the mouth, and advanced to the                         third part of duodenum. The upper GI endoscopy was                         accomplished without difficulty. The patient tolerated                         the procedure well. Findings:      Moderate tortuosity of the mid to distal esophagus was noted compatible       with a diagnosis of Presbyesophagus. Estimated blood loss: none.      The exam of the esophagus was otherwise normal.      A 4 cm hiatal hernia was present.      Patchy moderately erythematous mucosa without bleeding was found in the       gastric body. Biopsies were taken with a cold forceps for histology.       Estimated blood loss was minimal.      The exam of the stomach was otherwise normal.      The examined duodenum was normal. Impression:            - 4 cm hiatal hernia.                        - Erythematous mucosa in the gastric body. Biopsied.                        - Normal examined duodenum. Recommendation:        - Await pathology results.                        - Proceed with colonoscopy Procedure Code(s):     --- Professional ---                        [phone], Esophagogastroduodenoscopy, flexible,                         transoral; with biopsy, single or multiple Diagnosis Code(s):     --- Professional ---                        K21.9, Gastro-esophageal reflux disease without                         esophagitis  Z15.09, Genetic susceptibility to other malignant                         neoplasm                        K31.89, Other diseases of stomach and duodenum                        K44.9, Diaphragmatic hernia without obstruction or                         gangrene CPT copyright 2022 [employer]. All rights reserved. The codes documented in this report are preliminary and upon coder review may  be revised to meet current compliance requirements. Ladell MARLA Boss MD,  MD 12/22/2023 8:45:11 AM This report has been signed electronically. Number of Addenda: 0 Note Initiated On: 12/22/2023 7:36 AM Estimated Blood Loss:  Estimated blood loss was minimal.      [facility]

## 2023-12-22 NOTE — H&P (Signed)
 Outpatient short stay form Pre-procedure 12/22/2023 8:24 AM Jeremy [name], M.D.  Primary Physician: Jeremy [name], M.D.  Reason for visit:  GERD, Family history of Lynch Syndrome, Hx of Adenomatous colon polyps.  History of present illness:  Jeremy [name] presents today for clinical follow-up of family history of Lynch syndrome, although it is not clear that he has Lynch syndrome himself. He does have a personal history of adenomatous polyps and reports a remote history of severe dysplasia in cecal polyp that was removed. His last few colonoscopies have showed no significant polyps except for adenomatous polyp in 2016. The patient denies any change in bowel habits, rectal bleeding or involuntary weight loss. He is a retired Sports administrator and would like to have another endoluminal evaluation performed for preventative purposes. He understands this is not usually recommended in the advanced age but he wishes to have it given his previous history. GERD symptoms appear to be controlled. He has been diagnosed remotely with a spuriously with esophageal varices although it appears that patient may have only mild to moderate vascular prominence of the esophagus. He denies any known previous history of gastrointestinal bleeding in the form of hematemesis or melena. The patient is status post ablation for atrial fibrillation but is still taking Eliquis to prevent thromboembolic phenomena. He is followed by Jeremy [name] at cardiology. Patient mentions he had NOT taken Eliquis x 4 days prior to today's procedures.    Current Facility-Administered Medications:    0.9 %  sodium chloride  infusion, , Intravenous, Continuous, Mulga, Jeremy Sensing K, MD, Last Rate: 20 mL/hr at 12/22/23 0745, 20 mL/hr at 12/22/23 0745  Medications Prior to Admission  Medication Sig Dispense Refill Last Dose/Taking   acetaminophen  (TYLENOL ) 500 MG tablet Take 1,000 mg by mouth 2 (two) times daily as needed for moderate pain (pain  score 4-6).   12/21/2023   apixaban (ELIQUIS) 5 MG TABS tablet Take 5 mg by mouth 2 (two) times daily.   Past Week   brimonidine-timolol (COMBIGAN) 0.2-0.5 % ophthalmic solution Place 1 drop into the left eye 2 (two) times daily.   Past Week   diclofenac  Sodium (VOLTAREN ) 1 % GEL Apply 4 g topically 4 (four) times daily. 300 g 1 Past Week   diltiazem (CARDIZEM CD) 120 MG 24 hr capsule Take 120 mg by mouth daily.   12/22/2023 at  6:45 AM   dorzolamide (TRUSOPT) 2 % ophthalmic solution Place 1 drop into the left eye 2 (two) times daily.  3 Past Week   empagliflozin (JARDIANCE) 25 MG TABS tablet Take 25 mg by mouth daily.   Past Week   finasteride  (PROSCAR ) 5 MG tablet Take 1 tablet (5 mg total) by mouth daily. 90 tablet 1 Past Week   flecainide (TAMBOCOR) 50 MG tablet Take 100 mg by mouth 2 (two) times daily.   12/22/2023 at  6:45 AM   glipiZIDE (GLUCOTROL) 10 MG tablet Take 10 mg by mouth daily.   Past Week   hydrocortisone  2.5 % cream APPLY TO THE AFFECTED AREA OF FACE AND EARS EVERY OTHER NIGHT ALTERNATING WITH KETOCONAZOLE  CREAM 28 g 11 Past Week   insulin degludec (TRESIBA FLEXTOUCH) 200 UNIT/ML FlexTouch Pen Inject 22 Units into the skin in the morning.   Past Week   ketoconazole  (NIZORAL ) 2 % cream Apply two grams to the face every other day alternating with hydrocortisone  2.5% cream every other day. 60 g 11 Past Week   ketoconazole  (NIZORAL ) 2 % shampoo Apply 1 Application topically once a week.  Massea 120 mL 11 Past Week   loperamide (IMODIUM A-D) 2 MG tablet Take 2 mg by mouth as needed for diarrhea or loose stools.   Past Week   loratadine (CLARITIN) 10 MG tablet Take 10 mg by mouth daily.   Past Week   metFORMIN  (GLUCOPHAGE ) 1000 MG tablet Take 1,000 mg by mouth 2 (two) times daily with a meal. On Monday and Wednesday   Past Week   mometasone  (ELOCON ) 0.1 % lotion Apply topically daily as needed (itchy scalp). Apply to itchy areas of scalp QD- BID PRN 60 mL 5 Past Week   MOUNJARO 2.5 MG/0.5ML  Pen SMARTSIG:0.5 Milliliter(s) SUB-Q Once a Week   Past Week   Multiple Vitamin (MULTIVITAMIN) tablet Take 1 tablet by mouth daily.   Past Week   omega-3 acid ethyl esters (LOVAZA ) 1 g capsule Take 2 capsules (2 g total) by mouth 2 (two) times daily. 480 capsule 3 Past Week   omeprazole  (PRILOSEC) 20 MG capsule Take 1 capsule (20 mg total) by mouth daily. 90 capsule 1 Past Week   rosuvastatin  (CRESTOR ) 10 MG tablet Take 1 tablet (10 mg total) by mouth at bedtime. 90 tablet 1 Past Week   tadalafil  (CIALIS ) 20 MG tablet Take 1 tablet (20 mg total) by mouth daily as needed for erectile dysfunction. 90 tablet 0 Past Month   telmisartan -hydrochlorothiazide  (MICARDIS  HCT) 80-12.5 MG tablet Take 1 tablet by mouth daily. 90 tablet 1 12/22/2023 at  6:45 AM     Allergies  Allergen Reactions   Demerol [Meperidine] Other (See Comments) and Anaphylaxis    MAKES BLOOD PRESSURE BOTTOM OUT hypotension   Metoprolol Shortness Of Breath   Dilaudid [Hydromorphone Hcl]     MAKES BLOOD PRESSURE BOTTOM OUT   Actos [Pioglitazone] Other (See Comments) and Hives   Dapagliflozin Rash   Penicillins Rash     Past Medical History:  Diagnosis Date   Allergy    Basal cell carcinoma 10/09/2021   right medial forehead at glabella, Mohs 01/22/22   Chronic kidney disease    Diabetes mellitus without complication (HCC)    Diabetic neuropathy (HCC)    Diverticula, colon 1980's   Diverticulosis    Enlarged prostate    GERD (gastroesophageal reflux disease)    Glaucoma    Hemorrhoids    High cholesterol    History of hiatal hernia    Hyperlipidemia    Hypertension    Obesity    Onychomycosis    Seizures (HCC)    Small bowel obstruction (HCC) 1990's   Urination disorder     Review of systems:  Otherwise negative.    Physical Exam  Gen: Alert, oriented. Appears stated age.  HEENT: [place]/AT. PERRLA. Lungs: CTA, no wheezes. CV: RR nl S1, S2. Abd: soft, benign, no masses. BS+ Ext: No edema. Pulses  2+    Planned procedures: Proceed with EGD and colonoscopy. The patient understands the nature of the planned procedure, indications, risks, alternatives and potential complications including but not limited to bleeding, infection, perforation, damage to internal organs and possible oversedation/side effects from anesthesia. The patient agrees and gives consent to proceed.  Please refer to procedure notes for findings, recommendations and patient disposition/instructions.     Braya Habermehl [name]. [name], M.D. Gastroenterology 12/22/2023  8:24 AM

## 2023-12-22 NOTE — Anesthesia Preprocedure Evaluation (Addendum)
 Anesthesia Evaluation  Patient identified by MRN, date of birth, ID band Patient awake    Reviewed: Allergy & Precautions, NPO status , Patient's Chart, lab work & pertinent test results  History of Anesthesia Complications (+) PONV and history of anesthetic complications  Airway Mallampati: III   Neck ROM: Full    Dental no notable dental hx.    Pulmonary neg pulmonary ROS   Pulmonary exam normal breath sounds clear to auscultation       Cardiovascular hypertension, Normal cardiovascular exam+ dysrhythmias (a fib s/p ablation on Eliquis)  Rhythm:Regular Rate:Normal  ECG 04/21/23:  Sinus rhythm Prolonged PR interval Nonspecific IVCD with LAD Anterolateral infarct, age indeterminate   Neuro/Psych TIA Neuromuscular disease (neuropathy)    GI/Hepatic hiatal hernia,GERD  ,,  Endo/Other  diabetes, Type 2, Insulin Dependent    Renal/GU Renal disease (CKD)     Musculoskeletal   Abdominal   Peds  Hematology  (+) Blood dyscrasia, anemia   Anesthesia Other Findings Last dose of Mounjaro 12/10/23.   Cardiology note 11/18/23:  88 y.o. male with  1. Paroxysmal atrial fibrillation (CMS/HHS-HCC)  2. Hypertension associated with diabetes (CMS/HHS-HCC)  3. Persistent atrial fibrillation (CMS/HHS-HCC)  4. S/P ablation of atrial fibrillation  5. SOB (shortness of breath) on exertion  6. Hyperlipidemia associated with type 2 diabetes mellitus , unspecified (CMS-HCC)  7. Hx of transient ischemic attack (TIA)  8. Type 2 diabetes mellitus with diabetic polyneuropathy, with long-term current use of insulin (CMS/HHS-HCC)   88 year old gentleman with a history of an episode of exertional chest pain without recurrence. Stress echocardiogram revealed normal left ventricular function, without evidence for scar or ischemia. The patient has essential hypertension, blood pressure mildly elevated today on current BP medications. The patient  recently noted episodes of transient bradycardia which improved after discontinuation of bisoprolol . ECG revealed new onset atrial fibrillation. 72-hour Holter monitor 02/01/2023 - 02/04/2023 revealed predominant atrial fibrillation with mean heart rate of 80 bpm. He was started on Cardizem CD, flecainide, and Eliquis. He underwent cardioversion on 02/24/23, which was initially successful, but he suspects he converted back to atrial fibrillation the next day. Echo shows normal LVEF with moderate MR and TR with moderate to severely enlarged left and right atria. The patient underwent successful right knee surgery 03/04/2023. He underwent repeat, initially successful cardioversion 04/21/2023 and reverted back to atrial fibrillation. Patient underwent successful catheter ablation 08/03/2023.  Plan   1. Continue current medications 2. Constipation bowel low-sodium diet 3. DASH diet printed instructions given to the patient 4. Counseled patient about low-cholesterol diet 5. Continue rosuvastatin  for hyperlipidemia management 6. Low fat and cholesterol diet print instructions given to the patient 7. Diabetes diet print instructions given to the patient 8. Continue Eliquis for stroke prevention 9. Return to clinic for follow-up in 3 months  Orders Placed This Encounter  Procedures  ECG 12-lead   Return in about 3 months (around 02/18/2024).  Reproductive/Obstetrics                              Anesthesia Physical Anesthesia Plan  ASA: 3  Anesthesia Plan: General   Post-op Pain Management:    Induction: Intravenous  PONV Risk Score and Plan: 3 and Propofol  infusion, TIVA and Treatment may vary due to age or medical condition  Airway Management Planned: Natural Airway  Additional Equipment:   Intra-op Plan:   Post-operative Plan:   Informed Consent: I have reviewed the patients History and  Physical, chart, labs and discussed the procedure including the risks,  benefits and alternatives for the proposed anesthesia with the patient or authorized representative who has indicated his/her understanding and acceptance.       Plan Discussed with: CRNA  Anesthesia Plan Comments: (LMA/GETA backup discussed.  Patient consented for risks of anesthesia including but not limited to:  - adverse reactions to medications - damage to eyes, teeth, lips or other oral mucosa - nerve damage due to positioning  - sore throat or hoarseness - damage to heart, brain, nerves, lungs, other parts of body or loss of life  Informed patient about role of CRNA in peri- and intra-operative care.  Patient voiced understanding.)         Anesthesia Quick Evaluation

## 2023-12-22 NOTE — Transfer of Care (Signed)
 Immediate Anesthesia Transfer of Care Note  Patient: Jeremy [name]  Procedure(s) Performed: COLONOSCOPY EGD (ESOPHAGOGASTRODUODENOSCOPY)  Patient Location: Endoscopy Unit  Anesthesia Type:General  Level of Consciousness: drowsy  Airway & Oxygen Therapy: Patient Spontanous Breathing  Post-op Assessment: Report given to RN and Post -op Vital signs reviewed and stable  Post vital signs: Reviewed and stable  Last Vitals:  Vitals Value Taken Time  BP 117/70 12/22/23 08:59  Temp 36.4 C 12/22/23 08:57  Pulse 60 12/22/23 09:01  Resp 14 12/22/23 09:01  SpO2 97 % 12/22/23 09:01  Vitals shown include unfiled device data.  Last Pain:  Vitals:   12/22/23 0857  TempSrc: Temporal  PainSc: 0-No pain         Complications: There were no known notable events for this encounter.

## 2023-12-22 NOTE — Interval H&P Note (Signed)
 History and Physical Interval Note:  12/22/2023 8:26 AM  Jeremy [name]  has presented today for surgery, with the diagnosis of Family history of Lynch syndrome [Z80.0] Gastro-esophageal reflux disease without esophagitis [K21.9] History of adenomatous polyp of colon [Z86.0101].  The various methods of treatment have been discussed with the patient and family. After consideration of risks, benefits and other options for treatment, the patient has consented to  Procedure(s) with comments: COLONOSCOPY (N/A) - DM EGD (ESOPHAGOGASTRODUODENOSCOPY) (N/A) as a surgical intervention.  The patient's history has been reviewed, patient examined, no change in status, stable for surgery.  I have reviewed the patient's chart and labs.  Questions were answered to the patient's satisfaction.     Rosenberg, Raffael Bugarin

## 2023-12-22 NOTE — Op Note (Addendum)
 [facility] Gastroenterology Patient Name: Jeremy [name] Procedure Date: 12/22/2023 7:35 AM MRN: [id number] Account #: [id number] Date of Birth: 08-11-1935 Admit Type: Outpatient Age: 88 Room: [facility] ENDO ROOM 2 Gender: Male Note Status: Finalized Instrument Name: [id number] Procedure:             Colonoscopy Indications:           High risk colon cancer surveillance: Personal history                         of non-advanced adenoma, Family history of Lynch                         Syndrome Providers:             Zayyan Mullen K. Aundria MD, MD Referring MD:          Lashaundra Lehrmann K. Aundria MD, MD (Referring MD) Medicines:             Propofol  per Anesthesia Complications:         No immediate complications. Estimated blood loss: None. Procedure:             Pre-Anesthesia Assessment:                        - The risks and benefits of the procedure and the                         sedation options and risks were discussed with the                         patient. All questions were answered and informed                         consent was obtained.                        - Patient identification and proposed procedure were                         verified prior to the procedure by the nurse. The                         procedure was verified in the procedure room.                        - ASA Grade Assessment: III - A patient with severe                         systemic disease.                        - After reviewing the risks and benefits, the patient                         was deemed in satisfactory condition to undergo the                         procedure.  After obtaining informed consent, the colonoscope was                         passed under direct vision. Throughout the procedure,                         the patient's blood pressure, pulse, and oxygen                         saturations were monitored continuously. The                          Colonoscope was introduced through the anus and                         advanced to the the cecum, identified by appendiceal                         orifice and ileocecal valve. The colonoscopy was                         performed without difficulty. The patient tolerated                         the procedure well. The quality of the bowel                         preparation was good. The ileocecal valve, appendiceal                         orifice, and rectum were photographed. Findings:      The perianal and digital rectal examinations were normal. Pertinent       negatives include normal sphincter tone and no palpable rectal lesions.      Many medium-mouthed diverticula were found in the descending colon.      There was evidence of a prior end-to-side colo-colonic anastomosis in       the recto-sigmoid colon. This was patent and was characterized by       healthy appearing mucosa. The anastomosis was traversed.      The exam was otherwise without abnormality on direct and retroflexion       views. Impression:            - Diverticulosis in the descending colon.                        - Patent end-to-side colo-colonic anastomosis,                         characterized by healthy appearing mucosa.                        - The examination was otherwise normal on direct and                         retroflexion views.                        - No specimens collected. Recommendation:        - Await pathology results from EGD,  also performed                         today.                        - Patient has a contact number available for                         emergencies. The signs and symptoms of potential                         delayed complications were discussed with the patient.                         Return to normal activities tomorrow. Written                         discharge instructions were provided to the patient.                        - Resume previous diet.                         - Continue present medications.                        - You do NOT require further colon cancer screening                         measures (Annual stool testing (i.e. hemoccult, FIT,                         cologuard), sigmoidoscopy, colonoscopy or CT                         colonography). You should share this recommendation                         with your Primary Care provider.                        - Return to my office in 1 year.                        - The findings and recommendations were discussed with                         the patient. Procedure Code(s):     --- Professional ---                        H9894, Colorectal cancer screening; colonoscopy on                         individual at high risk Diagnosis Code(s):     --- Professional ---                        K57.30, Diverticulosis of large intestine without  perforation or abscess without bleeding                        Z98.0, Intestinal bypass and anastomosis status                        Z86.010, Personal history of colonic polyps CPT copyright 2022 American Medical Association. All rights reserved. The codes documented in this report are preliminary and upon coder review may  be revised to meet current compliance requirements. Ladell MARLA Boss MD, MD 12/22/2023 9:00:36 AM This report has been signed electronically. Number of Addenda: 0 Note Initiated On: 12/22/2023 7:35 AM Scope Withdrawal Time: 0 hours 2 minutes 7 seconds  Total Procedure Duration: 0 hours 4 minutes 17 seconds  Estimated Blood Loss:  Estimated blood loss: none.      [facility]

## 2023-12-23 DIAGNOSIS — M65832 Other synovitis and tenosynovitis, left forearm: Secondary | ICD-10-CM | POA: Diagnosis not present

## 2023-12-23 DIAGNOSIS — M778 Other enthesopathies, not elsewhere classified: Secondary | ICD-10-CM | POA: Diagnosis not present

## 2023-12-23 DIAGNOSIS — M13842 Other specified arthritis, left hand: Secondary | ICD-10-CM | POA: Diagnosis not present

## 2023-12-23 DIAGNOSIS — E119 Type 2 diabetes mellitus without complications: Secondary | ICD-10-CM | POA: Diagnosis not present

## 2023-12-23 DIAGNOSIS — I4891 Unspecified atrial fibrillation: Secondary | ICD-10-CM | POA: Diagnosis not present

## 2023-12-23 DIAGNOSIS — M25832 Other specified joint disorders, left wrist: Secondary | ICD-10-CM | POA: Diagnosis not present

## 2023-12-23 DIAGNOSIS — D519 Vitamin B12 deficiency anemia, unspecified: Secondary | ICD-10-CM | POA: Diagnosis not present

## 2023-12-23 DIAGNOSIS — M1812 Unilateral primary osteoarthritis of first carpometacarpal joint, left hand: Secondary | ICD-10-CM | POA: Diagnosis not present

## 2023-12-23 LAB — SURGICAL PATHOLOGY

## 2023-12-24 DIAGNOSIS — R2689 Other abnormalities of gait and mobility: Secondary | ICD-10-CM | POA: Diagnosis not present

## 2023-12-24 DIAGNOSIS — R278 Other lack of coordination: Secondary | ICD-10-CM | POA: Diagnosis not present

## 2023-12-24 DIAGNOSIS — M6281 Muscle weakness (generalized): Secondary | ICD-10-CM | POA: Diagnosis not present

## 2023-12-24 DIAGNOSIS — Z96651 Presence of right artificial knee joint: Secondary | ICD-10-CM | POA: Diagnosis not present

## 2023-12-24 DIAGNOSIS — M7072 Other bursitis of hip, left hip: Secondary | ICD-10-CM | POA: Diagnosis not present

## 2023-12-27 DIAGNOSIS — R2689 Other abnormalities of gait and mobility: Secondary | ICD-10-CM | POA: Diagnosis not present

## 2023-12-27 DIAGNOSIS — M7072 Other bursitis of hip, left hip: Secondary | ICD-10-CM | POA: Diagnosis not present

## 2023-12-27 DIAGNOSIS — R278 Other lack of coordination: Secondary | ICD-10-CM | POA: Diagnosis not present

## 2023-12-27 DIAGNOSIS — Z96651 Presence of right artificial knee joint: Secondary | ICD-10-CM | POA: Diagnosis not present

## 2023-12-27 DIAGNOSIS — M6281 Muscle weakness (generalized): Secondary | ICD-10-CM | POA: Diagnosis not present

## 2023-12-29 DIAGNOSIS — R2689 Other abnormalities of gait and mobility: Secondary | ICD-10-CM | POA: Diagnosis not present

## 2023-12-29 DIAGNOSIS — M6281 Muscle weakness (generalized): Secondary | ICD-10-CM | POA: Diagnosis not present

## 2023-12-29 DIAGNOSIS — M7072 Other bursitis of hip, left hip: Secondary | ICD-10-CM | POA: Diagnosis not present

## 2023-12-29 DIAGNOSIS — R278 Other lack of coordination: Secondary | ICD-10-CM | POA: Diagnosis not present

## 2023-12-29 DIAGNOSIS — Z96651 Presence of right artificial knee joint: Secondary | ICD-10-CM | POA: Diagnosis not present

## 2023-12-31 DIAGNOSIS — R278 Other lack of coordination: Secondary | ICD-10-CM | POA: Diagnosis not present

## 2023-12-31 DIAGNOSIS — Z96651 Presence of right artificial knee joint: Secondary | ICD-10-CM | POA: Diagnosis not present

## 2023-12-31 DIAGNOSIS — M7072 Other bursitis of hip, left hip: Secondary | ICD-10-CM | POA: Diagnosis not present

## 2023-12-31 DIAGNOSIS — M6281 Muscle weakness (generalized): Secondary | ICD-10-CM | POA: Diagnosis not present

## 2023-12-31 DIAGNOSIS — R2689 Other abnormalities of gait and mobility: Secondary | ICD-10-CM | POA: Diagnosis not present

## 2024-01-03 DIAGNOSIS — R2689 Other abnormalities of gait and mobility: Secondary | ICD-10-CM | POA: Diagnosis not present

## 2024-01-03 DIAGNOSIS — M7072 Other bursitis of hip, left hip: Secondary | ICD-10-CM | POA: Diagnosis not present

## 2024-01-03 DIAGNOSIS — M6281 Muscle weakness (generalized): Secondary | ICD-10-CM | POA: Diagnosis not present

## 2024-01-03 DIAGNOSIS — R278 Other lack of coordination: Secondary | ICD-10-CM | POA: Diagnosis not present

## 2024-01-03 DIAGNOSIS — Z96651 Presence of right artificial knee joint: Secondary | ICD-10-CM | POA: Diagnosis not present

## 2024-01-05 DIAGNOSIS — R278 Other lack of coordination: Secondary | ICD-10-CM | POA: Diagnosis not present

## 2024-01-05 DIAGNOSIS — Z96651 Presence of right artificial knee joint: Secondary | ICD-10-CM | POA: Diagnosis not present

## 2024-01-05 DIAGNOSIS — M6281 Muscle weakness (generalized): Secondary | ICD-10-CM | POA: Diagnosis not present

## 2024-01-05 DIAGNOSIS — R2689 Other abnormalities of gait and mobility: Secondary | ICD-10-CM | POA: Diagnosis not present

## 2024-01-05 DIAGNOSIS — M7072 Other bursitis of hip, left hip: Secondary | ICD-10-CM | POA: Diagnosis not present

## 2024-01-06 DIAGNOSIS — M65832 Other synovitis and tenosynovitis, left forearm: Secondary | ICD-10-CM | POA: Diagnosis not present

## 2024-01-06 DIAGNOSIS — M7061 Trochanteric bursitis, right hip: Secondary | ICD-10-CM | POA: Diagnosis not present

## 2024-01-06 DIAGNOSIS — M13811 Other specified arthritis, right shoulder: Secondary | ICD-10-CM | POA: Diagnosis not present

## 2024-01-07 DIAGNOSIS — M6281 Muscle weakness (generalized): Secondary | ICD-10-CM | POA: Diagnosis not present

## 2024-01-07 DIAGNOSIS — R278 Other lack of coordination: Secondary | ICD-10-CM | POA: Diagnosis not present

## 2024-01-07 DIAGNOSIS — M7072 Other bursitis of hip, left hip: Secondary | ICD-10-CM | POA: Diagnosis not present

## 2024-01-07 DIAGNOSIS — R2689 Other abnormalities of gait and mobility: Secondary | ICD-10-CM | POA: Diagnosis not present

## 2024-01-07 DIAGNOSIS — Z96651 Presence of right artificial knee joint: Secondary | ICD-10-CM | POA: Diagnosis not present

## 2024-01-10 DIAGNOSIS — R278 Other lack of coordination: Secondary | ICD-10-CM | POA: Diagnosis not present

## 2024-01-10 DIAGNOSIS — M7072 Other bursitis of hip, left hip: Secondary | ICD-10-CM | POA: Diagnosis not present

## 2024-01-10 DIAGNOSIS — Z96651 Presence of right artificial knee joint: Secondary | ICD-10-CM | POA: Diagnosis not present

## 2024-01-10 DIAGNOSIS — M6281 Muscle weakness (generalized): Secondary | ICD-10-CM | POA: Diagnosis not present

## 2024-01-10 DIAGNOSIS — R2689 Other abnormalities of gait and mobility: Secondary | ICD-10-CM | POA: Diagnosis not present

## 2024-01-12 DIAGNOSIS — R278 Other lack of coordination: Secondary | ICD-10-CM | POA: Diagnosis not present

## 2024-01-12 DIAGNOSIS — Z96651 Presence of right artificial knee joint: Secondary | ICD-10-CM | POA: Diagnosis not present

## 2024-01-12 DIAGNOSIS — R2689 Other abnormalities of gait and mobility: Secondary | ICD-10-CM | POA: Diagnosis not present

## 2024-01-12 DIAGNOSIS — M7072 Other bursitis of hip, left hip: Secondary | ICD-10-CM | POA: Diagnosis not present

## 2024-01-12 DIAGNOSIS — M6281 Muscle weakness (generalized): Secondary | ICD-10-CM | POA: Diagnosis not present

## 2024-01-14 DIAGNOSIS — M7072 Other bursitis of hip, left hip: Secondary | ICD-10-CM | POA: Diagnosis not present

## 2024-01-14 DIAGNOSIS — Z96651 Presence of right artificial knee joint: Secondary | ICD-10-CM | POA: Diagnosis not present

## 2024-01-14 DIAGNOSIS — M6281 Muscle weakness (generalized): Secondary | ICD-10-CM | POA: Diagnosis not present

## 2024-01-14 DIAGNOSIS — R278 Other lack of coordination: Secondary | ICD-10-CM | POA: Diagnosis not present

## 2024-01-14 DIAGNOSIS — R2689 Other abnormalities of gait and mobility: Secondary | ICD-10-CM | POA: Diagnosis not present

## 2024-01-17 DIAGNOSIS — R278 Other lack of coordination: Secondary | ICD-10-CM | POA: Diagnosis not present

## 2024-01-17 DIAGNOSIS — M7072 Other bursitis of hip, left hip: Secondary | ICD-10-CM | POA: Diagnosis not present

## 2024-01-17 DIAGNOSIS — M6281 Muscle weakness (generalized): Secondary | ICD-10-CM | POA: Diagnosis not present

## 2024-01-17 DIAGNOSIS — R2689 Other abnormalities of gait and mobility: Secondary | ICD-10-CM | POA: Diagnosis not present

## 2024-01-17 DIAGNOSIS — Z96651 Presence of right artificial knee joint: Secondary | ICD-10-CM | POA: Diagnosis not present

## 2024-01-19 DIAGNOSIS — R278 Other lack of coordination: Secondary | ICD-10-CM | POA: Diagnosis not present

## 2024-01-19 DIAGNOSIS — M6281 Muscle weakness (generalized): Secondary | ICD-10-CM | POA: Diagnosis not present

## 2024-01-19 DIAGNOSIS — R2689 Other abnormalities of gait and mobility: Secondary | ICD-10-CM | POA: Diagnosis not present

## 2024-01-19 DIAGNOSIS — Z96651 Presence of right artificial knee joint: Secondary | ICD-10-CM | POA: Diagnosis not present

## 2024-01-19 DIAGNOSIS — M7072 Other bursitis of hip, left hip: Secondary | ICD-10-CM | POA: Diagnosis not present

## 2024-01-20 DIAGNOSIS — L03032 Cellulitis of left toe: Secondary | ICD-10-CM | POA: Diagnosis not present

## 2024-01-20 DIAGNOSIS — L600 Ingrowing nail: Secondary | ICD-10-CM | POA: Diagnosis not present

## 2024-01-20 DIAGNOSIS — E1142 Type 2 diabetes mellitus with diabetic polyneuropathy: Secondary | ICD-10-CM | POA: Diagnosis not present

## 2024-01-20 DIAGNOSIS — B351 Tinea unguium: Secondary | ICD-10-CM | POA: Diagnosis not present

## 2024-01-21 DIAGNOSIS — R278 Other lack of coordination: Secondary | ICD-10-CM | POA: Diagnosis not present

## 2024-01-21 DIAGNOSIS — R2689 Other abnormalities of gait and mobility: Secondary | ICD-10-CM | POA: Diagnosis not present

## 2024-01-21 DIAGNOSIS — M6281 Muscle weakness (generalized): Secondary | ICD-10-CM | POA: Diagnosis not present

## 2024-01-21 DIAGNOSIS — Z96651 Presence of right artificial knee joint: Secondary | ICD-10-CM | POA: Diagnosis not present

## 2024-01-21 DIAGNOSIS — M7072 Other bursitis of hip, left hip: Secondary | ICD-10-CM | POA: Diagnosis not present

## 2024-01-31 DIAGNOSIS — M6281 Muscle weakness (generalized): Secondary | ICD-10-CM | POA: Diagnosis not present

## 2024-01-31 DIAGNOSIS — R2689 Other abnormalities of gait and mobility: Secondary | ICD-10-CM | POA: Diagnosis not present

## 2024-01-31 DIAGNOSIS — Z96651 Presence of right artificial knee joint: Secondary | ICD-10-CM | POA: Diagnosis not present

## 2024-01-31 DIAGNOSIS — M7072 Other bursitis of hip, left hip: Secondary | ICD-10-CM | POA: Diagnosis not present

## 2024-01-31 DIAGNOSIS — R278 Other lack of coordination: Secondary | ICD-10-CM | POA: Diagnosis not present

## 2024-02-02 DIAGNOSIS — M7072 Other bursitis of hip, left hip: Secondary | ICD-10-CM | POA: Diagnosis not present

## 2024-02-02 DIAGNOSIS — R2689 Other abnormalities of gait and mobility: Secondary | ICD-10-CM | POA: Diagnosis not present

## 2024-02-02 DIAGNOSIS — Z96651 Presence of right artificial knee joint: Secondary | ICD-10-CM | POA: Diagnosis not present

## 2024-02-02 DIAGNOSIS — M6281 Muscle weakness (generalized): Secondary | ICD-10-CM | POA: Diagnosis not present

## 2024-02-02 DIAGNOSIS — R278 Other lack of coordination: Secondary | ICD-10-CM | POA: Diagnosis not present

## 2024-02-03 DIAGNOSIS — Z23 Encounter for immunization: Secondary | ICD-10-CM | POA: Diagnosis not present

## 2024-02-04 DIAGNOSIS — R2689 Other abnormalities of gait and mobility: Secondary | ICD-10-CM | POA: Diagnosis not present

## 2024-02-04 DIAGNOSIS — R278 Other lack of coordination: Secondary | ICD-10-CM | POA: Diagnosis not present

## 2024-02-04 DIAGNOSIS — Z96651 Presence of right artificial knee joint: Secondary | ICD-10-CM | POA: Diagnosis not present

## 2024-02-04 DIAGNOSIS — M6281 Muscle weakness (generalized): Secondary | ICD-10-CM | POA: Diagnosis not present

## 2024-02-04 DIAGNOSIS — M7072 Other bursitis of hip, left hip: Secondary | ICD-10-CM | POA: Diagnosis not present

## 2024-02-07 DIAGNOSIS — R278 Other lack of coordination: Secondary | ICD-10-CM | POA: Diagnosis not present

## 2024-02-07 DIAGNOSIS — M7072 Other bursitis of hip, left hip: Secondary | ICD-10-CM | POA: Diagnosis not present

## 2024-02-07 DIAGNOSIS — R2689 Other abnormalities of gait and mobility: Secondary | ICD-10-CM | POA: Diagnosis not present

## 2024-02-07 DIAGNOSIS — M6281 Muscle weakness (generalized): Secondary | ICD-10-CM | POA: Diagnosis not present

## 2024-02-07 DIAGNOSIS — Z96651 Presence of right artificial knee joint: Secondary | ICD-10-CM | POA: Diagnosis not present

## 2024-02-09 DIAGNOSIS — R2689 Other abnormalities of gait and mobility: Secondary | ICD-10-CM | POA: Diagnosis not present

## 2024-02-09 DIAGNOSIS — R278 Other lack of coordination: Secondary | ICD-10-CM | POA: Diagnosis not present

## 2024-02-09 DIAGNOSIS — M7072 Other bursitis of hip, left hip: Secondary | ICD-10-CM | POA: Diagnosis not present

## 2024-02-09 DIAGNOSIS — Z96651 Presence of right artificial knee joint: Secondary | ICD-10-CM | POA: Diagnosis not present

## 2024-02-09 DIAGNOSIS — M6281 Muscle weakness (generalized): Secondary | ICD-10-CM | POA: Diagnosis not present

## 2024-02-11 DIAGNOSIS — R278 Other lack of coordination: Secondary | ICD-10-CM | POA: Diagnosis not present

## 2024-02-11 DIAGNOSIS — M7072 Other bursitis of hip, left hip: Secondary | ICD-10-CM | POA: Diagnosis not present

## 2024-02-11 DIAGNOSIS — M6281 Muscle weakness (generalized): Secondary | ICD-10-CM | POA: Diagnosis not present

## 2024-02-11 DIAGNOSIS — Z96651 Presence of right artificial knee joint: Secondary | ICD-10-CM | POA: Diagnosis not present

## 2024-02-11 DIAGNOSIS — R2689 Other abnormalities of gait and mobility: Secondary | ICD-10-CM | POA: Diagnosis not present

## 2024-02-14 DIAGNOSIS — M25511 Pain in right shoulder: Secondary | ICD-10-CM | POA: Diagnosis not present

## 2024-02-14 DIAGNOSIS — M7072 Other bursitis of hip, left hip: Secondary | ICD-10-CM | POA: Diagnosis not present

## 2024-02-14 DIAGNOSIS — M75101 Unspecified rotator cuff tear or rupture of right shoulder, not specified as traumatic: Secondary | ICD-10-CM | POA: Diagnosis not present

## 2024-02-14 DIAGNOSIS — R2689 Other abnormalities of gait and mobility: Secondary | ICD-10-CM | POA: Diagnosis not present

## 2024-02-14 DIAGNOSIS — M12811 Other specific arthropathies, not elsewhere classified, right shoulder: Secondary | ICD-10-CM | POA: Diagnosis not present

## 2024-02-14 DIAGNOSIS — M6281 Muscle weakness (generalized): Secondary | ICD-10-CM | POA: Diagnosis not present

## 2024-02-14 DIAGNOSIS — R278 Other lack of coordination: Secondary | ICD-10-CM | POA: Diagnosis not present

## 2024-02-16 ENCOUNTER — Other Ambulatory Visit: Payer: Self-pay | Admitting: Family Medicine

## 2024-02-16 DIAGNOSIS — R2689 Other abnormalities of gait and mobility: Secondary | ICD-10-CM | POA: Diagnosis not present

## 2024-02-16 DIAGNOSIS — M7072 Other bursitis of hip, left hip: Secondary | ICD-10-CM | POA: Diagnosis not present

## 2024-02-16 DIAGNOSIS — M6281 Muscle weakness (generalized): Secondary | ICD-10-CM | POA: Diagnosis not present

## 2024-02-16 DIAGNOSIS — M25511 Pain in right shoulder: Secondary | ICD-10-CM | POA: Diagnosis not present

## 2024-02-16 DIAGNOSIS — M12811 Other specific arthropathies, not elsewhere classified, right shoulder: Secondary | ICD-10-CM | POA: Diagnosis not present

## 2024-02-16 DIAGNOSIS — M75101 Unspecified rotator cuff tear or rupture of right shoulder, not specified as traumatic: Secondary | ICD-10-CM | POA: Diagnosis not present

## 2024-02-16 NOTE — Telephone Encounter (Unsigned)
 Copied from CRM #[id number]. Topic: Clinical - Medication Refill >> Feb 16, 2024  2:35 PM Amy B wrote: Medication: COVID injection.    Has the patient contacted their pharmacy? Yes Pharmacy states a prescription is required for him to receive his COVID vaccine. (Agent: If no, request that the patient contact the pharmacy for the refill. If patient does not wish to contact the pharmacy document the reason why and proceed with request.) (Agent: If yes, when and what did the pharmacy advise?)  This is the patient's preferred pharmacy:  [facility] [address], [place] - [address] [place] [address] Phone: [phone] Fax: [phone]  Is this the correct pharmacy for this prescription? Yes If no, delete pharmacy and type the correct one.   Has the prescription been filled recently? No  Is the patient out of the medication? Yes  Has the patient been seen for an appointment in the last year OR does the patient have an upcoming appointment? Yes  Can we respond through MyChart? No  Agent: Please be advised that Rx refills may take up to 3 business days. We ask that you follow-up with your pharmacy.

## 2024-02-17 ENCOUNTER — Telehealth: Payer: Self-pay

## 2024-02-17 NOTE — Telephone Encounter (Signed)
 Copied from CRM #[id number]. Topic: Clinical - Medication Refill >> Feb 16, 2024  2:35 PM Amy B wrote: Medication: COVID injection.    Has the patient contacted their pharmacy? Yes Pharmacy states a prescription is required for him to receive his COVID vaccine. (Agent: If no, request that the patient contact the pharmacy for the refill. If patient does not wish to contact the pharmacy document the reason why and proceed with request.) (Agent: If yes, when and what did the pharmacy advise?)  This is the patient's preferred pharmacy:  [facility] [address] [place] - [address] [place] [address] Phone: [phone] Fax: [phone]  Is this the correct pharmacy for this prescription? Yes If no, delete pharmacy and type the correct one.   Has the prescription been filled recently? No  Is the patient out of the medication? Yes  Has the patient been seen for an appointment in the last year OR does the patient have an upcoming appointment? Yes  Can we respond through MyChart? No  Agent: Please be advised that Rx refills may take up to 3 business days. We ask that you follow-up with your pharmacy. >> Feb 17, 2024  1:59 PM Antwanette L wrote: Patient is calling back to get an status update on his Covid booster. Please send the order to  [facility] on [address] [place], [place] [address] as soon as possible.

## 2024-02-18 DIAGNOSIS — Z23 Encounter for immunization: Secondary | ICD-10-CM | POA: Diagnosis not present

## 2024-02-18 NOTE — Telephone Encounter (Signed)
RX faxed to pharmacy.

## 2024-02-20 ENCOUNTER — Other Ambulatory Visit: Payer: Self-pay | Admitting: Family Medicine

## 2024-02-20 DIAGNOSIS — I10 Essential (primary) hypertension: Secondary | ICD-10-CM

## 2024-02-20 DIAGNOSIS — E1159 Type 2 diabetes mellitus with other circulatory complications: Secondary | ICD-10-CM

## 2024-02-21 DIAGNOSIS — E1142 Type 2 diabetes mellitus with diabetic polyneuropathy: Secondary | ICD-10-CM | POA: Diagnosis not present

## 2024-02-21 DIAGNOSIS — Z794 Long term (current) use of insulin: Secondary | ICD-10-CM | POA: Diagnosis not present

## 2024-02-21 DIAGNOSIS — M12811 Other specific arthropathies, not elsewhere classified, right shoulder: Secondary | ICD-10-CM | POA: Diagnosis not present

## 2024-02-21 DIAGNOSIS — M75101 Unspecified rotator cuff tear or rupture of right shoulder, not specified as traumatic: Secondary | ICD-10-CM | POA: Diagnosis not present

## 2024-02-23 ENCOUNTER — Encounter: Payer: Self-pay | Admitting: Family Medicine

## 2024-02-23 ENCOUNTER — Ambulatory Visit (INDEPENDENT_AMBULATORY_CARE_PROVIDER_SITE_OTHER): Admitting: Family Medicine

## 2024-02-23 ENCOUNTER — Ambulatory Visit: Payer: Self-pay | Admitting: Family Medicine

## 2024-02-23 ENCOUNTER — Ambulatory Visit
Admission: RE | Admit: 2024-02-23 | Discharge: 2024-02-23 | Disposition: A | Discharge status: Home / Self Care | Source: Ambulatory Visit | Attending: Family Medicine

## 2024-02-23 ENCOUNTER — Ambulatory Visit
Admission: RE | Admit: 2024-02-23 | Discharge: 2024-02-23 | Disposition: A | Discharge status: Home / Self Care | Attending: Family Medicine | Admitting: Family Medicine

## 2024-02-23 ENCOUNTER — Ambulatory Visit: Payer: Self-pay

## 2024-02-23 VITALS — BP 118/68 | HR 91 | Temp 98.3°F | Resp 16 | Ht 75.0 in | Wt 185.0 lb

## 2024-02-23 DIAGNOSIS — U071 COVID-19: Secondary | ICD-10-CM | POA: Diagnosis present

## 2024-02-23 DIAGNOSIS — J069 Acute upper respiratory infection, unspecified: Secondary | ICD-10-CM

## 2024-02-23 DIAGNOSIS — Z0389 Encounter for observation for other suspected diseases and conditions ruled out: Secondary | ICD-10-CM | POA: Diagnosis not present

## 2024-02-23 DIAGNOSIS — R059 Cough, unspecified: Secondary | ICD-10-CM | POA: Diagnosis present

## 2024-02-23 LAB — POC COVID19/FLU A&B COMBO
Covid Antigen, POC: POSITIVE — AB
Influenza A Antigen, POC: NEGATIVE
Influenza B Antigen, POC: NEGATIVE

## 2024-02-23 MED ORDER — BENZONATATE 100 MG PO CAPS: 100.0000 mg | ORAL_CAPSULE | Freq: Three times a day (TID) | ORAL | 0 refills | Status: DC | PRN

## 2024-02-23 MED ORDER — HYDROCOD POLI-CHLORPHE POLI ER 10-8 MG/5ML PO SUER: 2.5000 mL | Freq: Every evening | ORAL | 0 refills | Status: DC | PRN

## 2024-02-23 MED ORDER — MOLNUPIRAVIR EUA 200MG CAPSULE: 4.0000 | ORAL_CAPSULE | Freq: Two times a day (BID) | ORAL | 0 refills | Status: AC

## 2024-02-23 NOTE — Patient Instructions (Signed)
 Try more rest and supportive care like cough meds, tylenol  and pushing fluids.  I will call you with the chest x-ray results and let you know if additional treatments or medications are indicated.  You can try the cough syrup and we have discussed the potential side effects of the hydrocodone cough syrup - sedation, confusion, sleepiness, use as last resort after trying other over the counter cough meds.  Follow up if any worsening Recommend going to ER or calling 911 if you have distress/difficulty breathing develop

## 2024-02-23 NOTE — Telephone Encounter (Signed)
 FYI Only or Action Required?: FYI only for provider.  Patient was last seen in primary care on 12/21/2023 by Glenard Mire, MD.  Called Nurse Triage reporting Cough.  Symptoms began several days ago.  Interventions attempted: Nothing.  Symptoms are: gradually worsening.  Triage Disposition: See Physician Within 24 Hours  Patient/caregiver understands and will follow disposition?: Yes      Copied from CRM [id number]. Topic: Clinical - Red Word Triage >> Feb 23, 2024  7:36 AM Charlet HERO wrote: Red Word that prompted transfer to Nurse Triage: Patient is calling about respiratory issue he is stating that he has had a covid booster and now he is aching and sore , cough and he is stating that he is congested and mucus. Reason for Disposition  SEVERE coughing spells (e.g., whooping sound after coughing, vomiting after coughing)  Answer Assessment - Initial Assessment Questions 1. ONSET: When did the cough begin?      Had covid vaccine booster on Friday, then Sat. Started with muscle aches, chills, cough, congestion 2. SEVERITY: How bad is the cough today?      Kept him up all night 3. SPUTUM: Describe the color of your sputum (e.g., none, dry cough; clear, white, yellow, green)     Dry cough, sounds wet but can't cough up. 4. HEMOPTYSIS: Are you coughing up any blood? If Yes, ask: How much? (e.g., flecks, streaks, tablespoons, etc.)     denies 5. DIFFICULTY BREATHING: Are you having difficulty breathing? If Yes, ask: How bad is it? (e.g., mild, moderate, severe)      Yes, because of cough 6. FEVER: Do you have a fever? If Yes, ask: What is your temperature, how was it measured, and when did it start?     chills 7. CARDIAC HISTORY: Do you have any history of heart disease? (e.g., heart attack, congestive heart failure)      On blood thinners, hx afib 8. LUNG HISTORY: Do you have any history of lung disease?  (e.g., pulmonary embolus, asthma, emphysema)      denies 9. PE RISK FACTORS: Do you have a history of blood clots? (or: recent major surgery, recent prolonged travel, bedridden)     denies 10. OTHER SYMPTOMS: Do you have any other symptoms? (e.g., runny nose, wheezing, chest pain)       denies 11. PREGNANCY: Is there any chance you are pregnant? When was your last menstrual period?       na 12. TRAVEL: Have you traveled out of the country in the last month? (e.g., travel history, exposures)       Denies.  Protocols used: Cough - Acute Non-Productive-A-AH

## 2024-02-23 NOTE — Progress Notes (Signed)
 Patient ID: Jeremy [name], male    DOB: 09-Jun-1936, 88 y.o.   MRN: [id number]  PCP: Glenard Mire, MD  Chief Complaint  Patient presents with  . Cough    Started over the weekend worsening. Received COVID booster Fri- felt this way since.    Subjective:   Jeremy [name] is a 88 y.o. male, presents to clinic with CC of the following:  Cough Pertinent negatives include no chest pain, shortness of breath or wheezing.    Nasal drainage, post nasal drip, cough that is nonproductive but bothersome and much worse at night onset 3-4 d ago with also started with associated fatigue, body aches after getting covid booster.  He does have multiple sick contacts including covid outbreak and [place] He denies CP, SOB, wheeze, tightness, near syncope, DOE, reports he is eating and drinking well with normal urination and bowels  Patient Active Problem List   Diagnosis Date Noted  . Chronic superficial gastritis without bleeding 12/21/2023  . History of total right knee replacement 08/30/2023  . S/P ablation of atrial fibrillation 08/04/2023  . Anticoagulated on Eliquis 06/03/2023  . Family history of Lynch syndrome 06/03/2023  . Insomnia 03/11/2023  . Anemia 03/11/2023  . Hypertension, benign 03/11/2023  . Type 2 diabetes mellitus with microalbuminuria, with long-term current use of insulin (HCC) 03/11/2023  . Persistent atrial fibrillation (HCC) 02/16/2023  . Thrombocytopenia (HCC) 06/03/2022  . B12 deficiency 06/03/2022  . Basal cell carcinoma (BCC) of skin of face 11/03/2021  . Dyslipidemia associated with type 2 diabetes mellitus (HCC) 11/03/2021  . Skin ulcer of left lower leg (HCC) 05/01/2020  . Impingement syndrome of shoulder region 11/16/2018  . Atherosclerosis of aorta (HCC) 07/21/2018  . History of diverticulitis of colon 11/15/2017  . Esophageal varices determined by endoscopy (HCC) 11/15/2017  . Hyperlipidemia due to type 2 diabetes mellitus (HCC) 03/14/2017  .  Bilateral lower extremity edema 03/27/2016  . Vitamin D  deficiency 12/05/2015  . Hx of transient ischemic attack (TIA) 09/17/2015  . Diabetes mellitus type 2 without retinopathy (HCC) 05/31/2015  . Hyperlipidemia 05/30/2015  . Type 2 diabetes mellitus with renal manifestations (HCC) 01/08/2015  . Incisional hernia, without obstruction or gangrene 03/23/2013  . Benign prostatic hyperplasia without lower urinary tract symptoms 11/03/2012  . Myopic degeneration, bilateral 08/12/2012  . Nonexudative age-related macular degeneration 07/15/2012  . Anisometropia 04/08/2012  . Pseudophakia 04/08/2012  . History of retinal detachment 04/08/2012  . Presence of intraocular lens 04/08/2012  . Secondary open-angle glaucoma of left eye, severe stage 01/13/2012  . Benign essential HTN 02/03/2007  . Hypertension associated with diabetes (HCC) 02/03/2007      Current Outpatient Medications:  .  acetaminophen  (TYLENOL ) 500 MG tablet, Take 1,000 mg by mouth 2 (two) times daily as needed for moderate pain (pain score 4-6)., Disp: , Rfl:  .  apixaban (ELIQUIS) 5 MG TABS tablet, Take 5 mg by mouth 2 (two) times daily., Disp: , Rfl:  .  benzonatate  (TESSALON ) 100 MG capsule, Take 1-2 capsules (100-200 mg total) by mouth 3 (three) times daily as needed for cough., Disp: 30 capsule, Rfl: 0 .  brimonidine-timolol (COMBIGAN) 0.2-0.5 % ophthalmic solution, Place 1 drop into the left eye 2 (two) times daily., Disp: , Rfl:  .  chlorpheniramine-HYDROcodone (TUSSIONEX) 10-8 MG/5ML, Take 2.5-5 mLs by mouth at bedtime as needed for cough., Disp: 70 mL, Rfl: 0 .  diclofenac  Sodium (VOLTAREN ) 1 % GEL, Apply 4 g topically 4 (four) times daily., Disp:  300 g, Rfl: 1 .  diltiazem (CARDIZEM CD) 120 MG 24 hr capsule, Take 120 mg by mouth daily., Disp: , Rfl:  .  dorzolamide (TRUSOPT) 2 % ophthalmic solution, Place 1 drop into the left eye 2 (two) times daily., Disp: , Rfl: 3 .  empagliflozin (JARDIANCE) 25 MG TABS tablet, Take  25 mg by mouth daily., Disp: , Rfl:  .  finasteride  (PROSCAR ) 5 MG tablet, Take 1 tablet (5 mg total) by mouth daily., Disp: 90 tablet, Rfl: 1 .  flecainide (TAMBOCOR) 50 MG tablet, Take 100 mg by mouth 2 (two) times daily., Disp: , Rfl:  .  glipiZIDE (GLUCOTROL) 10 MG tablet, Take 10 mg by mouth daily., Disp: , Rfl:  .  hydrocortisone  2.5 % cream, APPLY TO THE AFFECTED AREA OF FACE AND EARS EVERY OTHER NIGHT ALTERNATING WITH KETOCONAZOLE  CREAM, Disp: 28 g, Rfl: 11 .  insulin degludec (TRESIBA FLEXTOUCH) 200 UNIT/ML FlexTouch Pen, Inject 22 Units into the skin in the morning., Disp: , Rfl:  .  ketoconazole  (NIZORAL ) 2 % cream, Apply two grams to the face every other day alternating with hydrocortisone  2.5% cream every other day., Disp: 60 g, Rfl: 11 .  ketoconazole  (NIZORAL ) 2 % shampoo, Apply 1 Application topically once a week. [place], Disp: 120 mL, Rfl: 11 .  loperamide (IMODIUM A-D) 2 MG tablet, Take 2 mg by mouth as needed for diarrhea or loose stools., Disp: , Rfl:  .  loratadine (CLARITIN) 10 MG tablet, Take 10 mg by mouth daily., Disp: , Rfl:  .  metFORMIN  (GLUCOPHAGE ) 1000 MG tablet, Take 1,000 mg by mouth 2 (two) times daily with a meal. On Monday and Wednesday, Disp: , Rfl:  .  mometasone  (ELOCON ) 0.1 % lotion, Apply topically daily as needed (itchy scalp). Apply to itchy areas of scalp QD- BID PRN, Disp: 60 mL, Rfl: 5 .  MOUNJARO 2.5 MG/0.5ML Pen, SMARTSIG:0.5 Milliliter(s) SUB-Q Once a Week, Disp: , Rfl:  .  Multiple Vitamin (MULTIVITAMIN) tablet, Take 1 tablet by mouth daily., Disp: , Rfl:  .  omega-3 acid ethyl esters (LOVAZA ) 1 g capsule, Take 2 capsules (2 g total) by mouth 2 (two) times daily., Disp: 480 capsule, Rfl: 3 .  omeprazole  (PRILOSEC) 20 MG capsule, Take 1 capsule (20 mg total) by mouth daily., Disp: 90 capsule, Rfl: 1 .  rosuvastatin  (CRESTOR ) 10 MG tablet, Take 1 tablet (10 mg total) by mouth at bedtime., Disp: 90 tablet, Rfl: 1 .  tadalafil  (CIALIS ) 20 MG tablet, Take  1 tablet (20 mg total) by mouth daily as needed for erectile dysfunction., Disp: 90 tablet, Rfl: 0 .  telmisartan -hydrochlorothiazide  (MICARDIS  HCT) 80-12.5 MG tablet, Take 1 tablet by mouth daily., Disp: 90 tablet, Rfl: 1   Allergies  Allergen Reactions  . Demerol [Meperidine] Other (See Comments) and Anaphylaxis    MAKES BLOOD PRESSURE BOTTOM OUT hypotension  . Metoprolol Shortness Of Breath  . Dilaudid [Hydromorphone Hcl]     MAKES BLOOD PRESSURE BOTTOM OUT  . Actos [Pioglitazone] Other (See Comments) and Hives  . Dapagliflozin Rash  . Penicillins Rash     Social History   Tobacco Use  . Smoking status: Never    Passive exposure: Never  . Smokeless tobacco: Never  . Tobacco comments:    smoking cessation materials not required  Vaping Use  . Vaping status: Never Used  Substance Use Topics  . Alcohol use: No  . Drug use: No      Chart Review Today: I personally reviewed  active problem list, medication list, allergies, family history, social history, health maintenance, notes from last encounter, lab results, imaging with the patient/caregiver today.  Review of Systems  Constitutional: Negative.   HENT:  Positive for congestion.   Eyes: Negative.   Respiratory:  Positive for cough. Negative for choking, chest tightness, shortness of breath, wheezing and stridor.   Cardiovascular: Negative.  Negative for chest pain, palpitations and leg swelling.  Gastrointestinal: Negative.   Endocrine: Negative.   Genitourinary: Negative.   Musculoskeletal: Negative.   Skin: Negative.   Allergic/Immunologic: Negative.   Neurological: Negative.   Hematological: Negative.   Psychiatric/Behavioral: Negative.    All other systems reviewed and are negative.      Objective:   Vitals:   02/23/24 1307  BP: 118/68  Pulse: 91  Resp: 16  Temp: 98.3 F (36.8 C)  SpO2: 97%  Weight: 185 lb (83.9 kg)  Height: 6' 3 (1.905 m)    Body mass index is 23.12 kg/m.  Physical  Exam Vitals and nursing note reviewed.  Constitutional:      General: He is not in acute distress.    Appearance: Normal appearance. He is well-developed. He is not ill-appearing, toxic-appearing or diaphoretic.  HENT:     Head: Normocephalic and atraumatic.     Right Ear: External ear normal.     Left Ear: External ear normal.     Nose: Nose normal.  Eyes:     General: No scleral icterus.       Right eye: No discharge.        Left eye: No discharge.     Conjunctiva/sclera: Conjunctivae normal.  Neck:     Trachea: No tracheal deviation.  Cardiovascular:     Rate and Rhythm: Normal rate.  Pulmonary:     Effort: Pulmonary effort is normal. No respiratory distress.     Breath sounds: No stridor. No wheezing, rhonchi or rales.     Comments: Only slightly diminished BS at right low lung fields Skin:    General: Skin is warm and dry.     Findings: No rash.  Neurological:     Mental Status: He is alert.     Motor: No abnormal muscle tone.     Coordination: Coordination normal.     Gait: Gait normal.  Psychiatric:        Mood and Affect: Mood normal.        Behavior: Behavior normal.      Results for orders placed or performed in visit on 02/23/24  POC Covid19/Flu A&B Antigen   Collection Time: 02/23/24  1:49 PM  Result Value Ref Range   Influenza A Antigen, POC Negative Negative   Influenza B Antigen, POC Negative Negative   Covid Antigen, POC Positive (A) Negative       Assessment & Plan:   Pt elderly male here for 3-4 d URI sx with body aches, fatigue, nasal drainage cough Lungs only slightly diminished to right lung base - discussed CXR to r/o his concern of pneumonia.  He denies pain with breathing, fever, sweats, SOB Discussed URI/cough tx options   Plan decided with pt as follows after long discussion of OTC options and testing options:  Try more rest and supportive care like cough meds, tylenol  and pushing fluids.  I will call you with the chest x-ray results  and let you know if additional treatments or medications are indicated.  You can try the cough syrup and we have discussed the potential side effects  of the hydrocodone cough syrup - sedation, confusion, sleepiness, use as last resort after trying other over the counter cough meds.  Follow up if any worsening Recommend going to ER or calling 911 if you have distress/difficulty breathing develop  He finally did allow me to do POC in office test and was + for COVID Reviewed paxlovid - multiple DDI, consulted with pharmacist - who recommends avoiding paxlovid - Molnupiravir  800 mg BID x5 days     ICD-10-CM   1. Cough, unspecified type  R05.9 benzonatate  (TESSALON ) 100 MG capsule    DG Chest 2 View    chlorpheniramine-HYDROcodone (TUSSIONEX) 10-8 MG/5ML    POC Covid19/Flu A&B Antigen    2. Upper respiratory tract infection, unspecified type  J06.9     3. Upper respiratory tract infection due to COVID-19 virus  U07.1 DG Chest 2 View   J06.9 POC Covid19/Flu A&B Antigen    molnupiravir  EUA (LAGEVRIO ) 200 mg CAPS capsule      Will contact pt with CXR results if any additional tx is necessary Recommended trying tessalon , nasal sprays, rest, pushing fluids, otc cough meds that he tolerates, mucinex all before using the tussionex and he verbalized understanding of SE/risks and cautions, his friend with him today says if he needs to use the cough syrup he will be able to check on him and he has a pulse ox at home.  Pt also has tramadol  from ortho provider but he is not currently taking it and was instructed to and agreed to NOT take cough syrup with tramadol .  PDMP reviewed today   Michelene Cower, PA-C 02/23/24 4:31 PM

## 2024-02-24 ENCOUNTER — Telehealth: Payer: Self-pay

## 2024-02-24 NOTE — Telephone Encounter (Signed)
 Patient called back inquiring which drugs exactly interacted.  Leisa reviewed Paxlovid with the pharmacist who recommends avoiding Paxlovid - Molnupiravir  800 mg BID x5 days instead.  DDI: Flecainide and Eliquis. Advised to contact Cardiologist for any questions about it.

## 2024-02-24 NOTE — Telephone Encounter (Signed)
 Called and lvm informing patient he had a drug-to-drug interaction for Paxlovid so an alternative was sent in.

## 2024-02-24 NOTE — Telephone Encounter (Signed)
 Spoke with Mr. Colgate regarding DDI between Paxlovid and Eliquis and Flecainide. Patient vocalized understanding for reason to avoid Paxlovid. Patient reported he left a message with his cardiologist as well.   Patient explained his concern was because the pharmacy did not have Molnupiravir  in stock yesterday and had to order it for today.   Patient inquired about timing of medication administration with regard to symptom onset. Explained that the medication should be administered ideally within 5 days of symptom onset.  He explained he will see if he can find someone to pick the medication up from the pharmacy today. All questions and concerns addressed.  Samir Ishaq E. Marsh, PharmD Clinical Pharmacist [facility] [phone]

## 2024-02-24 NOTE — Telephone Encounter (Signed)
 Copied from CRM #[id number]. Topic: Clinical - Medication Question >> Feb 24, 2024  9:28 AM Treva T wrote: Reason for CRM: Received call from patient, states he was seen in office on 02/23/24, and tested positive for COVID.  Per patient is inquiring on why Paxlovid was not prescribed, as he states he needs medication as soon possible.   He is requesting a return call to discuss further.   Can be reached at [phone].  Patient aware of same day call back.

## 2024-02-24 NOTE — Telephone Encounter (Signed)
 Copied from CRM #[id number]. Topic: Clinical - Medication Question >> Feb 24, 2024  9:28 AM Jeremy [name] wrote: Reason for CRM: Received call from patient, states he was seen in office on 02/23/24, and tested positive for COVID.  Per patient is inquiring on why Paxlovid was not prescribed, as he states he needs medication as soon possible.   He is requesting a return call to discuss further.   Can be reached at [phone].  Patient aware of same day call back. >> Feb 24, 2024 11:26 AM Jeremy [name] wrote: I call [facility] with Jeremy [name] on the phone. [facility] did receive the order for molnupiravir  EUA (LAGEVRIO ) 200 mg CAPS capsule  and the [employer] will not pay for medication. It will cost Jeremy [name] $1,100.00. The Pharmacist at [facility] told Jeremy [name] he could stop taking his cholesterol medicine and he could take Paxlovid. Please call Jeremy [name] to discuss. His number is [phone]. Thanks

## 2024-02-25 ENCOUNTER — Telehealth: Payer: Self-pay

## 2024-02-25 ENCOUNTER — Other Ambulatory Visit: Payer: Self-pay | Admitting: Family Medicine

## 2024-02-25 DIAGNOSIS — I10 Essential (primary) hypertension: Secondary | ICD-10-CM

## 2024-02-25 DIAGNOSIS — E1159 Type 2 diabetes mellitus with other circulatory complications: Secondary | ICD-10-CM

## 2024-02-25 MED ORDER — TELMISARTAN-HCTZ 80-12.5 MG PO TABS: 1.0000 | ORAL_TABLET | Freq: Every day | ORAL | 0 refills | Status: DC

## 2024-02-25 NOTE — Telephone Encounter (Signed)
 Copied from CRM [id number]. Topic: Clinical - Prescription Issue >> Feb 25, 2024  9:11 AM Treva T wrote: Reason for CRM: Patient calling regarding medication refill status. Patient sates he has requested refill several times with pharmacy, but per pharmacy has not received any response from office.  Medication: telmisartan -hydrochlorothiazide  (MICARDIS  HCT) 80-12.5 MG tablet, for 90 day supply. Per patient states someone needs to call pharmacy to get this figured out, as he needs medication for 90 day supply.   Patient reports he took last pill today, 02/25/24, and will need medication as soon as possible, for dose on tomorrow for blood pressure.  Patient requesting a follow up call, can be reached at [phone].  Patient aware of same day call back.

## 2024-02-25 NOTE — Telephone Encounter (Signed)
 Spoke to patient and sent 3 month supply to [facility] pharmacy.

## 2024-02-25 NOTE — Telephone Encounter (Signed)
 Copied from CRM [id number]. Topic: Clinical - Medication Refill >> Feb 25, 2024  7:45 AM Rosaria E wrote: Medication: telmisartan -hydrochlorothiazide  (MICARDIS  HCT) 80-12.5 MG tablet  Requesting 90 day supply, says he requested this a week ago.   Has the patient contacted their pharmacy? Yes (Agent: If no, request that the patient contact the pharmacy for the refill. If patient does not wish to contact the pharmacy document the reason why and proceed with request.) (Agent: If yes, when and what did the pharmacy advise?)  This is the patient's preferred pharmacy:  [facility] [address] [place] - [address] [place] [address] Phone: [phone] Fax: [phone]   Is this the correct pharmacy for this prescription? Yes If no, delete pharmacy and type the correct one.   Has the prescription been filled recently? Yes  Is the patient out of the medication? Yes  Has the patient been seen for an appointment in the last year OR does the patient have an upcoming appointment? Yes  Can we respond through MyChart? Yes  Agent: Please be advised that Rx refills may take up to 3 business days. We ask that you follow-up with your pharmacy.

## 2024-02-29 DIAGNOSIS — S46111A Strain of muscle, fascia and tendon of long head of biceps, right arm, initial encounter: Secondary | ICD-10-CM | POA: Diagnosis not present

## 2024-02-29 DIAGNOSIS — M75121 Complete rotator cuff tear or rupture of right shoulder, not specified as traumatic: Secondary | ICD-10-CM | POA: Diagnosis not present

## 2024-02-29 DIAGNOSIS — M778 Other enthesopathies, not elsewhere classified: Secondary | ICD-10-CM | POA: Diagnosis not present

## 2024-02-29 DIAGNOSIS — M12811 Other specific arthropathies, not elsewhere classified, right shoulder: Secondary | ICD-10-CM | POA: Diagnosis not present

## 2024-02-29 DIAGNOSIS — M75101 Unspecified rotator cuff tear or rupture of right shoulder, not specified as traumatic: Secondary | ICD-10-CM | POA: Diagnosis not present

## 2024-02-29 DIAGNOSIS — M19011 Primary osteoarthritis, right shoulder: Secondary | ICD-10-CM | POA: Diagnosis not present

## 2024-03-01 DIAGNOSIS — Z9889 Other specified postprocedural states: Secondary | ICD-10-CM | POA: Diagnosis not present

## 2024-03-01 DIAGNOSIS — I4819 Other persistent atrial fibrillation: Secondary | ICD-10-CM | POA: Diagnosis not present

## 2024-03-01 DIAGNOSIS — Z8679 Personal history of other diseases of the circulatory system: Secondary | ICD-10-CM | POA: Diagnosis not present

## 2024-03-06 DIAGNOSIS — M7072 Other bursitis of hip, left hip: Secondary | ICD-10-CM | POA: Diagnosis not present

## 2024-03-06 DIAGNOSIS — M6281 Muscle weakness (generalized): Secondary | ICD-10-CM | POA: Diagnosis not present

## 2024-03-06 DIAGNOSIS — M75101 Unspecified rotator cuff tear or rupture of right shoulder, not specified as traumatic: Secondary | ICD-10-CM | POA: Diagnosis not present

## 2024-03-06 DIAGNOSIS — R2689 Other abnormalities of gait and mobility: Secondary | ICD-10-CM | POA: Diagnosis not present

## 2024-03-06 DIAGNOSIS — M12811 Other specific arthropathies, not elsewhere classified, right shoulder: Secondary | ICD-10-CM | POA: Diagnosis not present

## 2024-03-06 DIAGNOSIS — M25511 Pain in right shoulder: Secondary | ICD-10-CM | POA: Diagnosis not present

## 2024-03-08 DIAGNOSIS — M75101 Unspecified rotator cuff tear or rupture of right shoulder, not specified as traumatic: Secondary | ICD-10-CM | POA: Diagnosis not present

## 2024-03-08 DIAGNOSIS — M25511 Pain in right shoulder: Secondary | ICD-10-CM | POA: Diagnosis not present

## 2024-03-08 DIAGNOSIS — M7072 Other bursitis of hip, left hip: Secondary | ICD-10-CM | POA: Diagnosis not present

## 2024-03-08 DIAGNOSIS — R2689 Other abnormalities of gait and mobility: Secondary | ICD-10-CM | POA: Diagnosis not present

## 2024-03-08 DIAGNOSIS — M12811 Other specific arthropathies, not elsewhere classified, right shoulder: Secondary | ICD-10-CM | POA: Diagnosis not present

## 2024-03-08 DIAGNOSIS — M6281 Muscle weakness (generalized): Secondary | ICD-10-CM | POA: Diagnosis not present

## 2024-03-10 DIAGNOSIS — M7072 Other bursitis of hip, left hip: Secondary | ICD-10-CM | POA: Diagnosis not present

## 2024-03-10 DIAGNOSIS — M6281 Muscle weakness (generalized): Secondary | ICD-10-CM | POA: Diagnosis not present

## 2024-03-10 DIAGNOSIS — M75101 Unspecified rotator cuff tear or rupture of right shoulder, not specified as traumatic: Secondary | ICD-10-CM | POA: Diagnosis not present

## 2024-03-10 DIAGNOSIS — R2689 Other abnormalities of gait and mobility: Secondary | ICD-10-CM | POA: Diagnosis not present

## 2024-03-10 DIAGNOSIS — M25511 Pain in right shoulder: Secondary | ICD-10-CM | POA: Diagnosis not present

## 2024-03-10 DIAGNOSIS — M12811 Other specific arthropathies, not elsewhere classified, right shoulder: Secondary | ICD-10-CM | POA: Diagnosis not present

## 2024-03-15 DIAGNOSIS — M12811 Other specific arthropathies, not elsewhere classified, right shoulder: Secondary | ICD-10-CM | POA: Diagnosis not present

## 2024-03-15 DIAGNOSIS — M75101 Unspecified rotator cuff tear or rupture of right shoulder, not specified as traumatic: Secondary | ICD-10-CM | POA: Diagnosis not present

## 2024-03-15 DIAGNOSIS — M25511 Pain in right shoulder: Secondary | ICD-10-CM | POA: Diagnosis not present

## 2024-03-17 DIAGNOSIS — M12811 Other specific arthropathies, not elsewhere classified, right shoulder: Secondary | ICD-10-CM | POA: Diagnosis not present

## 2024-03-17 DIAGNOSIS — M75101 Unspecified rotator cuff tear or rupture of right shoulder, not specified as traumatic: Secondary | ICD-10-CM | POA: Diagnosis not present

## 2024-03-17 DIAGNOSIS — M25511 Pain in right shoulder: Secondary | ICD-10-CM | POA: Diagnosis not present

## 2024-03-20 DIAGNOSIS — E1142 Type 2 diabetes mellitus with diabetic polyneuropathy: Secondary | ICD-10-CM | POA: Diagnosis not present

## 2024-03-20 DIAGNOSIS — Z8679 Personal history of other diseases of the circulatory system: Secondary | ICD-10-CM | POA: Diagnosis not present

## 2024-03-20 DIAGNOSIS — I480 Paroxysmal atrial fibrillation: Secondary | ICD-10-CM | POA: Diagnosis not present

## 2024-03-20 DIAGNOSIS — E1169 Type 2 diabetes mellitus with other specified complication: Secondary | ICD-10-CM | POA: Diagnosis not present

## 2024-03-20 DIAGNOSIS — E785 Hyperlipidemia, unspecified: Secondary | ICD-10-CM | POA: Diagnosis not present

## 2024-03-20 DIAGNOSIS — M75101 Unspecified rotator cuff tear or rupture of right shoulder, not specified as traumatic: Secondary | ICD-10-CM | POA: Diagnosis not present

## 2024-03-20 DIAGNOSIS — Z8673 Personal history of transient ischemic attack (TIA), and cerebral infarction without residual deficits: Secondary | ICD-10-CM | POA: Diagnosis not present

## 2024-03-20 DIAGNOSIS — Z9889 Other specified postprocedural states: Secondary | ICD-10-CM | POA: Diagnosis not present

## 2024-03-20 DIAGNOSIS — I4819 Other persistent atrial fibrillation: Secondary | ICD-10-CM | POA: Diagnosis not present

## 2024-03-20 DIAGNOSIS — R0602 Shortness of breath: Secondary | ICD-10-CM | POA: Diagnosis not present

## 2024-03-20 DIAGNOSIS — R079 Chest pain, unspecified: Secondary | ICD-10-CM | POA: Diagnosis not present

## 2024-03-20 DIAGNOSIS — M25511 Pain in right shoulder: Secondary | ICD-10-CM | POA: Diagnosis not present

## 2024-03-20 DIAGNOSIS — Z794 Long term (current) use of insulin: Secondary | ICD-10-CM | POA: Diagnosis not present

## 2024-03-20 DIAGNOSIS — M12811 Other specific arthropathies, not elsewhere classified, right shoulder: Secondary | ICD-10-CM | POA: Diagnosis not present

## 2024-03-22 DIAGNOSIS — E785 Hyperlipidemia, unspecified: Secondary | ICD-10-CM | POA: Diagnosis not present

## 2024-03-22 DIAGNOSIS — E1159 Type 2 diabetes mellitus with other circulatory complications: Secondary | ICD-10-CM | POA: Diagnosis not present

## 2024-03-22 DIAGNOSIS — Z794 Long term (current) use of insulin: Secondary | ICD-10-CM | POA: Diagnosis not present

## 2024-03-22 DIAGNOSIS — E1169 Type 2 diabetes mellitus with other specified complication: Secondary | ICD-10-CM | POA: Diagnosis not present

## 2024-03-22 DIAGNOSIS — E1142 Type 2 diabetes mellitus with diabetic polyneuropathy: Secondary | ICD-10-CM | POA: Diagnosis not present

## 2024-03-22 DIAGNOSIS — I152 Hypertension secondary to endocrine disorders: Secondary | ICD-10-CM | POA: Diagnosis not present

## 2024-03-22 LAB — HEMOGLOBIN A1C: Hemoglobin A1C: 5.5

## 2024-03-24 DIAGNOSIS — M66821 Spontaneous rupture of other tendons, right upper arm: Secondary | ICD-10-CM | POA: Diagnosis not present

## 2024-03-24 DIAGNOSIS — E119 Type 2 diabetes mellitus without complications: Secondary | ICD-10-CM | POA: Diagnosis not present

## 2024-03-24 DIAGNOSIS — M19211 Secondary osteoarthritis, right shoulder: Secondary | ICD-10-CM | POA: Diagnosis not present

## 2024-03-24 DIAGNOSIS — M1990 Unspecified osteoarthritis, unspecified site: Secondary | ICD-10-CM | POA: Diagnosis not present

## 2024-03-24 DIAGNOSIS — M75121 Complete rotator cuff tear or rupture of right shoulder, not specified as traumatic: Secondary | ICD-10-CM | POA: Diagnosis not present

## 2024-03-30 DIAGNOSIS — B351 Tinea unguium: Secondary | ICD-10-CM | POA: Diagnosis not present

## 2024-03-30 DIAGNOSIS — E1142 Type 2 diabetes mellitus with diabetic polyneuropathy: Secondary | ICD-10-CM | POA: Diagnosis not present

## 2024-04-10 ENCOUNTER — Other Ambulatory Visit: Payer: Self-pay | Admitting: Dermatology

## 2024-04-27 DIAGNOSIS — H18423 Band keratopathy, bilateral: Secondary | ICD-10-CM | POA: Diagnosis not present

## 2024-04-27 DIAGNOSIS — H401122 Primary open-angle glaucoma, left eye, moderate stage: Secondary | ICD-10-CM | POA: Diagnosis not present

## 2024-04-27 DIAGNOSIS — H1012 Acute atopic conjunctivitis, left eye: Secondary | ICD-10-CM | POA: Diagnosis not present

## 2024-04-27 DIAGNOSIS — H2702 Aphakia, left eye: Secondary | ICD-10-CM | POA: Diagnosis not present

## 2024-05-20 ENCOUNTER — Other Ambulatory Visit: Payer: Self-pay | Admitting: Family Medicine

## 2024-05-20 DIAGNOSIS — I10 Essential (primary) hypertension: Secondary | ICD-10-CM

## 2024-05-20 DIAGNOSIS — I152 Hypertension secondary to endocrine disorders: Secondary | ICD-10-CM

## 2024-05-23 NOTE — Telephone Encounter (Signed)
 Requested medications are due for refill today.  yes  Requested medications are on the active medications list.  yes  Last refill. 02/25/2024 #90 0 rf  Future visit scheduled.   yes  Notes to clinic.  Labs are expired.    Requested Prescriptions  Pending Prescriptions Disp Refills   telmisartan -hydrochlorothiazide  (MICARDIS  HCT) 80-12.5 MG tablet [Pharmacy Med Name: Telmisartan -HCTZ 80-12.5 MG Oral Tablet] 90 tablet 0    Sig: Take 1 tablet by mouth once daily     Cardiovascular: ARB + Diuretic Combos Failed - 05/23/2024 11:01 AM      Failed - K in normal range and within 180 days    Potassium  Date Value Ref Range Status  06/21/2023 4.2 3.5 - 5.3 mmol/L Final  03/14/2013 3.7 3.5 - 5.1 mmol/L Final         Failed - Na in normal range and within 180 days    Sodium  Date Value Ref Range Status  06/21/2023 141 135 - 146 mmol/L Final  08/28/2015 137 134 - 144 mmol/L Final  03/14/2013 138 136 - 145 mmol/L Final         Failed - Cr in normal range and within 180 days    Creat  Date Value Ref Range Status  06/21/2023 0.86 0.70 - 1.22 mg/dL Final   Creatinine, Urine  Date Value Ref Range Status  06/21/2023 44 20 - 320 mg/dL Final         Failed - eGFR is 10 or above and within 180 days    GFR, Est African American  Date Value Ref Range Status  02/28/2020 99 > OR = 60 mL/min/1.73m2 Final   GFR, Est Non African American  Date Value Ref Range Status  02/28/2020 86 > OR = 60 mL/min/1.104m2 Final   GFR, Estimated  Date Value Ref Range Status  04/08/2023 >60 >60 mL/min Final    Comment:    (NOTE) Calculated using the CKD-EPI Creatinine Equation (2021)    eGFR  Date Value Ref Range Status  06/21/2023 85 > OR = 60 mL/min/1.23m2 Final         Passed - Patient is not pregnant      Passed - Last BP in normal range    BP Readings from Last 1 Encounters:  02/23/24 118/68         Passed - Valid encounter within last 6 months    Recent Outpatient Visits           3  months ago Cough, unspecified type   [facility] Leavy Mole, PA-C   5 months ago Hypertension associated with diabetes [facility])   [facility] Glenard Mire, MD   6 months ago Open wound of left lower leg, subsequent encounter   [facility] Gareth Mliss FALCON, FNP   7 months ago Open wound of left lower leg, subsequent encounter   [facility] Glenard Mire, MD   7 months ago Open wound of left lower leg, subsequent encounter   [facility] Glenard Mire, MD       Future Appointments             In 1 month Sowles, Krichna, MD [facility] [place], [place]   In 5 months Hester Alm BROCKS, MD [facility]

## 2024-05-25 ENCOUNTER — Ambulatory Visit: Payer: Self-pay

## 2024-05-25 DIAGNOSIS — Z0000 Encounter for general adult medical examination without abnormal findings: Secondary | ICD-10-CM | POA: Diagnosis not present

## 2024-05-25 NOTE — Patient Instructions (Addendum)
 Dr. Chad,  Thank you for taking the time for your Medicare Wellness Visit. I appreciate your continued commitment to your health goals. Please review the care plan we discussed, and feel free to reach out if I can assist you further.  Please note that Annual Wellness Visits do not include a physical exam. Some assessments may be limited, especially if the visit was conducted virtually. If needed, we may recommend an in-person follow-up with your provider.  Ongoing Care Seeing your primary care provider every 3 to 6 months helps us  monitor your health and provide consistent, personalized care.   Referrals If a referral was made during today's visit and you haven't received any updates within two weeks, please contact the referred provider directly to check on the status.  Recommended Screenings:  Health Maintenance  Topic Date Due   Complete foot exam   04/10/2023   COVID-19 Vaccine (7 - 2025-26 season) 02/14/2024   Hemoglobin A1C  05/19/2024   Eye exam for diabetics  11/03/2024   Medicare Annual Wellness Visit  05/25/2025   Colon Cancer Screening  12/22/2026   DTaP/Tdap/Td vaccine (3 - Td or Tdap) 03/29/2032   Pneumococcal Vaccine for age over 56  Completed   Flu Shot  Completed   Zoster (Shingles) Vaccine  Completed   Meningitis B Vaccine  Aged Out       05/25/2024    3:52 PM  Advanced Directives  Does Patient Have a Medical Advance Directive? Yes  Type of Estate Agent of [place];Living will  Does patient want to make changes to medical advance directive? No - Patient declined  Copy of Healthcare Power of Attorney in Chart? Yes - validated most recent copy scanned in chart (See row information)    Vision: Annual vision screenings are recommended for early detection of glaucoma, cataracts, and diabetic retinopathy. These exams can also reveal signs of chronic conditions such as diabetes and high blood pressure.  Dental: Annual dental screenings help  detect early signs of oral cancer, gum disease, and other conditions linked to overall health, including heart disease and diabetes.  Please see the attached documents for additional preventive care recommendations.   NEXT AWV 05/31/25 @ 3:00 PM IN PERSON

## 2024-05-25 NOTE — Progress Notes (Signed)
 No chief complaint on file.    Subjective:   Jeremy [name] is a 88 y.o. male who presents for a Medicare Annual Wellness Visit.  Visit info / Clinical Intake: Interpreter Needed?: No  Functional Status Activities of Daily Living (to include ambulation/medication): Independent Ambulation: Independent Home Assistive Devices/Equipment: Cane Medication Administration: Independent Home Management (perform basic housework or laundry): Independent  Fall Screening Falls in the past year?: 0 Number of falls in past year: 0 Was there an injury with Fall?: 0 Fall Risk Category Calculator: 0 Patient Fall Risk Level: Moderate fall risk  Fall Risk Patient at Risk for Falls Due to: No Fall Risks Fall risk Follow up: Falls evaluation completed  Advance Directives (For Healthcare) Does Patient Have a Medical Advance Directive?: Yes Type of Advance Directive: Healthcare Power of [place]; Living will    Allergies (verified) Demerol [meperidine], Metoprolol, Dilaudid [hydromorphone hcl], Actos [pioglitazone], Dapagliflozin, and Penicillins   Current Medications (verified) Outpatient Encounter Medications as of 05/25/2024  Medication Sig   acetaminophen  (TYLENOL ) 500 MG tablet Take 1,000 mg by mouth 2 (two) times daily as needed for moderate pain (pain score 4-6).   apixaban (ELIQUIS) 5 MG TABS tablet Take 5 mg by mouth 2 (two) times daily.   benzonatate  (TESSALON ) 100 MG capsule Take 1-2 capsules (100-200 mg total) by mouth 3 (three) times daily as needed for cough.   brimonidine-timolol (COMBIGAN) 0.2-0.5 % ophthalmic solution Place 1 drop into the left eye 2 (two) times daily.   chlorpheniramine-HYDROcodone (TUSSIONEX) 10-8 MG/5ML Take 2.5-5 mLs by mouth at bedtime as needed for cough.   diclofenac  Sodium (VOLTAREN ) 1 % GEL Apply 4 g topically 4 (four) times daily.   diltiazem (CARDIZEM CD) 120 MG 24 hr capsule Take 120 mg by mouth daily.   dorzolamide (TRUSOPT) 2 % ophthalmic  solution Place 1 drop into the left eye 2 (two) times daily.   empagliflozin (JARDIANCE) 25 MG TABS tablet Take 25 mg by mouth daily.   finasteride  (PROSCAR ) 5 MG tablet Take 1 tablet (5 mg total) by mouth daily.   flecainide (TAMBOCOR) 50 MG tablet Take 100 mg by mouth 2 (two) times daily.   glipiZIDE (GLUCOTROL) 10 MG tablet Take 10 mg by mouth daily.   hydrocortisone  2.5 % cream APPLY TO THE AFFECTED AREA OF FACE AND EARS EVERY OTHER NIGHT ALTERNATING WITH KETOCONAZOLE  CREAM   insulin degludec (TRESIBA FLEXTOUCH) 200 UNIT/ML FlexTouch Pen Inject 22 Units into the skin in the morning.   ketoconazole  (NIZORAL ) 2 % cream Apply two grams to the face every other day alternating with hydrocortisone  2.5% cream every other day.   ketoconazole  (NIZORAL ) 2 % shampoo Apply 1 Application topically once a week. [place]   loperamide (IMODIUM A-D) 2 MG tablet Take 2 mg by mouth as needed for diarrhea or loose stools.   loratadine (CLARITIN) 10 MG tablet Take 10 mg by mouth daily.   metFORMIN  (GLUCOPHAGE ) 1000 MG tablet Take 1,000 mg by mouth 2 (two) times daily with a meal. On Monday and Wednesday   mometasone  (ELOCON ) 0.1 % lotion APPLY TOPICALLY TO ITCHY AREAS OF SCALP ONCE TO TWICE DAILY AS NEEDED   MOUNJARO 2.5 MG/0.5ML Pen SMARTSIG:0.5 Milliliter(s) SUB-Q Once a Week   Multiple Vitamin (MULTIVITAMIN) tablet Take 1 tablet by mouth daily.   omega-3 acid ethyl esters (LOVAZA ) 1 g capsule Take 2 capsules (2 g total) by mouth 2 (two) times daily.   omeprazole  (PRILOSEC) 20 MG capsule Take 1 capsule (20 mg total) by  mouth daily.   rosuvastatin  (CRESTOR ) 10 MG tablet Take 1 tablet (10 mg total) by mouth at bedtime.   tadalafil  (CIALIS ) 20 MG tablet Take 1 tablet (20 mg total) by mouth daily as needed for erectile dysfunction.   telmisartan -hydrochlorothiazide  (MICARDIS  HCT) 80-12.5 MG tablet Take 1 tablet by mouth once daily   No facility-administered encounter medications on file as of 05/25/2024.     History: Past Medical History:  Diagnosis Date   Allergy    Basal cell carcinoma 10/09/2021   right medial forehead at glabella, Mohs 01/22/22   Chronic kidney disease    Diabetes mellitus without complication (HCC)    Diabetic neuropathy (HCC)    Diverticula, colon 1980's   Diverticulosis    Enlarged prostate    GERD (gastroesophageal reflux disease)    Glaucoma    Hemorrhoids    High cholesterol    History of hiatal hernia    Hyperlipidemia    Hypertension    Obesity    Onychomycosis    Seizures (HCC)    Small bowel obstruction (HCC) 1990's   Urination disorder    Past Surgical History:  Procedure Laterality Date   APPENDECTOMY     BLADDER DIVERTICULECTOMY  2013   CARDIOVERSION N/A 02/24/2023   Procedure: CARDIOVERSION;  Surgeon: Ammon Blunt, MD;  Location: [facility] OR[facility];  Service: Cardiovascular;  Laterality: N/A;   CARDIOVERSION N/A 04/21/2023   Procedure: CARDIOVERSION;  Surgeon: Ammon Blunt, MD;  Location: [facility];  Service: Cardiovascular;  Laterality: N/A;   CARPAL TUNNEL RELEASE  01/2022   revision right side   COLON SURGERY Left 1980's   COLONOSCOPY N/A 12/22/2023   Procedure: COLONOSCOPY;  Surgeon: Toledo, Ladell POUR, MD;  Location: [facility] ENDOSCOPY;  Service: Gastroenterology;  Laterality: N/A;  DM   COLONOSCOPY W/ POLYPECTOMY  1990's   Dr Rollene   COLONOSCOPY WITH PROPOFOL  N/A 12/21/2014   Procedure: COLONOSCOPY WITH PROPOFOL ;  Surgeon: Gladis RAYMOND Mariner, MD;  Location: [facility] ENDOSCOPY;  Service: Endoscopy;  Laterality: N/A;   COLONOSCOPY WITH PROPOFOL  N/A 08/16/2017   Procedure: COLONOSCOPY WITH PROPOFOL ;  Surgeon: Mariner Gladis RAYMOND, MD;  Location: [facility]COPY;  Service: Endoscopy;  Laterality: N/A;   COLONOSCOPY WITH PROPOFOL  N/A 01/31/2021   Procedure: COLONOSCOPY WITH PROPOFOL ;  Surgeon: Dessa Reyes ORN, MD;  Location: [facility] ENDOSCOPY;  Service: Endoscopy;  Laterality: N/A;   ESOPHAGOGASTRODUODENOSCOPY N/A 12/22/2023   Procedure: EGD  (ESOPHAGOGASTRODUODENOSCOPY);  Surgeon: Toledo, Ladell POUR, MD;  Location: [facility] ENDOSCOPY;  Service: Gastroenterology;  Laterality: N/A;   ESOPHAGOGASTRODUODENOSCOPY (EGD) WITH PROPOFOL  N/A 02/15/2015   Procedure: ESOPHAGOGASTRODUODENOSCOPY (EGD) WITH PROPOFOL ;  Surgeon: Gladis RAYMOND Mariner, MD;  Location: [facility] ENDOSCOPY;  Service: Endoscopy;  Laterality: N/A;   ESOPHAGOGASTRODUODENOSCOPY (EGD) WITH PROPOFOL  N/A 06/10/2017   Procedure: ESOPHAGOGASTRODUODENOSCOPY (EGD) WITH PROPOFOL ;  Surgeon: Mariner Gladis RAYMOND, MD;  Location: [facility] ENDOSCOPY;  Service: Endoscopy;  Laterality: N/A;   ESOPHAGOGASTRODUODENOSCOPY (EGD) WITH PROPOFOL  N/A 08/16/2017   Procedure: ESOPHAGOGASTRODUODENOSCOPY (EGD) WITH PROPOFOL ;  Surgeon: Mariner Gladis RAYMOND, MD;  Location: [facility] ENDOSCOPY;  Service: Endoscopy;  Laterality: N/A;   ESOPHAGOGASTRODUODENOSCOPY (EGD) WITH PROPOFOL  N/A 01/31/2021   Procedure: ESOPHAGOGASTRODUODENOSCOPY (EGD) WITH PROPOFOL ;  Surgeon: Dessa Reyes ORN, MD;  Location: [facility] ENDOSCOPY;  Service: Endoscopy;  Laterality: N/A;   EYE SURGERY     GLAUCOMA SURGERY Left 1990's   [facility]   HEMORROIDECTOMY  1980's   [facility]   HERNIA REPAIR     [facility], Dr Rollene FONTANA HERNIA REPAIR Bilateral [phone]   LARYNX SURGERY  1990   3 times   MOHS SURGERY  01/22/2022   Dr. 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)   Tobacco Use   Smoking status: Never    Passive exposure: Never   Smokeless tobacco: Never   Tobacco comments:    smoking cessation materials not required  Vaping Use   Vaping status: Never Used  Substance and Sexual Activity   Alcohol use: No   Drug use: No   Sexual activity: Yes   Tobacco Counseling Counseling given: Not Answered Tobacco comments: smoking cessation materials not required  SDOH Screenings   Food Insecurity: No Food Insecurity (05/06/2023)  Housing: Unknown (03/22/2024)   [facility] from [facility]  Transportation Needs: No Transportation Needs (05/06/2023)  Utilities: Not At Risk (05/06/2023)  Alcohol Screen: Low Risk (05/06/2023)  Depression (PHQ2-9): Low Risk (12/21/2023)  Financial Resource Strain: Low Risk (05/06/2023)  Physical Activity: Sufficiently Active (05/06/2023)  Social Connections: Moderately Integrated (05/06/2023)  Stress: No Stress Concern Present (05/06/2023)  Tobacco Use: Low Risk  (03/30/2024)   Received from [facility]  Health Literacy: Adequate Health Literacy (05/06/2023)   See flowsheets for full screening details  Depression Screen PHQ 2 & 9 Depression Scale- Over the past 2 weeks, how often have you been bothered by any of the following problems? Little interest or pleasure in doing things: 0 Feeling down, depressed, or hopeless (PHQ Adolescent also includes...irritable): 0 PHQ-2 Total Score: 0 Trouble falling or staying asleep, or sleeping too much: 0 Feeling tired or having little energy: 0 Poor appetite or overeating (PHQ Adolescent also includes...weight loss): 0 Feeling bad about yourself - or that you are a failure or have let yourself or your family down: 0 Trouble concentrating on things, such as reading the newspaper or watching television (PHQ Adolescent also includes...like school work): 0 Moving or speaking so slowly that other people could have  noticed. Or the opposite - being so fidgety or restless that you have been moving around a lot more than usual: 0 Thoughts that you would be better off dead, or of hurting yourself in some way: 0 PHQ-9 Total Score: 0 If you checked off any problems, how difficult have these problems made it for you to do your work, take care of things at home, or get along with other people?: Not difficult at all     Goals Addressed   None          Objective:    There were no vitals filed for this visit. There is no height or weight on file to calculate BMI.  Hearing/Vision screen No results found.  Immunizations and Health Maintenance Health Maintenance  Topic Date Due   FOOT EXAM  04/10/2023   COVID-19 Vaccine (7 - 2025-26 season) 02/14/2024   Medicare Annual Wellness (AWV)  05/05/2024   HEMOGLOBIN A1C  05/19/2024   OPHTHALMOLOGY EXAM  11/03/2024   Colonoscopy  12/22/2026   DTaP/Tdap/Td (3 - Td or Tdap) 03/29/2032   Pneumococcal Vaccine: 50+ Years  Completed   Influenza Vaccine  Completed   Zoster Vaccines- Shingrix  Completed   Meningococcal B Vaccine  Aged Out        Assessment/Plan:  This is a routine wellness examination for Lyn[place].  Patient Care Team: Sowles, Krichna, MD as PCP - General (Family Medicine) Requested, Self Jaye Fallow, MD as Consulting Physician (Ophthalmology) Cleotilde Barrio, MD as Consulting Physician (Orthopedic Surgery) Ammon Blunt, MD as Consulting Physician (Cardiology) Cherilyn Debby CROME, MD as Consulting Physician (Internal Medicine) Neill Boas, DPM (Podiatry) Hester Alm BROCKS, MD (Dermatology) Jacobo Evalene PARAS, MD as Consulting Physician (Oncology)  I have personally reviewed and noted the following in the patients chart:   Medical and social history Use of alcohol, tobacco or illicit drugs  Current medications and supplements including opioid prescriptions. Functional ability and status Nutritional status Physical  activity Advanced directives List of other physicians Hospitalizations, surgeries, and ER visits in previous 12 months Vitals Screenings to include cognitive, depression, and falls Referrals and appointments  No orders of the defined types were placed in this encounter.  In addition, I have reviewed and discussed with patient certain preventive protocols, quality metrics, and best practice recommendations. A written personalized care plan for preventive services as well as general preventive health recommendations were provided to patient.   Jhonnie GORMAN Das, LPN   [id number]   No follow-ups on file.  After Visit Summary: (MyChart) Due to this being a telephonic visit, the after visit summary with patients personalized plan was offered to patient via MyChart   Nurse Notes: UTD ON SHOTS; AGED OUT OF COLONOSCOPY  No voiced or noted concerns at this time

## 2024-05-30 NOTE — Progress Notes (Signed)
 Chief Complaint  Patient presents with   Medicare Wellness     Subjective:   Jeremy [name] is a 88 y.o. male who presents for a Medicare Annual Wellness Visit.  Visit info / Clinical Intake: Medicare Wellness Visit Type:: Subsequent Annual Wellness Visit Persons participating in visit and providing information:: patient Medicare Wellness Visit Mode:: Telephone If telephone:: video declined Since this visit was completed virtually, some vitals may be partially provided or unavailable. Missing vitals are due to the limitations of the virtual format.: Unable to obtain vitals - no equipment If Telephone or Video please confirm:: I connected with patient using audio/video enable telemedicine. I verified patient identity with two identifiers, discussed telehealth limitations, and patient agreed to proceed. Patient Location:: HOME Provider Location:: OFFICE Interpreter Needed?: No Pre-visit prep was completed: yes AWV questionnaire completed by patient prior to visit?: no Living arrangements:: (!) lives alone (WIFE HAS DEMENTIA- LIVES @ [facility]) Patient's Overall Health Status Rating: very good Typical amount of pain: some (RIGHT SHOULDER) Does pain affect daily life?: no Are you currently prescribed opioids?: no  Dietary Habits and Nutritional Risks How many meals a day?: 3 Eats fruit and vegetables daily?: yes Most meals are obtained by: preparing own meals In the last 2 weeks, have you had any of the following?: none Diabetic:: (!) yes Any non-healing wounds?: no How often do you check your BS?: continuous glucose monitor Would you like to be referred to a Nutritionist or for Diabetic Management? : no  Functional Status Activities of Daily Living (to include ambulation/medication): Independent Ambulation: Independent Home Assistive Devices/Equipment: Cane Medication Administration: Independent Home Management (perform basic housework or laundry): Independent Manage your  own finances?: yes Primary transportation is: driving Concerns about vision?: no *vision screening is required for WTM* (WEARS GLASSES- DR.PORFILIO- SEEN EVERY YEAR) Concerns about hearing?: no  Fall Screening Falls in the past year?: 0 Number of falls in past year: 0 Was there an injury with Fall?: 0 Fall Risk Category Calculator: 0 Patient Fall Risk Level: Low Fall Risk  Fall Risk Patient at Risk for Falls Due to: No Fall Risks Fall risk Follow up: Falls evaluation completed; Falls prevention discussed  Home and Transportation Safety: All rugs have non-skid backing?: yes All stairs or steps have railings?: yes Grab bars in the bathtub or shower?: yes Have non-skid surface in bathtub or shower?: yes Good home lighting?: yes Regular seat belt use?: yes Hospital stays in the last year:: no  Cognitive Assessment Difficulty concentrating, remembering, or making decisions? : no Will 6CIT or Mini Cog be Completed: yes What year is it?: 0 points What month is it?: 0 points Give patient an address phrase to remember (5 components): [address] [place], [place] what time is it?: 0 points Count backwards from 20 to 1: 0 points Say the months of the year in reverse: 0 points Repeat the address phrase from earlier: 0 points 6 CIT Score: 0 points  Advance Directives (For Healthcare) Does Patient Have a Medical Advance Directive?: Yes Does patient want to make changes to medical advance directive?: No - Patient declined Type of Advance Directive: Healthcare Power of [place]; Living will Copy of Healthcare Power of Attorney in Chart?: Yes - validated most recent copy scanned in chart (See row information) Copy of Living Will in Chart?: Yes - validated most recent copy scanned in chart (See row information)  Reviewed/Updated  Reviewed/Updated: Reviewed All (Medical, Surgical, Family, Medications, Allergies, Care Teams, Patient Goals)  Allergies (verified) Demerol  [meperidine], Metoprolol, Dilaudid [hydromorphone hcl], Actos [pioglitazone], Dapagliflozin, and Penicillins   Current Medications (verified) Outpatient Encounter Medications as of 05/25/2024  Medication Sig   acetaminophen  (TYLENOL ) 500 MG tablet Take 1,000 mg by mouth 2 (two) times daily as needed for moderate pain (pain score 4-6).   apixaban (ELIQUIS) 5 MG TABS tablet Take 5 mg by mouth 2 (two) times daily.   benzonatate  (TESSALON ) 100 MG capsule Take 1-2 capsules (100-200 mg total) by mouth 3 (three) times daily as needed for cough.   brimonidine-timolol (COMBIGAN) 0.2-0.5 % ophthalmic solution Place 1 drop into the left eye 2 (two) times daily.   chlorpheniramine-HYDROcodone (TUSSIONEX) 10-8 MG/5ML Take 2.5-5 mLs by mouth at bedtime as needed for cough.   diclofenac  Sodium (VOLTAREN ) 1 % GEL Apply 4 g topically 4 (four) times daily.   diltiazem (CARDIZEM CD) 120 MG 24 hr capsule Take 120 mg by mouth daily.   dorzolamide (TRUSOPT) 2 % ophthalmic solution Place 1 drop into the left eye 2 (two) times daily.   empagliflozin (JARDIANCE) 25 MG TABS tablet Take 25 mg by mouth daily.   finasteride  (PROSCAR ) 5 MG tablet Take 1 tablet (5 mg total) by mouth daily.   flecainide (TAMBOCOR) 50 MG tablet Take 100 mg by mouth 2 (two) times daily.   glipiZIDE (GLUCOTROL) 10 MG tablet Take 10 mg by mouth daily.   hydrocortisone  2.5 % cream APPLY TO THE AFFECTED AREA OF FACE AND EARS EVERY OTHER NIGHT ALTERNATING WITH KETOCONAZOLE  CREAM   insulin degludec (TRESIBA FLEXTOUCH) 200 UNIT/ML FlexTouch Pen Inject 22 Units into the skin in the morning.   ketoconazole  (NIZORAL ) 2 % cream Apply two grams to the face every other day alternating with hydrocortisone  2.5% cream every other day.   ketoconazole  (NIZORAL ) 2 % shampoo Apply 1 Application topically once a week. [place]   loperamide (IMODIUM A-D) 2 MG tablet Take 2 mg by mouth as needed for diarrhea or loose stools.   loratadine (CLARITIN) 10 MG tablet Take 10  mg by mouth daily.   metFORMIN  (GLUCOPHAGE ) 1000 MG tablet Take 1,000 mg by mouth 2 (two) times daily with a meal. On Monday and Wednesday (Patient taking differently: Take 500 mg by mouth 2 (two) times daily with a meal. TAKES ONCE PER DAY)   mometasone  (ELOCON ) 0.1 % lotion APPLY TOPICALLY TO ITCHY AREAS OF SCALP ONCE TO TWICE DAILY AS NEEDED   MOUNJARO 2.5 MG/0.5ML Pen SMARTSIG:0.5 Milliliter(s) SUB-Q Once a Week   Multiple Vitamin (MULTIVITAMIN) tablet Take 1 tablet by mouth daily.   omega-3 acid ethyl esters (LOVAZA ) 1 g capsule Take 2 capsules (2 g total) by mouth 2 (two) times daily.   omeprazole  (PRILOSEC) 20 MG capsule Take 1 capsule (20 mg total) by mouth daily.   rosuvastatin  (CRESTOR ) 10 MG tablet Take 1 tablet (10 mg total) by mouth at bedtime.   tadalafil  (CIALIS ) 20 MG tablet Take 1 tablet (20 mg total) by mouth daily as needed for erectile dysfunction.   telmisartan -hydrochlorothiazide  (MICARDIS  HCT) 80-12.5 MG tablet Take 1 tablet by mouth once daily   No facility-administered encounter medications on file as of 05/25/2024.    History: Past Medical History:  Diagnosis Date   Allergy    Basal cell carcinoma 10/09/2021   right medial forehead at glabella, Mohs 01/22/22   Chronic kidney disease    Diabetes mellitus without complication (HCC)    Diabetic neuropathy (HCC)    Diverticula, colon 1980's   Diverticulosis  Enlarged prostate    GERD (gastroesophageal reflux disease)    Glaucoma    Hemorrhoids    High cholesterol    History of hiatal hernia    Hyperlipidemia    Hypertension    Obesity    Onychomycosis    Seizures (HCC)    Small bowel obstruction (HCC) 1990's   Urination disorder    Past Surgical History:  Procedure Laterality Date   APPENDECTOMY     BLADDER DIVERTICULECTOMY  2013   CARDIOVERSION N/A 02/24/2023   Procedure: CARDIOVERSION;  Surgeon: Ammon Blunt, MD;  Location: [facility] ORS;  Service: Cardiovascular;  Laterality: N/A;    CARDIOVERSION N/A 04/21/2023   Procedure: CARDIOVERSION;  Surgeon: Ammon Blunt, MD;  Location: [facility];  Service: Cardiovascular;  Laterality: N/A;   CARPAL TUNNEL RELEASE  01/2022   revision right side   COLON SURGERY Left 1980's   COLONOSCOPY N/A 12/22/2023   Procedure: COLONOSCOPY;  Surgeon: Toledo, Ladell POUR, MD;  Location: [facility] ENDOSCOPY;  Service: Gastroenterology;  Laterality: N/A;  DM   COLONOSCOPY W/ POLYPECTOMY  1990's   Dr Rollene   COLONOSCOPY WITH PROPOFOL  N/A 12/21/2014   Procedure: COLONOSCOPY WITH PROPOFOL ;  Surgeon: Gladis RAYMOND Mariner, MD;  Location: [facility] ENDOSCOPY;  Service: Endoscopy;  Laterality: N/A;   COLONOSCOPY WITH PROPOFOL  N/A 08/16/2017   Procedure: COLONOSCOPY WITH PROPOFOL ;  Surgeon: Mariner Gladis RAYMOND, MD;  Location: [facility] ENDOSCOPY;  Service: Endoscopy;  Laterality: N/A;   COLONOSCOPY WITH PROPOFOL  N/A 01/31/2021   Procedure: COLONOSCOPY WITH PROPOFOL ;  Surgeon: Dessa Reyes ORN, MD;  Location: [facility] ENDOSCOPY;  Service: Endoscopy;  Laterality: N/A;   ESOPHAGOGASTRODUODENOSCOPY N/A 12/22/2023   Procedure: EGD (ESOPHAGOGASTRODUODENOSCOPY);  Surgeon: Toledo, Ladell POUR, MD;  Location: [facility] ENDOSCOPY;  Service: Gastroenterology;  Laterality: N/A;   ESOPHAGOGASTRODUODENOSCOPY (EGD) WITH PROPOFOL  N/A 02/15/2015   Procedure: ESOPHAGOGASTRODUODENOSCOPY (EGD) WITH PROPOFOL ;  Surgeon: Gladis RAYMOND Mariner, MD;  Location: [facility] ENDOSCOPY;  Service: Endoscopy;  Laterality: N/A;   ESOPHAGOGASTRODUODENOSCOPY (EGD) WITH PROPOFOL  N/A 06/10/2017   Procedure: ESOPHAGOGASTRODUODENOSCOPY (EGD) WITH PROPOFOL ;  Surgeon: Mariner Gladis RAYMOND, MD;  Location: [facility] ENDOSCOPY;  Service: Endoscopy;  Laterality: N/A;   ESOPHAGOGASTRODUODENOSCOPY (EGD) WITH PROPOFOL  N/A 08/16/2017   Procedure: ESOPHAGOGASTRODUODENOSCOPY (EGD) WITH PROPOFOL ;  Surgeon: Mariner Gladis RAYMOND, MD;  Location: [facility] ENDOSCOPY;  Service: Endoscopy;  Laterality: N/A;   ESOPHAGOGASTRODUODENOSCOPY (EGD) WITH PROPOFOL  N/A  01/31/2021   Procedure: ESOPHAGOGASTRODUODENOSCOPY (EGD) WITH PROPOFOL ;  Surgeon: Dessa Reyes ORN, MD;  Location: [facility] ENDOSCOPY;  Service: Endoscopy;  Laterality: N/A;   EYE SURGERY     GLAUCOMA SURGERY Left 1990's   [facility]   HEMORROIDECTOMY  1980's   [facility]   HERNIA REPAIR     [facility], Dr Rollene FONTANA HERNIA REPAIR Bilateral [phone]   LARYNX SURGERY  1990   3 times   MOHS SURGERY  01/22/2022   Dr. Timm [facility]   REPLACEMENT TOTAL KNEE Right    RETINAL DETACHMENT SURGERY Right    TONSILLECTOMY     turp  2013   URETER SURGERY  2013   Dr Ike   Family History  Problem Relation Age of Onset   Diabetes Mother    Cataracts Mother    Metabolic syndrome Mother    Blindness Mother        r/t diabetes   Cataracts Father    Glaucoma Father        Retina-Detachment   Atrial fibrillation Father    Diverticulitis Sister    Glaucoma Brother    Diabetes  Brother    Cancer Sister        Gallbladder   Cancer Daughter    Social History   Occupational History   Occupation: sports administrator     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)   Tobacco Use   Smoking status: Never    Passive exposure: Never   Smokeless tobacco: Never   Tobacco comments:    smoking cessation materials not required  Vaping Use   Vaping status: Never Used  Substance and Sexual Activity   Alcohol use: No   Drug use: No   Sexual activity: Yes   Tobacco Counseling Counseling given: Not Answered Tobacco comments: smoking cessation materials not required  SDOH Screenings   Food Insecurity: No Food Insecurity (05/06/2023)  Housing: Unknown (03/22/2024)   Received from [facility]  Transportation [name]: No Transportation [name] (05/06/2023)  Utilities: Not At Risk (05/06/2023)  Alcohol Screen: Low Risk (05/06/2023)  Depression (PHQ2-9): Low Risk (05/25/2024)  Financial Resource Strain: Low Risk (05/06/2023)  Physical Activity: Insufficiently Active  (05/25/2024)  Social Connections: Moderately Integrated (05/25/2024)  Stress: No Stress Concern Present (05/25/2024)  Tobacco Use: Low Risk (05/25/2024)  Health Literacy: Adequate Health Literacy (05/06/2023)   See flowsheets for full screening details  Depression Screen PHQ 2 & 9 Depression Scale- Over the past 2 weeks, how often have you been bothered by any of the following problems? Little interest or pleasure in doing things: 0 Feeling down, depressed, or hopeless (PHQ Adolescent also includes...irritable): 0 PHQ-2 Total Score: 0 Trouble falling or staying asleep, or sleeping too much: 0 Feeling tired or having little energy: 0 Poor appetite or overeating (PHQ Adolescent also includes...weight loss): 0 Feeling bad about yourself - or that you are a failure or have let yourself or your family down: 0 Trouble concentrating on things, such as reading the newspaper or watching television (PHQ Adolescent also includes...like school work): 0 Moving or speaking so slowly that other people could have noticed. Or the opposite - being so fidgety or restless that you have been moving around a lot more than usual: 0 Thoughts that you would be better off dead, or of hurting yourself in some way: 0 PHQ-9 Total Score: 0 If you checked off any problems, how difficult have these problems made it for you to do your work, take care of things at home, or get along with other people?: Not difficult at all  Depression Treatment Depression Interventions/Treatment : EYV7-0 Score <4 Follow-up Not Indicated     Goals Addressed             This Visit's Progress    Cut out extra servings               Objective:    There were no vitals filed for this visit. There is no height or weight on file to calculate BMI.  Hearing/Vision screen Hearing Screening - Comments:: No aids Vision Screening - Comments:: Wears glasses- Dr.Porfilio- seen every year Immunizations and Health Maintenance Health  Maintenance  Topic Date Due   FOOT EXAM  04/10/2023   COVID-19 Vaccine (7 - 2025-26 season) 02/14/2024   HEMOGLOBIN A1C  05/19/2024   OPHTHALMOLOGY EXAM  11/03/2024   Medicare Annual Wellness (AWV)  05/25/2025   Colonoscopy  12/22/2026   DTaP/Tdap/Td (3 - Td or Tdap) 03/29/2032   Pneumococcal Vaccine: 50+ Years  Completed   Influenza Vaccine  Completed   Zoster Vaccines- Shingrix  Completed   Meningococcal B Vaccine  Aged Out        Assessment/Plan:  This is a routine wellness examination for Jeremy [name].  Patient Care Team: Sowles, Krichna, MD as PCP - General (Family Medicine) Requested, Self Jaye Fallow, MD as Consulting Physician (Ophthalmology) Cleotilde Barrio, MD as Consulting Physician (Orthopedic Surgery) Ammon Blunt, MD as Consulting Physician (Cardiology) Cherilyn Debby CROME, MD as Consulting Physician (Internal Medicine) Neill Boas, DPM (Podiatry) Hester Alm BROCKS, MD (Dermatology) Jacobo Evalene PARAS, MD as Consulting Physician (Oncology)  I have personally reviewed and noted the following in the patients chart:   Medical and social history Use of alcohol, tobacco or illicit drugs  Current medications and supplements including opioid prescriptions. Functional ability and status Nutritional status Physical activity Advanced directives List of other physicians Hospitalizations, surgeries, and ER visits in previous 12 months Vitals Screenings to include cognitive, depression, and falls Referrals and appointments  No orders of the defined types were placed in this encounter.  In addition, I have reviewed and discussed with patient certain preventive protocols, quality metrics, and best practice recommendations. A written personalized care plan for preventive services as well as general preventive health recommendations were provided to patient.   Jeremy GORMAN Das, LPN   87/83/7974   Return in 1 year (on 05/25/2025).  After Visit Summary:  (MyChart) Due to this being a telephonic visit, the after visit summary with patients personalized plan was offered to patient via MyChart   Nurse Notes: UTD ON SHOTS; AGED OUT OF COLONOSCOPY  No voiced or noted concerns at this time

## 2024-06-01 ENCOUNTER — Encounter: Payer: Self-pay | Admitting: Nurse Practitioner

## 2024-06-01 ENCOUNTER — Ambulatory Visit: Admitting: Nurse Practitioner

## 2024-06-01 VITALS — BP 110/68 | HR 55 | Temp 98.2°F | Resp 18 | Ht 75.0 in | Wt 188.0 lb

## 2024-06-01 DIAGNOSIS — L97929 Non-pressure chronic ulcer of unspecified part of left lower leg with unspecified severity: Secondary | ICD-10-CM | POA: Diagnosis not present

## 2024-06-01 MED ORDER — CIPROFLOXACIN HCL 250 MG PO TABS: 250.0000 mg | ORAL_TABLET | Freq: Two times a day (BID) | ORAL | 0 refills | Status: AC

## 2024-06-01 MED ORDER — METRONIDAZOLE 500 MG PO TABS: 500.0000 mg | ORAL_TABLET | Freq: Two times a day (BID) | ORAL | 0 refills | Status: AC

## 2024-06-01 NOTE — Progress Notes (Unsigned)
° °  BP 110/68   Pulse (!) 55   Temp 98.2 F (36.8 C)   Resp 18   Ht 6' 3 (1.905 m)   Wt 188 lb (85.3 kg)   SpO2 96%   BMI 23.50 kg/m    Subjective:    Patient ID: Jeremy [name], male    DOB: 09-Dec-1935, 88 y.o.   MRN: [id number]  HPI: Jeremy [name] is a 88 y.o. male  Chief Complaint  Patient presents with   Wound Check    Possible cellulitis   Discussed the use of AI scribe software for clinical note transcription with the patient, who gave verbal consent to proceed.  History of Present Illness           05/25/2024    4:06 PM 12/21/2023    1:15 PM 10/19/2023    1:32 PM  Depression screen PHQ 2/9  Decreased Interest 0 0 0  Down, Depressed, Hopeless 0 0 0  PHQ - 2 Score 0 0 0  Altered sleeping 0    Tired, decreased energy 0    Change in appetite 0    Feeling bad or failure about yourself  0    Trouble concentrating 0    Moving slowly or fidgety/restless 0    Suicidal thoughts 0    PHQ-9 Score 0    Difficult doing work/chores Not difficult at all      Relevant past medical, surgical, family and social history reviewed and updated as indicated. Interim medical history since our last visit reviewed. Allergies and medications reviewed and updated.  Review of Systems  Per HPI unless specifically indicated above     Objective:      BP 110/68   Pulse (!) 55   Temp 98.2 F (36.8 C)   Resp 18   Ht 6' 3 (1.905 m)   Wt 188 lb (85.3 kg)   SpO2 96%   BMI 23.50 kg/m   {Vitals History (Optional):23777} Wt Readings from Last 3 Encounters:  06/01/24 188 lb (85.3 kg)  02/23/24 185 lb (83.9 kg)  12/22/23 188 lb 12.8 oz (85.6 kg)    Physical Exam   Results for orders placed or performed in visit on 02/23/24  POC Covid19/Flu A&B Antigen   Collection Time: 02/23/24  1:49 PM  Result Value Ref Range   Influenza A Antigen, POC Negative Negative   Influenza B Antigen, POC Negative Negative   Covid Antigen, POC Positive (A) Negative   {Labs  (Optional):23779}       Assessment & Plan:   Problem List Items Addressed This Visit       Musculoskeletal and Integument   Skin ulcer of left lower leg (HCC) - Primary   Relevant Medications   ciprofloxacin  (CIPRO ) 250 MG tablet   metroNIDAZOLE  (FLAGYL ) 500 MG tablet   Other Relevant Orders   Wound culture     Assessment and Plan Assessment & Plan         Follow up plan: No follow-ups on file.

## 2024-06-05 ENCOUNTER — Ambulatory Visit: Payer: Self-pay | Admitting: Nurse Practitioner

## 2024-06-07 LAB — ANAEROBIC AND AEROBIC CULTURE
MICRO NUMBER:: 17378122
MICRO NUMBER:: 17378123
SPECIMEN QUALITY:: ADEQUATE
SPECIMEN QUALITY:: ADEQUATE

## 2024-06-09 ENCOUNTER — Ambulatory Visit: Admitting: Nurse Practitioner

## 2024-06-09 ENCOUNTER — Encounter: Payer: Self-pay | Admitting: Nurse Practitioner

## 2024-06-09 VITALS — BP 130/84 | HR 88 | Temp 98.0°F | Ht 75.0 in | Wt 188.0 lb

## 2024-06-09 DIAGNOSIS — E11621 Type 2 diabetes mellitus with foot ulcer: Secondary | ICD-10-CM

## 2024-06-09 DIAGNOSIS — L97929 Non-pressure chronic ulcer of unspecified part of left lower leg with unspecified severity: Secondary | ICD-10-CM | POA: Diagnosis not present

## 2024-06-09 DIAGNOSIS — Z794 Long term (current) use of insulin: Secondary | ICD-10-CM | POA: Diagnosis not present

## 2024-06-09 NOTE — Progress Notes (Addendum)
 "  BP 130/84   Pulse 88   Temp 98 F (36.7 C)   Ht 6' 3 (1.905 m)   Wt 188 lb (85.3 kg)   SpO2 95%   BMI 23.50 kg/m    Subjective:    Patient ID: Jeremy [name], male    DOB: [birth year], 88 y.o.   MRN: [id number]  HPI: Jeremy [name] is a 88 y.o. male  Chief Complaint  Patient presents with   Wound Check   Discussed the use of AI scribe software for clinical note transcription with the patient, who gave verbal consent to proceed.  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  Left lower leg skin ulceration - Previously evaluated on December 18, 20, and 25 for a left lower leg skin ulceration - Wound culture positive for MRSA - Completed courses of ciprofloxacin  and metronidazole , finished antibiotics yesterday - Currently using antibiotic ointment and nonstick bandage on the ulcer - Previously used antibiotic ointment and elastic bandages - Ulcer described as improving since last visit, no worsening - Mild pain initially, now resolved; no current pain - Wound care appointment scheduled for February 2nd, on cancellation list for earlier opening  Glycemic control - Diabetes well controlled - Recent blood glucose 79 mg/dL - Hemoglobin J8r within normal range - Currently taking glipizide and insulin          05/25/2024    4:06 PM 12/21/2023    1:15 PM 10/19/2023    1:32 PM  Depression screen PHQ 2/9  Decreased Interest 0 0 0  Down, Depressed, Hopeless 0 0 0  PHQ - 2 Score 0 0 0  Altered sleeping 0    Tired, decreased energy 0    Change in appetite 0    Feeling bad or failure about yourself  0    Trouble concentrating 0    Moving slowly or fidgety/restless 0    Suicidal thoughts 0    PHQ-9 Score 0    Difficult doing work/chores Not difficult at all      Relevant past medical, surgical, family and social history reviewed and updated as indicated. Interim medical  history since our last visit reviewed. Allergies and medications reviewed and updated.  Review of Systems  Ten systems reviewed and is negative except as mentioned in HPI      Objective:      BP 130/84   Pulse 88   Temp 98 F (36.7 C)   Ht 6' 3 (1.905 m)   Wt 188 lb (85.3 kg)   SpO2 95%   BMI 23.50 kg/m    Wt Readings from Last 3 Encounters:  06/09/24 188 lb (85.3 kg)  06/01/24 188 lb (85.3 kg)  02/23/24 185 lb (83.9 kg)    Physical Exam GENERAL: Alert, cooperative, well developed, no acute distress HEENT: Normocephalic, normal oropharynx, moist mucous membranes CHEST: Clear to auscultation bilaterally, no wheezes, rhonchi, or crackles CARDIOVASCULAR: Normal heart rate and rhythm, S1 and S2 normal without murmurs ABDOMEN: Soft, non-tender, non-distended, without organomegaly, normal bowel sounds EXTREMITIES: No cyanosis or edema NEUROLOGICAL: Cranial nerves grossly intact, moves all extremities without gross motor or sensory deficit SKIN: Left lower leg ulcer less erythematous   Results for orders placed or performed in visit on 06/01/24  Anaerobic and Aerobic Culture   Collection Time: 06/01/24  3:13 PM  Result Value Ref Range   MICRO NUMBER:  [id number]    SPECIMEN QUALITY: Adequate    Source: WOUND (SITE NOT SPECIFIED)    STATUS: FINAL    GRAM STAIN:      No white blood cells seen Moderate epithelial cells Many Gram positive cocci in clusters   ANA RESULT: No anaerobes isolated.    MICRO NUMBER: [id number]    SPECIMEN QUALITY: Adequate    SOURCE: WOUND (SITE NOT SPECIFIED)    STATUS: FINAL    AER ISOLATE 1: methicillin resistant Staphylococcus aureus (A)       Susceptibility   Methicillin resistant staphylococcus aureus - AEROBIC BACTERIA, CULT POSITIVE 1    VANCOMYCIN 1 Sensitive     CIPROFLOXACIN  <=0.5 Sensitive     CLINDAMYCIN <=0.25 Sensitive     LEVOFLOXACIN  0.25 Sensitive     ERYTHROMYCIN <=0.25 Sensitive     GENTAMICIN <=0.5 Sensitive     OXACILLIN*  NR Resistant      * Oxacillin-resistant staphylococci are resistant to all currently available beta-lactam antimicrobial agents with the possible exception of ceftaroline.     TETRACYCLINE <=1 Sensitive     TRIMETH/SULFA <=10 Sensitive     MOXIFLOXACIN * <=0.25 Sensitive      * Oxacillin-resistant staphylococci are resistant to all currently available beta-lactam antimicrobial agents with the possible exception of ceftaroline. Legend: S = Susceptible  I = Intermediate R = Resistant  NS = Not susceptible SDD = Susceptible Dose Dependent * = Not Tested  NR = Not Reported **NN = See Therapy Comments           Assessment & Plan:   Problem List Items Addressed This Visit       Musculoskeletal and Integument   Skin ulcer of left lower leg (HCC) - Primary     Assessment and Plan Assessment & Plan Chronic left lower leg ulcer with MRSA infection Chronic ulcer on the left lower leg with MRSA infection. Completed course of Cipro  and Flagyl . Wound culture positive for MRSA. Ulcer appears less red and more dry, indicating improvement. Awaiting wound care appointment, currently on cancellation list. Pain has improved, likely due to antibiotics. - Continue wound care with Antibiotic ointment and elastic bandages. - Perform dressing changes 2-3 times a week until wound care appointment. - Follow up with wound care on February 2nd or earlier if a cancellation occurs.  Type 2 diabetes mellitus Well controlled. Recent blood sugars are 79, and last A1c was within normal range. Currently managed with glipizide and insulin. - Continue current diabetes management with glipizide and insulin.        Follow up plan: Return in about 2 years (around 06/09/2026) for follow up with Dr. Glenard. "

## 2024-06-16 ENCOUNTER — Other Ambulatory Visit: Payer: Self-pay | Admitting: Family Medicine

## 2024-06-16 ENCOUNTER — Ambulatory Visit: Admitting: Nurse Practitioner

## 2024-06-16 DIAGNOSIS — N400 Enlarged prostate without lower urinary tract symptoms: Secondary | ICD-10-CM

## 2024-06-16 NOTE — Telephone Encounter (Signed)
 Requested Prescriptions  Pending Prescriptions Disp Refills   finasteride  (PROSCAR ) 5 MG tablet [Pharmacy Med Name: Finasteride  5 MG Oral Tablet] 90 tablet 0    Sig: Take 1 tablet by mouth once daily     Urology: 5-alpha Reductase Inhibitors Failed - 06/16/2024  2:34 PM      Failed - PSA in normal range and within 360 days    PSA  Date Value Ref Range Status  05/01/2020 0.05 < OR = 4.0 ng/mL Final    Comment:    The total PSA value from this assay system is  standardized against the WHO standard. The test  result will be approximately 20% lower when compared  to the equimolar-standardized total PSA (Beckman  Coulter). Comparison of serial PSA results should be  interpreted with this fact in mind. . This test was performed using the Siemens  chemiluminescent method. Values obtained from  different assay methods cannot be used interchangeably. PSA levels, regardless of value, should not be interpreted as absolute evidence of the presence or absence of disease.          Passed - Valid encounter within last 12 months    Recent Outpatient Visits           1 week ago Skin ulcer of left lower leg, unspecified ulcer stage [facility])   [place] [facility] Gareth Clarity F, FNP   2 weeks ago Skin ulcer of left lower leg, unspecified ulcer stage [facility])   [place] [facility] Gareth Clarity FALCON, FNP   3 months ago Cough, unspecified type   [facility] Leavy Mole, PA-C   5 months ago Hypertension associated with diabetes [facility])   [place] [facility] Glenard Mire, MD   7 months ago Open wound of left lower leg, subsequent encounter   [facility] Gareth Clarity FALCON, FNP       Future Appointments             In 1 week Sowles, Krichna, MD [facility] [place]   In 4 months Hester Alm BROCKS, MD [facility]

## 2024-06-19 ENCOUNTER — Encounter: Payer: Self-pay | Admitting: Nurse Practitioner

## 2024-06-19 ENCOUNTER — Ambulatory Visit (INDEPENDENT_AMBULATORY_CARE_PROVIDER_SITE_OTHER): Admitting: Nurse Practitioner

## 2024-06-19 VITALS — BP 112/80 | HR 68 | Temp 98.0°F | Ht 75.0 in | Wt 188.0 lb

## 2024-06-19 DIAGNOSIS — E1129 Type 2 diabetes mellitus with other diabetic kidney complication: Secondary | ICD-10-CM | POA: Diagnosis not present

## 2024-06-19 DIAGNOSIS — L97929 Non-pressure chronic ulcer of unspecified part of left lower leg with unspecified severity: Secondary | ICD-10-CM

## 2024-06-19 DIAGNOSIS — E11621 Type 2 diabetes mellitus with foot ulcer: Secondary | ICD-10-CM

## 2024-06-19 DIAGNOSIS — Z794 Long term (current) use of insulin: Secondary | ICD-10-CM

## 2024-06-19 DIAGNOSIS — R809 Proteinuria, unspecified: Secondary | ICD-10-CM

## 2024-06-19 NOTE — Progress Notes (Signed)
 "  BP 112/80   Pulse 68   Temp 98 F (36.7 C)   Ht 6' 3 (1.905 m)   Wt 188 lb (85.3 kg)   SpO2 98%   BMI 23.50 kg/m    Subjective:    Patient ID: Jeremy [name], male    DOB: 1935-08-25, 89 y.o.   MRN: [id number]  HPI: Jeremy [name] is a 89 y.o. male  Chief Complaint  Patient presents with   Wound Check   Discussed the use of AI scribe software for clinical note transcription with the patient, who gave verbal consent to proceed.  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  Left lower leg skin ulcer - Skin ulcer present on left lower leg, initially evaluated on June 01, 2024 - Wound culture positive for MRSA - Prescribed ciprofloxacin  and metronidazole  following culture results - Follow-up wound check on June 09, 2024 showed improvement in appearance, continues to improve - Denies pain at the ulcer site - No recent physical therapy - No updates from wound care team - Wound care appointment scheduled for July 17, 2024; on cancellation list for earlier appointment  Glycemic control - Blood glucose measured at 70 mg/dL this morning - Blood sugar has remained stable - Scheduled to see Doctor Soles on Friday          05/25/2024    4:06 PM 12/21/2023    1:15 PM 10/19/2023    1:32 PM  Depression screen PHQ 2/9  Decreased Interest 0 0 0  Down, Depressed, Hopeless 0 0 0  PHQ - 2 Score 0 0 0  Altered sleeping 0    Tired, decreased energy 0    Change in appetite 0    Feeling bad or failure about yourself  0    Trouble concentrating 0    Moving slowly or fidgety/restless 0    Suicidal thoughts 0    PHQ-9 Score 0    Difficult doing work/chores Not difficult at all      Relevant past medical, surgical, family and social history reviewed and updated as indicated. Interim medical history since our last visit reviewed. Allergies and medications reviewed and  updated.  Review of Systems  Ten systems reviewed and is negative except as mentioned in HPI      Objective:      BP 112/80   Pulse 68   Temp 98 F (36.7 C)   Ht 6' 3 (1.905 m)   Wt 188 lb (85.3 kg)   SpO2 98%   BMI 23.50 kg/m    Wt Readings from Last 3 Encounters:  06/19/24 188 lb (85.3 kg)  06/09/24 188 lb (85.3 kg)  06/01/24 188 lb (85.3 kg)    Physical Exam GENERAL: Alert, cooperative, well developed, no acute distress. HEENT: Normocephalic, normal oropharynx, moist mucous membranes. CHEST: Clear to auscultation bilaterally, no wheezes, rhonchi, or crackles. CARDIOVASCULAR: Normal heart rate and rhythm, S1 and S2 normal without murmurs. ABDOMEN: Soft, non-tender, non-distended, without organomegaly, normal bowel sounds. EXTREMITIES: No cyanosis or edema. NEUROLOGICAL: Cranial nerves grossly intact, moves all extremities without gross motor or sensory deficit. SKIN: Skin ulcer on left lower leg with discoloration, improving.  Results for orders placed or performed in visit on 06/01/24  Anaerobic and Aerobic Culture   Collection Time: 06/01/24  3:13 PM  Result Value Ref Range   MICRO NUMBER: [id number]  SPECIMEN QUALITY: Adequate    Source: WOUND (SITE NOT SPECIFIED)    STATUS: FINAL    GRAM STAIN:      No white blood cells seen Moderate epithelial cells Many Gram positive cocci in clusters   ANA RESULT: No anaerobes isolated.    MICRO NUMBER: [id number]    SPECIMEN QUALITY: Adequate    SOURCE: WOUND (SITE NOT SPECIFIED)    STATUS: FINAL    AER ISOLATE 1: methicillin resistant Staphylococcus aureus (A)       Susceptibility   Methicillin resistant staphylococcus aureus - AEROBIC BACTERIA, CULT POSITIVE 1    VANCOMYCIN 1 Sensitive     CIPROFLOXACIN  <=0.5 Sensitive     CLINDAMYCIN <=0.25 Sensitive     LEVOFLOXACIN  0.25 Sensitive     ERYTHROMYCIN <=0.25 Sensitive     GENTAMICIN <=0.5 Sensitive     OXACILLIN* NR Resistant      * Oxacillin-resistant  staphylococci are resistant to all currently available beta-lactam antimicrobial agents with the possible exception of ceftaroline.     TETRACYCLINE <=1 Sensitive     TRIMETH/SULFA <=10 Sensitive     MOXIFLOXACIN * <=0.25 Sensitive      * Oxacillin-resistant staphylococci are resistant to all currently available beta-lactam antimicrobial agents with the possible exception of ceftaroline. Legend: S = Susceptible  I = Intermediate R = Resistant  NS = Not susceptible SDD = Susceptible Dose Dependent * = Not Tested  NR = Not Reported **NN = See Therapy Comments           Assessment & Plan:   Problem List Items Addressed This Visit   None    Assessment and Plan Assessment & Plan Chronic skin ulcer of left lower leg with MRSA infection Chronic skin ulcer on the left lower leg with MRSA infection. The wound is improving with current treatment.  No pain reported. Previous wound culture positive for MRSA, treated with Cipro . Wound care appointment scheduled for February 2nd, but he is on a cancellation list for an earlier appointment. - Continue dressing changes 2-3 times a week until wound care appointment. - Apply new dressing with antibiotic ointment. - Use Vaseline at home to keep wound clean and moist.  Type 2 diabetes mellitus Blood sugar levels are well-controlled, with a recent reading of 70 mg/dL.        Follow up plan: Return for has appointment with Dr. Glenard on Friday. "

## 2024-06-22 ENCOUNTER — Telehealth: Payer: Self-pay

## 2024-06-22 NOTE — Telephone Encounter (Signed)
Form in green folder.

## 2024-06-22 NOTE — Telephone Encounter (Signed)
 No form has been received yet but he is on the schedule for tomorrow FYI

## 2024-06-22 NOTE — Telephone Encounter (Signed)
 Copied from CRM [id number]. Topic: General - Other >> Jun 22, 2024  9:47 AM Zebedee SAUNDERS wrote: Reason for CRM: Received call from [place]  [facility] per [facility] ph:[phone] fax :[phone] requesting surgery clearance from Dr. Glenard. Pt is scheduled for surgery on 06/28/2024. Fax requested was sent on 06/22/2024, please complete and refax.

## 2024-06-23 ENCOUNTER — Ambulatory Visit: Admitting: Family Medicine

## 2024-06-23 ENCOUNTER — Encounter: Payer: Self-pay | Admitting: Family Medicine

## 2024-06-23 VITALS — BP 118/72 | HR 90 | Resp 16 | Ht 75.0 in | Wt 188.0 lb

## 2024-06-23 DIAGNOSIS — G8929 Other chronic pain: Secondary | ICD-10-CM | POA: Diagnosis not present

## 2024-06-23 DIAGNOSIS — Z794 Long term (current) use of insulin: Secondary | ICD-10-CM

## 2024-06-23 DIAGNOSIS — Z7901 Long term (current) use of anticoagulants: Secondary | ICD-10-CM | POA: Diagnosis not present

## 2024-06-23 DIAGNOSIS — S81802D Unspecified open wound, left lower leg, subsequent encounter: Secondary | ICD-10-CM

## 2024-06-23 DIAGNOSIS — Z01818 Encounter for other preprocedural examination: Secondary | ICD-10-CM | POA: Diagnosis not present

## 2024-06-23 DIAGNOSIS — M25511 Pain in right shoulder: Secondary | ICD-10-CM | POA: Diagnosis not present

## 2024-06-23 DIAGNOSIS — E1129 Type 2 diabetes mellitus with other diabetic kidney complication: Secondary | ICD-10-CM

## 2024-06-23 DIAGNOSIS — S81801D Unspecified open wound, right lower leg, subsequent encounter: Secondary | ICD-10-CM

## 2024-06-23 DIAGNOSIS — I482 Chronic atrial fibrillation, unspecified: Secondary | ICD-10-CM

## 2024-06-23 DIAGNOSIS — R809 Proteinuria, unspecified: Secondary | ICD-10-CM

## 2024-06-23 NOTE — Progress Notes (Addendum)
 Name: Jeremy [name]   MRN: [id number]    DOB: 09-16-1935   Date:06/23/2024       Progress Note  Subjective  Chief Complaint  Chief Complaint  Patient presents with   Medical Management of Chronic Issues   Discussed the use of AI scribe software for clinical note transcription with the patient, who gave verbal consent to proceed.  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  He is scheduled for a right shoulder replacement surgery on January 14th. He reports significant limitation in range of motion and has been told he has bone-on-bone contact in the shoulder. He is unable to raise his arm more than twenty degrees and experiences pain when moving the shoulder, although it is not severe at rest. He has previously undergone physical therapy, which improved his range of motion but did not alleviate the underlying issue. He reports that his surgeon told him an MRI showed he no longer has a rotator cuff and has arthritis in the shoulder. He is currently taking tramadol  for pain but has not needed it frequently.  He has a history of a chronic leg wound that has been problematic in the past, particularly during previous surgeries. He has been in contact with wound care specialists, but they are unable to see him until February 2nd.  He has a history of atrial fibrillation for which he underwent an ablation. He is currently in normal sinus rhythm and takes cardizem. He continues to take Eliquis for anticoagulation. He reports a history of bradycardia when on a beta blocker, which was resolved by discontinuing the medication.  He has a history of diabetes, managed with insulin, glipizide, and Jardiance. His last A1c was 5.5. Fasting blood sugars typically range between 70 and 100. He plans to stop glipizide and Jardiance prior to surgery.  He has a history of esophageal varices, but recent  evaluations have not confirmed this diagnosis. He also has a history of a TIA, which he attributes to high blood sugar levels prior to starting insulin therapy.  He uses a cane for balance and reports no history of falls. He has experienced nausea with anesthesia in the past and has a dental implant that was loosened during a previous surgery. No chest pain, shortness of breath, or recent falls.    Patient Active Problem List   Diagnosis Date Noted   Chronic superficial gastritis without bleeding 12/21/2023   History of total right knee replacement 08/30/2023   S/P ablation of atrial fibrillation 08/04/2023   Anticoagulated on Eliquis 06/03/2023   Family history of Lynch syndrome 06/03/2023   Insomnia 03/11/2023   Anemia 03/11/2023   Hypertension, benign 03/11/2023   Type 2 diabetes mellitus with microalbuminuria, with long-term current use of insulin (HCC) 03/11/2023   Persistent atrial fibrillation (HCC) 02/16/2023   Thrombocytopenia 06/03/2022   B12 deficiency 06/03/2022   Basal cell carcinoma (BCC) of skin of face 11/03/2021   Dyslipidemia associated with type 2 diabetes mellitus (HCC) 11/03/2021   Skin ulcer of left lower leg (HCC) 05/01/2020   Impingement syndrome of shoulder region 11/16/2018   Atherosclerosis of aorta 07/21/2018   History of diverticulitis of colon 11/15/2017   Esophageal varices determined by endoscopy (HCC) 11/15/2017   Hyperlipidemia due to type 2 diabetes mellitus (HCC) 03/14/2017   Bilateral lower extremity edema 03/27/2016   Vitamin D  deficiency 12/05/2015   Hx of transient ischemic attack (TIA)  09/17/2015   Diabetes mellitus type 2 without retinopathy (HCC) 05/31/2015   Hyperlipidemia 05/30/2015   Type 2 diabetes mellitus with renal manifestations (HCC) 01/08/2015   Incisional hernia, without obstruction or gangrene 03/23/2013   Benign prostatic hyperplasia without lower urinary tract symptoms 11/03/2012   Myopic degeneration, bilateral 08/12/2012    Nonexudative age-related macular degeneration 07/15/2012   Anisometropia 04/08/2012   Pseudophakia 04/08/2012   History of retinal detachment 04/08/2012   Presence of intraocular lens 04/08/2012   Secondary open-angle glaucoma of left eye, severe stage 01/13/2012   Benign essential HTN 02/03/2007   Hypertension associated with diabetes (HCC) 02/03/2007    Past Surgical History:  Procedure Laterality Date   APPENDECTOMY     BLADDER DIVERTICULECTOMY  2013   CARDIOVERSION N/A 02/24/2023   Procedure: CARDIOVERSION;  Surgeon: Ammon Blunt, MD;  Location: [facility] ORS;  Service: Cardiovascular;  Laterality: N/A;   CARDIOVERSION N/A 04/21/2023   Procedure: CARDIOVERSION;  Surgeon: Ammon Blunt, MD;  Location: [facility];  Service: Cardiovascular;  Laterality: N/A;   CARPAL TUNNEL RELEASE  01/2022   revision right side   COLON SURGERY Left 1980's   COLONOSCOPY N/A 12/22/2023   Procedure: COLONOSCOPY;  Surgeon: Toledo, Ladell POUR, MD;  Location: [facility] ENDOSCOPY;  Service: Gastroenterology;  Laterality: N/A;  DM   COLONOSCOPY W/ POLYPECTOMY  1990's   Dr Rollene   COLONOSCOPY WITH PROPOFOL  N/A 12/21/2014   Procedure: COLONOSCOPY WITH PROPOFOL ;  Surgeon: Gladis RAYMOND Mariner, MD;  Location: [facility] ENDOSCOPY;  Service: Endoscopy;  Laterality: N/A;   COLONOSCOPY WITH PROPOFOL  N/A 08/16/2017   Procedure: COLONOSCOPY WITH PROPOFOL ;  Surgeon: Mariner Gladis RAYMOND, MD;  Location: [facility] ENDOSCOPY;  Service: Endoscopy;  Laterality: N/A;   COLONOSCOPY WITH PROPOFOL  N/A 01/31/2021   Procedure: COLONOSCOPY WITH PROPOFOL ;  Surgeon: Dessa Reyes ORN, MD;  Location: [facility] ENDOSCOPY;  Service: Endoscopy;  Laterality: N/A;   ESOPHAGOGASTRODUODENOSCOPY N/A 12/22/2023   Procedure: EGD (ESOPHAGOGASTRODUODENOSCOPY);  Surgeon: Toledo, Ladell POUR, MD;  Location: [facility] ENDOSCOPY;  Service: Gastroenterology;  Laterality: N/A;   ESOPHAGOGASTRODUODENOSCOPY (EGD) WITH PROPOFOL  N/A 02/15/2015   Procedure:  ESOPHAGOGASTRODUODENOSCOPY (EGD) WITH PROPOFOL ;  Surgeon: Gladis RAYMOND Mariner, MD;  Location: [facility] ENDOSCOPY;  Service: Endoscopy;  Laterality: N/A;   ESOPHAGOGASTRODUODENOSCOPY (EGD) WITH PROPOFOL  N/A 06/10/2017   Procedure: ESOPHAGOGASTRODUODENOSCOPY (EGD) WITH PROPOFOL ;  Surgeon: Mariner Gladis RAYMOND, MD;  Location: [facility] ENDOSCOPY;  Service: Endoscopy;  Laterality: N/A;   ESOPHAGOGASTRODUODENOSCOPY (EGD) WITH PROPOFOL  N/A 08/16/2017   Procedure: ESOPHAGOGASTRODUODENOSCOPY (EGD) WITH PROPOFOL ;  Surgeon: Mariner Gladis RAYMOND, MD;  Location: [facility] ENDOSCOPY;  Service: Endoscopy;  Laterality: N/A;   ESOPHAGOGASTRODUODENOSCOPY (EGD) WITH PROPOFOL  N/A 01/31/2021   Procedure: ESOPHAGOGASTRODUODENOSCOPY (EGD) WITH PROPOFOL ;  Surgeon: Dessa Reyes ORN, MD;  Location: [facility] ENDOSCOPY;  Service: Endoscopy;  Laterality: N/A;   EYE SURGERY     GLAUCOMA SURGERY Left 1990's   [facility]   HEMORROIDECTOMY  1980's   [facility]   HERNIA REPAIR     [facility], Dr Rollene FONTANA HERNIA REPAIR Bilateral [phone]   LARYNX SURGERY  1990   3 times   MOHS SURGERY  01/22/2022   Dr. Timm - [facility]   REPLACEMENT TOTAL KNEE Right    RETINAL DETACHMENT SURGERY Right    TONSILLECTOMY     turp  2013   URETER SURGERY  2013   Dr Ike    Family History  Problem Relation Age of Onset   Diabetes Mother    Cataracts Mother    Metabolic syndrome Mother    Blindness Mother  r/t diabetes   Cataracts Father    Glaucoma Father        Retina-Detachment   Atrial fibrillation Father    Diverticulitis Sister    Glaucoma Brother    Diabetes Brother    Cancer Sister        Gallbladder   Cancer Daughter     Social History   Tobacco Use   Smoking status: Never    Passive exposure: Never   Smokeless tobacco: Never   Tobacco comments:    smoking cessation materials not required  Substance Use Topics   Alcohol use: No    Current Medications[1]  Allergies[2]  I personally reviewed active problem  list, medication list, allergies, family history with the patient/caregiver today.   ROS  Ten systems reviewed and is negative except as mentioned in HPI   Objective Physical Exam CONSTITUTIONAL: Patient appears well-developed and well-nourished. No distress. HEENT: Head atraumatic, normocephalic, neck supple. CARDIOVASCULAR: Normal rate, regular rhythm and normal heart sounds. No murmur heard. No BLE edema. Leg wound healing with thin skin, no signs of infection. PULMONARY: Effort normal and breath sounds normal. No respiratory distress. ABDOMINAL: There is no tenderness or distention. MUSCULOSKELETAL: slow, uses a cane PSYCHIATRIC: Patient has a normal mood and affect. Behavior is normal. Judgment and thought content normal.     Vitals:   06/23/24 1313  BP: 118/72  Pulse: 90  Resp: 16  SpO2: 95%  Weight: 188 lb (85.3 kg)  Height: 6' 3 (1.905 m)    Body mass index is 23.5 kg/m.  Recent Results (from the past 2160 hours)  Anaerobic and Aerobic Culture     Status: Abnormal   Collection Time: 06/01/24  3:13 PM  Result Value Ref Range   MICRO NUMBER: [id number]    SPECIMEN QUALITY: Adequate    Source: WOUND (SITE NOT SPECIFIED)    STATUS: FINAL    GRAM STAIN:      No white blood cells seen Moderate epithelial cells Many Gram positive cocci in clusters   ANA RESULT: No anaerobes isolated.    MICRO NUMBER: [id number]    SPECIMEN QUALITY: Adequate    SOURCE: WOUND (SITE NOT SPECIFIED)    STATUS: FINAL    AER ISOLATE 1: methicillin resistant Staphylococcus aureus (A)     Comment: Heavy growth of Methicillin resistant Staphylococcus aureus (MRSA)      Susceptibility   Methicillin resistant staphylococcus aureus - AEROBIC BACTERIA, CULT POSITIVE 1    VANCOMYCIN 1 Sensitive     CIPROFLOXACIN  <=0.5 Sensitive     CLINDAMYCIN <=0.25 Sensitive     LEVOFLOXACIN  0.25 Sensitive     ERYTHROMYCIN <=0.25 Sensitive     GENTAMICIN <=0.5 Sensitive     OXACILLIN* NR Resistant      *  Oxacillin-resistant staphylococci are resistant to all currently available beta-lactam antimicrobial agents with the possible exception of ceftaroline.     TETRACYCLINE <=1 Sensitive     TRIMETH/SULFA <=10 Sensitive     MOXIFLOXACIN * <=0.25 Sensitive      * Oxacillin-resistant staphylococci are resistant to all currently available beta-lactam antimicrobial agents with the possible exception of ceftaroline. Legend: S = Susceptible  I = Intermediate R = Resistant  NS = Not susceptible SDD = Susceptible Dose Dependent * = Not Tested  NR = Not Reported **NN = See Therapy Comments     Diabetic Foot Exam:     PHQ2/9:    06/23/2024    1:13 PM 05/25/2024    4:06 PM 12/21/2023  1:15 PM 10/19/2023    1:32 PM 06/21/2023    2:05 PM  Depression screen PHQ 2/9  Decreased Interest 0 0 0 0 0  Down, Depressed, Hopeless 0 0 0 0 0  PHQ - 2 Score 0 0 0 0 0  Altered sleeping 0 0   0  Tired, decreased energy 0 0   0  Change in appetite 0 0   0  Feeling bad or failure about yourself  0 0   0  Trouble concentrating 0 0   0  Moving slowly or fidgety/restless 0 0   0  Suicidal thoughts 0 0   0  PHQ-9 Score 0 0   0   Difficult doing work/chores Not difficult at all Not difficult at all   Not difficult at all     Data saved with a previous flowsheet row definition    phq 9 is negative  Fall Risk:    06/23/2024    1:13 PM 05/25/2024    3:52 PM 12/21/2023    1:15 PM 10/19/2023    1:24 PM 06/21/2023    1:57 PM  Fall Risk   Falls in the past year? 0 0 0 0 0  Number falls in past yr: 0 0 0 0 0  Injury with Fall? 0 0 0  0  0   Risk for fall due to : No Fall Risks No Fall Risks No Fall Risks No Fall Risks No Fall Risks  Follow up Falls evaluation completed Falls evaluation completed;Falls prevention discussed Falls evaluation completed Falls prevention discussed;Education provided;Falls evaluation completed Falls prevention discussed;Education provided;Falls evaluation completed     Data saved with a  previous flowsheet row definition      Assessment & Plan Preoperative evaluation for right shoulder arthroplasty Scheduled for right shoulder arthroplasty on January 14th, 2026. Chronic osteoarthritis with bone-on-bone contact and loss of rotator cuff. No recent surgeries with complications. No current chest pain or shortness of breath. No smoking history. Uses a cane for balance. - Proceed with right shoulder arthroplasty on January 14th, 2026. - Monitor for nausea during anesthesia. - Communicate with anesthesiologist regarding dental implant.  Primary osteoarthritis of right shoulder Chronic osteoarthritis with bone-on-bone contact and loss of rotator cuff. Limited range of motion and pain on movement. Surgery planned to improve range of motion by altering joint position. - Proceed with right shoulder arthroplasty as planned.  Chronic wound of left  lower leg Chronic wound on left lower leg, previously oozing but now almost completely healed with thin skin and scarring. No current signs of infection. Wound appears stable.  Type 2 diabetes mellitus with diabetic kidney complication Type 2 diabetes with diabetic kidney complication. A1c previously 5.5, below target of 7.5. On glipizide and insulin. Risk of hypoglycemia, especially with age. Preoperative adjustments include holding metformin  and Jardiance. - Hold metformin  and Jardiance before surgery. - Monitor blood glucose levels closely. - Discuss glipizide dosage with endocrinologist do to risk of hypoglycemia.  Chronic atrial fibrillation, status post ablation, on anticoagulation Chronic atrial fibrillation, status post successful ablation. Currently in normal sinus rhythm. On Eliquis for anticoagulation. No recent episodes of atrial fibrillation or flutter. - Hold Eliquis three days before surgery. - Continue monitoring heart rhythm post-ablation.        [1]  Current Outpatient Medications:    acetaminophen  (TYLENOL ) 500 MG  tablet, Take 1,000 mg by mouth 2 (two) times daily as needed for moderate pain (pain score 4-6)., Disp: , Rfl:  apixaban (ELIQUIS) 5 MG TABS tablet, Take 5 mg by mouth 2 (two) times daily., Disp: , Rfl:    brimonidine-timolol (COMBIGAN) 0.2-0.5 % ophthalmic solution, Place 1 drop into the left eye 2 (two) times daily., Disp: , Rfl:    diclofenac  Sodium (VOLTAREN ) 1 % GEL, Apply 4 g topically 4 (four) times daily., Disp: 300 g, Rfl: 1   diltiazem (CARDIZEM CD) 120 MG 24 hr capsule, Take 120 mg by mouth daily., Disp: , Rfl:    dorzolamide (TRUSOPT) 2 % ophthalmic solution, Place 1 drop into the left eye 2 (two) times daily., Disp: , Rfl: 3   empagliflozin (JARDIANCE) 25 MG TABS tablet, Take 25 mg by mouth daily., Disp: , Rfl:    finasteride  (PROSCAR ) 5 MG tablet, Take 1 tablet by mouth once daily, Disp: 90 tablet, Rfl: 0   glipiZIDE (GLUCOTROL) 10 MG tablet, Take 10 mg by mouth daily., Disp: , Rfl:    hydrocortisone  2.5 % cream, APPLY TO THE AFFECTED AREA OF FACE AND EARS EVERY OTHER NIGHT ALTERNATING WITH KETOCONAZOLE  CREAM, Disp: 28 g, Rfl: 11   insulin degludec (TRESIBA FLEXTOUCH) 200 UNIT/ML FlexTouch Pen, Inject 22 Units into the skin in the morning. (Patient taking differently: Inject 18 Units into the skin in the morning.), Disp: , Rfl:    ketoconazole  (NIZORAL ) 2 % cream, Apply two grams to the face every other day alternating with hydrocortisone  2.5% cream every other day., Disp: 60 g, Rfl: 11   ketoconazole  (NIZORAL ) 2 % shampoo, Apply 1 Application topically once a week. [place], Disp: 120 mL, Rfl: 11   loperamide (IMODIUM A-D) 2 MG tablet, Take 2 mg by mouth as needed for diarrhea or loose stools., Disp: , Rfl:    loratadine (CLARITIN) 10 MG tablet, Take 10 mg by mouth daily., Disp: , Rfl:    metFORMIN  (GLUCOPHAGE ) 1000 MG tablet, Take 1,000 mg by mouth 2 (two) times daily with a meal. On Monday and Wednesday (Patient taking differently: Take 500 mg by mouth 2 (two) times daily with a meal.  TAKES ONCE PER DAY), Disp: , Rfl:    mometasone  (ELOCON ) 0.1 % lotion, APPLY TOPICALLY TO ITCHY AREAS OF SCALP ONCE TO TWICE DAILY AS NEEDED, Disp: 60 mL, Rfl: 6   MOUNJARO 2.5 MG/0.5ML Pen, SMARTSIG:0.5 Milliliter(s) SUB-Q Once a Week, Disp: , Rfl:    Multiple Vitamin (MULTIVITAMIN) tablet, Take 1 tablet by mouth daily., Disp: , Rfl:    omega-3 acid ethyl esters (LOVAZA ) 1 g capsule, Take 2 capsules (2 g total) by mouth 2 (two) times daily., Disp: 480 capsule, Rfl: 3   omeprazole  (PRILOSEC) 20 MG capsule, Take 1 capsule (20 mg total) by mouth daily., Disp: 90 capsule, Rfl: 1   rosuvastatin  (CRESTOR ) 10 MG tablet, Take 1 tablet (10 mg total) by mouth at bedtime., Disp: 90 tablet, Rfl: 1   tadalafil  (CIALIS ) 20 MG tablet, Take 1 tablet (20 mg total) by mouth daily as needed for erectile dysfunction., Disp: 90 tablet, Rfl: 0   telmisartan -hydrochlorothiazide  (MICARDIS  HCT) 80-12.5 MG tablet, Take 1 tablet by mouth once daily, Disp: 90 tablet, Rfl: 0 [2]  Allergies Allergen Reactions   Demerol [Meperidine] Other (See Comments) and Anaphylaxis    MAKES BLOOD PRESSURE BOTTOM OUT hypotension   Metoprolol Shortness Of Breath   Dilaudid [Hydromorphone Hcl]     MAKES BLOOD PRESSURE BOTTOM OUT   Actos [Pioglitazone] Other (See Comments) and Hives   Dapagliflozin Rash   Penicillins Rash

## 2024-06-28 NOTE — Telephone Encounter (Signed)
 Tried calling Lori back no success ext # incorrect. Forms were faxed the same day pt had an appt. Forms are not scanned into chart yet so unable to refax it.

## 2024-06-28 NOTE — Telephone Encounter (Unsigned)
 Copied from CRM [id number]. Topic: General - Other >> Jun 22, 2024  9:47 AM Zebedee SAUNDERS wrote: Reason for CRM: Received call from [place]  [facility] per [facility] ph:[phone] ext.[phone] fax [phone] requesting surgery clearance from Dr. Glenard. Pt is scheduled for surgery on 06/28/2024. Fax requested was sent on 06/22/2024, please complete and refax. >> Jun 27, 2024  1:46 PM Emylou G wrote: Patient called.. checking status of release.. he said they had to move surgery to the 28th because we are waiting on the clearance.. Did we get the fax?  They need the okay to do the surgery?  Or was a note sent?  He did have an appt Friday w/Dr Glenard - Pls call patient if you need to.. But they are waiting on us .

## 2024-07-17 ENCOUNTER — Encounter: Admitting: Physician Assistant

## 2024-07-20 ENCOUNTER — Non-Acute Institutional Stay (SKILLED_NURSING_FACILITY): Payer: Self-pay | Admitting: Internal Medicine

## 2024-07-20 ENCOUNTER — Encounter: Payer: Self-pay | Admitting: Internal Medicine

## 2024-07-20 DIAGNOSIS — Z794 Long term (current) use of insulin: Secondary | ICD-10-CM

## 2024-07-20 DIAGNOSIS — N400 Enlarged prostate without lower urinary tract symptoms: Secondary | ICD-10-CM

## 2024-07-20 DIAGNOSIS — E1129 Type 2 diabetes mellitus with other diabetic kidney complication: Secondary | ICD-10-CM

## 2024-07-20 DIAGNOSIS — Z7901 Long term (current) use of anticoagulants: Secondary | ICD-10-CM

## 2024-07-20 DIAGNOSIS — E782 Mixed hyperlipidemia: Secondary | ICD-10-CM

## 2024-07-20 DIAGNOSIS — E785 Hyperlipidemia, unspecified: Secondary | ICD-10-CM

## 2024-07-20 DIAGNOSIS — Z96611 Presence of right artificial shoulder joint: Secondary | ICD-10-CM | POA: Insufficient documentation

## 2024-07-20 DIAGNOSIS — I482 Chronic atrial fibrillation, unspecified: Secondary | ICD-10-CM

## 2024-07-20 DIAGNOSIS — E1169 Type 2 diabetes mellitus with other specified complication: Secondary | ICD-10-CM

## 2024-07-20 DIAGNOSIS — I10 Essential (primary) hypertension: Secondary | ICD-10-CM

## 2024-07-20 NOTE — Assessment & Plan Note (Signed)
 On Eliquis twice daily for chronic A-fib

## 2024-07-20 NOTE — Assessment & Plan Note (Signed)
 Continue with Januvia 25 mg daily, glipizide 10 mg daily, Tresiba 60 units daily, metformin  1000 mg daily

## 2024-07-20 NOTE — Assessment & Plan Note (Signed)
 Continue with occupational and physical therapy.  Pain is well-controlled.  He is doxycycline  for surgical prophylaxis for 14 days per orthopedics.

## 2024-07-20 NOTE — Assessment & Plan Note (Signed)
 Continue with Cardizem CD 120 mg daily, telmisartan /hydrochlorothiazide  80/12.51 p.o. daily

## 2024-07-20 NOTE — Progress Notes (Signed)
 [facility] Admission H&P  Provider: Laurence Locus, DO Location:  Other [facility]) Nursing Home Room Number: [place] [address] Place of Service:  SNF ([id number])   PCP: Sowles, Krichna, MD Patient Care Team: Sowles, Krichna, MD as PCP - General (Family Medicine) Requested, Self Jaye Fallow, MD as Consulting Physician (Ophthalmology) Cleotilde Barrio, MD as Consulting Physician (Orthopedic Surgery) Ammon Blunt, MD as Consulting Physician (Cardiology) Cherilyn Debby CROME, MD as Consulting Physician (Internal Medicine) Neill Boas, DPM (Podiatry) Hester Alm BROCKS, MD (Dermatology) Jacobo Evalene PARAS, MD as Consulting Physician (Oncology)   Extended Emergency Contact Information Primary Emergency Contact: Rannie Maryelizabeth [place], [place] Mobile Phone: [phone] Relation: Daughter Secondary Emergency Contact: Chad Coletta Mages, [place] Home Phone: [phone] Mobile Phone: [phone] Relation: Son   Goals of Care: Advanced Directive information    05/25/2024    3:52 PM  Advanced Directives  Does Patient Have a Medical Advance Directive? Yes  Type of Estate Agent of [place];Living will  Does patient want to make changes to medical advance directive? No - Patient declined  Copy of Healthcare Power of Attorney in Chart? Yes - validated most recent copy scanned in chart (See row information)    CODE STATUS: Full Code    Chief Complaint  Patient presents with   New Admit To SNF     HPI: Patient is a 89 y.o. male seen today for admission to [facility].  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  He had his right knee replacement done about a year ago.  He  was admitted to [facility] for skilled rehab.  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  He stopped reading histology films several years ago.  His wife is currently a long-term resident at [facility].  Patient states that his therapy is going well.  He is not doing any shoulder movements yet.  He is only working with his hands.  He has a follow-up appointment with orthopedics next week.  He hopes that he will be able to go home soon.  He has no specific medical complaints.  He has a chronic wound on his left pretibial area.  He states that his skin is very thin on his shin area.  He scrapes this and cuts this easily.  He states that his orthopedic surgeon had to clear him for surgery by evaluating his left leg prior to surgery.  He does still have various scabs and healing abrasions to his left leg.  This is a comprehensive admission note to this SNF performed on this date less than 30 days from date of admission. Included are preadmission medical/surgical history; reconciled medication list; family history; social history and comprehensive review of systems.  Corrections and additions to the records were documented. Comprehensive physical exam was also performed. Additionally a clinical summary was entered for each active diagnosis pertinent to this admission in the Problem List to enhance continuity of care.   Past Medical History:  Diagnosis Date   Allergy    Basal cell carcinoma 10/09/2021   right medial forehead at glabella, Mohs 01/22/22   Chronic kidney disease  Diabetes mellitus without complication (HCC)    Diabetic neuropathy (HCC)    Diverticula, colon 1980's   Diverticulosis    Enlarged prostate    Family history of Lynch syndrome 06/03/2023   GERD (gastroesophageal reflux disease)    Glaucoma    Hemorrhoids    High cholesterol    History of diverticulitis of colon 11/15/2017   History of hiatal hernia    History of retinal  detachment 04/08/2012   History of total right knee replacement 08/30/2023   Hx of transient ischemic attack (TIA) 09/17/2015   Hyperlipidemia    Hypertension    Incisional hernia, without obstruction or gangrene 03/23/2013   Obesity    Onychomycosis    S/P ablation of atrial fibrillation 08/04/2023   Seizures (HCC)    Small bowel obstruction (HCC) 1990's   Urination disorder    Past Surgical History:  Procedure Laterality Date   APPENDECTOMY     BLADDER DIVERTICULECTOMY  2013   CARDIOVERSION N/A 02/24/2023   Procedure: CARDIOVERSION;  Surgeon: Ammon Blunt, MD;  Location: [facility] ORS;  Service: Cardiovascular;  Laterality: N/A;   CARDIOVERSION N/A 04/21/2023   Procedure: CARDIOVERSION;  Surgeon: Ammon Blunt, MD;  Location: [facility] ORS;  Service: Cardiovascular;  Laterality: N/A;   CARPAL TUNNEL RELEASE  01/2022   revision right side   COLON SURGERY Left 1980's   COLONOSCOPY N/A 12/22/2023   Procedure: COLONOSCOPY;  Surgeon: Toledo, Ladell POUR, MD;  Location: [facility] ENDOSCOPY;  Service: Gastroenterology;  Laterality: N/A;  DM   COLONOSCOPY W/ POLYPECTOMY  1990's   Dr Rollene   COLONOSCOPY WITH PROPOFOL  N/A 12/21/2014   Procedure: COLONOSCOPY WITH PROPOFOL ;  Surgeon: Gladis RAYMOND Mariner, MD;  Location: [facility] ENDOSCOPY;  Service: Endoscopy;  Laterality: N/A;   COLONOSCOPY WITH PROPOFOL  N/A 08/16/2017   Procedure: COLONOSCOPY WITH PROPOFOL ;  Surgeon: Mariner Gladis RAYMOND, MD;  Location: [facility] ENDOSCOPY;  Service: Endoscopy;  Laterality: N/A;   COLONOSCOPY WITH PROPOFOL  N/A 01/31/2021   Procedure: COLONOSCOPY WITH PROPOFOL ;  Surgeon: Dessa Reyes ORN, MD;  Location: [facility] ENDOSCOPY;  Service: Endoscopy;  Laterality: N/A;   ESOPHAGOGASTRODUODENOSCOPY N/A 12/22/2023   Procedure: EGD (ESOPHAGOGASTRODUODENOSCOPY);  Surgeon: Toledo, Ladell POUR, MD;  Location: [facility] ENDOSCOPY;  Service: Gastroenterology;  Laterality: N/A;   ESOPHAGOGASTRODUODENOSCOPY (EGD) WITH PROPOFOL  N/A 02/15/2015    Procedure: ESOPHAGOGASTRODUODENOSCOPY (EGD) WITH PROPOFOL ;  Surgeon: Gladis RAYMOND Mariner, MD;  Location: [facility] ENDOSCOPY;  Service: Endoscopy;  Laterality: N/A;   ESOPHAGOGASTRODUODENOSCOPY (EGD) WITH PROPOFOL  N/A 06/10/2017   Procedure: ESOPHAGOGASTRODUODENOSCOPY (EGD) WITH PROPOFOL ;  Surgeon: Mariner Gladis RAYMOND, MD;  Location: [facility] ENDOSCOPY;  Service: Endoscopy;  Laterality: N/A;   ESOPHAGOGASTRODUODENOSCOPY (EGD) WITH PROPOFOL  N/A 08/16/2017   Procedure: ESOPHAGOGASTRODUODENOSCOPY (EGD) WITH PROPOFOL ;  Surgeon: Mariner Gladis RAYMOND, MD;  Location: [facility] ENDOSCOPY;  Service: Endoscopy;  Laterality: N/A;   ESOPHAGOGASTRODUODENOSCOPY (EGD) WITH PROPOFOL  N/A 01/31/2021   Procedure: ESOPHAGOGASTRODUODENOSCOPY (EGD) WITH PROPOFOL ;  Surgeon: Dessa Reyes ORN, MD;  Location: [facility] ENDOSCOPY;  Service: Endoscopy;  Laterality: N/A;   EYE SURGERY     GLAUCOMA SURGERY Left 1990's   [facility]   HEMORROIDECTOMY  1980's   [facility]   HERNIA REPAIR     [facility], Dr Rollene FONTANA HERNIA REPAIR Bilateral [phone]   LARYNX SURGERY  1990   3 times   MOHS SURGERY  01/22/2022   Dr. Timm - [facility]   REPLACEMENT TOTAL KNEE Right    RETINAL DETACHMENT SURGERY Right    TONSILLECTOMY     turp  2013   URETER SURGERY  2013   Dr Ike    reports that he has never smoked. He has never been exposed to tobacco smoke. He has never used smokeless tobacco. He reports that he does not drink alcohol and does not use drugs. 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)   Tobacco Use   Smoking status: Never    Passive exposure: Never   Smokeless tobacco: Never   Tobacco comments:    smoking cessation materials not  required  Vaping Use   Vaping status: Never Used  Substance and Sexual Activity   Alcohol use: No   Drug use: No   Sexual activity: Yes  Other Topics Concern   Not on file  Social History Narrative   Married , still works as a development worker, community garment/textile technologist)   Three children ( one died in his 59's with colon cancer) , one lives in [place]  and the other one in [place]   His siblings are in [place] and [place]   Social Drivers of Health   Tobacco Use: Low Risk (07/20/2024)   Patient History    Smoking Tobacco Use: Never    Smokeless Tobacco Use: Never    Passive Exposure: Never  Financial Resource Strain: Low Risk (05/06/2023)   Overall Financial Resource Strain (CARDIA)    Difficulty of Paying Living Expenses: Not hard at all  Food Insecurity: No Food Insecurity (05/06/2023)   Hunger Vital Sign    Worried About Running Out of Food in the Last Year: Never true    Ran Out of Food in the Last Year: Never true  Transportation Needs: No Transportation Needs (05/06/2023)   PRAPARE - Administrator, Civil Service (Medical): No    Lack of Transportation (Non-Medical): No  Physical Activity: Insufficiently Active (05/25/2024)   Exercise Vital Sign    Days of Exercise per Week: 2 days    Minutes of Exercise per Session: 20 min  Stress: No Stress Concern Present (05/25/2024)   [employer] of Occupational Health - Occupational Stress Questionnaire    Feeling of Stress: Not at all  Social Connections: Moderately Integrated (05/25/2024)   Social Connection and Isolation Panel    Frequency of Communication with Friends and Family: More than three times a week    Frequency of Social Gatherings with Friends and Family: Never    Attends Religious Services: Never    Database Administrator or Organizations: Yes    Attends Banker Meetings: 1 to 4 times per year    Marital Status: Married  Catering Manager Violence: Not At Risk (05/06/2023)   Humiliation, Afraid, Rape, and Kick  questionnaire    Fear of Current or Ex-Partner: No    Emotionally Abused: No    Physically Abused: No    Sexually Abused: No  Depression (PHQ2-9): Low Risk (06/23/2024)   Depression (PHQ2-9)    PHQ-2 Score: 0  Alcohol Screen: Low Risk (05/06/2023)   Alcohol Screen    Last Alcohol Screening Score (AUDIT): 0  Housing: Unknown (03/22/2024)   Received from [facility]   Epic    Unable to Pay for Housing in the Last Year: Not on  file    Number of Times Moved in the Last Year: Not on file    At any time in the past 12 months, were you homeless or living in a shelter (including now)?: No  Utilities: Not At Risk (05/06/2023)   AHC Utilities    Threatened with loss of utilities: No  Health Literacy: Adequate Health Literacy (05/06/2023)   B1300 Health Literacy    Frequency of need for help with medical instructions: Never     Functional Status Survey:     Family History  Problem Relation Age of Onset   Diabetes Mother    Cataracts Mother    Metabolic syndrome Mother    Blindness Mother        r/t diabetes   Cataracts Father    Glaucoma Father        Retina-Detachment   Atrial fibrillation Father    Diverticulitis Sister    Glaucoma Brother    Diabetes Brother    Cancer Sister        Gallbladder   Cancer Daughter      Health Maintenance  Topic Date Due   FOOT EXAM  04/10/2023   COVID-19 Vaccine (8 - 2025-26 season) 02/14/2024   HEMOGLOBIN A1C  09/20/2024   OPHTHALMOLOGY EXAM  11/03/2024   Medicare Annual Wellness (AWV)  05/25/2025   Colonoscopy  12/22/2026   DTaP/Tdap/Td (3 - Td or Tdap) 03/29/2032   Pneumococcal Vaccine: 50+ Years  Completed   Influenza Vaccine  Completed   Zoster Vaccines- Shingrix  Completed   Meningococcal B Vaccine  Aged Out     Allergies[1]   Outpatient Encounter Medications as of 07/20/2024  Medication Sig   acetaminophen  (TYLENOL ) 500 MG tablet Take 1,000 mg by mouth 2 (two) times daily as needed for moderate pain (pain  score 4-6).   apixaban (ELIQUIS) 5 MG TABS tablet Take 5 mg by mouth 2 (two) times daily.   brimonidine-timolol (COMBIGAN) 0.2-0.5 % ophthalmic solution Place 1 drop into the left eye 2 (two) times daily.   desonide  (DESOWEN ) 0.05 % cream Apply topically every 12 (twelve) hours as needed. Apply to affected area topically every 12 hours as needed for dermatosis   diltiazem (CARDIZEM CD) 120 MG 24 hr capsule Take 120 mg by mouth daily.   dorzolamide (TRUSOPT) 2 % ophthalmic solution Place 1 drop into the left eye 2 (two) times daily.   doxycycline  (VIBRA -TABS) 100 MG tablet Take 100 mg by mouth 2 (two) times daily.   empagliflozin (JARDIANCE) 25 MG TABS tablet Take 25 mg by mouth daily.   finasteride  (PROSCAR ) 5 MG tablet Take 1 tablet by mouth once daily   glipiZIDE (GLUCOTROL) 10 MG tablet Take 10 mg by mouth daily.   hydrocortisone  2.5 % cream APPLY TO THE AFFECTED AREA OF FACE AND EARS EVERY OTHER NIGHT ALTERNATING WITH KETOCONAZOLE  CREAM   insulin degludec (TRESIBA FLEXTOUCH) 200 UNIT/ML FlexTouch Pen Inject 22 Units into the skin in the morning. (Patient taking differently: Inject 16 Units into the skin. Inject 16 unit subcutaneously one time a day for DMII)   loratadine (CLARITIN) 10 MG tablet Take 10 mg by mouth daily.   metFORMIN  (GLUCOPHAGE ) 1000 MG tablet Take 1,000 mg by mouth 2 (two) times daily with a meal. On Monday and Wednesday (Patient taking differently: Take 500 mg by mouth 2 (two) times daily with a meal. TAKES ONCE PER DAY)   mometasone  (ELOCON ) 0.1 % lotion APPLY TOPICALLY TO ITCHY AREAS OF SCALP ONCE TO TWICE DAILY  AS NEEDED (Patient taking differently: Apply to affected area topically at bedtime every Mon, Wed, Fri for itching)   MOUNJARO 2.5 MG/0.5ML Pen SMARTSIG:0.5 Milliliter(s) SUB-Q Once a Week   Multiple Vitamin (MULTIVITAMIN) tablet Take 1 tablet by mouth daily.   omega-3 acid ethyl esters (LOVAZA ) 1 g capsule Take 2 capsules (2 g total) by mouth 2 (two) times  daily.   omeprazole  (PRILOSEC) 20 MG capsule Take 1 capsule (20 mg total) by mouth daily.   rosuvastatin  (CRESTOR ) 10 MG tablet Take 1 tablet (10 mg total) by mouth at bedtime.   telmisartan -hydrochlorothiazide  (MICARDIS  HCT) 80-12.5 MG tablet Take 1 tablet by mouth once daily   diclofenac  Sodium (VOLTAREN ) 1 % GEL Apply 4 g topically 4 (four) times daily. (Patient not taking: Reported on 07/20/2024)   ketoconazole  (NIZORAL ) 2 % cream Apply two grams to the face every other day alternating with hydrocortisone  2.5% cream every other day. (Patient not taking: Reported on 07/20/2024)   ketoconazole  (NIZORAL ) 2 % shampoo Apply 1 Application topically once a week. [place] (Patient not taking: Reported on 07/20/2024)   loperamide (IMODIUM A-D) 2 MG tablet Take 2 mg by mouth as needed for diarrhea or loose stools. (Patient not taking: Reported on 07/20/2024)   tadalafil  (CIALIS ) 20 MG tablet Take 1 tablet (20 mg total) by mouth daily as needed for erectile dysfunction. (Patient not taking: Reported on 07/20/2024)   No facility-administered encounter medications on file as of 07/20/2024.     Review of Systems  Constitutional: Negative.   HENT: Negative.    Eyes: Negative.   Respiratory: Negative.    Cardiovascular: Negative.   Gastrointestinal: Negative.   Endocrine: Negative.   Genitourinary: Negative.   Allergic/Immunologic: Negative.   Neurological: Negative.   Hematological: Negative.   Psychiatric/Behavioral: Negative.    All other systems reviewed and are negative.    Vitals:   07/20/24 1242  BP: 117/69  Pulse: 78  Resp: 18  Temp: 97.8 F (36.6 C)  SpO2: 93%  Weight: 187 lb (84.8 kg)  Height: 6' 3 (1.905 m)   Body mass index is 23.37 kg/m. Physical Exam Vitals and nursing note reviewed.  HENT:     Head: Normocephalic and atraumatic.  Eyes:     General: No scleral icterus. Cardiovascular:     Rate and Rhythm: Normal rate and regular rhythm.  Pulmonary:     Effort: Pulmonary effort  is normal. No respiratory distress.     Breath sounds: Normal breath sounds. No wheezing.  Abdominal:     General: Bowel sounds are normal. There is no distension.     Palpations: Abdomen is soft.     Tenderness: There is no abdominal tenderness.  Musculoskeletal:     Comments: Right upper extremity immobilized in a shoulder immobilizer.  He has an ice pack on his right shoulder.  Skin:    General: Skin is warm and dry.     Capillary Refill: Capillary refill takes less than 2 seconds.     Comments: Left pretibial venous stasis.  He has several healing abrasions with scabs over his left lower extremity.  Some mild edema.  His Ace bandage was removed.  No active drainage.  No acute bleeding.  Neurological:     General: No focal deficit present.     Mental Status: He is alert and oriented to person, place, and time.      Labs reviewed:  Lab Results  Component Value Date   HGBA1C 5.5 03/22/2024   Lab Results  Component Value Date   TSH 1.42 12/05/2015   Lab Results  Component Value Date   VITAMINB12 1,161 (H) 02/22/2023   Lab Results  Component Value Date   FOLATE >24.0 02/22/2023   Lab Results  Component Value Date   IRON 93 02/22/2023   TIBC 290 02/22/2023   FERRITIN 55 02/22/2023     Imaging and Procedures obtained prior to SNF admission: DG Chest 2 View Result Date: 02/23/2024 CLINICAL DATA:  cough fatigue body aches r/o pneumonia. EXAM: CHEST - 2 VIEW COMPARISON:  05/15/2015. FINDINGS: Linear areas of atelectasis/scarring noted at the bilateral lung bases. Bilateral lung fields are otherwise clear. No dense consolidation or lung collapse. Bilateral costophrenic angles are clear. Normal cardio-mediastinal silhouette. No acute osseous abnormalities. The soft tissues are within normal limits. IMPRESSION: *No active cardiopulmonary disease. Electronically Signed   By: Ree Molt M.D.   On: 02/23/2024 14:20     Assessment/Plan S/P reverse total shoulder arthroplasty,  right Continue with occupational and physical therapy.  Pain is well-controlled.  He is doxycycline  for surgical prophylaxis for 14 days per orthopedics.  Benign essential HTN Continue with Cardizem CD 120 mg daily, telmisartan /hydrochlorothiazide  80/12.51 p.o. daily  Type 2 diabetes mellitus with renal manifestations (HCC) Continue with Januvia 25 mg daily, glipizide 10 mg daily, Tresiba 60 units daily, metformin  1000 mg daily  Benign prostatic hyperplasia without lower urinary tract symptoms Continue Proscar  5 mg daily  Hyperlipidemia due to type 2 diabetes mellitus (HCC) Continue Crestor  10 mg daily  Chronic atrial fibrillation (HCC) Remains on Eliquis 5 mg twice daily and Cardizem CD to 40 mg daily  Anticoagulated on Eliquis On Eliquis twice daily for chronic A-fib    Family/ staff Communication: discussed with nursing. Pt's niece aware of pt's admission.   Camellia Door, DO [facility] [phone]     [1]  Allergies Allergen Reactions   Demerol [Meperidine] Other (See Comments) and Anaphylaxis    MAKES BLOOD PRESSURE BOTTOM OUT hypotension   Metoprolol Shortness Of Breath   Dilaudid [Hydromorphone Hcl]     MAKES BLOOD PRESSURE BOTTOM OUT   Actos [Pioglitazone] Other (See Comments) and Hives   Dapagliflozin Rash   Penicillins Rash

## 2024-07-20 NOTE — Assessment & Plan Note (Signed)
"  Continue Crestor  10 mg daily   "

## 2024-07-20 NOTE — Assessment & Plan Note (Signed)
 Continue Proscar 5 mg daily.

## 2024-07-20 NOTE — Assessment & Plan Note (Signed)
 Remains on Eliquis 5 mg twice daily and Cardizem CD to 40 mg daily

## 2024-08-09 ENCOUNTER — Encounter: Admitting: Physician Assistant

## 2024-10-25 ENCOUNTER — Ambulatory Visit: Admitting: Dermatology

## 2025-05-31 ENCOUNTER — Ambulatory Visit
